# Patient Record
Sex: Female | Born: 1935
Health system: Southern US, Community
[De-identification: ages and names within clinical notes are randomized; demographics above are authoritative.]

## PROBLEM LIST (undated history)

## (undated) DIAGNOSIS — R32 Unspecified urinary incontinence: Secondary | ICD-10-CM

## (undated) DIAGNOSIS — N3281 Overactive bladder: Secondary | ICD-10-CM

## (undated) DIAGNOSIS — M48061 Spinal stenosis, lumbar region without neurogenic claudication: Secondary | ICD-10-CM

## (undated) DIAGNOSIS — I4510 Unspecified right bundle-branch block: Secondary | ICD-10-CM

## (undated) DIAGNOSIS — M1990 Unspecified osteoarthritis, unspecified site: Secondary | ICD-10-CM

## (undated) DIAGNOSIS — R21 Rash and other nonspecific skin eruption: Secondary | ICD-10-CM

## (undated) DIAGNOSIS — R413 Other amnesia: Secondary | ICD-10-CM

## (undated) DIAGNOSIS — G729 Myopathy, unspecified: Secondary | ICD-10-CM

## (undated) DIAGNOSIS — N393 Stress incontinence (female) (male): Secondary | ICD-10-CM

## (undated) DIAGNOSIS — R3915 Urgency of urination: Secondary | ICD-10-CM

## (undated) DIAGNOSIS — R252 Cramp and spasm: Secondary | ICD-10-CM

## (undated) DIAGNOSIS — K5900 Constipation, unspecified: Secondary | ICD-10-CM

## (undated) DIAGNOSIS — L821 Other seborrheic keratosis: Secondary | ICD-10-CM

## (undated) DIAGNOSIS — H8110 Benign paroxysmal vertigo, unspecified ear: Secondary | ICD-10-CM

## (undated) DIAGNOSIS — H9319 Tinnitus, unspecified ear: Secondary | ICD-10-CM

## (undated) DIAGNOSIS — R42 Dizziness and giddiness: Secondary | ICD-10-CM

## (undated) DIAGNOSIS — M899 Disorder of bone, unspecified: Secondary | ICD-10-CM

## (undated) DIAGNOSIS — B379 Candidiasis, unspecified: Secondary | ICD-10-CM

## (undated) DIAGNOSIS — R0789 Other chest pain: Secondary | ICD-10-CM

## (undated) DIAGNOSIS — M1710 Unilateral primary osteoarthritis, unspecified knee: Secondary | ICD-10-CM

## (undated) DIAGNOSIS — IMO0002 Reserved for concepts with insufficient information to code with codable children: Secondary | ICD-10-CM

## (undated) DIAGNOSIS — J301 Allergic rhinitis due to pollen: Secondary | ICD-10-CM

## (undated) DIAGNOSIS — R7309 Other abnormal glucose: Secondary | ICD-10-CM

## (undated) DIAGNOSIS — G4700 Insomnia, unspecified: Secondary | ICD-10-CM

## (undated) DIAGNOSIS — M62838 Other muscle spasm: Secondary | ICD-10-CM

## (undated) DIAGNOSIS — D649 Anemia, unspecified: Secondary | ICD-10-CM

## (undated) DIAGNOSIS — N63 Unspecified lump in unspecified breast: Secondary | ICD-10-CM

## (undated) DIAGNOSIS — M949 Disorder of cartilage, unspecified: Secondary | ICD-10-CM

## (undated) DIAGNOSIS — M948X9 Other specified disorders of cartilage, unspecified sites: Secondary | ICD-10-CM

## (undated) DIAGNOSIS — M25562 Pain in left knee: Secondary | ICD-10-CM

## (undated) DIAGNOSIS — R350 Frequency of micturition: Secondary | ICD-10-CM

## (undated) DIAGNOSIS — M545 Low back pain: Secondary | ICD-10-CM

## (undated) DIAGNOSIS — IMO0001 Reserved for inherently not codable concepts without codable children: Secondary | ICD-10-CM

## (undated) HISTORY — PX: CLEFT LIP REPAIR: SUR1164

## (undated) HISTORY — PX: EYE SURGERY: SHX253

## (undated) HISTORY — DX: Reserved for concepts with insufficient information to code with codable children: IMO0002

## (undated) HISTORY — PX: BACK SURGERY: SHX140

## (undated) HISTORY — DX: Unilateral primary osteoarthritis, unspecified knee: M17.10

## (undated) HISTORY — PX: BREAST SURGERY: SHX581

## (undated) HISTORY — DX: Myopathy, unspecified: G72.9

## (undated) HISTORY — DX: Other abnormal glucose: R73.09

## (undated) HISTORY — DX: Constipation, unspecified: K59.00

## (undated) HISTORY — DX: Benign paroxysmal vertigo, unspecified ear: H81.10

## (undated) HISTORY — DX: Tinnitus, unspecified ear: H93.19

## (undated) HISTORY — DX: Frequency of micturition: R35.0

## (undated) HISTORY — DX: Urgency of urination: R39.15

## (undated) HISTORY — DX: Stress incontinence (female) (male): N39.3

## (undated) HISTORY — DX: Disorder of cartilage, unspecified: M94.9

## (undated) HISTORY — DX: Unspecified urinary incontinence: R32

## (undated) HISTORY — PX: ABDOMINAL HYSTERECTOMY: SHX81

## (undated) HISTORY — DX: Allergic rhinitis due to pollen: J30.1

## (undated) HISTORY — DX: Other seborrheic keratosis: L82.1

## (undated) HISTORY — DX: Dizziness and giddiness: R42

## (undated) HISTORY — DX: Insomnia, unspecified: G47.00

## (undated) HISTORY — DX: Cramp and spasm: R25.2

## (undated) HISTORY — DX: Other amnesia: R41.3

## (undated) HISTORY — DX: Unspecified lump in unspecified breast: N63.0

## (undated) HISTORY — DX: Rash and other nonspecific skin eruption: R21

## (undated) HISTORY — DX: Unspecified right bundle-branch block: I45.10

## (undated) HISTORY — DX: Low back pain: M54.5

## (undated) HISTORY — DX: Pain in left knee: M25.562

## (undated) HISTORY — DX: Other muscle spasm: M62.838

## (undated) HISTORY — DX: Anemia, unspecified: D64.9

## (undated) HISTORY — DX: Candidiasis, unspecified: B37.9

## (undated) HISTORY — DX: Other chest pain: R07.89

## (undated) HISTORY — DX: Other specified disorders of cartilage, unspecified sites: M94.8X9

## (undated) HISTORY — DX: Reserved for inherently not codable concepts without codable children: IMO0001

## (undated) HISTORY — DX: Spinal stenosis, lumbar region without neurogenic claudication: M48.061

## (undated) HISTORY — DX: Disorder of bone, unspecified: M89.9

---
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
## 1991-08-28 HISTORY — PX: BREAST LUMPECTOMY: SHX2

## 1998-09-15 ENCOUNTER — Encounter: Admission: RE | Admit: 1998-09-15 | Discharge: 1998-12-14 | Payer: Self-pay | Admitting: *Deleted

## 1998-09-16 ENCOUNTER — Other Ambulatory Visit: Admission: RE | Admit: 1998-09-16 | Discharge: 1998-09-16 | Payer: Self-pay | Admitting: *Deleted

## 1999-02-01 ENCOUNTER — Other Ambulatory Visit: Admission: RE | Admit: 1999-02-01 | Discharge: 1999-02-01 | Payer: Self-pay | Admitting: *Deleted

## 1999-03-17 ENCOUNTER — Encounter: Payer: Self-pay | Admitting: Specialist

## 1999-03-17 ENCOUNTER — Ambulatory Visit (HOSPITAL_COMMUNITY): Admission: RE | Admit: 1999-03-17 | Discharge: 1999-03-17 | Payer: Self-pay | Admitting: Specialist

## 1999-04-03 ENCOUNTER — Encounter: Payer: Self-pay | Admitting: Specialist

## 1999-04-03 ENCOUNTER — Ambulatory Visit (HOSPITAL_COMMUNITY): Admission: RE | Admit: 1999-04-03 | Discharge: 1999-04-03 | Payer: Self-pay | Admitting: Specialist

## 1999-04-17 ENCOUNTER — Ambulatory Visit (HOSPITAL_COMMUNITY): Admission: RE | Admit: 1999-04-17 | Discharge: 1999-04-17 | Payer: Self-pay | Admitting: Specialist

## 1999-04-17 ENCOUNTER — Encounter: Payer: Self-pay | Admitting: Specialist

## 1999-09-26 ENCOUNTER — Encounter: Payer: Self-pay | Admitting: *Deleted

## 1999-09-26 ENCOUNTER — Encounter: Admission: RE | Admit: 1999-09-26 | Discharge: 1999-09-26 | Payer: Self-pay | Admitting: *Deleted

## 1999-10-10 ENCOUNTER — Encounter: Payer: Self-pay | Admitting: *Deleted

## 1999-10-10 ENCOUNTER — Encounter: Admission: RE | Admit: 1999-10-10 | Discharge: 1999-10-10 | Payer: Self-pay | Admitting: *Deleted

## 1999-10-24 ENCOUNTER — Ambulatory Visit (HOSPITAL_COMMUNITY): Admission: RE | Admit: 1999-10-24 | Discharge: 1999-10-24 | Payer: Self-pay | Admitting: Specialist

## 1999-10-24 ENCOUNTER — Encounter: Payer: Self-pay | Admitting: Specialist

## 1999-11-01 ENCOUNTER — Other Ambulatory Visit: Admission: RE | Admit: 1999-11-01 | Discharge: 1999-11-01 | Payer: Self-pay | Admitting: *Deleted

## 1999-11-08 ENCOUNTER — Ambulatory Visit (HOSPITAL_COMMUNITY): Admission: RE | Admit: 1999-11-08 | Discharge: 1999-11-08 | Payer: Self-pay | Admitting: Specialist

## 1999-11-08 ENCOUNTER — Encounter: Payer: Self-pay | Admitting: Specialist

## 1999-11-23 ENCOUNTER — Ambulatory Visit (HOSPITAL_COMMUNITY): Admission: RE | Admit: 1999-11-23 | Discharge: 1999-11-23 | Payer: Self-pay | Admitting: Specialist

## 1999-11-23 ENCOUNTER — Encounter: Payer: Self-pay | Admitting: Specialist

## 2000-10-17 ENCOUNTER — Encounter: Admission: RE | Admit: 2000-10-17 | Discharge: 2000-10-17 | Payer: Self-pay | Admitting: General Surgery

## 2000-10-17 ENCOUNTER — Encounter (INDEPENDENT_AMBULATORY_CARE_PROVIDER_SITE_OTHER): Payer: Self-pay | Admitting: *Deleted

## 2000-10-17 ENCOUNTER — Encounter: Payer: Self-pay | Admitting: General Surgery

## 2000-10-17 ENCOUNTER — Other Ambulatory Visit: Admission: RE | Admit: 2000-10-17 | Discharge: 2000-10-17 | Payer: Self-pay | Admitting: General Surgery

## 2003-05-12 ENCOUNTER — Encounter: Admission: RE | Admit: 2003-05-12 | Discharge: 2003-05-12 | Payer: Self-pay | Admitting: Specialist

## 2003-06-04 ENCOUNTER — Encounter: Admission: RE | Admit: 2003-06-04 | Discharge: 2003-06-04 | Payer: Self-pay | Admitting: Specialist

## 2003-06-04 ENCOUNTER — Encounter: Payer: Self-pay | Admitting: Specialist

## 2003-06-22 ENCOUNTER — Encounter: Admission: RE | Admit: 2003-06-22 | Discharge: 2003-06-22 | Payer: Self-pay | Admitting: Specialist

## 2003-09-27 ENCOUNTER — Encounter: Admission: RE | Admit: 2003-09-27 | Discharge: 2003-09-27 | Payer: Self-pay | Admitting: Specialist

## 2005-04-25 ENCOUNTER — Ambulatory Visit (HOSPITAL_COMMUNITY): Admission: RE | Admit: 2005-04-25 | Discharge: 2005-04-25 | Payer: Self-pay | Admitting: Gastroenterology

## 2005-06-12 ENCOUNTER — Encounter: Admission: RE | Admit: 2005-06-12 | Discharge: 2005-06-12 | Payer: Self-pay | Admitting: Specialist

## 2005-06-27 ENCOUNTER — Encounter: Admission: RE | Admit: 2005-06-27 | Discharge: 2005-06-27 | Payer: Self-pay | Admitting: Specialist

## 2005-08-13 ENCOUNTER — Encounter: Admission: RE | Admit: 2005-08-13 | Discharge: 2005-08-13 | Payer: Self-pay | Admitting: Internal Medicine

## 2005-08-17 ENCOUNTER — Encounter: Admission: RE | Admit: 2005-08-17 | Discharge: 2005-08-17 | Payer: Self-pay | Admitting: Specialist

## 2005-12-05 ENCOUNTER — Encounter: Admission: RE | Admit: 2005-12-05 | Discharge: 2005-12-05 | Payer: Self-pay | Admitting: Specialist

## 2006-07-08 ENCOUNTER — Encounter: Admission: RE | Admit: 2006-07-08 | Discharge: 2006-07-08 | Payer: Self-pay | Admitting: Specialist

## 2007-04-30 ENCOUNTER — Inpatient Hospital Stay (HOSPITAL_COMMUNITY): Admission: RE | Admit: 2007-04-30 | Discharge: 2007-05-04 | Payer: Self-pay | Admitting: Specialist

## 2009-07-01 DIAGNOSIS — M545 Low back pain, unspecified: Secondary | ICD-10-CM

## 2009-07-01 DIAGNOSIS — J301 Allergic rhinitis due to pollen: Secondary | ICD-10-CM

## 2009-07-01 HISTORY — DX: Allergic rhinitis due to pollen: J30.1

## 2009-07-01 HISTORY — DX: Low back pain, unspecified: M54.50

## 2009-10-13 ENCOUNTER — Encounter: Admission: RE | Admit: 2009-10-13 | Discharge: 2009-10-13 | Payer: Self-pay | Admitting: Specialist

## 2009-10-19 ENCOUNTER — Ambulatory Visit (HOSPITAL_BASED_OUTPATIENT_CLINIC_OR_DEPARTMENT_OTHER): Admission: RE | Admit: 2009-10-19 | Discharge: 2009-10-19 | Payer: Self-pay | Admitting: Specialist

## 2010-02-06 ENCOUNTER — Ambulatory Visit (HOSPITAL_BASED_OUTPATIENT_CLINIC_OR_DEPARTMENT_OTHER): Admission: RE | Admit: 2010-02-06 | Discharge: 2010-02-06 | Payer: Self-pay | Admitting: Specialist

## 2010-03-23 DIAGNOSIS — R7309 Other abnormal glucose: Secondary | ICD-10-CM

## 2010-03-23 HISTORY — DX: Other abnormal glucose: R73.09

## 2010-09-07 DIAGNOSIS — D649 Anemia, unspecified: Secondary | ICD-10-CM

## 2010-09-07 DIAGNOSIS — G4700 Insomnia, unspecified: Secondary | ICD-10-CM

## 2010-09-07 HISTORY — DX: Anemia, unspecified: D64.9

## 2010-09-07 HISTORY — DX: Insomnia, unspecified: G47.00

## 2010-11-10 DIAGNOSIS — M899 Disorder of bone, unspecified: Secondary | ICD-10-CM

## 2010-11-10 DIAGNOSIS — R42 Dizziness and giddiness: Secondary | ICD-10-CM

## 2010-11-10 HISTORY — DX: Dizziness and giddiness: R42

## 2010-11-10 HISTORY — DX: Disorder of bone, unspecified: M89.9

## 2010-11-13 LAB — POCT HEMOGLOBIN-HEMACUE: Hemoglobin: 12.3 g/dL (ref 12.0–15.0)

## 2010-11-15 LAB — POCT HEMOGLOBIN-HEMACUE: Hemoglobin: 13 g/dL (ref 12.0–15.0)

## 2011-01-09 NOTE — H&P (Signed)
NAME:  Janet Hernandez, SHERR NO.:  [id number]   MEDICAL RECORD NO.:  [id number]           PATIENT TYPE:   LOCATION:                                 FACILITY:   PHYSICIAN:  Jene Every, M.D.    DATE OF BIRTH:  Aug 15, 1930   DATE OF ADMISSION:  DATE OF DISCHARGE:                              HISTORY & PHYSICAL   CHIEF COMPLAINT:  Back and mainly right lower extremity pain with  occasional left leg pain.   HISTORY:  Janet Hernandez is a pleasant 77 year old female with a history of  longstanding low back pain.  She has been treated in the past with  multiple injections, physical therapy as well as manipulation which has  given her short-term relief.  She has now noted disabling symptoms,  mostly when she stands for extended periods of time.  She has undergone  multiple MRI studies which show degenerative spondylolisthesis at L4-5  with associated stenosis as well as facet arthropathy.  Dr. Shelle Iron felt  that the patient would benefit from decompression with instrumented  fusion.  She also had a second opinion with Dr. Venita Lick who agreed  with this.  The risks and benefits of this surgery were discussed with  the patient, medical clearance was obtained.  She does wish to proceed.   MEDICAL HISTORY:  Sent for hyperlipidemia, overactive bladder.   CURRENT MEDICATIONS:  1. Meloxicam 7.5 mg one p.o. daily,.  2. Vesicare 5 mg one p.o. daily.   ALLERGIES:  None.   PREVIOUS SURGICAL HISTORY INCLUDES:  1. Cleft palate repair in 1933.  2. Appendectomy in 1956.  3. Removal of fallopian tube and ovary in 1979.  4. Lumpectomy of the breast in 1993 which was benign.   SOCIAL HISTORY:  The patient is divorced.  She is retired.  She denies  any tobacco consumption, drinks occasional wine.  She has three sons.  She lives in the 3-story town home with two sets of stairs.  Her friend  will take care of her at time of discharge, allowing her stay at her  house.   PRIMARY CARE  PHYSICIAN:  Ralene Ok, M.D.   FAMILY HISTORY:  Mother lived until she was 5 years old, died of old  age.   REVIEW OF SYSTEMS:  GENERAL: The patient denies any fever, chills, night  sweats, or bleeding tendencies.  CENTRAL NERVOUS SYSTEM:  No blurred  double vision, seizure, headache or paralysis.  RESPIRATORY: No  shortness of breath, productive cough or hemoptysis.  CARDIOVASCULAR:  No chest pain, angina, nor orthopnea.  GASTROINTESTINAL:  No nausea,  vomiting, constipation, melena, nor bloody stools.  GENITOURINARY:  The  patient does note urinary incontinence.  She has no urinary frequency as  well as excess urination at night.  She denies any burning or change in  these symptoms.  MUSCULOSKELETAL:  Pertinent to HPI.   PHYSICAL EXAM:  VITAL SIGNS:  Pulse 60, respiratory rate 16, BP is  122/88.  Weight is 172.  GENERAL:  This is a well-developed female seen upright in no acute  distress.  She walks with a  slightly antalgic gait.  HEENT: Atraumatic, normocephalic.  Pupils equal round and reactive to  light.  EOMs intact.  NECK:  Supple.  No lymphadenopathy.  CHEST:  Clear to auscultation bilaterally.  No rhonchi, wheezes or  rales.  BREASTS/GENITOURINARY:  Not examined, per HPI.  HEART:  Regular rate and rhythm without murmurs, gallops or rubs.  ABDOMEN:  Soft, nontender, nondistended.  Bowel sounds x4.  SKIN:  No rashes or lesions are noted.  BACK:  The patient does have pain with forward flexion and extension of  the lumbar spine.  She can straight leg raise bilaterally; does produce  low back pain, measures 5/5.   IMPRESSION:  Degenerative spondylolisthesis at L4-5 was associated  facette arthropathy.   PLAN:  The patient will be admitted to [facility] to undergo  lumbar decompression and fusion at L4-5 with facette set screw fixation.      Roma Schanz, P.A.      Jene Every, M.D.  Electronically Signed    CS/MEDQ  D:  04/23/2007  T:   04/24/2007  Job:  [id number]

## 2011-01-09 NOTE — Op Note (Signed)
NAME:  Janet Hernandez, Janet Hernandez NO.:  [id number]   MEDICAL RECORD NO.:  [id number]          PATIENT TYPE:  INP   LOCATION:  X003                         FACILITY:  [facility]   PHYSICIAN:  Jene Every, M.D.    DATE OF BIRTH:  1930/03/20   DATE OF PROCEDURE:  04/24/2007  DATE OF DISCHARGE:                               OPERATIVE REPORT   PREOPERATIVE DIAGNOSES:  Spinal stenosis, spinal listhesis at L4-5.   POSTOPERATIVE DIAGNOSES:  Spinal stenosis, spinal listhesis at L4-5.   PROCEDURE:  1. Central decompression by L4 laminectomy and partial L5 laminectomy.  2. Posterolateral fusion utilizing autologous and active fused bone      graft.  3. Posterior instrumentation at L4-5 facet bilaterally utilizing L4-5      facet screws with autologous and allograft bone graft.   ANESTHESIA:  General.   ASSISTANT:  Dahari D. Shon Baton, MD.   BRIEF HISTORY:  This is a 77 year old female with neurogenic  claudication secondary to spinal stenosis, spinal listhesis,  predominantly back pain and claudication. She had been refractory to  conservative treatment including epidural steroid injection and activity  modification. She had leg pain and mechanical back pain and spinal  listhesis. She was indicated for her decompression by central  laminectomy and then we discussed stabilization of the  L4-5 facets with  facet screws and lateral mass bone graft. I did not feel that a TLIF  pedicle screw instrumentation was necessary at this point and that  following the decompression __facet________ fixation and lateral mass  fusion would be appropriate for this 77 year old. The risks and benefits  were discussed including bleeding, infection, injury to neurovascular  structures, CSF leakage, epidural fibrosis, adjacent segment disease and  need for fusion in the future,  anesthetic complications, etc.   TECHNIQUE:  The patient in the supine position and after the  administration of adequate general  anesthesia and 1 gram of Kefzol, she  was placed prone on the Wilson frame, [place] table, lumbar region  prepped and draped in the usual sterile fashion under C-arm. We  localized the 4-5 space with a spinal needle and made an incision after  appropriately prepping and draping. From the spinous process of 3 to  beneath the spinous process of 5, the subcutaneous tissue was dissected,  electrocautery was utilized to achieve hemostasis. The dorsolumbar  fascia identified, divided in line with the skin incision. The  paraspinous muscle elevated from the lamina of 4, 5 and partially of 3.  We confirmed this space with a Kocher on the spinal stenosis of 4. First  we removed the spinal stenosis of 4 and partially of 5 and morcellized  the cancellous bone for later bone graft. Following this, we draped the  operating microscope and brought it into the surgical field and  performed a central decompression by hemilaminectomy with a 2 and 3 mm  Kerrison. She had severe stenosis centrally and laterally. She had  significant attenuation of the thecal sac with adhesions. This required  a meticulous dissection and decompression to avoid a dural rent. We  decompressed gently to the medial border of  the pedicle preserving the  majority of the lateral aspect of the facet. We performed foraminotomies  of L5. Facet arthropathy was noted, facet synovial cysts were extending  into the foramen, these were excised as well particularly on the left.  The facets were decapsulated, curetted for apparent bed for bone  grafting. We identified the spinous processes of 4 and 5, these were  curetted as well as the pars. The pars was conserved. We used the  operating microscope when we were performing this after foraminotomies  with the hockey stick probe. This passed freely in the foramen of 4 and  5. There was no disk herniation noted, good restoration of the thecal  sac, no evidence of CSF leakage. Bipolar  electrocautery was utilized to  achieve hemostasis. The neural elements were well protected and a  satisfactory decompression. We placed autologous and active fused bone  graft into the lateral masses and they were packed accordingly and also  into the facet joints. We then proceeded with a facet screw fixation.  Under x-ray, a facet screw was place from cephalad to caudad, medial to  slightly lateral under x-ray directly into the pedicle through the  facet. A 35-mm screw was utilized. After this was drilled with a  guidepin and overdrilled with a cannulated drill to a 30 and then we  probed the drill hole __to________ drill bit  depth. Also  __placed________ probe out __into________  foramen of 4 and 5 with no  breakthrough appreciated. The 30 mm __________  screws with the  appropriate washings were then advanced first on the right side with  excellent purchase with correction of the facet with excretion of  hematoma and __allo________  bone graft performed similarly on the  contralateral side. Began probing the drill hole __________ bone.  Examined the pedicle above and below the foramen __________ vertex of  the screw that was inserted, again excellent purchase here, excretion of  hematoma and tapping of the bone graft. Exam of the __pars________  was  found to be intact after decompression of the AP and lateral planes with  excellent placement of the screws. A hockey stick probe placed in the  foramen of 5 and 4. They are widely patent, there was no residual  lateral residual stenosis noted, no ___epidural_______ direct bleeding.  We copiously irrigated the wound with antibiotic irrigation. There was a  Hemovac brought out through a lateral stab wound in the skin and  thrombin soaked Gelfoam was placed in the laminotomy defect.  Electrocautery was utilized to achieve hemostasis. The dorsolumbar  fascia was reapproximated with #1 Vicryl in a figure-of-eight suture.  The subcutaneous  tissue was reapproximated with 2-0 Vicryl simple  suture, skin was reapproximated with staples. The wound was dressed  sterilely. She was placed supine on the hospital bed, extubated without  difficulty and transported to the recovery room in satisfactory  condition.   The patient tolerated the procedure well, there were no apparent  complications. Blood loss during this procedure was 300 mL.      Jene Every, M.D.  Electronically Signed     JB/MEDQ  D:  04/30/2007  T:  04/30/2007  Job:  [id number]

## 2011-01-11 DIAGNOSIS — R252 Cramp and spasm: Secondary | ICD-10-CM

## 2011-01-11 DIAGNOSIS — M62838 Other muscle spasm: Secondary | ICD-10-CM

## 2011-01-11 HISTORY — DX: Cramp and spasm: R25.2

## 2011-01-11 HISTORY — DX: Other muscle spasm: M62.838

## 2011-01-12 NOTE — Discharge Summary (Signed)
NAME:  Janet Hernandez, Janet Hernandez             ACCOUNT NO.:  [id number]   MEDICAL RECORD NO.:  [id number]          PATIENT TYPE:  INP   LOCATION:  1526                         FACILITY:  [facility]   PHYSICIAN:  Jene Every, M.D.    DATE OF BIRTH:  1930/05/26   DATE OF ADMISSION:  04/30/2007  DATE OF DISCHARGE:  05/04/2007                               DISCHARGE SUMMARY   ADMISSION DIAGNOSES:  1. Degenerative spondylolisthesis, L4-5, with facet arthroscopy.  2. Hyperlipidemia.  3. Overactive bladder.   DISCHARGE DIAGNOSES:  1. Degenerative spondylolisthesis, L4-5, with facet arthroscopy,      status post decompression at the fixation at L4-5.  2. Hyperlipidemia.  3. Overactive bladder.   HISTORY:  Janet Hernandez is a pleasant 77 year old female who is a  longstanding patient to our practice.  She has been treated over the  years for complaints of back and lower extremity pain with injections as  well as physical therapy.  She has also had chiropractor manipulation,  which gave her short-term relief of her symptoms.  She states at this  point the pain has become disabling, more when she stands for extended  periods of time.  MRI study does show degenerative spondylolisthesis at  L4-5 with associated stenosis as well as facet arthropathy.  It is felt  by Dr. Jillyn Hidden as well as Dr. Venita Lick that she would benefit from a  decompression and facet fusion.  The risks and benefits of this were  discussed, medical clearance was obtained.  The patient does elect to  proceed.   PROCEDURES:  The patient was taken to the OR on September 3 to undergo  decompression with facet fusion at L4-5 with allograft and autograft  bone.  Surgeon:  Jene Every, MD.  Assistant:  Donn Pierini. Shon Baton, MD.  Anesthesia:  General.  Complications:  None.   CONSULTANT:  PT, OT, case management.   LABORATORY VALUES:  Preoperative CBC showed a white cell count of 7.3,  hemoglobin 14.6, hematocrit 42.5.  These were monitored  throughout the  hospital course and remained within normal range.  Hemoglobin at time of  discharge was 12.5, hematocrit 36.2.  Coagulation studies done  preoperatively showed PT of 12.7, INR of 0.9.  Routine chemistries done  preoperatively showed sodium 140, potassium 4.5, glucose of 111 and BUN  of 25 with creatinine 0.78.  This was repeated postoperatively.  Sodium  remained normal at 138, potassium 3.8, glucose slightly elevated at 120,  BUN was normal at 7, creatinine remained stable at 0.71.  Preoperative  urinalysis was negative.  Blood type was O+.  Preoperative EKG showed  normal sinus rhythm.  There was noted incomplete right bundle branch  block.  Chest x-ray preoperatively showed no acute findings.   HOSPITAL COURSE:  The patient was admitted, taken to the operating room,  and underwent the above-stated procedure without difficulty.  She was  then transferred to the PACU eventually to the orthopedic floor for  postoperative care.  Intraoperatively one Hemovac drain was placed.  The  patient was placed on PCA analgesics for pain relief.  On postop day #1  the patient was doing fairly well.  She noted decreased lower extremity  pain, low back pain as expected.  Hemovac was continued secondary to  persistent drainage.  Lab values within normal range.  Foley remained in  place.  The patient was voiding without difficulty and the patient did  fairly well with physical therapy.  Postop day #2 the patient was doing  better.  She was, however, complaining of some nausea secondary to pain  medication.  PCA was discontinued.  She was voiding without difficulty.  She did note flatus.  Diet had been advanced to clear liquid.  Hemovac  was discontinued.  Dressing was changed.  Neurovascular and motor  function remained intact to the lower extremities and PT, OT was  continued.  Discharge planning was initiated.  The patient continued to  do fairly well.  She did note complaints of some  right knee pain.  She  has a known history of osteoarthritis and this was treated  conservatively.  As far as her back, she continued to progress with  physical therapy.  Postop day #4 it was felt the patient was stable to  be discharged home.  Pain was controlled.  She was passing flatus  without difficulty.  All home health arrangements had been made.   DISPOSITION:  The patient discharged home with home health PT, OT and  all equipment needs.  She is to follow up with Dr. Shelle Iron in  approximately 10-14 days for x-ray as well as suture removal.   WOUND CARE:  She should change her dressing daily.  It is okay for her  to shower in 72 hours.   ACTIVITY:  She is to walk as tolerated utilizing her walker.  No  bending, twisting or lifting, no driving for 2 weeks, no sexual activity  for 2-4 weeks.   DIET:  As tolerated.   DISCHARGE MEDICATIONS:  1. Vitamin C 500 mg daily.  2. Vicodin one to two p.o. q.4-6h. p.r.n. pain.  3. Mobic as needed.   The patient is to utilize her brace when walking, avoid extension.   CONDITION ON DISCHARGE:  Stable.   FINAL DIAGNOSIS:  Doing well status post decompression and facet fusion  at L4-5.      Roma Schanz, P.A.      Jene Every, M.D.  Electronically Signed    CS/MEDQ  D:  05/15/2007  T:  05/15/2007  Job:  [id number]

## 2011-01-12 NOTE — Op Note (Signed)
NAME:  Janet Hernandez, Janet Hernandez             ACCOUNT NO.:  [id number]   MEDICAL RECORD NO.:  [id number]          PATIENT TYPE:  AMB   LOCATION:  ENDO                         FACILITY:  [facility]   PHYSICIAN:  James L. Malon Kindle., M.D.DATE OF BIRTH:  11-20-1929   DATE OF PROCEDURE:  04/25/2005  DATE OF DISCHARGE:                                 OPERATIVE REPORT   PROCEDURE:  Colonoscopy.   MEDICATIONS:  Fentanyl 50 mcg, Versed 5 mg IV.   INDICATIONS FOR PROCEDURE:  Colon cancer screening.   DESCRIPTION OF PROCEDURE:  The procedure explained to the patient including  the risks and benefits, consent obtained. In the left lateral decubitus  position, the pediatric adjustable scope was inserted and advanced. The  patient had a somewhat  long tortuous colon. Position changes and abdominal  pressure were required and we were eventually able to reach the cecum,  ileocecal valve and appendiceal orifice seen. The scope withdrawn, the colon  carefully examined on withdrawal. The mucosal pattern was normal throughout.  There were only scattered diverticula in the sigmoid colon. No polyps were  seen throughout. The rectum was free of any significant lesions. The scope  was withdrawn. The patient tolerated the procedure well.   ASSESSMENT:  Normal screening colonoscopy, V76.51.   PLAN:  Will recommend yearly Hemoccult's and possibly repeat colonoscopy in  10 years depending on clinical condition.           ______________________________  Llana Aliment Malon Kindle., M.D.     Waldron Session  D:  04/25/2005  T:  04/25/2005  Job:  [id number]   cc:   Wilson Singer, M.D.  [address]  [place]  [place] [address]  Fax: [phone]

## 2011-05-17 DIAGNOSIS — L821 Other seborrheic keratosis: Secondary | ICD-10-CM

## 2011-05-17 DIAGNOSIS — IMO0001 Reserved for inherently not codable concepts without codable children: Secondary | ICD-10-CM

## 2011-05-17 HISTORY — DX: Reserved for inherently not codable concepts without codable children: IMO0001

## 2011-05-17 HISTORY — DX: Other seborrheic keratosis: L82.1

## 2011-06-08 LAB — CBC
HCT: 42.5
Hemoglobin: 14.6
MCV: 84.7
MCV: 84.4
RBC: 4.28
RBC: 5.03
WBC: 9.5
WBC: 7.3

## 2011-06-08 LAB — APTT: aPTT: 21 — ABNORMAL LOW

## 2011-06-08 LAB — URINALYSIS, ROUTINE W REFLEX MICROSCOPIC
Glucose, UA: NEGATIVE
Ketones, ur: NEGATIVE
Specific Gravity, Urine: 1.027
pH: 6

## 2011-06-08 LAB — BASIC METABOLIC PANEL
BUN: 25 — ABNORMAL HIGH
Chloride: 107
Chloride: 109
Creatinine, Ser: 0.71
GFR calc Af Amer: >60
GFR calc Af Amer: >60
Potassium: 3.8
Potassium: 4.5
Sodium: 140

## 2011-06-08 LAB — TYPE AND SCREEN
ABO/RH(D): O POS
Antibody Screen: NEGATIVE

## 2011-06-08 LAB — HEMOGLOBIN AND HEMATOCRIT, BLOOD: HCT: 35.5 — ABNORMAL LOW

## 2011-06-08 LAB — ABO/RH: ABO/RH(D): O POS

## 2011-07-26 ENCOUNTER — Other Ambulatory Visit: Payer: Self-pay | Admitting: Internal Medicine

## 2011-07-26 DIAGNOSIS — R252 Cramp and spasm: Secondary | ICD-10-CM

## 2011-08-03 ENCOUNTER — Ambulatory Visit
Admission: RE | Admit: 2011-08-03 | Discharge: 2011-08-03 | Disposition: A | Discharge status: Home / Self Care | Payer: Medicare Other | Source: Ambulatory Visit | Attending: Internal Medicine | Admitting: Internal Medicine

## 2011-08-03 DIAGNOSIS — R252 Cramp and spasm: Secondary | ICD-10-CM

## 2011-08-29 DIAGNOSIS — M1710 Unilateral primary osteoarthritis, unspecified knee: Secondary | ICD-10-CM | POA: Diagnosis not present

## 2011-08-30 DIAGNOSIS — L821 Other seborrheic keratosis: Secondary | ICD-10-CM | POA: Diagnosis not present

## 2011-08-30 DIAGNOSIS — M1710 Unilateral primary osteoarthritis, unspecified knee: Secondary | ICD-10-CM | POA: Diagnosis not present

## 2011-08-30 DIAGNOSIS — R32 Unspecified urinary incontinence: Secondary | ICD-10-CM | POA: Diagnosis not present

## 2011-09-04 DIAGNOSIS — R32 Unspecified urinary incontinence: Secondary | ICD-10-CM | POA: Diagnosis not present

## 2011-09-04 DIAGNOSIS — IMO0001 Reserved for inherently not codable concepts without codable children: Secondary | ICD-10-CM | POA: Diagnosis not present

## 2011-09-04 DIAGNOSIS — M795 Residual foreign body in soft tissue: Secondary | ICD-10-CM | POA: Diagnosis not present

## 2011-09-04 DIAGNOSIS — D649 Anemia, unspecified: Secondary | ICD-10-CM | POA: Diagnosis not present

## 2011-09-04 DIAGNOSIS — R7989 Other specified abnormal findings of blood chemistry: Secondary | ICD-10-CM | POA: Diagnosis not present

## 2011-09-20 DIAGNOSIS — R252 Cramp and spasm: Secondary | ICD-10-CM | POA: Diagnosis not present

## 2011-09-20 DIAGNOSIS — R32 Unspecified urinary incontinence: Secondary | ICD-10-CM | POA: Diagnosis not present

## 2011-09-20 DIAGNOSIS — M1990 Unspecified osteoarthritis, unspecified site: Secondary | ICD-10-CM | POA: Diagnosis not present

## 2011-10-02 DIAGNOSIS — M1710 Unilateral primary osteoarthritis, unspecified knee: Secondary | ICD-10-CM | POA: Diagnosis not present

## 2011-10-03 DIAGNOSIS — M545 Low back pain: Secondary | ICD-10-CM | POA: Diagnosis not present

## 2011-10-03 DIAGNOSIS — R7989 Other specified abnormal findings of blood chemistry: Secondary | ICD-10-CM | POA: Diagnosis not present

## 2011-10-03 DIAGNOSIS — M1710 Unilateral primary osteoarthritis, unspecified knee: Secondary | ICD-10-CM | POA: Diagnosis not present

## 2011-10-03 DIAGNOSIS — R32 Unspecified urinary incontinence: Secondary | ICD-10-CM | POA: Diagnosis not present

## 2011-10-03 DIAGNOSIS — Z23 Encounter for immunization: Secondary | ICD-10-CM | POA: Diagnosis not present

## 2011-10-03 DIAGNOSIS — I4510 Unspecified right bundle-branch block: Secondary | ICD-10-CM | POA: Diagnosis not present

## 2011-10-03 DIAGNOSIS — IMO0002 Reserved for concepts with insufficient information to code with codable children: Secondary | ICD-10-CM | POA: Diagnosis not present

## 2011-10-08 ENCOUNTER — Other Ambulatory Visit: Payer: Self-pay | Admitting: Otolaryngology

## 2011-10-10 DIAGNOSIS — D649 Anemia, unspecified: Secondary | ICD-10-CM | POA: Diagnosis not present

## 2011-10-11 DIAGNOSIS — D649 Anemia, unspecified: Secondary | ICD-10-CM | POA: Diagnosis not present

## 2011-10-25 DIAGNOSIS — D649 Anemia, unspecified: Secondary | ICD-10-CM | POA: Diagnosis not present

## 2011-10-25 DIAGNOSIS — R1110 Vomiting, unspecified: Secondary | ICD-10-CM | POA: Diagnosis not present

## 2011-10-25 DIAGNOSIS — M1710 Unilateral primary osteoarthritis, unspecified knee: Secondary | ICD-10-CM | POA: Diagnosis not present

## 2011-10-25 DIAGNOSIS — R1013 Epigastric pain: Secondary | ICD-10-CM | POA: Diagnosis not present

## 2011-11-21 DIAGNOSIS — M1710 Unilateral primary osteoarthritis, unspecified knee: Secondary | ICD-10-CM | POA: Diagnosis not present

## 2011-11-21 NOTE — H&P (Signed)
Chief Complaint:  left knee pain.  Subjective: Patient is admitted for left total knee arthroplasty.  Patient is a 78 y.o. female who has been seen by Dr. Shelle Iron for ongoing hip pain.  They have been followed and found to have continued progressive pain.  They have been treated conservatively in the past including medications.  Despite conservative measures, they continue to have pain.  X-rays show that the patient has bone on bone arthritis of the left knee.  It is felt that they would benefit from undergoing surgical intervention.  Risks and benefits have been discussed with the patient and they elect to proceed with surgery.  The patient has no contraindications to the upcoming procedure such as ongoing infection or progressive neurological disease.  Allergies: NKDA  Medications: Mobic (7.5MG  Tablet, 1 Oral 1 tab po q day pc, Hydrocodone-Acetaminophen (5-325MG  Tablet, Oral) Active. (prn) Meclizine HCl  Metaxalone   Past Medical History: Vertigo Early  Dementia Arthritis  Past Surgical History: Appendectomy Bilateral knee arthroscopies   Family History: Non contributory  Social History: Alcohol use. current drinker; drinks wine; only occasionally per week Children. 3 Current work status. retired Financial planner (Currently). no Drug/Alcohol Rehab (Previously). no Exercise. Exercises rarely; does running / walking Illicit drug use. no Living situation. live in a facility Marital status. divorced Tobacco / smoke exposure. no Tobacco use. Never smoker. never smoke    Review of Systems  General:Not Present- Chills, Fever, Night Sweats, Appetite Loss, Fatigue, Feeling sick, Weight Gain and Weight Loss. Skin:Not Present- Itching, Rash, Skin Color Changes, Ulcer, Psoriasis and Change in Hair or Nails. HEENT:Not Present- Sensitivity to light, Nose Bleed, Visual Loss, Decreased Hearing and Ringing in the Ears. Neck:Not Present- Swollen Glands and Neck  Mass. Respiratory:Not Present- Shortness of breath, Snoring, Chronic Cough and Bloody sputum. Cardiovascular:Not Present- Shortness of Breath, Chest Pain, Swelling of Extremities, Leg Cramps and Palpitations. Gastrointestinal:Not Present- Bloody Stool, Heartburn, Abdominal Pain, Vomiting, Nausea and Incontinence of Stool. Female Genitourinary:Present- Frequency and Incontinence. Not Present- Blood in Urine, Irregular/missing periods and Nocturia. Musculoskeletal:Present- Muscle Pain, Joint Stiffness and Joint Pain. Not Present- Muscle Weakness, Joint Swelling and Back Pain. Neurological:Not Present- Tingling, Numbness, Burning, Tremor, Headaches and Dizziness. Psychiatric:Not Present- Anxiety, Depression and Memory Loss. Endocrine:Not Present- Cold Intolerance, Heat Intolerance, Excessive hunger and Excessive Thirst. Hematology:Not Present- Abnormal Bleeding, Abnormal bruising, Anemia and Blood Clots. Physical Exam:  Vitals: Pulse:81 Respirations:12 Blood Pressure:146/77   There were no vitals taken for this visit.  General Appearance:    Alert, cooperative, no distress, appears stated age, antalgic gait  Head:    Normocephalic, without obvious abnormality, atraumatic  Eyes:    PERRL, conjunctiva/corneas clear, EOM's intact, fundi    benign, both eyes           Neck:   Supple, symmetrical, trachea midline, no adenopathy;    thyroid:  no enlargement/tenderness/nodules; no carotid   bruit or JVD     Lungs:     Clear to auscultation bilaterally, respirations unlabored  Chest Wall:    No tenderness or deformity   Heart:    Regular rate and rhythm, S1 and S2 normal, no murmur, rub   or gallop     Abdomen:     Soft, non-tender, bowel sounds active all four quadrants,    no masses, no organomegaly        Extremities:   Weak quads bilaterally, TTP medial joint line, mild effusion left knee 0-110  Pulses:   2+ and symmetric all extremities  Skin:  Skin color, texture, turgor  normal, no rashes or lesions          Assessment/Plan: End stage arthritis, left knee  The patient is being admitted to [facility] to undergo a left total knee arthroplasty.  Surgery will be performed by Dr. Jene Every.  Risks and benefits have been discussed with the patient and they elect to proceed wth the procedure.

## 2011-11-28 ENCOUNTER — Encounter (HOSPITAL_COMMUNITY): Payer: Self-pay | Admitting: Pharmacy Technician

## 2011-12-04 ENCOUNTER — Encounter (HOSPITAL_COMMUNITY): Payer: Self-pay

## 2011-12-04 ENCOUNTER — Encounter (HOSPITAL_COMMUNITY)
Admission: RE | Admit: 2011-12-04 | Discharge: 2011-12-04 | Disposition: A | Discharge status: Home / Self Care | Payer: Medicare Other | Source: Ambulatory Visit | Attending: Specialist | Admitting: Specialist

## 2011-12-04 ENCOUNTER — Other Ambulatory Visit: Payer: Self-pay | Admitting: Otolaryngology

## 2011-12-04 ENCOUNTER — Ambulatory Visit (HOSPITAL_COMMUNITY)
Admission: RE | Admit: 2011-12-04 | Discharge: 2011-12-04 | Disposition: A | Discharge status: Home / Self Care | Payer: Medicare Other | Source: Ambulatory Visit | Attending: Otolaryngology | Admitting: Otolaryngology

## 2011-12-04 DIAGNOSIS — M1710 Unilateral primary osteoarthritis, unspecified knee: Secondary | ICD-10-CM | POA: Diagnosis not present

## 2011-12-04 DIAGNOSIS — Z01812 Encounter for preprocedural laboratory examination: Secondary | ICD-10-CM | POA: Insufficient documentation

## 2011-12-04 DIAGNOSIS — M25869 Other specified joint disorders, unspecified knee: Secondary | ICD-10-CM | POA: Diagnosis not present

## 2011-12-04 DIAGNOSIS — Z01818 Encounter for other preprocedural examination: Secondary | ICD-10-CM | POA: Diagnosis not present

## 2011-12-04 HISTORY — DX: Overactive bladder: N32.81

## 2011-12-04 HISTORY — DX: Rash and other nonspecific skin eruption: R21

## 2011-12-04 HISTORY — DX: Unspecified osteoarthritis, unspecified site: M19.90

## 2011-12-04 LAB — CBC
Hemoglobin: 13.4 g/dL (ref 12.0–15.0)
MCH: 25.7 pg — ABNORMAL LOW (ref 26.0–34.0)
RBC: 5.21 MIL/uL — ABNORMAL HIGH (ref 3.87–5.11)
WBC: 7.2 10*3/uL (ref 4.0–10.5)

## 2011-12-04 LAB — DIFFERENTIAL
Basophils Absolute: 0.1 10*3/uL (ref 0.0–0.1)
Eosinophils Absolute: 0.2 10*3/uL (ref 0.0–0.7)
Lymphs Abs: 1.7 10*3/uL (ref 0.7–4.0)
Monocytes Absolute: 0.4 10*3/uL (ref 0.1–1.0)
Neutro Abs: 4.8 10*3/uL (ref 1.7–7.7)

## 2011-12-04 LAB — COMPREHENSIVE METABOLIC PANEL
ALT: 16 U/L (ref 0–35)
AST: 22 U/L (ref 0–37)
Alkaline Phosphatase: 94 U/L (ref 39–117)
CO2: 27 mEq/L (ref 19–32)
Calcium: 10.1 mg/dL (ref 8.4–10.5)
Chloride: 103 mEq/L (ref 96–112)
GFR calc Af Amer: 89 mL/min — ABNORMAL LOW (ref >90)
GFR calc non Af Amer: 77 mL/min — ABNORMAL LOW (ref >90)
Glucose, Bld: 93 mg/dL (ref 70–99)
Potassium: 4 mEq/L (ref 3.5–5.1)
Sodium: 139 mEq/L (ref 135–145)
Total Bilirubin: 0.3 mg/dL (ref 0.3–1.2)

## 2011-12-04 LAB — SURGICAL PCR SCREEN: MRSA, PCR: NEGATIVE

## 2011-12-04 LAB — URINE MICROSCOPIC-ADD ON

## 2011-12-04 LAB — URINALYSIS, ROUTINE W REFLEX MICROSCOPIC
Glucose, UA: NEGATIVE mg/dL
Hgb urine dipstick: NEGATIVE
Ketones, ur: NEGATIVE mg/dL
Protein, ur: NEGATIVE mg/dL
pH: 6.5 (ref 5.0–8.0)

## 2011-12-04 LAB — APTT: aPTT: 27 seconds (ref 24–37)

## 2011-12-04 MED ORDER — CHLORHEXIDINE GLUCONATE 4 % EX LIQD
60.0000 mL | Freq: Once | CUTANEOUS | Status: DC
  Filled 2011-12-04: qty 60

## 2011-12-04 NOTE — Pre-Procedure Instructions (Signed)
States had ? Reaction to a medicine but was not confirmed as to what. States is improving and  is inner thighs under the skin-  Instructed if not completely healed by end of week to see PCP Dr Chilton Si

## 2011-12-04 NOTE — Pre-Procedure Instructions (Signed)
Notified Doroteo Bradford Pa at Dr Cecile Hearing office of abnormal urine with micro

## 2011-12-04 NOTE — Patient Instructions (Addendum)
20 Janet Hernandez  12/04/2011   Your procedure is scheduled on:  12/13/11   Thursday  Surgery [id number]  Report to [facility] at  0515     AM.  Call this number if you have problems the morning of surgery: [phone]     Or PST   [phone]  [facility]   Remember:   Do not eat food:After Midnight. Wednesday NIGHT  May have clear liquids: until midnight Wednesday NIGHT  Clear liquids include soda, tea, black coffee, apple or grape juice, broth.  Take these medicines the morning of surgery with A SIP OF WATER: HYDROCODONE IF NEEDED   Do not wear jewelry, make-up or nail polish.  Do not wear lotions, powders, or perfumes. You may wear deodorant.  Do not shave 48 hours prior to surgery.  Do not bring valuables to the hospital.  Contacts, dentures or bridgework may not be worn into surgery.  Leave suitcase in the car. After surgery it may be brought to your room.  For patients admitted to the hospital, checkout time is 11:00 AM the day of discharge.   Patients discharged the day of surgery will not be allowed to drive home.  Name and phone number of your driver:   Son   Janet Hernandez                                                                   Special Instructions: CHG Shower Use Special Wash: 1/2 bottle night before surgery and 1/2 bottle morning of surgery. REGULAR SOAP FACE AND PRIVATES              LADIES- NO SHAVING 48 HOURS BEFORE USING BETASEPT SOAP.                   Please read over the following fact sheets that you were given: MRSA Information

## 2011-12-13 ENCOUNTER — Ambulatory Visit (HOSPITAL_COMMUNITY): Payer: Medicare Other | Admitting: Anesthesiology

## 2011-12-13 ENCOUNTER — Encounter (HOSPITAL_COMMUNITY): Payer: Self-pay | Admitting: *Deleted

## 2011-12-13 ENCOUNTER — Inpatient Hospital Stay (HOSPITAL_COMMUNITY)
Admission: RE | Admit: 2011-12-13 | Discharge: 2011-12-17 | DRG: 470 | Disposition: A | Discharge status: Skilled Nursing Facility | Payer: Medicare Other | Source: Ambulatory Visit | Attending: Specialist | Admitting: Specialist

## 2011-12-13 ENCOUNTER — Ambulatory Visit (HOSPITAL_COMMUNITY): Payer: Medicare Other

## 2011-12-13 ENCOUNTER — Encounter (HOSPITAL_COMMUNITY)
Admission: RE | Disposition: A | Discharge status: Skilled Nursing Facility | Payer: Self-pay | Source: Ambulatory Visit | Attending: Specialist

## 2011-12-13 ENCOUNTER — Encounter (HOSPITAL_COMMUNITY): Payer: Self-pay | Admitting: Anesthesiology

## 2011-12-13 DIAGNOSIS — M1710 Unilateral primary osteoarthritis, unspecified knee: Principal | ICD-10-CM | POA: Diagnosis present

## 2011-12-13 DIAGNOSIS — M25569 Pain in unspecified knee: Secondary | ICD-10-CM | POA: Diagnosis not present

## 2011-12-13 DIAGNOSIS — S8990XA Unspecified injury of unspecified lower leg, initial encounter: Secondary | ICD-10-CM | POA: Diagnosis not present

## 2011-12-13 DIAGNOSIS — IMO0002 Reserved for concepts with insufficient information to code with codable children: Secondary | ICD-10-CM | POA: Diagnosis not present

## 2011-12-13 DIAGNOSIS — M1990 Unspecified osteoarthritis, unspecified site: Secondary | ICD-10-CM

## 2011-12-13 DIAGNOSIS — Z96659 Presence of unspecified artificial knee joint: Secondary | ICD-10-CM | POA: Diagnosis not present

## 2011-12-13 DIAGNOSIS — Z471 Aftercare following joint replacement surgery: Secondary | ICD-10-CM | POA: Diagnosis not present

## 2011-12-13 HISTORY — PX: TOTAL KNEE ARTHROPLASTY: SHX125

## 2011-12-13 LAB — TYPE AND SCREEN: Antibody Screen: NEGATIVE

## 2011-12-13 SURGERY — ARTHROPLASTY, KNEE, TOTAL
Anesthesia: Spinal | Site: Knee | Laterality: Left | Wound class: Clean

## 2011-12-13 MED ORDER — CEFAZOLIN SODIUM 1-5 GM-% IV SOLN
1.0000 g | INTRAVENOUS | Status: AC
  Administered 2011-12-13: 1 g via INTRAVENOUS

## 2011-12-13 MED ORDER — PHENOL 1.4 % MT LIQD
1.0000 | OROMUCOSAL | Status: DC | PRN
  Filled 2011-12-13: qty 177

## 2011-12-13 MED ORDER — MORPHINE SULFATE 2 MG/ML IJ SOLN
1.0000 mg | INTRAMUSCULAR | Status: DC | PRN
  Administered 2011-12-13: 1 mg via INTRAVENOUS

## 2011-12-13 MED ORDER — CEFAZOLIN SODIUM 1-5 GM-% IV SOLN
1.0000 g | Freq: Four times a day (QID) | INTRAVENOUS | Status: AC
  Administered 2011-12-13 – 2011-12-14 (×3): 1 g via INTRAVENOUS
  Filled 2011-12-13 (×3): qty 50

## 2011-12-13 MED ORDER — MENTHOL 3 MG MT LOZG
1.0000 | LOZENGE | OROMUCOSAL | Status: DC | PRN
  Filled 2011-12-13: qty 9

## 2011-12-13 MED ORDER — MIDAZOLAM HCL 5 MG/5ML IJ SOLN
INTRAMUSCULAR | Status: DC | PRN
  Administered 2011-12-13: 2 mg via INTRAVENOUS

## 2011-12-13 MED ORDER — POLYETHYLENE GLYCOL 3350 17 G PO PACK
17.0000 g | PACK | Freq: Every day | ORAL | Status: DC | PRN
  Filled 2011-12-13: qty 1

## 2011-12-13 MED ORDER — RIVAROXABAN 10 MG PO TABS
10.0000 mg | ORAL_TABLET | Freq: Every day | ORAL | Status: DC
  Administered 2011-12-14 – 2011-12-17 (×4): 10 mg via ORAL
  Filled 2011-12-13 (×4): qty 1

## 2011-12-13 MED ORDER — MECLIZINE HCL 25 MG PO TABS
25.0000 mg | ORAL_TABLET | Freq: Three times a day (TID) | ORAL | Status: DC | PRN
  Filled 2011-12-13: qty 1

## 2011-12-13 MED ORDER — METOCLOPRAMIDE HCL 5 MG/ML IJ SOLN
5.0000 mg | Freq: Three times a day (TID) | INTRAMUSCULAR | Status: DC | PRN
  Administered 2011-12-14: 10 mg via INTRAVENOUS
  Filled 2011-12-13: qty 2

## 2011-12-13 MED ORDER — DIPHENHYDRAMINE HCL 12.5 MG/5ML PO ELIX: 12.5000 mg | ORAL_SOLUTION | ORAL | Status: DC | PRN

## 2011-12-13 MED ORDER — MORPHINE SULFATE 2 MG/ML IJ SOLN
INTRAMUSCULAR | Status: AC
  Filled 2011-12-13: qty 1

## 2011-12-13 MED ORDER — BISACODYL 5 MG PO TBEC: 5.0000 mg | DELAYED_RELEASE_TABLET | Freq: Every day | ORAL | Status: DC | PRN

## 2011-12-13 MED ORDER — DOCUSATE SODIUM 100 MG PO CAPS
100.0000 mg | ORAL_CAPSULE | Freq: Two times a day (BID) | ORAL | Status: DC
  Administered 2011-12-13 – 2011-12-17 (×9): 100 mg via ORAL
  Filled 2011-12-13 (×10): qty 1

## 2011-12-13 MED ORDER — FENTANYL CITRATE 0.05 MG/ML IJ SOLN
INTRAMUSCULAR | Status: DC | PRN
  Administered 2011-12-13: 50 ug via INTRAVENOUS

## 2011-12-13 MED ORDER — ACETAMINOPHEN 650 MG RE SUPP: 650.0000 mg | Freq: Four times a day (QID) | RECTAL | Status: DC | PRN

## 2011-12-13 MED ORDER — LORAZEPAM BOLUS VIA INFUSION: 0.5000 mg | Freq: Four times a day (QID) | INTRAVENOUS | Status: DC | PRN

## 2011-12-13 MED ORDER — HYDROMORPHONE HCL PF 1 MG/ML IJ SOLN
0.2500 mg | INTRAMUSCULAR | Status: DC | PRN
  Administered 2011-12-13: 0.5 mg via INTRAVENOUS

## 2011-12-13 MED ORDER — METHOCARBAMOL 100 MG/ML IJ SOLN
500.0000 mg | Freq: Four times a day (QID) | INTRAVENOUS | Status: DC | PRN
  Administered 2011-12-13: 500 mg via INTRAVENOUS
  Filled 2011-12-13 (×2): qty 5

## 2011-12-13 MED ORDER — BUPIVACAINE-EPINEPHRINE (PF) 0.5% -1:200000 IJ SOLN
INTRAMUSCULAR | Status: AC
  Filled 2011-12-13: qty 10

## 2011-12-13 MED ORDER — FERROUS GLUCONATE 324 (38 FE) MG PO TABS
325.0000 mg | ORAL_TABLET | Freq: Two times a day (BID) | ORAL | Status: DC
  Administered 2011-12-13 (×2): 324 mg via ORAL
  Administered 2011-12-14 – 2011-12-17 (×5): 325 mg via ORAL
  Filled 2011-12-13 (×10): qty 1

## 2011-12-13 MED ORDER — SODIUM CHLORIDE 0.9 % IR SOLN
Status: DC | PRN
  Administered 2011-12-13: 1

## 2011-12-13 MED ORDER — METHOCARBAMOL 500 MG PO TABS
500.0000 mg | ORAL_TABLET | Freq: Four times a day (QID) | ORAL | Status: DC | PRN
  Administered 2011-12-13 – 2011-12-16 (×2): 500 mg via ORAL
  Filled 2011-12-13 (×2): qty 1

## 2011-12-13 MED ORDER — PROPOFOL 10 MG/ML IV EMUL
INTRAVENOUS | Status: DC | PRN
  Administered 2011-12-13: 100 ug/kg/min via INTRAVENOUS

## 2011-12-13 MED ORDER — ACETAMINOPHEN 325 MG PO TABS: 650.0000 mg | ORAL_TABLET | Freq: Four times a day (QID) | ORAL | Status: DC | PRN

## 2011-12-13 MED ORDER — DEXTROSE-NACL 5-0.45 % IV SOLN
INTRAVENOUS | Status: DC
  Administered 2011-12-13: 1000 mL via INTRAVENOUS
  Administered 2011-12-14 – 2011-12-15 (×3): via INTRAVENOUS

## 2011-12-13 MED ORDER — HETASTARCH-ELECTROLYTES 6 % IV SOLN
INTRAVENOUS | Status: DC | PRN
  Administered 2011-12-13: 09:00:00 via INTRAVENOUS

## 2011-12-13 MED ORDER — LACTATED RINGERS IV SOLN
INTRAVENOUS | Status: DC
  Administered 2011-12-13 (×2): via INTRAVENOUS

## 2011-12-13 MED ORDER — SODIUM CHLORIDE 0.9 % IR SOLN
Status: DC | PRN
  Administered 2011-12-13: 09:00:00

## 2011-12-13 MED ORDER — BUPIVACAINE IN DEXTROSE 0.75-8.25 % IT SOLN
INTRATHECAL | Status: DC | PRN
  Administered 2011-12-13: 10 mg via INTRATHECAL

## 2011-12-13 MED ORDER — HYDROCODONE-ACETAMINOPHEN 7.5-325 MG PO TABS
1.0000 | ORAL_TABLET | ORAL | Status: DC
  Administered 2011-12-13 (×2): 2 via ORAL
  Administered 2011-12-14: 1 via ORAL
  Administered 2011-12-14 (×2): 2 via ORAL
  Administered 2011-12-15 (×3): 1 via ORAL
  Administered 2011-12-15: 2 via ORAL
  Administered 2011-12-15: 1 via ORAL
  Administered 2011-12-16: 2 via ORAL
  Administered 2011-12-16 (×3): 1 via ORAL
  Administered 2011-12-17 (×2): 2 via ORAL
  Administered 2011-12-17: 1 via ORAL
  Filled 2011-12-13: qty 1
  Filled 2011-12-13 (×5): qty 2
  Filled 2011-12-13: qty 1
  Filled 2011-12-13 (×3): qty 2
  Filled 2011-12-13: qty 1
  Filled 2011-12-13 (×3): qty 2
  Filled 2011-12-13 (×3): qty 1

## 2011-12-13 MED ORDER — HYDROMORPHONE HCL PF 1 MG/ML IJ SOLN
INTRAMUSCULAR | Status: AC
  Filled 2011-12-13: qty 1

## 2011-12-13 MED ORDER — EPHEDRINE SULFATE 50 MG/ML IJ SOLN
INTRAMUSCULAR | Status: DC | PRN
  Administered 2011-12-13: 5 mg via INTRAVENOUS

## 2011-12-13 MED ORDER — ACETAMINOPHEN 10 MG/ML IV SOLN
INTRAVENOUS | Status: DC | PRN
  Administered 2011-12-13: 1000 mg via INTRAVENOUS

## 2011-12-13 MED ORDER — ONDANSETRON HCL 4 MG/2ML IJ SOLN
4.0000 mg | Freq: Four times a day (QID) | INTRAMUSCULAR | Status: DC | PRN
  Administered 2011-12-14: 4 mg via INTRAVENOUS
  Filled 2011-12-13: qty 2

## 2011-12-13 MED ORDER — ONDANSETRON HCL 4 MG/2ML IJ SOLN
INTRAMUSCULAR | Status: DC | PRN
  Administered 2011-12-13: 4 mg via INTRAVENOUS

## 2011-12-13 MED ORDER — METOCLOPRAMIDE HCL 10 MG PO TABS: 5.0000 mg | ORAL_TABLET | Freq: Three times a day (TID) | ORAL | Status: DC | PRN

## 2011-12-13 MED ORDER — CEFAZOLIN SODIUM 1-5 GM-% IV SOLN
INTRAVENOUS | Status: AC
  Filled 2011-12-13: qty 50

## 2011-12-13 MED ORDER — LORAZEPAM 2 MG/ML IJ SOLN
0.5000 mg | INTRAMUSCULAR | Status: DC | PRN
  Administered 2011-12-13: 0.5 mg via INTRAVENOUS
  Filled 2011-12-13 (×2): qty 1

## 2011-12-13 MED ORDER — ONDANSETRON HCL 4 MG PO TABS: 4.0000 mg | ORAL_TABLET | Freq: Four times a day (QID) | ORAL | Status: DC | PRN

## 2011-12-13 MED ORDER — BUPIVACAINE-EPINEPHRINE 0.5% -1:200000 IJ SOLN
INTRAMUSCULAR | Status: DC | PRN
  Administered 2011-12-13: 20 mL

## 2011-12-13 MED ORDER — ACETAMINOPHEN 10 MG/ML IV SOLN
INTRAVENOUS | Status: AC
  Filled 2011-12-13: qty 100

## 2011-12-13 SURGICAL SUPPLY — 64 items
BAG SPEC THK2 15X12 ZIP CLS (MISCELLANEOUS) ×1
BAG ZIPLOCK 12X15 (MISCELLANEOUS) ×2 IMPLANT
BANDAGE ELASTIC 4 VELCRO ST LF (GAUZE/BANDAGES/DRESSINGS) ×2 IMPLANT
BANDAGE ELASTIC 6 VELCRO ST LF (GAUZE/BANDAGES/DRESSINGS) ×2 IMPLANT
BANDAGE ESMARK 6X9 LF (GAUZE/BANDAGES/DRESSINGS) ×1 IMPLANT
BLADE SAG 18X100X1.27 (BLADE) ×2 IMPLANT
BLADE SAW SGTL 13.0X1.19X90.0M (BLADE) ×2 IMPLANT
BNDG CMPR 9X6 STRL LF SNTH (GAUZE/BANDAGES/DRESSINGS) ×1
BNDG ESMARK 6X9 LF (GAUZE/BANDAGES/DRESSINGS) ×2
CEMENT HV SMART SET (Cement) ×4 IMPLANT
CHLORAPREP W/TINT 26ML (MISCELLANEOUS) IMPLANT
CLOTH BEACON ORANGE TIMEOUT ST (SAFETY) ×2 IMPLANT
CUFF TOURN SGL QUICK 34 (TOURNIQUET CUFF) ×2
CUFF TRNQT CYL 34X4X40X1 (TOURNIQUET CUFF) ×1 IMPLANT
DECANTER SPIKE VIAL GLASS SM (MISCELLANEOUS) ×2 IMPLANT
DRAPE LG THREE QUARTER DISP (DRAPES) ×2 IMPLANT
DRAPE ORTHO SPLIT 77X108 STRL (DRAPES) ×4
DRAPE POUCH INSTRU U-SHP 10X18 (DRAPES) ×2 IMPLANT
DRAPE SURG ORHT 6 SPLT 77X108 (DRAPES) ×2 IMPLANT
DRAPE U-SHAPE 47X51 STRL (DRAPES) ×2 IMPLANT
DRSG ADAPTIC 3X8 NADH LF (GAUZE/BANDAGES/DRESSINGS) ×2 IMPLANT
DRSG EMULSION OIL 3X16 NADH (GAUZE/BANDAGES/DRESSINGS) ×1 IMPLANT
DRSG PAD ABDOMINAL 8X10 ST (GAUZE/BANDAGES/DRESSINGS) ×2 IMPLANT
DURAPREP 26ML APPLICATOR (WOUND CARE) ×2 IMPLANT
ELECT REM PT RETURN 9FT ADLT (ELECTROSURGICAL) ×2
ELECTRODE REM PT RTRN 9FT ADLT (ELECTROSURGICAL) ×1 IMPLANT
EVACUATOR 1/8 PVC DRAIN (DRAIN) ×2 IMPLANT
FACESHIELD LNG OPTICON STERILE (SAFETY) ×10 IMPLANT
GLOVE BIO SURGEON STRL SZ 6.5 (GLOVE) ×2 IMPLANT
GLOVE BIOGEL PI IND STRL 8 (GLOVE) ×1 IMPLANT
GLOVE BIOGEL PI INDICATOR 8 (GLOVE) ×1
GLOVE ECLIPSE 6.5 STRL STRAW (GLOVE) ×2 IMPLANT
GLOVE INDICATOR 6.5 STRL GRN (GLOVE) ×2 IMPLANT
GLOVE SURG SS PI 8.0 STRL IVOR (GLOVE) ×4 IMPLANT
GOWN PREVENTION PLUS LG XLONG (DISPOSABLE) ×2 IMPLANT
GOWN PREVENTION PLUS XLARGE (GOWN DISPOSABLE) ×2 IMPLANT
GOWN STRL REIN XL XLG (GOWN DISPOSABLE) ×2 IMPLANT
HANDPIECE INTERPULSE COAX TIP (DISPOSABLE) ×2
IMMOBILIZER KNEE 20 (SOFTGOODS) ×2
IMMOBILIZER KNEE 20 THIGH 36 (SOFTGOODS) ×1 IMPLANT
KIT BASIN OR (CUSTOM PROCEDURE TRAY) ×2 IMPLANT
MANIFOLD NEPTUNE II (INSTRUMENTS) ×2 IMPLANT
NS IRRIG 1000ML POUR BTL (IV SOLUTION) ×2 IMPLANT
PACK TOTAL JOINT (CUSTOM PROCEDURE TRAY) ×2 IMPLANT
PADDING CAST ABS 6INX4YD NS (CAST SUPPLIES) ×1
PADDING CAST ABS COTTON 6X4 NS (CAST SUPPLIES) IMPLANT
PADDING CAST COTTON 6X4 STRL (CAST SUPPLIES) ×2 IMPLANT
POSITIONER SURGICAL ARM (MISCELLANEOUS) ×2 IMPLANT
SET HNDPC FAN SPRY TIP SCT (DISPOSABLE) ×1 IMPLANT
SPONGE GAUZE 4X4 12PLY (GAUZE/BANDAGES/DRESSINGS) ×1 IMPLANT
SPONGE SURGIFOAM ABS GEL 100 (HEMOSTASIS) ×2 IMPLANT
STAPLER VISISTAT (STAPLE) ×3 IMPLANT
SUCTION FRAZIER 12FR DISP (SUCTIONS) ×2 IMPLANT
SUT BONE WAX W31G (SUTURE) ×2 IMPLANT
SUT VIC AB 1 CT1 27 (SUTURE) ×8
SUT VIC AB 1 CT1 27XBRD ANTBC (SUTURE) ×4 IMPLANT
SUT VIC AB 2-0 CT1 27 (SUTURE) ×6
SUT VIC AB 2-0 CT1 TAPERPNT 27 (SUTURE) ×3 IMPLANT
SUT VLOC 180 0 24IN GS25 (SUTURE) ×2 IMPLANT
SYR 30ML LL (SYRINGE) ×2 IMPLANT
TOWER CARTRIDGE SMART MIX (DISPOSABLE) ×2 IMPLANT
TRAY FOLEY CATH 14FRSI W/METER (CATHETERS) ×2 IMPLANT
WATER STERILE IRR 1500ML POUR (IV SOLUTION) ×2 IMPLANT
WRAP KNEE MAXI GEL POST OP (GAUZE/BANDAGES/DRESSINGS) ×2 IMPLANT

## 2011-12-13 NOTE — H&P (Signed)
Janet Hernandez is an 78 y.o. female.   Chief Complaint: left knee pain HPI: OA Left knee refractory  Past Medical History  Diagnosis Date  . Arthritis   . Rash     inner legs- ? med allergy- to see PCP if doesnt improve by end of week  . Overactive bladder     Past Surgical History  Procedure Date  . Back surgery     lumbar-- ? type  Dr Shelle Iron  . Eye surgery     bilateral cataract extraction with IOL  . Abdominal hysterectomy   . Cleft lip repair     with repair palate  . Breast surgery     right lumpectomy    History reviewed. No pertinent family history. Social History:  reports that she has never smoked. She has never used smokeless tobacco. She reports that she drinks alcohol. She reports that she does not use illicit drugs.  Allergies: No Known Allergies  Medications Prior to Admission  Medication Dose Route Frequency Provider Last Rate Last Dose  . ceFAZolin (ANCEF) IVPB 1 g/50 mL premix  1 g Intravenous 60 min Pre-Op Liam Graham, PA      . lactated ringers infusion   Intravenous Continuous Liam Graham, PA       No current outpatient prescriptions on file as of 12/13/2011.    No results found for this or any previous visit (from the past 48 hour(s)). No results found.  Review of Systems  Constitutional: Negative.   HENT: Negative.   Eyes: Negative.   Respiratory: Negative.   Cardiovascular: Negative.   Gastrointestinal: Negative.   Musculoskeletal: Positive for joint pain.  Skin: Negative.   Neurological: Negative.   Endo/Heme/Allergies: Negative.   Psychiatric/Behavioral: Negative.   All other systems reviewed and are negative.    Blood pressure 122/69, pulse 93, temperature 97.8 F (36.6 C), resp. rate 20, SpO2 95.00%. Physical Exam  Vitals reviewed. Constitutional: She is oriented to person, place, and time. She appears well-developed.  HENT:  Head: Normocephalic.  Eyes: Pupils are equal, round, and reactive to light.  Neck:  Normal range of motion.  Cardiovascular: Normal rate.   Respiratory: Effort normal.  GI: Soft.  Musculoskeletal: She exhibits edema and tenderness.  Neurological: She is alert and oriented to person, place, and time.  Skin: Skin is warm and dry.  Psychiatric: She has a normal mood and affect.     Assessment/Plan OA End stage left knee. Plan TKR. Risks discussed.  Janet Hernandez C 12/13/2011, 7:27 AM

## 2011-12-13 NOTE — Anesthesia Procedure Notes (Signed)
Spinal  Patient location during procedure: OR Start time: 12/13/2011 7:25 AM End time: 12/13/2011 7:34 AM Staffing Anesthesiologist: Lestine Box B Performed by: anesthesiologist  Preanesthetic Checklist Completed: patient identified, site marked, surgical consent, pre-op evaluation, timeout performed, IV checked, risks and benefits discussed and monitors and equipment checked Spinal Block Patient position: sitting Prep: Betadine and site prepped and draped Patient monitoring: heart rate, cardiac monitor, continuous pulse ox and blood pressure Approach: right paramedian Location: L3-4 Injection technique: single-shot Needle Needle type: Quincke  Needle gauge: 25 G Needle insertion depth: 3 cm Assessment Sensory level: T6 Additional Notes [id number], 2014-09

## 2011-12-13 NOTE — Preoperative (Signed)
Beta Blockers   Reason not to administer Beta Blockers:Not Applicable 

## 2011-12-13 NOTE — Transfer of Care (Signed)
Immediate Anesthesia Transfer of Care Note  Patient: Janet Hernandez  Procedure(s) Performed: Procedure(s) (LRB): TOTAL KNEE ARTHROPLASTY (Left)  Patient Location: PACU  Anesthesia Type: Spinal  Level of Consciousness: awake, alert  and patient cooperative  Airway & Oxygen Therapy: Patient Spontanous Breathing and Patient connected to face mask oxygen  Post-op Assessment: Report given to PACU RN and Post -op Vital signs reviewed and stable  Post vital signs: Reviewed and stable  Complications: No apparent anesthesia complications

## 2011-12-13 NOTE — Progress Notes (Signed)
X-RAY RESULTS NOTED. 

## 2011-12-13 NOTE — Anesthesia Preprocedure Evaluation (Signed)
Anesthesia Evaluation  Patient identified by MRN, date of birth, ID band Patient awake  General Assessment Comment:Advanced yrs  Reviewed: Allergy & Precautions, H&P , NPO status , Patient's Chart, lab work & pertinent test results, reviewed documented beta blocker date and time   Airway Mallampati: II TM Distance: >3 FB Neck ROM: Full    Dental  (+) Partial Upper, Teeth Intact and Dental Advisory Given   Pulmonary neg pulmonary ROS,  breath sounds clear to auscultation        Cardiovascular negative cardio ROS  Rhythm:Regular Rate:Normal  Denies cardiac symptoms   Neuro/Psych negative neurological ROS  negative psych ROS   GI/Hepatic negative GI ROS, Neg liver ROS,   Endo/Other  negative endocrine ROS  Renal/GU negative Renal ROS  negative genitourinary   Musculoskeletal   Abdominal   Peds negative pediatric ROS (+)  Hematology negative hematology ROS (+)   Anesthesia Other Findings   Reproductive/Obstetrics negative OB ROS                           Anesthesia Physical Anesthesia Plan  ASA: II  Anesthesia Plan: Spinal   Post-op Pain Management:    Induction: Intravenous  Airway Management Planned: Mask  Additional Equipment:   Intra-op Plan:   Post-operative Plan:   Informed Consent: I have reviewed the patients History and Physical, chart, labs and discussed the procedure including the risks, benefits and alternatives for the proposed anesthesia with the patient or authorized representative who has indicated his/her understanding and acceptance.     Plan Discussed with: CRNA and Surgeon  Anesthesia Plan Comments:         Anesthesia Quick Evaluation

## 2011-12-13 NOTE — Brief Op Note (Signed)
12/13/2011  9:30 AM  PATIENT:  Janet Hernandez  78 y.o. female  PRE-OPERATIVE DIAGNOSIS:  osteoarthritis left knee  POST-OPERATIVE DIAGNOSIS:  osteoarthritis left knee  PROCEDURE:  Procedure(s) (LRB): TOTAL KNEE ARTHROPLASTY (Left)  SURGEON:  Surgeon(s) and Role:    * Javier Docker, MD - Primary  PHYSICIAN ASSISTANT:   ASSISTANTS: strader   ANESTHESIA:   general  EBL:  Total I/O In: 2200 [I.V.:1700; IV Piggyback:500] Out: 200 [Urine:200]  BLOOD ADMINISTERED:none  DRAINS: (1) Hemovact drain(s) in the 1 with  Suction Open   LOCAL MEDICATIONS USED:  MARCAINE     SPECIMEN:  No Specimen  DISPOSITION OF SPECIMEN:  N/A  COUNTS:  YES  TOURNIQUET:   Total Tourniquet Time Documented: Thigh (Left) - 81 minutes  DICTATION: .Other Dictation: Dictation Number [id number]  PLAN OF CARE: Admit to inpatient   PATIENT DISPOSITION:  PACU - hemodynamically stable.   Delay start of Pharmacological VTE agent (>24hrs) due to surgical blood loss or risk of bleeding: no

## 2011-12-13 NOTE — Progress Notes (Signed)
Portable AP and LATERAL LEFT KNEE X-RAYS DONE.

## 2011-12-13 NOTE — Anesthesia Postprocedure Evaluation (Signed)
  Anesthesia Post-op Note  Patient: Janet Hernandez  Procedure(s) Performed: Procedure(s) (LRB): TOTAL KNEE ARTHROPLASTY (Left)  Patient Location: PACU  Anesthesia Type: Spinal  Level of Consciousness: oriented and sedated  Airway and Oxygen Therapy: Patient Spontanous Breathing and Patient connected to nasal cannula oxygen  Post-op Pain: mild  Post-op Assessment: Post-op Vital signs reviewed, Patient's Cardiovascular Status Stable, Respiratory Function Stable and Patent Airway  Post-op Vital Signs: stable  Complications: No apparent anesthesia complications

## 2011-12-14 LAB — CBC
HCT: 37 % (ref 36.0–46.0)
Hemoglobin: 11.6 g/dL — ABNORMAL LOW (ref 12.0–15.0)
MCH: 25.7 pg — ABNORMAL LOW (ref 26.0–34.0)
MCHC: 31.4 g/dL (ref 30.0–36.0)
MCV: 81.9 fL (ref 78.0–100.0)
Platelets: 221 10*3/uL (ref 150–400)
RBC: 4.52 MIL/uL (ref 3.87–5.11)
RDW: 21.5 % — ABNORMAL HIGH (ref 11.5–15.5)
WBC: 9.7 10*3/uL (ref 4.0–10.5)

## 2011-12-14 LAB — BASIC METABOLIC PANEL
CO2: 25 mEq/L (ref 19–32)
Glucose, Bld: 143 mg/dL — ABNORMAL HIGH (ref 70–99)
Potassium: 3.6 mEq/L (ref 3.5–5.1)
Sodium: 135 mEq/L (ref 135–145)

## 2011-12-14 MED ORDER — BISACODYL 5 MG PO TBEC: 5.0000 mg | DELAYED_RELEASE_TABLET | Freq: Every day | ORAL | Status: AC | PRN

## 2011-12-14 MED ORDER — RIVAROXABAN 10 MG PO TABS: 10.0000 mg | ORAL_TABLET | Freq: Every day | ORAL | Status: DC

## 2011-12-14 MED ORDER — HYDROCODONE-ACETAMINOPHEN 7.5-325 MG PO TABS: 1.0000 | ORAL_TABLET | ORAL | Status: AC

## 2011-12-14 NOTE — Op Note (Signed)
NAME:  CHRYS, LANDGREBE NO.:  [id number]  MEDICAL RECORD NO.:  [id number]  LOCATION:  1609                         FACILITY:  [facility]  PHYSICIAN:  Jene Every, M.D.    DATE OF BIRTH:  04/08/1930  DATE OF PROCEDURE: DATE OF DISCHARGE:                              OPERATIVE REPORT   PREOPERATIVE DIAGNOSIS:  Osteoarthritis of the left knee.  POSTOPERATIVE DIAGNOSIS:  Osteoarthritis of the left knee.  PROCEDURE PERFORMED:  Left total knee arthroplasty.  COMPONENTS:  DePuy rotating platform, 3 femur, 3 tibia, 10 mm insert, 35 patella.  ASSISTANT:  Roma Schanz, P.A.  HISTORY:  81, osteoarthrosis of the knee, grade 4 changes, previous arthroscopy, refractory to conservative treatment, indicated for replacement of the degenerated joint.  Risks and benefits were discussed including bleeding, infection, damage to neurovascular structures, suboptimal range of motion, arthrofibrosis, component failure, need for revision etc.  TECHNIQUE:  With the patient in supine position, after induction of adequate general anesthesia, 2 g Kefzol, the left lower extremity was prepped, draped and exsanguinated in the usual sterile fashion.  Thigh tourniquet was inflated to 300 mmHg.  The knee was flexed in midline incision.  Full-thickness flaps and median and parapatellar arthrotomy was performed, soft tissue medially released, the knee flexed, patella everted.  Significant defect noted on the tibial plateau anterior laterally of the patellofemoral joint, grade 4 changes as well as of the weightbearing surface of the femoral condyle.  The ACL was essentially absent.  We removed the remnants of the medial lateral menisci. Osteophytes were removed with a rongeur.  Step drill was utilized to enter the femoral canal, it was irrigated, 5 degree left was placed, 10 mm off the distal femur, pinned.  An oscillating saw performed a distal femoral cut, it was then sized off the anterior  cortex to a 3, this was pinned.  Anterior, posterior, and chamfer cuts were then performed. Soft tissue was protected.  We then turned our attention towards the tibia, it was subluxed, external alignment guide utilized parallel to the shaft bisecting the ankle, 10 off the high side, which was lateral and oscillating saw performed the proximal tibial cut.  We then checked our flexion-extension gaps, which were equivalent with a 10 mm insert. We then checked posteriorly, cauterized the geniculates, removed remnants of the menisci.  We then turned our attention towards completing the tibia.  Subluxed KO was utilized, it was sized to a 3 maximal coverage, just off the medial aspect of the tibial tubercle. This was then pinned and a punch guide utilized, centrally drilled.  We then used a trial 3 femur, 10 mm insert, reduced, full extension, full flexion.  Negative anterior drawer.  Good stability, varus valgus stressing 0-30 degrees.  Patella everted, measured to 35, planed to a 15 utilizing the patellar planer.  PEG hole was drilled medializing the patella, patellar button placed with trial of the patella, there was satisfactory patellofemoral tracking.  Next, all trials were removed and checked posteriorly, no residual debris.  Pulsatile lavage was utilized to clean the bony surfaces.  Cement was mixed in the appropriate fashion.  The knee was flexed, all surfaces dried.  We injected cement into the  proximal tibia, digitally pressurized it and then impacted the 3 permanent tray, cemented the distal femur with impaction, placed a 10 mm insert, reduced the knee, axial load applied through the curing of the cement, cemented the patellar button as well.  Following full curing of the cement, we trialed the flexion extension, it was satisfactory with a 10 mm insert.  Removed the trial, removed redundant cement.  We used pulsatile lavage.  I placed a 10 mm permanent insert, reduced,  full extension, full flexion, good stability, varus valgus stressing 0-30 degrees.  Good patellofemoral tracking.  We then placed a Hemovac and brought it out through a lateral stab wound in the skin.  Slight flexion.  Patellar arthrotomy was repaired with V-Loc running suture with excellent closure.  Subcu with 0 and 2-0 Vicryl simple sutures. The skin was reapproximated with a stapler.  The patient had flexion to 90, good flexion-extension, negative anterior drawer, good stability.  A sterile dressing was applied.  The tourniquet was deflated and there was adequate revascularization of lower extremity appreciated.  The patient tolerated the procedure well.  No complications.  Assistant was [employer].  Tourniquet time 70 minutes.     Jene Every, M.D.     Cordelia Pen  D:  12/13/2011  T:  12/14/2011  Job:  [id number]

## 2011-12-14 NOTE — Progress Notes (Signed)
Physical Therapy Treatment Patient Details Name: Janet Hernandez MRN: [id number] DOB: February 08, 1930 Today's Date: 12/14/2011 Time: 4098-1191 PT Time Calculation (min): 14 min  PT Assessment / Plan / Recommendation Comments on Treatment Session  Pt reluctant 2* pain but with encouragement perfoms well    Follow Up Recommendations  Skilled nursing facility    Equipment Recommendations  Defer to next venue    Frequency 7X/week    Precautions / Restrictions Precautions Precautions: Knee Required Braces or Orthoses: Knee Immobilizer - Left Knee Immobilizer - Left: Discontinue once straight leg raise with < 10 degree lag;On when out of bed or walking Restrictions Weight Bearing Restrictions: No Other Position/Activity Restrictions: WBAT       Mobility  Bed Mobility Bed Mobility: Supine to Sit Supine to Sit: 1: +2 Total assist (pt 60%) Supine to Sit: Patient Percentage: 60% Sit to Supine: 1: +2 Total assist Sit to Supine: Patient Percentage: 60% Details for Bed Mobility Assistance: cues for use of UEs to self assist and for sequence Transfers Transfers: Sit to Stand;Stand to Sit Sit to Stand: 1: +2 Total assist Sit to Stand: Patient Percentage: 60% Stand to Sit: 1: +2 Total assist Stand to Sit: Patient Percentage: 60% Details for Transfer Assistance: cues for LE management and use of UEs Ambulation/Gait Ambulation/Gait Assistance: 1: +2 Total assist Ambulation/Gait: Patient Percentage: 70 Ambulation Distance (Feet): 6 Feet Assistive device: Rolling walker Ambulation/Gait Assistance Details: cues for sequence, posture, position from RW and increased UE WB Gait Pattern: Step-to pattern    PT Goals Acute Rehab PT Goals PT Goal Formulation: With patient Time For Goal Achievement: 12/21/11 Potential to Achieve Goals: Fair Pt will go Supine/Side to Sit: with supervision PT Goal: Supine/Side to Sit - Progress: Goal set today Pt will go Sit to Supine/Side: with  supervision PT Goal: Sit to Supine/Side - Progress: Goal set today Pt will go Sit to Stand: with supervision PT Goal: Sit to Stand - Progress: Goal set today Pt will go Stand to Sit: with supervision PT Goal: Stand to Sit - Progress: Goal set today Pt will Ambulate: 51 - 150 feet;with supervision;with rolling walker PT Goal: Ambulate - Progress: Goal set today  Visit Information  Last PT Received On: 12/14/11 Assistance Needed: +2    Subjective Data  Subjective: It hurts, I can't do this Patient Stated Goal: Resume previous lifestyle with decreased pain   Cognition  Overall Cognitive Status: Appears within functional limits for tasks assessed/performed Arousal/Alertness: Awake/alert Orientation Level: Appears intact for tasks assessed Behavior During Session: [facility] for tasks performed Cognition - Other Comments: Pt with apparent memory deficits with multiple repetitions of information required but pt very cooperative    Balance     End of Session @[id number])@    Jillienne Egner 12/14/2011, 12:18 PM

## 2011-12-14 NOTE — Progress Notes (Signed)
Subjective: 1 Day Post-Op Procedure(s) (LRB): TOTAL KNEE ARTHROPLASTY (Left) Patient reports pain as moderate.  Denies CP or SOB  Patient has complaints of knee pain as expected  We will start therapy today. Plan is to go [facility]  after hospital stay.   Objective: Vital signs in last 24 hours: Temp:  [96.7 F (35.9 C)-99.2 F (37.3 C)] 99.2 F (37.3 C) (04/19 0529) Pulse Rate:  [57-90] 90  (04/19 0529) Resp:  [10-20] 20  (04/19 0529) BP: (92-162)/(31-85) 123/64 mmHg (04/19 0529) SpO2:  [90 %-100 %] 91 % (04/19 0529) Weight:  [74.844 kg (165 lb)] 74.844 kg (165 lb) (04/18 1203)  Intake/Output from previous day:  Intake/Output Summary (Last 24 hours) at 12/14/11 0755 Last data filed at 12/14/11 0600  Gross per 24 hour  Intake   4120 ml  Output   2930 ml  Net   1190 ml    Intake/Output this shift:    Labs: Results for orders placed during the hospital encounter of 12/13/11  TYPE AND SCREEN      Component Value Range   ABO/RH(D) O POS     Antibody Screen NEG     Sample Expiration 12/16/2011    CBC      Component Value Range   WBC 9.7  4.0 - 10.5 (K/uL)   RBC 4.52  3.87 - 5.11 (MIL/uL)   Hemoglobin 11.6 (*) 12.0 - 15.0 (g/dL)   HCT 57.8  46.9 - 62.9 (%)   MCV 81.9  78.0 - 100.0 (fL)   MCH 25.7 (*) 26.0 - 34.0 (pg)   MCHC 31.4  30.0 - 36.0 (g/dL)   RDW 52.8 (*) 41.3 - 15.5 (%)   Platelets 221  150 - 400 (K/uL)  BASIC METABOLIC PANEL      Component Value Range   Sodium 135  135 - 145 (mEq/L)   Potassium 3.6  3.5 - 5.1 (mEq/L)   Chloride 102  96 - 112 (mEq/L)   CO2 25  19 - 32 (mEq/L)   Glucose, Bld 143 (*) 70 - 99 (mg/dL)   BUN 14  6 - 23 (mg/dL)   Creatinine, Ser 2.44  0.50 - 1.10 (mg/dL)   Calcium 8.8  8.4 - 01.0 (mg/dL)   GFR calc non Af Amer 80 (*) >90 (mL/min)   GFR calc Af Amer >90  >90 (mL/min)   Patient is a little confused today but not unusual from her baseline  Exam - Neurologically intact Neurovascular intact Sensation intact  distally Dorsiflexion/Plantar flexion intact Incision: dressing C/D/I Compartment soft Dressing - clean, no drainage Motor function intact - moving foot and toes well on exam.    Assessment/Plan: 1 Day Post-Op Procedure(s) (LRB): TOTAL KNEE ARTHROPLASTY (Left)  Advance diet Up with therapy Past Medical History  Diagnosis Date  . Arthritis   . Rash     inner legs- ? med allergy- to see PCP if doesnt improve by end of week  . Overactive bladder     DVT Prophylaxis - Xarelto Protocol Weight-Bearing as tolerated to left leg KVO IV D/c foley once out of bed with PT Will d/c hemovac later today and plan for dressing change tomorrow D/c to SNF planned for Monday No vaccines.  Addysen Louth R. 12/14/2011, 7:55 AM

## 2011-12-14 NOTE — Progress Notes (Signed)
CARE MANAGEMENT NOTE 12/14/2011  Patient:  Janet Hernandez, Janet Hernandez   Account Number:  [id number]  Date Initiated:  12/14/2011  Documentation initiated by:  Colleen Can  Subjective/Objective Assessment:   dx total knee replacemnt     Action/Plan:   SNF [facility]   Anticipated DC Date:  12/16/2011   Anticipated DC Plan:  SKILLED NURSING FACILITY  In-house referral  Clinical Social Worker      DC Planning Services  NA      [facility] Choice  NA   Choice offered to / List presented to:  NA   DME arranged  NA      DME agency  NA     HH arranged  NA      Status of service:  Completed, signed off

## 2011-12-14 NOTE — Progress Notes (Signed)
CSW met with patient. Patient plans to [facility] upon discharge. [facility] aware [facility] holding [facility] bed for her. FL2 completed and [facility] to [facility]. FL2 placed on chart for physician signature. Placement note and psychosocial placed on chart. Will follow for discharge.  Sylvana Bonk C. Merrin Mcvicker MSW, LCSW [phone]

## 2011-12-14 NOTE — Progress Notes (Signed)
Physical Therapy Treatment Patient Details Name: BRUCHY MIKEL MRN: [id number] DOB: 04/14/1930 Today's Date: 12/14/2011 Time: 4696-2952 PT Time Calculation (min): 26 min  PT Assessment / Plan / Recommendation Comments on Treatment Session       Follow Up Recommendations  Skilled nursing facility    Equipment Recommendations  Defer to next venue    Frequency 7X/week   Plan Discharge plan remains appropriate    Precautions / Restrictions Precautions Precautions: Knee Required Braces or Orthoses: Knee Immobilizer - Left Knee Immobilizer - Left: Discontinue once straight leg raise with < 10 degree lag;On when out of bed or walking Restrictions Other Position/Activity Restrictions: WBAT   Pertinent Vitals/Pain     Mobility  Bed Mobility Bed Mobility: Sit to Supine Supine to Sit: 1: +2 Total assist Supine to Sit: Patient Percentage: 70% Sit to Supine: 1: +2 Total assist Sit to Supine: Patient Percentage: 70% Details for Bed Mobility Assistance: cues for use of UEs to self assist and for sequence Transfers Transfers: Sit to Stand;Stand to Sit Sit to Stand: 3: Mod assist Sit to Stand: Patient Percentage: 70% Stand to Sit: 3: Mod assist Stand to Sit: Patient Percentage: 70% Details for Transfer Assistance: cues for LE management and use of UEs Ambulation/Gait Ambulation/Gait Assistance: 1: +2 Total assist Ambulation/Gait: Patient Percentage: 70 Ambulation Distance (Feet): 38 Feet Assistive device: Rolling walker Ambulation/Gait Assistance Details: cues for sequence, posture, position from RW and increased UE WB Gait Pattern: Step-to pattern    Exercises     PT Goals Acute Rehab PT Goals PT Goal Formulation: With patient Time For Goal Achievement: 12/21/11 Potential to Achieve Goals: Fair Pt will go Supine/Side to Sit: with supervision PT Goal: Supine/Side to Sit - Progress: Progressing toward goal Pt will go Sit to Supine/Side: with supervision PT Goal: Sit to  Supine/Side - Progress: Progressing toward goal Pt will go Sit to Stand: with supervision PT Goal: Sit to Stand - Progress: Progressing toward goal Pt will go Stand to Sit: with supervision PT Goal: Stand to Sit - Progress: Progressing toward goal Pt will Ambulate: 51 - 150 feet;with supervision;with rolling walker PT Goal: Ambulate - Progress: Progressing toward goal  Visit Information  Last PT Received On: 12/14/11 Assistance Needed: +2    Subjective Data  Subjective: I can do this Patient Stated Goal: Resume previous lifestyle with decreased pain   Cognition  Overall Cognitive Status: Appears within functional limits for tasks assessed/performed Arousal/Alertness: Awake/alert Orientation Level: Appears intact for tasks assessed Behavior During Session: [facility] for tasks performed Cognition - Other Comments: Pt with apparent memory deficits with multiple repetitions of information required but pt very cooperative    Balance     End of Session PT - End of Session Equipment Utilized During Treatment: Left knee immobilizer Activity Tolerance: Patient tolerated treatment well Patient left: in bed;with call bell/phone within reach;with family/visitor present Nurse Communication: Mobility status    Eldean Klatt 12/14/2011, 3:48 PM

## 2011-12-14 NOTE — Discharge Summary (Signed)
Patient ID: Janet Hernandez MRN: [id number] DOB/AGE: 1930/04/30 78 y.o.  Admit date: 12/13/2011 Discharge date: 12/14/2011  Admission Diagnoses:  Osteoarthritis of left knee Past Medical History  Diagnosis Date  . Arthritis   . Rash     inner legs- ? med allergy- to see PCP if doesnt improve by end of week  . Overactive bladder    Discharge Diagnoses:  Same s/p left TKA   Surgeries: Procedure(s): TOTAL KNEE ARTHROPLASTY on 12/13/2011   Consultants:  PT/OT, care management  Discharged Condition: Improved  Hospital Course: QIARA MINETTI is an 78 y.o. female who was admitted 12/13/2011 for operative treatment of osteoarthritis of the left knee. Patient has severe unremitting pain that affects sleep, daily activities, and work/hobbies. After pre-op clearance the patient was taken to the operating room on 12/13/2011 and underwent  Procedure(s): TOTAL KNEE ARTHROPLASTY.    Patient was given perioperative antibiotics: Anti-infectives     Start     Dose/Rate Route Frequency Ordered Stop   12/13/11 1400   ceFAZolin (ANCEF) IVPB 1 g/50 mL premix        1 g 100 mL/hr over 30 Minutes Intravenous Every 6 hours 12/13/11 1213 12/14/11 0200   12/13/11 0832   polymyxin B 500,000 Units, bacitracin 50,000 Units in sodium chloride irrigation 0.9 % 500 mL irrigation  Status:  Discontinued          As needed 12/13/11 0832 12/13/11 0931   12/13/11 0555   ceFAZolin (ANCEF) IVPB 1 g/50 mL premix        1 g 100 mL/hr over 30 Minutes Intravenous 60 min pre-op 12/13/11 0555 12/13/11 0750           Patient was given sequential compression devices, early ambulation, and chemoprophylaxis to prevent DVT.  Patient benefited maximally from hospital stay and there were no complications. Patient did have some memory issues during hospital course but this has been noted at past appointments in the office.   Recent vital signs: Patient Vitals for the past 24 hrs:  BP Temp Temp src Pulse Resp SpO2  Height Weight  12/14/11 1021 109/74 mmHg 97.4 F (36.3 C) Oral 75  18  92 % - -  12/14/11 0529 123/64 mmHg 99.2 F (37.3 C) - 90  20  91 % - -  12/14/11 0138 118/71 mmHg 99.2 F (37.3 C) - 83  18  95 % - -  12/13/11 2150 102/70 mmHg 98.7 F (37.1 C) - 85  20  90 % - -  12/13/11 1500 134/74 mmHg 98.3 F (36.8 C) Oral 81  20  98 % - -  12/13/11 1400 149/81 mmHg 97.7 F (36.5 C) Oral 65  20  94 % - -  12/13/11 1300 162/85 mmHg 97.5 F (36.4 C) Oral 61  20  100 % - -  12/13/11 1203 129/76 mmHg 97.5 F (36.4 C) Oral 58  20  98 % 5\' 6"  (1.676 m) 74.844 kg (165 lb)  12/13/11 1130 101/71 mmHg 97.8 F (36.6 C) - 57  12  95 % - -  12/13/11 1117 97/48 mmHg - - 58  13  96 % - -  12/13/11 1115 97/48 mmHg 97.6 F (36.4 C) - 58  13  95 % - -  12/13/11 1100 106/58 mmHg 98 F (36.7 C) - 64  12  94 % - -  12/13/11 1045 94/61 mmHg 97 F (36.1 C) - 69  15  95 % - -  12/13/11 1030  100/70 mmHg 96.7 F (35.9 C) - 64  11  96 % - -     Recent laboratory studies:  [facility] 12/14/11 0355  WBC 9.7  HGB 11.6*  HCT 37.0  PLT 221  NA 135  K 3.6  CL 102  CO2 25  BUN 14  CREATININE 0.71  GLUCOSE 143*  INR --  CALCIUM 8.8     Discharge Medications:   Medication List  As of 12/14/2011 10:25 AM   STOP taking these medications         HYDROcodone-acetaminophen 5-325 MG per tablet      meloxicam 7.5 MG tablet         TAKE these medications         bisacodyl 5 MG EC tablet   Commonly known as: DULCOLAX   Take 1 tablet (5 mg total) by mouth daily as needed.      ferrous gluconate 325 MG tablet   Commonly known as: FERGON   Take 325 mg by mouth 2 (two) times daily.      Glucosamine HCl 1500 MG Tabs   Take 1,500 mg by mouth daily.      HYDROcodone-acetaminophen 7.5-325 MG per tablet   Commonly known as: NORCO   Take 1-2 tablets by mouth every 4 (four) hours.      meclizine 25 MG tablet   Commonly known as: ANTIVERT   Take 25 mg by mouth 3 (three) times daily as needed. Vertigo          metaxalone 800 MG tablet   Commonly known as: SKELAXIN   Take 800 mg by mouth daily.      rivaroxaban 10 MG Tabs tablet   Commonly known as: XARELTO   Take 1 tablet (10 mg total) by mouth daily with breakfast.      TURMERIC CURCUMIN PO   Take 400 mg by mouth daily.            Diagnostic Studies: Dg Knee 1-2 Views Left  12-26-2011  *RADIOLOGY REPORT*  Clinical Data: Preoperative evaluation.  Flow or total knee arthroplasty procedure.  LEFT KNEE - 1-2 VIEW  Comparison: None.  Findings: No definite joint effusion is seen.  There is slight narrowing of medial joint space.  There is narrowing of patellofemoral joint space.  There is patellar spurring. No fracture or bony destruction is seen.  There appears to be minimal calcification involving the lateral meniscus indicating chondrocalcinosis.  On the lateral image there are several tiny calcific densities in the anterior joint space.  These could be related to meniscus or reflect tiny loose bodies.  IMPRESSION: Narrowing of medial joint space and patellofemoral joint space. Patellar spurring. Minimal calcification of lateral meniscus indicating chondrocalcinosis.  This may be associated with gout as well as the various causes of pseudogout syndrome including calcium pyrophosphate deposition disease. Several tiny calcifications projecting anterior joint space on lateral image.  These could be meniscal or represent tiny loose bodies.  Original Report Authenticated By: Crawford Givens, M.D.   X-ray Knee Left Port  12/13/2011  *RADIOLOGY REPORT*  Clinical Data: Postop left knee arthroplasty.  PORTABLE LEFT KNEE - 1-2 VIEW  Comparison: Dec 26, 2011.  Findings: Patient is post left total knee arthroplasty.  Surgical skin staples are in place.  Subcutaneous and joint air and fluid are noted.  Components appear well seated.  IMPRESSION: Left total knee arthroplasty with expected postoperative findings.  Original Report Authenticated By: Reyes Ivan,  M.D.    Disposition: D/C to  SNF  Discharge Orders    Future Orders Please Complete By Expires   Diet - low sodium heart healthy      Call MD / Call 911      Comments:   If you experience chest pain or shortness of breath, CALL 911 and be transported to the hospital emergency room.  If you develope a fever above 101 F, pus (white drainage) or increased drainage or redness at the wound, or calf pain, call your surgeon's office.   Constipation Prevention      Comments:   Drink plenty of fluids.  Prune juice may be helpful.  You may use a stool softener, such as Colace (over the counter) 100 mg twice a day.  Use MiraLax (over the counter) for constipation as needed.   Increase activity slowly as tolerated      Weight Bearing as taught in Physical Therapy      Comments:   Use a walker or crutches as instructed.   Discharge instructions      Comments:   Use knee immobilizer when walking until can SLR x 10 Elevate above heart level at least 6 x a day for 20 minutes Daily dressing change  Keep wound clean and dry OK to shower     TED hose      Comments:   Use stockings (TED hose) for 3 weeks on both leg(s).  You may remove them at night for sleeping.   Change dressing      Comments:   change the dressing daily with sterile 4 x 4 inch gauze dressing and apply TED hose.      Follow-up Information    Follow up with BEANE,JEFFREY C, MD in 14 days. (from surgery or as scheduled)    Contact information:   [facility] [address] [place] [address] [phone]           Signed: Liam Graham. 12/14/2011, 10:25 AM

## 2011-12-14 NOTE — Evaluation (Signed)
Physical Therapy Evaluation Patient Details Name: Janet Hernandez MRN: [id number] DOB: 08/26/1930 Today's Date: 12/14/2011 Time: 4098-1191 PT Time Calculation (min): 39 min  PT Assessment / Plan / Recommendation Clinical Impression  Pt wtih L TKR presents with decreased L LE strength/ROM and limited functional mobility    PT Assessment  Patient needs continued PT services    Follow Up Recommendations  Skilled nursing facility    Equipment Recommendations  Defer to next venue    Frequency 7X/week    Precautions / Restrictions Precautions Precautions: Knee Required Braces or Orthoses: Knee Immobilizer - Left Knee Immobilizer - Left: Discontinue once straight leg raise with < 10 degree lag;On when out of bed or walking Restrictions Weight Bearing Restrictions: No Other Position/Activity Restrictions: WBAT         Mobility  Bed Mobility Bed Mobility: Supine to Sit Supine to Sit: 1: +2 Total assist (pt 60%) Supine to Sit: Patient Percentage: 60% Details for Bed Mobility Assistance: cues for use of UEs to self assist and for sequence Transfers Transfers: Sit to Stand;Stand to Sit Sit to Stand: 1: +2 Total assist Sit to Stand: Patient Percentage: 60% Stand to Sit: 1: +2 Total assist Stand to Sit: Patient Percentage: 60% Details for Transfer Assistance: cues for LE management and use of UEs Ambulation/Gait Ambulation/Gait Assistance: 1: +2 Total assist Ambulation/Gait: Patient Percentage: 70 Ambulation Distance (Feet): 17 Feet Assistive device: Rolling walker Ambulation/Gait Assistance Details: cues for sequence, posture, position from RW and increased UE WB Gait Pattern: Step-to pattern    Exercises Total Joint Exercises Ankle Circles/Pumps: AROM;10 reps;Supine;Both Quad Sets: AROM;10 reps;Supine;Both Heel Slides: AAROM;10 reps;Supine;Left Straight Leg Raises: AAROM;10 reps;Left;Supine   PT Goals Acute Rehab PT Goals PT Goal Formulation: With patient Time For  Goal Achievement: 12/21/11 Potential to Achieve Goals: Fair Pt will go Supine/Side to Sit: with supervision PT Goal: Supine/Side to Sit - Progress: Goal set today Pt will go Sit to Supine/Side: with supervision PT Goal: Sit to Supine/Side - Progress: Goal set today Pt will go Sit to Stand: with supervision PT Goal: Sit to Stand - Progress: Goal set today Pt will go Stand to Sit: with supervision PT Goal: Stand to Sit - Progress: Goal set today Pt will Ambulate: 51 - 150 feet;with supervision;with rolling walker PT Goal: Ambulate - Progress: Goal set today  Visit Information  Last PT Received On: 12/14/11 Assistance Needed: +2    Subjective Data  Subjective: I don't think I can do this. Patient Stated Goal: Resume previous lifestyle with decreased pain   Prior Functioning  Home Living Lives With: Alone ([facility] [place] resident) Available Help at Discharge: Skilled Nursing Facility Prior Function Level of Independence: Independent Communication Communication: No difficulties    Cognition  Overall Cognitive Status: Appears within functional limits for tasks assessed/performed Arousal/Alertness: Awake/alert Orientation Level: Appears intact for tasks assessed Behavior During Session: [facility] for tasks performed Cognition - Other Comments: Pt with apparent memory deficits with multiple repetitions of information required but pt very cooperative    Extremity/Trunk Assessment Right Upper Extremity Assessment RUE ROM/Strength/Tone: Within functional levels RUE Coordination: WFL - gross motor Left Upper Extremity Assessment LUE ROM/Strength/Tone: Within functional levels LUE Coordination: WFL - gross motor Right Lower Extremity Assessment RLE ROM/Strength/Tone: Within functional levels RLE Coordination: WFL - gross motor Left Lower Extremity Assessment LLE ROM/Strength/Tone: Deficits LLE ROM/Strength/Tone Deficits: -10 - 40 AAROM at knee; 2/5 quad strength   Balance    End of  Session PT - End of Session Equipment  Utilized During Treatment: Left knee immobilizer Activity Tolerance: Patient tolerated treatment well Patient left: in chair;with call bell/phone within reach;with family/visitor present Nurse Communication: Mobility status   Oceane Fosse 12/14/2011, 11:54 AM

## 2011-12-15 LAB — CBC
HCT: 36.1 % (ref 36.0–46.0)
Hemoglobin: 11.5 g/dL — ABNORMAL LOW (ref 12.0–15.0)
MCH: 25.9 pg — ABNORMAL LOW (ref 26.0–34.0)
MCV: 81.3 fL (ref 78.0–100.0)
RBC: 4.44 MIL/uL (ref 3.87–5.11)

## 2011-12-15 NOTE — Progress Notes (Signed)
Physical Therapy Treatment Patient Details Name: Janet Hernandez MRN: [id number] DOB: [birth year] Today's Date: 12/15/2011 Time: 4098-1191 PT Time Calculation (min): 25 min  PT Assessment / Plan / Recommendation Comments on Treatment Session  Performed all supine TKR TE's while in bed, limited tolerance 2nd pain level despite premedication.  Sister present and observed. Will get pt OOB to amb this second session.    Follow Up Recommendations       Equipment Recommendations       Frequency     Plan Discharge plan remains appropriate    Precautions / Restrictions Precautions Precautions: Knee Precaution Comments: Pt and sister instructed on KI use for amb and proper application Required Braces or Orthoses: Knee Immobilizer - Left Knee Immobilizer - Left: Discontinue once straight leg raise with < 10 degree lag Restrictions Weight Bearing Restrictions: No Other Position/Activity Restrictions: WBAT   Pertinent Vitals/Pain     Mobility    Tx session focused on TKR TE's   Exercises Total Joint Exercises Ankle Circles/Pumps: AROM;Both;15 reps;Supine Quad Sets: AROM;Both;10 reps;Supine Gluteal Sets: AROM;Both;10 reps;Supine Towel Squeeze: AROM;Both;10 reps;Supine Heel Slides: AAROM;Left;10 reps;Supine Hip ABduction/ADduction: AROM;Left;10 reps;Supine Straight Leg Raises: AAROM;Left;10 reps;Supine   PT Goals Acute Rehab PT Goals PT Goal Formulation: With patient Pt will go Supine/Side to Sit: with supervision PT Goal: Supine/Side to Sit - Progress: Progressing toward goal Pt will go Sit to Supine/Side: with supervision PT Goal: Sit to Supine/Side - Progress: Progressing toward goal Pt will go Sit to Stand: with supervision PT Goal: Sit to Stand - Progress: Progressing toward goal Pt will go Stand to Sit: with supervision PT Goal: Stand to Sit - Progress: Progressing toward goal Pt will Ambulate: 51 - 150 feet;with supervision;with rolling walker PT Goal: Ambulate -  Progress: Progressing toward goal  Visit Information  Last PT Received On: 12/15/11 Assistance Needed: +1    Subjective Data  Subjective: I can't remember if I took pain meds Patient Stated Goal: no pain   Cognition  Overall Cognitive Status: Appears within functional limits for tasks assessed/performed Orientation Level: Appears intact for tasks assessed Behavior During Session: [facility] for tasks performed Cognition - Other Comments: appears slightly foggy in cognition, pleasant, cooperative    Balance     End of Session PT - End of Session Activity Tolerance: Patient limited by pain Patient left: in bed;with call bell/phone within reach;with family/visitor present;Other (comment) (ICE to L knee)    Felecia Shelling  PTA [facility]  Acute  Rehab Pager     [phone]

## 2011-12-15 NOTE — Progress Notes (Signed)
Subjective: 2 Days Post-Op Procedure(s) (LRB): TOTAL KNEE ARTHROPLASTY (Left) Patient reports pain as moderate.  Patient has complaints of knee pain and difficulty moving  Objective: Vital signs in last 24 hours: Temp:  [97.4 F (36.3 C)-99.9 F (37.7 C)] 99.6 F (37.6 C) (04/20 0536) Pulse Rate:  [75-94] 94  (04/20 0536) Resp:  [16-18] 16  (04/20 0536) BP: (109-138)/(69-80) 123/74 mmHg (04/20 0536) SpO2:  [90 %-93 %] 90 % (04/20 0536)  Intake/Output from previous day:  Intake/Output Summary (Last 24 hours) at 12/15/11 0843 Last data filed at 12/15/11 0600  Gross per 24 hour  Intake   1140 ml  Output   2650 ml  Net  -1510 ml    Intake/Output this shift:    Labs:  Basename 12/15/11 0405 12/14/11 0355  HGB 11.5* 11.6*    Basename 12/15/11 0405 12/14/11 0355  WBC 10.5 9.7  RBC 4.44 4.52  HCT 36.1 37.0  PLT 206 221    Basename 12/14/11 0355  NA 135  K 3.6  CL 102  CO2 25  BUN 14  CREATININE 0.71  GLUCOSE 143*  CALCIUM 8.8   No results found for this basename: LABPT:2,INR:2 in the last 72 hours  Exam - Neurologically intact Neurovascular intact Incision: no drainage Compartment soft Dressing/Incision - clean, dry, no drainage Motor function intact - moving foot and toes well on exam.   Past Medical History  Diagnosis Date  . Arthritis   . Rash     inner legs- ? med allergy- to see PCP if doesnt improve by end of week  . Overactive bladder     Assessment/Plan: 2 Days Post-Op Procedure(s) (LRB): TOTAL KNEE ARTHROPLASTY (Left) Active Problems:  * No active hospital problems. *    Advance diet Up with therapy Discharge to SNF early in week. [facility]  DVT Prophylaxis - Xarelto Weight-Bearing as tolerated to left leg  Shatisha Falter V 12/15/2011, 8:43 AM

## 2011-12-15 NOTE — Progress Notes (Signed)
Physical Therapy Treatment Patient Details Name: Janet Hernandez MRN: [id number] DOB: Jan 07, 1930 Today's Date: 12/15/2011 Time: 4782-9562 PT Time Calculation (min): 35 min  PT Assessment / Plan / Recommendation Comments on Treatment Session  Assisted pt OOB to amb.  Required increased time and repeat instructions 2nd "foggy" cognition.  Sister present during session.  Pt plans to D/C to [facility] for ST Rehab.    Follow Up Recommendations  Skilled nursing facility    Equipment Recommendations  Defer to next venue    Frequency     Plan Discharge plan remains appropriate    Precautions / Restrictions Precautions Precautions: Knee Precaution Comments: Pt and sister instructed on KI use for amb and proper application Required Braces or Orthoses: Knee Immobilizer - Left Knee Immobilizer - Left: Discontinue once straight leg raise with < 10 degree lag Restrictions Weight Bearing Restrictions: No Other Position/Activity Restrictions: WBAT   Pertinent Vitals/Pain     Mobility  Bed Mobility Bed Mobility: Supine to Sit Rolling Right: 4: Min assist Details for Bed Mobility Assistance: increased time and HOB elevated 45' Transfers Transfers: Sit to Stand;Stand to Sit Sit to Stand: 3: Mod assist;From bed Stand to Sit: 3: Mod assist;To chair/3-in-1 Details for Transfer Assistance: 50% VC's on hand placement and safety with turn completion prior to sit Ambulation/Gait Ambulation/Gait Assistance: 3: Mod assist Ambulation Distance (Feet): 55 Feet Assistive device: Rolling walker Ambulation/Gait Assistance Details: 50% VC's on sequencing, increased time and safety with turns and backward gait Gait Pattern: Step-to pattern;Decreased stride length;Trunk flexed Gait velocity: slow gait Stairs: No Wheelchair Mobility Wheelchair Mobility: No    Exercises     PT Goals Acute Rehab PT Goals PT Goal Formulation: With patient Potential to Achieve Goals: Fair Pt will go  Supine/Side to Sit: with supervision PT Goal: Supine/Side to Sit - Progress: Progressing toward goal Pt will go Sit to Supine/Side: with supervision PT Goal: Sit to Supine/Side - Progress: Progressing toward goal Pt will go Sit to Stand: with supervision PT Goal: Sit to Stand - Progress: Progressing toward goal Pt will go Stand to Sit: with supervision PT Goal: Stand to Sit - Progress: Progressing toward goal Pt will Ambulate: 51 - 150 feet;with supervision;with rolling walker PT Goal: Ambulate - Progress: Progressing toward goal  Visit Information  Last PT Received On: 12/15/11 Assistance Needed: +1    Subjective Data  Subjective: How long will I be at Rehab? Patient Stated Goal: no pain   Cognition  Overall Cognitive Status: Appears within functional limits for tasks assessed/performed Arousal/Alertness: Awake/alert Orientation Level: Appears intact for tasks assessed Behavior During Session: [facility] for tasks performed Cognition - Other Comments: Appears slightly foggy (meds?) but pleasant and cooperative    Balance     End of Session PT - End of Session Equipment Utilized During Treatment: Gait belt;Left knee immobilizer Activity Tolerance: Patient tolerated treatment well Patient left: in chair    Felecia Shelling  PTA [facility]  Acute  Rehab Pager     [phone]

## 2011-12-15 NOTE — Progress Notes (Signed)
Occupational THerapy Note Chart reviewed. Note plan is for SNF. Will defer OT eval to SNF. Judithann Sauger OTR/L [phone] 12/15/2011

## 2011-12-16 LAB — CBC
HCT: 35.1 % — ABNORMAL LOW (ref 36.0–46.0)
MCHC: 31.3 g/dL (ref 30.0–36.0)
MCV: 82.8 fL (ref 78.0–100.0)
RDW: 21.9 % — ABNORMAL HIGH (ref 11.5–15.5)

## 2011-12-16 NOTE — Progress Notes (Signed)
Physical Therapy Treatment Patient Details Name: Janet Hernandez MRN: [id number] DOB: 1930/04/20 Today's Date: 12/16/2011 Time: 7829-5621 PT Time Calculation (min): 19 min  PT Assessment / Plan / Recommendation Comments on Treatment Session  Pt doing well with exercises, noted some decreased memory due to pt not remembering ambulating with PT earlier in day.     Follow Up Recommendations  Skilled nursing facility    Equipment Recommendations  Defer to next venue    Frequency 7X/week   Plan Discharge plan remains appropriate    Precautions / Restrictions Precautions Precautions: Knee Required Braces or Orthoses: Knee Immobilizer - Left Knee Immobilizer - Left: Discontinue once straight leg raise with < 10 degree lag Restrictions Weight Bearing Restrictions: No Other Position/Activity Restrictions: WBAT   Pertinent Vitals/Pain 3/10    Mobility  Transfers Transfers: Sit to Stand;Stand to Sit Sit to Stand: 3: Mod assist;With upper extremity assist;With armrests;From chair/3-in-1 Stand to Sit: 4: Min assist;With upper extremity assist;With armrests;To chair/3-in-1 Details for Transfer Assistance: Cues for hand placement and LE management with increased assist to rise with min assist for controlled descent when sitting.  Ambulation/Gait Ambulation/Gait Assistance: 4: Min assist;3: Mod assist Ambulation Distance (Feet): 80 Feet Assistive device: Rolling walker Ambulation/Gait Assistance Details: Cues for sequencing/technique with RW due to pt tendency to step past RW when ambulating.  Cues to turn towards "strong" side.   Gait Pattern: Step-to pattern;Decreased stride length;Trunk flexed Gait velocity: slow gait    Exercises Total Joint Exercises Ankle Circles/Pumps: AROM;Both;Supine;20 reps Quad Sets: AROM;10 reps;Supine;Left Heel Slides: AAROM;Left;10 reps;Supine Hip ABduction/ADduction: Left;10 reps;Supine;AAROM Straight Leg Raises: AAROM;Left;10 reps;Supine   PT  Goals Acute Rehab PT Goals PT Goal Formulation: With patient Time For Goal Achievement: 12/21/11 Potential to Achieve Goals: Fair Pt will go Sit to Stand: with supervision PT Goal: Sit to Stand - Progress: Progressing toward goal Pt will go Stand to Sit: with supervision PT Goal: Stand to Sit - Progress: Progressing toward goal Pt will Ambulate: 51 - 150 feet;with supervision;with rolling walker PT Goal: Ambulate - Progress: Progressing toward goal  Visit Information  Last PT Received On: 12/16/11 Assistance Needed: +1    Subjective Data  Subjective: I don't want to do exercises, but I know I need to.   Patient Stated Goal: to get better.    Cognition  Overall Cognitive Status: Appears within functional limits for tasks assessed/performed Arousal/Alertness: Awake/alert Orientation Level: Appears intact for tasks assessed Behavior During Session: [facility] for tasks performed Cognition - Other Comments: Continues to have slight cognition issues, but cooperative.    Balance     End of Session PT - End of Session Equipment Utilized During Treatment: Gait belt;Left knee immobilizer Activity Tolerance: Patient tolerated treatment well Patient left: in bed;with call bell/phone within reach Nurse Communication: Mobility status CPM Left Knee CPM Left Knee: On    Lessie Dings 12/16/2011, 4:23 PM

## 2011-12-16 NOTE — Progress Notes (Signed)
Physical Therapy Treatment Patient Details Name: Janet Hernandez MRN: [id number] DOB: 1929-11-14 Today's Date: 12/16/2011 Time: 8469-6295 PT Time Calculation (min): 46 min  PT Assessment / Plan / Recommendation Comments on Treatment Session  Pt doing well with ambulation with min cues for technique, however requires increased assist for sit to/from stand and for imbalance.      Follow Up Recommendations  Skilled nursing facility    Equipment Recommendations  Defer to next venue    Frequency 7X/week   Plan Discharge plan remains appropriate    Precautions / Restrictions Precautions Precautions: Knee Required Braces or Orthoses: Knee Immobilizer - Left Knee Immobilizer - Left: Discontinue once straight leg raise with < 10 degree lag Restrictions Weight Bearing Restrictions: No Other Position/Activity Restrictions: WBAT   Pertinent Vitals/Pain 0/10    Mobility  Transfers Transfers: Sit to Stand;Stand to Sit Sit to Stand: 3: Mod assist;With upper extremity assist;With armrests;From chair/3-in-1 Stand to Sit: 4: Min assist;With upper extremity assist;With armrests;To chair/3-in-1 Details for Transfer Assistance: Cues for hand placement and LE management with increased assist to rise with min assist for controlled descent when sitting.  Ambulation/Gait Ambulation/Gait Assistance: 4: Min assist;3: Mod assist Ambulation Distance (Feet): 80 Feet Assistive device: Rolling walker Ambulation/Gait Assistance Details: Cues for sequencing/technique with RW due to pt tendency to step past RW when ambulating.  Cues to turn towards "strong" side.   Gait Pattern: Step-to pattern;Decreased stride length;Trunk flexed Gait velocity: slow gait    Exercises     PT Goals Acute Rehab PT Goals PT Goal Formulation: With patient Time For Goal Achievement: 12/21/11 Potential to Achieve Goals: Fair Pt will go Sit to Stand: with supervision PT Goal: Sit to Stand - Progress: Progressing toward  goal Pt will go Stand to Sit: with supervision PT Goal: Stand to Sit - Progress: Progressing toward goal Pt will Ambulate: 51 - 150 feet;with supervision;with rolling walker PT Goal: Ambulate - Progress: Progressing toward goal  Visit Information  Last PT Received On: 12/16/11 Assistance Needed: +1    Subjective Data  Subjective: I'm doing better now that I was earlier this morning.  Patient Stated Goal: no pain   Cognition  Overall Cognitive Status: Appears within functional limits for tasks assessed/performed Arousal/Alertness: Awake/alert Orientation Level: Appears intact for tasks assessed Behavior During Session: [facility] for tasks performed Cognition - Other Comments: Continues to have slight cognition issues, but cooperative.    Balance     End of Session PT - End of Session Equipment Utilized During Treatment: Gait belt;Left knee immobilizer Activity Tolerance: Patient tolerated treatment well Patient left: in chair;with call bell/phone within reach Nurse Communication: Mobility status    Page, Meribeth Mattes 12/16/2011, 12:48 PM

## 2011-12-16 NOTE — Progress Notes (Signed)
Subjective: 3 Days Post-Op Procedure(s) (LRB): TOTAL KNEE ARTHROPLASTY (Left) Patient reports pain as mild.   Patient has complaints of some pain but a little better today.  No bowel movement yet but voiding.  Plan is to go to [facility] tomorrow.  Continue therapy and transfer tomorrow.  Objective: Vital signs in last 24 hours: Temp:  [97.8 F (36.6 C)-99.3 F (37.4 C)] 99.3 F (37.4 C) (04/21 0552) Pulse Rate:  [86-90] 90  (04/21 0552) Resp:  [18-20] 18  (04/21 0552) BP: (106-139)/(64-83) 139/83 mmHg (04/21 0552) SpO2:  [94 %-96 %] 96 % (04/21 0552)  Intake/Output from previous day:  Intake/Output Summary (Last 24 hours) at 12/16/11 0919 Last data filed at 12/16/11 0654  Gross per 24 hour  Intake 2587.5 ml  Output   1800 ml  Net  787.5 ml    Intake/Output this shift:    Labs:  Basename 12/16/11 0419 12/15/11 0405 12/14/11 0355  HGB 11.0* 11.5* 11.6*    Basename 12/16/11 0419 12/15/11 0405  WBC 9.0 10.5  RBC 4.24 4.44  HCT 35.1* 36.1  PLT 187 206    Basename 12/14/11 0355  NA 135  K 3.6  CL 102  CO2 25  BUN 14  CREATININE 0.71  GLUCOSE 143*  CALCIUM 8.8   No results found for this basename: LABPT:2,INR:2 in the last 72 hours  Exam - Neurovascular intact Sensation intact distally Dressing/Incision - clean, dry, no drainage Motor function intact - moving foot and toes well on exam.   Past Medical History  Diagnosis Date  . Arthritis   . Rash     inner legs- ? med allergy- to see PCP if doesnt improve by end of week  . Overactive bladder     Assessment/Plan: 3 Days Post-Op Procedure(s) (LRB): TOTAL KNEE ARTHROPLASTY (Left) Active Problems:  * No active hospital problems. *    Up with therapy Plan for discharge tomorrow Discharge to SNF  DVT Prophylaxis - Xarelto Weight-Bearing as tolerated to left leg  Yonna Alwin 12/16/2011, 9:19 AM

## 2011-12-17 DIAGNOSIS — R269 Unspecified abnormalities of gait and mobility: Secondary | ICD-10-CM | POA: Diagnosis not present

## 2011-12-17 DIAGNOSIS — N318 Other neuromuscular dysfunction of bladder: Secondary | ICD-10-CM | POA: Diagnosis not present

## 2011-12-17 DIAGNOSIS — Z471 Aftercare following joint replacement surgery: Secondary | ICD-10-CM | POA: Diagnosis not present

## 2011-12-17 DIAGNOSIS — K5900 Constipation, unspecified: Secondary | ICD-10-CM | POA: Diagnosis not present

## 2011-12-17 DIAGNOSIS — D649 Anemia, unspecified: Secondary | ICD-10-CM | POA: Diagnosis not present

## 2011-12-17 DIAGNOSIS — M48062 Spinal stenosis, lumbar region with neurogenic claudication: Secondary | ICD-10-CM | POA: Diagnosis not present

## 2011-12-17 DIAGNOSIS — M25569 Pain in unspecified knee: Secondary | ICD-10-CM | POA: Diagnosis not present

## 2011-12-17 DIAGNOSIS — M1710 Unilateral primary osteoarthritis, unspecified knee: Secondary | ICD-10-CM | POA: Diagnosis not present

## 2011-12-17 DIAGNOSIS — Z96659 Presence of unspecified artificial knee joint: Secondary | ICD-10-CM | POA: Diagnosis not present

## 2011-12-17 DIAGNOSIS — R413 Other amnesia: Secondary | ICD-10-CM | POA: Diagnosis not present

## 2011-12-17 DIAGNOSIS — E785 Hyperlipidemia, unspecified: Secondary | ICD-10-CM | POA: Diagnosis not present

## 2011-12-17 DIAGNOSIS — K219 Gastro-esophageal reflux disease without esophagitis: Secondary | ICD-10-CM | POA: Diagnosis not present

## 2011-12-17 DIAGNOSIS — H8110 Benign paroxysmal vertigo, unspecified ear: Secondary | ICD-10-CM | POA: Diagnosis not present

## 2011-12-17 DIAGNOSIS — M545 Low back pain: Secondary | ICD-10-CM | POA: Diagnosis not present

## 2011-12-17 DIAGNOSIS — J309 Allergic rhinitis, unspecified: Secondary | ICD-10-CM | POA: Diagnosis not present

## 2011-12-17 DIAGNOSIS — M1990 Unspecified osteoarthritis, unspecified site: Secondary | ICD-10-CM | POA: Diagnosis not present

## 2011-12-17 DIAGNOSIS — M961 Postlaminectomy syndrome, not elsewhere classified: Secondary | ICD-10-CM | POA: Diagnosis not present

## 2011-12-17 DIAGNOSIS — M6281 Muscle weakness (generalized): Secondary | ICD-10-CM | POA: Diagnosis not present

## 2011-12-17 DIAGNOSIS — R279 Unspecified lack of coordination: Secondary | ICD-10-CM | POA: Diagnosis not present

## 2011-12-17 DIAGNOSIS — B3749 Other urogenital candidiasis: Secondary | ICD-10-CM | POA: Diagnosis not present

## 2011-12-17 DIAGNOSIS — R488 Other symbolic dysfunctions: Secondary | ICD-10-CM | POA: Diagnosis not present

## 2011-12-17 DIAGNOSIS — S8990XA Unspecified injury of unspecified lower leg, initial encounter: Secondary | ICD-10-CM | POA: Diagnosis not present

## 2011-12-17 NOTE — Progress Notes (Signed)
Physical Therapy Treatment Patient Details Name: MALAIAH VIRAMONTES MRN: [id number] DOB: 05-18-1930 Today's Date: 12/17/2011 Time: 0827-0903 PT Time Calculation (min): 36 min  PT Assessment / Plan / Recommendation Comments on Treatment Session  Pt with increased c/o pain in R knee at rest and during ambulation.  Pt to D/C today.      Follow Up Recommendations  Skilled nursing facility    Equipment Recommendations  Defer to next venue    Frequency 7X/week   Plan Discharge plan remains appropriate    Precautions / Restrictions Precautions Precautions: Knee Required Braces or Orthoses: Knee Immobilizer - Left Knee Immobilizer - Left: Discontinue once straight leg raise with < 10 degree lag Restrictions Weight Bearing Restrictions: No Other Position/Activity Restrictions: WBAT   Pertinent Vitals/Pain 4/10    Mobility  Bed Mobility Bed Mobility: Supine to Sit Supine to Sit: 4: Min assist Details for Bed Mobility Assistance: Cues for using UE for self assisting and for sequence.  Transfers Transfers: Sit to Stand;Stand to Sit Sit to Stand: 3: Mod assist;4: Min assist;With upper extremity assist;From elevated surface;From bed Stand to Sit: 4: Min assist;With upper extremity assist;With armrests;To chair/3-in-1 Details for Transfer Assistance: Continue to provide cues for hand placement and LE management.   Ambulation/Gait Ambulation/Gait Assistance: 4: Min guard Ambulation Distance (Feet): 15 Feet (x 2 reps) Assistive device: Rolling walker Ambulation/Gait Assistance Details: cues for sequencing/technique due to tendency to step past RW.   Gait Pattern: Step-to pattern;Decreased stride length;Trunk flexed Gait velocity: slow gait    Exercises     PT Goals Acute Rehab PT Goals PT Goal Formulation: With patient Time For Goal Achievement: 12/21/11 Potential to Achieve Goals: Fair Pt will go Supine/Side to Sit: with supervision PT Goal: Supine/Side to Sit - Progress:  Progressing toward goal Pt will go Sit to Stand: with supervision PT Goal: Sit to Stand - Progress: Progressing toward goal Pt will go Stand to Sit: with supervision PT Goal: Stand to Sit - Progress: Progressing toward goal Pt will Ambulate: 51 - 150 feet;with supervision;with rolling walker PT Goal: Ambulate - Progress: Progressing toward goal  Visit Information  Last PT Received On: 12/17/11 Assistance Needed: +1    Subjective Data  Subjective: I'm feeling worse today Patient Stated Goal: I want to get out of here   Cognition  Overall Cognitive Status: Impaired Area of Impairment: Memory;Awareness of deficits Arousal/Alertness: Awake/alert Orientation Level: Appears intact for tasks assessed Behavior During Session: [facility] for tasks performed Memory: Decreased recall of precautions Memory Deficits: Decreased recall of safety awareness.  Cognition - Other Comments: Continues to have slight cognition issues, but cooperative.    Balance     End of Session PT - End of Session Equipment Utilized During Treatment: Gait belt;Left knee immobilizer Activity Tolerance: Patient tolerated treatment well Patient left: in chair;with call bell/phone within reach Nurse Communication: Mobility status    Page, Meribeth Mattes 12/17/2011, 10:46 AM

## 2011-12-17 NOTE — Progress Notes (Signed)
CSW following to assist with d/c planning. Pt plans to d/c to [facility] today via [facility] transport for ST rehab. [name] has no openings, at this time , on their SNF unit. Pt will return to her Independent apt at [facility] once she has completed her rehab.  Cori Razor  LCSW  [phone]

## 2011-12-18 DIAGNOSIS — M545 Low back pain: Secondary | ICD-10-CM | POA: Diagnosis not present

## 2011-12-18 DIAGNOSIS — M1710 Unilateral primary osteoarthritis, unspecified knee: Secondary | ICD-10-CM | POA: Diagnosis not present

## 2011-12-18 DIAGNOSIS — K5900 Constipation, unspecified: Secondary | ICD-10-CM

## 2011-12-18 DIAGNOSIS — Z471 Aftercare following joint replacement surgery: Secondary | ICD-10-CM | POA: Diagnosis not present

## 2011-12-18 DIAGNOSIS — D649 Anemia, unspecified: Secondary | ICD-10-CM | POA: Diagnosis not present

## 2011-12-18 HISTORY — DX: Constipation, unspecified: K59.00

## 2011-12-24 ENCOUNTER — Encounter (HOSPITAL_COMMUNITY): Payer: Self-pay | Admitting: Specialist

## 2011-12-28 DIAGNOSIS — M1710 Unilateral primary osteoarthritis, unspecified knee: Secondary | ICD-10-CM | POA: Diagnosis not present

## 2012-01-01 DIAGNOSIS — Z471 Aftercare following joint replacement surgery: Secondary | ICD-10-CM | POA: Diagnosis not present

## 2012-01-01 DIAGNOSIS — B379 Candidiasis, unspecified: Secondary | ICD-10-CM

## 2012-01-01 DIAGNOSIS — M545 Low back pain: Secondary | ICD-10-CM | POA: Diagnosis not present

## 2012-01-01 DIAGNOSIS — D649 Anemia, unspecified: Secondary | ICD-10-CM | POA: Diagnosis not present

## 2012-01-01 DIAGNOSIS — M1710 Unilateral primary osteoarthritis, unspecified knee: Secondary | ICD-10-CM | POA: Diagnosis not present

## 2012-01-01 DIAGNOSIS — B3749 Other urogenital candidiasis: Secondary | ICD-10-CM | POA: Diagnosis not present

## 2012-01-01 HISTORY — DX: Candidiasis, unspecified: B37.9

## 2012-01-22 DIAGNOSIS — M1990 Unspecified osteoarthritis, unspecified site: Secondary | ICD-10-CM | POA: Diagnosis not present

## 2012-01-22 DIAGNOSIS — R413 Other amnesia: Secondary | ICD-10-CM

## 2012-01-22 DIAGNOSIS — F0390 Unspecified dementia without behavioral disturbance: Secondary | ICD-10-CM | POA: Insufficient documentation

## 2012-01-22 DIAGNOSIS — D649 Anemia, unspecified: Secondary | ICD-10-CM | POA: Diagnosis not present

## 2012-01-22 HISTORY — DX: Other amnesia: R41.3

## 2012-02-01 DIAGNOSIS — M1710 Unilateral primary osteoarthritis, unspecified knee: Secondary | ICD-10-CM | POA: Diagnosis not present

## 2012-02-21 DIAGNOSIS — R413 Other amnesia: Secondary | ICD-10-CM | POA: Diagnosis not present

## 2012-02-21 DIAGNOSIS — D649 Anemia, unspecified: Secondary | ICD-10-CM | POA: Diagnosis not present

## 2012-02-21 DIAGNOSIS — G4700 Insomnia, unspecified: Secondary | ICD-10-CM | POA: Diagnosis not present

## 2012-02-21 DIAGNOSIS — M1990 Unspecified osteoarthritis, unspecified site: Secondary | ICD-10-CM | POA: Diagnosis not present

## 2012-02-25 DIAGNOSIS — R488 Other symbolic dysfunctions: Secondary | ICD-10-CM | POA: Diagnosis not present

## 2012-02-25 DIAGNOSIS — Z471 Aftercare following joint replacement surgery: Secondary | ICD-10-CM | POA: Diagnosis not present

## 2012-02-25 DIAGNOSIS — Z96659 Presence of unspecified artificial knee joint: Secondary | ICD-10-CM | POA: Diagnosis not present

## 2012-02-25 DIAGNOSIS — R29818 Other symptoms and signs involving the nervous system: Secondary | ICD-10-CM | POA: Diagnosis not present

## 2012-02-25 DIAGNOSIS — R269 Unspecified abnormalities of gait and mobility: Secondary | ICD-10-CM | POA: Diagnosis not present

## 2012-02-26 DIAGNOSIS — Z96659 Presence of unspecified artificial knee joint: Secondary | ICD-10-CM | POA: Diagnosis not present

## 2012-02-26 DIAGNOSIS — R488 Other symbolic dysfunctions: Secondary | ICD-10-CM | POA: Diagnosis not present

## 2012-02-26 DIAGNOSIS — R269 Unspecified abnormalities of gait and mobility: Secondary | ICD-10-CM | POA: Diagnosis not present

## 2012-02-26 DIAGNOSIS — Z471 Aftercare following joint replacement surgery: Secondary | ICD-10-CM | POA: Diagnosis not present

## 2012-02-26 DIAGNOSIS — R29818 Other symptoms and signs involving the nervous system: Secondary | ICD-10-CM | POA: Diagnosis not present

## 2012-02-27 DIAGNOSIS — Z96659 Presence of unspecified artificial knee joint: Secondary | ICD-10-CM | POA: Diagnosis not present

## 2012-02-27 DIAGNOSIS — R29818 Other symptoms and signs involving the nervous system: Secondary | ICD-10-CM | POA: Diagnosis not present

## 2012-02-27 DIAGNOSIS — Z471 Aftercare following joint replacement surgery: Secondary | ICD-10-CM | POA: Diagnosis not present

## 2012-02-27 DIAGNOSIS — R269 Unspecified abnormalities of gait and mobility: Secondary | ICD-10-CM | POA: Diagnosis not present

## 2012-02-27 DIAGNOSIS — R488 Other symbolic dysfunctions: Secondary | ICD-10-CM | POA: Diagnosis not present

## 2012-02-29 DIAGNOSIS — R269 Unspecified abnormalities of gait and mobility: Secondary | ICD-10-CM | POA: Diagnosis not present

## 2012-02-29 DIAGNOSIS — Z96659 Presence of unspecified artificial knee joint: Secondary | ICD-10-CM | POA: Diagnosis not present

## 2012-02-29 DIAGNOSIS — R488 Other symbolic dysfunctions: Secondary | ICD-10-CM | POA: Diagnosis not present

## 2012-02-29 DIAGNOSIS — Z471 Aftercare following joint replacement surgery: Secondary | ICD-10-CM | POA: Diagnosis not present

## 2012-02-29 DIAGNOSIS — R29818 Other symptoms and signs involving the nervous system: Secondary | ICD-10-CM | POA: Diagnosis not present

## 2012-03-03 DIAGNOSIS — Z471 Aftercare following joint replacement surgery: Secondary | ICD-10-CM | POA: Diagnosis not present

## 2012-03-03 DIAGNOSIS — R269 Unspecified abnormalities of gait and mobility: Secondary | ICD-10-CM | POA: Diagnosis not present

## 2012-03-03 DIAGNOSIS — Z96659 Presence of unspecified artificial knee joint: Secondary | ICD-10-CM | POA: Diagnosis not present

## 2012-03-03 DIAGNOSIS — R488 Other symbolic dysfunctions: Secondary | ICD-10-CM | POA: Diagnosis not present

## 2012-03-03 DIAGNOSIS — R29818 Other symptoms and signs involving the nervous system: Secondary | ICD-10-CM | POA: Diagnosis not present

## 2012-03-04 DIAGNOSIS — Z96659 Presence of unspecified artificial knee joint: Secondary | ICD-10-CM | POA: Diagnosis not present

## 2012-03-04 DIAGNOSIS — R29818 Other symptoms and signs involving the nervous system: Secondary | ICD-10-CM | POA: Diagnosis not present

## 2012-03-04 DIAGNOSIS — R269 Unspecified abnormalities of gait and mobility: Secondary | ICD-10-CM | POA: Diagnosis not present

## 2012-03-04 DIAGNOSIS — Z471 Aftercare following joint replacement surgery: Secondary | ICD-10-CM | POA: Diagnosis not present

## 2012-03-04 DIAGNOSIS — R488 Other symbolic dysfunctions: Secondary | ICD-10-CM | POA: Diagnosis not present

## 2012-03-05 DIAGNOSIS — R269 Unspecified abnormalities of gait and mobility: Secondary | ICD-10-CM | POA: Diagnosis not present

## 2012-03-05 DIAGNOSIS — Z96659 Presence of unspecified artificial knee joint: Secondary | ICD-10-CM | POA: Diagnosis not present

## 2012-03-05 DIAGNOSIS — Z471 Aftercare following joint replacement surgery: Secondary | ICD-10-CM | POA: Diagnosis not present

## 2012-03-05 DIAGNOSIS — R488 Other symbolic dysfunctions: Secondary | ICD-10-CM | POA: Diagnosis not present

## 2012-03-05 DIAGNOSIS — R29818 Other symptoms and signs involving the nervous system: Secondary | ICD-10-CM | POA: Diagnosis not present

## 2012-03-06 DIAGNOSIS — Z471 Aftercare following joint replacement surgery: Secondary | ICD-10-CM | POA: Diagnosis not present

## 2012-03-06 DIAGNOSIS — R488 Other symbolic dysfunctions: Secondary | ICD-10-CM | POA: Diagnosis not present

## 2012-03-06 DIAGNOSIS — Z96659 Presence of unspecified artificial knee joint: Secondary | ICD-10-CM | POA: Diagnosis not present

## 2012-03-06 DIAGNOSIS — R269 Unspecified abnormalities of gait and mobility: Secondary | ICD-10-CM | POA: Diagnosis not present

## 2012-03-06 DIAGNOSIS — R29818 Other symptoms and signs involving the nervous system: Secondary | ICD-10-CM | POA: Diagnosis not present

## 2012-03-07 DIAGNOSIS — Z96659 Presence of unspecified artificial knee joint: Secondary | ICD-10-CM | POA: Diagnosis not present

## 2012-03-07 DIAGNOSIS — R29818 Other symptoms and signs involving the nervous system: Secondary | ICD-10-CM | POA: Diagnosis not present

## 2012-03-07 DIAGNOSIS — R269 Unspecified abnormalities of gait and mobility: Secondary | ICD-10-CM | POA: Diagnosis not present

## 2012-03-07 DIAGNOSIS — R488 Other symbolic dysfunctions: Secondary | ICD-10-CM | POA: Diagnosis not present

## 2012-03-07 DIAGNOSIS — Z471 Aftercare following joint replacement surgery: Secondary | ICD-10-CM | POA: Diagnosis not present

## 2012-03-10 DIAGNOSIS — R269 Unspecified abnormalities of gait and mobility: Secondary | ICD-10-CM | POA: Diagnosis not present

## 2012-03-10 DIAGNOSIS — Z471 Aftercare following joint replacement surgery: Secondary | ICD-10-CM | POA: Diagnosis not present

## 2012-03-10 DIAGNOSIS — Z96659 Presence of unspecified artificial knee joint: Secondary | ICD-10-CM | POA: Diagnosis not present

## 2012-03-10 DIAGNOSIS — R29818 Other symptoms and signs involving the nervous system: Secondary | ICD-10-CM | POA: Diagnosis not present

## 2012-03-10 DIAGNOSIS — R488 Other symbolic dysfunctions: Secondary | ICD-10-CM | POA: Diagnosis not present

## 2012-03-11 DIAGNOSIS — R269 Unspecified abnormalities of gait and mobility: Secondary | ICD-10-CM | POA: Diagnosis not present

## 2012-03-11 DIAGNOSIS — Z471 Aftercare following joint replacement surgery: Secondary | ICD-10-CM | POA: Diagnosis not present

## 2012-03-11 DIAGNOSIS — R488 Other symbolic dysfunctions: Secondary | ICD-10-CM | POA: Diagnosis not present

## 2012-03-11 DIAGNOSIS — R29818 Other symptoms and signs involving the nervous system: Secondary | ICD-10-CM | POA: Diagnosis not present

## 2012-03-11 DIAGNOSIS — Z96659 Presence of unspecified artificial knee joint: Secondary | ICD-10-CM | POA: Diagnosis not present

## 2012-03-12 DIAGNOSIS — R488 Other symbolic dysfunctions: Secondary | ICD-10-CM | POA: Diagnosis not present

## 2012-03-12 DIAGNOSIS — Z96659 Presence of unspecified artificial knee joint: Secondary | ICD-10-CM | POA: Diagnosis not present

## 2012-03-12 DIAGNOSIS — R269 Unspecified abnormalities of gait and mobility: Secondary | ICD-10-CM | POA: Diagnosis not present

## 2012-03-12 DIAGNOSIS — R29818 Other symptoms and signs involving the nervous system: Secondary | ICD-10-CM | POA: Diagnosis not present

## 2012-03-12 DIAGNOSIS — Z471 Aftercare following joint replacement surgery: Secondary | ICD-10-CM | POA: Diagnosis not present

## 2012-03-13 DIAGNOSIS — R269 Unspecified abnormalities of gait and mobility: Secondary | ICD-10-CM | POA: Diagnosis not present

## 2012-03-13 DIAGNOSIS — Z471 Aftercare following joint replacement surgery: Secondary | ICD-10-CM | POA: Diagnosis not present

## 2012-03-13 DIAGNOSIS — R29818 Other symptoms and signs involving the nervous system: Secondary | ICD-10-CM | POA: Diagnosis not present

## 2012-03-13 DIAGNOSIS — R488 Other symbolic dysfunctions: Secondary | ICD-10-CM | POA: Diagnosis not present

## 2012-03-13 DIAGNOSIS — Z96659 Presence of unspecified artificial knee joint: Secondary | ICD-10-CM | POA: Diagnosis not present

## 2012-03-14 DIAGNOSIS — Z471 Aftercare following joint replacement surgery: Secondary | ICD-10-CM | POA: Diagnosis not present

## 2012-03-14 DIAGNOSIS — R269 Unspecified abnormalities of gait and mobility: Secondary | ICD-10-CM | POA: Diagnosis not present

## 2012-03-14 DIAGNOSIS — R488 Other symbolic dysfunctions: Secondary | ICD-10-CM | POA: Diagnosis not present

## 2012-03-14 DIAGNOSIS — Z96659 Presence of unspecified artificial knee joint: Secondary | ICD-10-CM | POA: Diagnosis not present

## 2012-03-14 DIAGNOSIS — R29818 Other symptoms and signs involving the nervous system: Secondary | ICD-10-CM | POA: Diagnosis not present

## 2012-03-15 DIAGNOSIS — Z471 Aftercare following joint replacement surgery: Secondary | ICD-10-CM | POA: Diagnosis not present

## 2012-03-15 DIAGNOSIS — R488 Other symbolic dysfunctions: Secondary | ICD-10-CM | POA: Diagnosis not present

## 2012-03-15 DIAGNOSIS — Z96659 Presence of unspecified artificial knee joint: Secondary | ICD-10-CM | POA: Diagnosis not present

## 2012-03-15 DIAGNOSIS — R29818 Other symptoms and signs involving the nervous system: Secondary | ICD-10-CM | POA: Diagnosis not present

## 2012-03-15 DIAGNOSIS — R269 Unspecified abnormalities of gait and mobility: Secondary | ICD-10-CM | POA: Diagnosis not present

## 2012-03-17 DIAGNOSIS — R29818 Other symptoms and signs involving the nervous system: Secondary | ICD-10-CM | POA: Diagnosis not present

## 2012-03-17 DIAGNOSIS — R488 Other symbolic dysfunctions: Secondary | ICD-10-CM | POA: Diagnosis not present

## 2012-03-17 DIAGNOSIS — Z471 Aftercare following joint replacement surgery: Secondary | ICD-10-CM | POA: Diagnosis not present

## 2012-03-17 DIAGNOSIS — R269 Unspecified abnormalities of gait and mobility: Secondary | ICD-10-CM | POA: Diagnosis not present

## 2012-03-17 DIAGNOSIS — Z96659 Presence of unspecified artificial knee joint: Secondary | ICD-10-CM | POA: Diagnosis not present

## 2012-03-18 DIAGNOSIS — Z96659 Presence of unspecified artificial knee joint: Secondary | ICD-10-CM | POA: Diagnosis not present

## 2012-03-18 DIAGNOSIS — R29818 Other symptoms and signs involving the nervous system: Secondary | ICD-10-CM | POA: Diagnosis not present

## 2012-03-18 DIAGNOSIS — R488 Other symbolic dysfunctions: Secondary | ICD-10-CM | POA: Diagnosis not present

## 2012-03-18 DIAGNOSIS — Z471 Aftercare following joint replacement surgery: Secondary | ICD-10-CM | POA: Diagnosis not present

## 2012-03-18 DIAGNOSIS — R269 Unspecified abnormalities of gait and mobility: Secondary | ICD-10-CM | POA: Diagnosis not present

## 2012-03-19 DIAGNOSIS — Z471 Aftercare following joint replacement surgery: Secondary | ICD-10-CM | POA: Diagnosis not present

## 2012-03-19 DIAGNOSIS — R269 Unspecified abnormalities of gait and mobility: Secondary | ICD-10-CM | POA: Diagnosis not present

## 2012-03-19 DIAGNOSIS — R29818 Other symptoms and signs involving the nervous system: Secondary | ICD-10-CM | POA: Diagnosis not present

## 2012-03-19 DIAGNOSIS — Z96659 Presence of unspecified artificial knee joint: Secondary | ICD-10-CM | POA: Diagnosis not present

## 2012-03-19 DIAGNOSIS — R488 Other symbolic dysfunctions: Secondary | ICD-10-CM | POA: Diagnosis not present

## 2012-03-20 DIAGNOSIS — Z96659 Presence of unspecified artificial knee joint: Secondary | ICD-10-CM | POA: Diagnosis not present

## 2012-03-20 DIAGNOSIS — R29818 Other symptoms and signs involving the nervous system: Secondary | ICD-10-CM | POA: Diagnosis not present

## 2012-03-20 DIAGNOSIS — R488 Other symbolic dysfunctions: Secondary | ICD-10-CM | POA: Diagnosis not present

## 2012-03-20 DIAGNOSIS — Z471 Aftercare following joint replacement surgery: Secondary | ICD-10-CM | POA: Diagnosis not present

## 2012-03-20 DIAGNOSIS — R269 Unspecified abnormalities of gait and mobility: Secondary | ICD-10-CM | POA: Diagnosis not present

## 2012-03-24 DIAGNOSIS — R29818 Other symptoms and signs involving the nervous system: Secondary | ICD-10-CM | POA: Diagnosis not present

## 2012-03-24 DIAGNOSIS — Z96659 Presence of unspecified artificial knee joint: Secondary | ICD-10-CM | POA: Diagnosis not present

## 2012-03-24 DIAGNOSIS — R488 Other symbolic dysfunctions: Secondary | ICD-10-CM | POA: Diagnosis not present

## 2012-03-24 DIAGNOSIS — Z471 Aftercare following joint replacement surgery: Secondary | ICD-10-CM | POA: Diagnosis not present

## 2012-03-24 DIAGNOSIS — R269 Unspecified abnormalities of gait and mobility: Secondary | ICD-10-CM | POA: Diagnosis not present

## 2012-03-25 DIAGNOSIS — Z471 Aftercare following joint replacement surgery: Secondary | ICD-10-CM | POA: Diagnosis not present

## 2012-03-25 DIAGNOSIS — Z96659 Presence of unspecified artificial knee joint: Secondary | ICD-10-CM | POA: Diagnosis not present

## 2012-03-25 DIAGNOSIS — R269 Unspecified abnormalities of gait and mobility: Secondary | ICD-10-CM | POA: Diagnosis not present

## 2012-03-25 DIAGNOSIS — R29818 Other symptoms and signs involving the nervous system: Secondary | ICD-10-CM | POA: Diagnosis not present

## 2012-03-25 DIAGNOSIS — R488 Other symbolic dysfunctions: Secondary | ICD-10-CM | POA: Diagnosis not present

## 2012-03-27 DIAGNOSIS — R262 Difficulty in walking, not elsewhere classified: Secondary | ICD-10-CM | POA: Diagnosis not present

## 2012-03-27 DIAGNOSIS — R279 Unspecified lack of coordination: Secondary | ICD-10-CM | POA: Diagnosis not present

## 2012-03-27 DIAGNOSIS — R269 Unspecified abnormalities of gait and mobility: Secondary | ICD-10-CM | POA: Diagnosis not present

## 2012-03-27 DIAGNOSIS — M6281 Muscle weakness (generalized): Secondary | ICD-10-CM | POA: Diagnosis not present

## 2012-03-28 DIAGNOSIS — R279 Unspecified lack of coordination: Secondary | ICD-10-CM | POA: Diagnosis not present

## 2012-03-28 DIAGNOSIS — R262 Difficulty in walking, not elsewhere classified: Secondary | ICD-10-CM | POA: Diagnosis not present

## 2012-03-28 DIAGNOSIS — R269 Unspecified abnormalities of gait and mobility: Secondary | ICD-10-CM | POA: Diagnosis not present

## 2012-03-28 DIAGNOSIS — M6281 Muscle weakness (generalized): Secondary | ICD-10-CM | POA: Diagnosis not present

## 2012-03-31 DIAGNOSIS — M6281 Muscle weakness (generalized): Secondary | ICD-10-CM | POA: Diagnosis not present

## 2012-03-31 DIAGNOSIS — R279 Unspecified lack of coordination: Secondary | ICD-10-CM | POA: Diagnosis not present

## 2012-03-31 DIAGNOSIS — R262 Difficulty in walking, not elsewhere classified: Secondary | ICD-10-CM | POA: Diagnosis not present

## 2012-03-31 DIAGNOSIS — R269 Unspecified abnormalities of gait and mobility: Secondary | ICD-10-CM | POA: Diagnosis not present

## 2012-04-02 DIAGNOSIS — R269 Unspecified abnormalities of gait and mobility: Secondary | ICD-10-CM | POA: Diagnosis not present

## 2012-04-02 DIAGNOSIS — R262 Difficulty in walking, not elsewhere classified: Secondary | ICD-10-CM | POA: Diagnosis not present

## 2012-04-02 DIAGNOSIS — R279 Unspecified lack of coordination: Secondary | ICD-10-CM | POA: Diagnosis not present

## 2012-04-02 DIAGNOSIS — M6281 Muscle weakness (generalized): Secondary | ICD-10-CM | POA: Diagnosis not present

## 2012-04-03 DIAGNOSIS — R279 Unspecified lack of coordination: Secondary | ICD-10-CM | POA: Diagnosis not present

## 2012-04-03 DIAGNOSIS — M6281 Muscle weakness (generalized): Secondary | ICD-10-CM | POA: Diagnosis not present

## 2012-04-03 DIAGNOSIS — R269 Unspecified abnormalities of gait and mobility: Secondary | ICD-10-CM | POA: Diagnosis not present

## 2012-04-03 DIAGNOSIS — R262 Difficulty in walking, not elsewhere classified: Secondary | ICD-10-CM | POA: Diagnosis not present

## 2012-04-04 DIAGNOSIS — R269 Unspecified abnormalities of gait and mobility: Secondary | ICD-10-CM | POA: Diagnosis not present

## 2012-04-04 DIAGNOSIS — R262 Difficulty in walking, not elsewhere classified: Secondary | ICD-10-CM | POA: Diagnosis not present

## 2012-04-04 DIAGNOSIS — R279 Unspecified lack of coordination: Secondary | ICD-10-CM | POA: Diagnosis not present

## 2012-04-04 DIAGNOSIS — M6281 Muscle weakness (generalized): Secondary | ICD-10-CM | POA: Diagnosis not present

## 2012-04-14 DIAGNOSIS — M1710 Unilateral primary osteoarthritis, unspecified knee: Secondary | ICD-10-CM | POA: Diagnosis not present

## 2012-04-17 DIAGNOSIS — M545 Low back pain: Secondary | ICD-10-CM | POA: Diagnosis not present

## 2012-04-17 DIAGNOSIS — D649 Anemia, unspecified: Secondary | ICD-10-CM | POA: Diagnosis not present

## 2012-04-22 DIAGNOSIS — Z1231 Encounter for screening mammogram for malignant neoplasm of breast: Secondary | ICD-10-CM | POA: Diagnosis not present

## 2012-05-07 DIAGNOSIS — M1710 Unilateral primary osteoarthritis, unspecified knee: Secondary | ICD-10-CM | POA: Diagnosis not present

## 2012-05-12 DIAGNOSIS — M1710 Unilateral primary osteoarthritis, unspecified knee: Secondary | ICD-10-CM | POA: Diagnosis not present

## 2012-05-28 DIAGNOSIS — M1710 Unilateral primary osteoarthritis, unspecified knee: Secondary | ICD-10-CM | POA: Diagnosis not present

## 2012-05-30 DIAGNOSIS — R3915 Urgency of urination: Secondary | ICD-10-CM | POA: Diagnosis not present

## 2012-06-10 DIAGNOSIS — M1710 Unilateral primary osteoarthritis, unspecified knee: Secondary | ICD-10-CM | POA: Diagnosis not present

## 2012-06-20 DIAGNOSIS — M1710 Unilateral primary osteoarthritis, unspecified knee: Secondary | ICD-10-CM | POA: Diagnosis not present

## 2012-06-23 DIAGNOSIS — M1710 Unilateral primary osteoarthritis, unspecified knee: Secondary | ICD-10-CM | POA: Diagnosis not present

## 2012-06-25 DIAGNOSIS — M1710 Unilateral primary osteoarthritis, unspecified knee: Secondary | ICD-10-CM | POA: Diagnosis not present

## 2012-06-27 DIAGNOSIS — M1710 Unilateral primary osteoarthritis, unspecified knee: Secondary | ICD-10-CM | POA: Diagnosis not present

## 2012-06-30 DIAGNOSIS — M1710 Unilateral primary osteoarthritis, unspecified knee: Secondary | ICD-10-CM | POA: Diagnosis not present

## 2012-07-03 DIAGNOSIS — M1710 Unilateral primary osteoarthritis, unspecified knee: Secondary | ICD-10-CM | POA: Diagnosis not present

## 2012-07-04 DIAGNOSIS — H43819 Vitreous degeneration, unspecified eye: Secondary | ICD-10-CM | POA: Diagnosis not present

## 2012-07-07 DIAGNOSIS — Z23 Encounter for immunization: Secondary | ICD-10-CM | POA: Diagnosis not present

## 2012-07-08 DIAGNOSIS — M7650 Patellar tendinitis, unspecified knee: Secondary | ICD-10-CM | POA: Diagnosis not present

## 2012-07-08 DIAGNOSIS — M1710 Unilateral primary osteoarthritis, unspecified knee: Secondary | ICD-10-CM | POA: Diagnosis not present

## 2012-07-17 DIAGNOSIS — R82998 Other abnormal findings in urine: Secondary | ICD-10-CM | POA: Diagnosis not present

## 2012-07-17 DIAGNOSIS — R339 Retention of urine, unspecified: Secondary | ICD-10-CM | POA: Diagnosis not present

## 2012-07-22 DIAGNOSIS — M7650 Patellar tendinitis, unspecified knee: Secondary | ICD-10-CM | POA: Diagnosis not present

## 2012-08-01 ENCOUNTER — Other Ambulatory Visit: Payer: Self-pay | Admitting: Nurse Practitioner

## 2012-08-01 ENCOUNTER — Ambulatory Visit
Admission: RE | Admit: 2012-08-01 | Discharge: 2012-08-01 | Disposition: A | Discharge status: Home / Self Care | Payer: Medicare Other | Source: Ambulatory Visit | Attending: Nurse Practitioner | Admitting: Nurse Practitioner

## 2012-08-01 DIAGNOSIS — R05 Cough: Secondary | ICD-10-CM

## 2012-08-01 DIAGNOSIS — N63 Unspecified lump in unspecified breast: Secondary | ICD-10-CM

## 2012-08-01 HISTORY — DX: Unspecified lump in unspecified breast: N63.0

## 2012-08-05 DIAGNOSIS — M1710 Unilateral primary osteoarthritis, unspecified knee: Secondary | ICD-10-CM | POA: Diagnosis not present

## 2012-08-05 DIAGNOSIS — M7650 Patellar tendinitis, unspecified knee: Secondary | ICD-10-CM | POA: Diagnosis not present

## 2012-08-26 DIAGNOSIS — R339 Retention of urine, unspecified: Secondary | ICD-10-CM | POA: Diagnosis not present

## 2012-08-26 DIAGNOSIS — D414 Neoplasm of uncertain behavior of bladder: Secondary | ICD-10-CM | POA: Diagnosis not present

## 2012-08-28 DIAGNOSIS — R413 Other amnesia: Secondary | ICD-10-CM | POA: Diagnosis not present

## 2012-08-28 DIAGNOSIS — R0789 Other chest pain: Secondary | ICD-10-CM

## 2012-08-28 HISTORY — DX: Other chest pain: R07.89

## 2012-08-29 ENCOUNTER — Ambulatory Visit
Admission: RE | Admit: 2012-08-29 | Discharge: 2012-08-29 | Disposition: A | Discharge status: Home / Self Care | Payer: Medicare Other | Source: Ambulatory Visit | Attending: Nurse Practitioner | Admitting: Nurse Practitioner

## 2012-08-29 ENCOUNTER — Other Ambulatory Visit: Payer: Self-pay | Admitting: Nurse Practitioner

## 2012-08-29 DIAGNOSIS — IMO0002 Reserved for concepts with insufficient information to code with codable children: Secondary | ICD-10-CM | POA: Diagnosis not present

## 2012-08-29 DIAGNOSIS — R52 Pain, unspecified: Secondary | ICD-10-CM

## 2012-08-29 DIAGNOSIS — M47814 Spondylosis without myelopathy or radiculopathy, thoracic region: Secondary | ICD-10-CM | POA: Diagnosis not present

## 2012-08-29 DIAGNOSIS — R072 Precordial pain: Secondary | ICD-10-CM | POA: Diagnosis not present

## 2012-08-29 DIAGNOSIS — R079 Chest pain, unspecified: Secondary | ICD-10-CM | POA: Diagnosis not present

## 2012-09-02 DIAGNOSIS — R413 Other amnesia: Secondary | ICD-10-CM | POA: Diagnosis not present

## 2012-09-02 DIAGNOSIS — R7989 Other specified abnormal findings of blood chemistry: Secondary | ICD-10-CM | POA: Diagnosis not present

## 2012-09-03 ENCOUNTER — Other Ambulatory Visit (HOSPITAL_COMMUNITY): Payer: Self-pay | Admitting: Specialist

## 2012-09-03 DIAGNOSIS — M25562 Pain in left knee: Secondary | ICD-10-CM

## 2012-09-04 DIAGNOSIS — Z23 Encounter for immunization: Secondary | ICD-10-CM | POA: Diagnosis not present

## 2012-09-10 ENCOUNTER — Encounter (HOSPITAL_COMMUNITY): Payer: Medicare Other

## 2012-09-16 DIAGNOSIS — D649 Anemia, unspecified: Secondary | ICD-10-CM | POA: Diagnosis not present

## 2012-09-18 ENCOUNTER — Encounter (HOSPITAL_COMMUNITY)
Admission: RE | Admit: 2012-09-18 | Discharge: 2012-09-18 | Disposition: A | Discharge status: Home / Self Care | Payer: Medicare Other | Source: Ambulatory Visit | Attending: Specialist | Admitting: Specialist

## 2012-09-18 ENCOUNTER — Other Ambulatory Visit: Payer: Self-pay | Admitting: Internal Medicine

## 2012-09-18 DIAGNOSIS — M25569 Pain in unspecified knee: Secondary | ICD-10-CM | POA: Diagnosis not present

## 2012-09-18 DIAGNOSIS — R350 Frequency of micturition: Secondary | ICD-10-CM | POA: Diagnosis not present

## 2012-09-18 DIAGNOSIS — D649 Anemia, unspecified: Secondary | ICD-10-CM | POA: Diagnosis not present

## 2012-09-18 DIAGNOSIS — Z96659 Presence of unspecified artificial knee joint: Secondary | ICD-10-CM | POA: Diagnosis not present

## 2012-09-18 DIAGNOSIS — M25562 Pain in left knee: Secondary | ICD-10-CM

## 2012-09-18 DIAGNOSIS — N63 Unspecified lump in unspecified breast: Secondary | ICD-10-CM | POA: Diagnosis not present

## 2012-09-18 DIAGNOSIS — N23 Unspecified renal colic: Secondary | ICD-10-CM

## 2012-09-18 DIAGNOSIS — R413 Other amnesia: Secondary | ICD-10-CM | POA: Diagnosis not present

## 2012-09-18 HISTORY — DX: Frequency of micturition: R35.0

## 2012-09-18 MED ORDER — TECHNETIUM TC 99M MEDRONATE IV KIT
18.5000 | PACK | Freq: Once | INTRAVENOUS | Status: AC | PRN
  Administered 2012-09-18: 18.5 via INTRAVENOUS

## 2012-09-19 DIAGNOSIS — M948X9 Other specified disorders of cartilage, unspecified sites: Secondary | ICD-10-CM

## 2012-09-19 HISTORY — DX: Other specified disorders of cartilage, unspecified sites: M94.8X9

## 2012-09-23 ENCOUNTER — Ambulatory Visit
Admission: RE | Admit: 2012-09-23 | Discharge: 2012-09-23 | Disposition: A | Discharge status: Home / Self Care | Payer: Medicare Other | Source: Ambulatory Visit | Attending: Internal Medicine | Admitting: Internal Medicine

## 2012-09-23 DIAGNOSIS — N23 Unspecified renal colic: Secondary | ICD-10-CM

## 2012-09-23 DIAGNOSIS — N281 Cyst of kidney, acquired: Secondary | ICD-10-CM | POA: Diagnosis not present

## 2012-09-25 DIAGNOSIS — M545 Low back pain: Secondary | ICD-10-CM | POA: Diagnosis not present

## 2012-09-25 DIAGNOSIS — R350 Frequency of micturition: Secondary | ICD-10-CM | POA: Diagnosis not present

## 2012-09-25 DIAGNOSIS — N390 Urinary tract infection, site not specified: Secondary | ICD-10-CM | POA: Diagnosis not present

## 2012-09-25 DIAGNOSIS — R32 Unspecified urinary incontinence: Secondary | ICD-10-CM | POA: Diagnosis not present

## 2012-11-25 DIAGNOSIS — M25569 Pain in unspecified knee: Secondary | ICD-10-CM | POA: Diagnosis not present

## 2012-11-27 ENCOUNTER — Other Ambulatory Visit (HOSPITAL_COMMUNITY): Payer: Self-pay | Admitting: Sports Medicine

## 2012-11-27 DIAGNOSIS — M25562 Pain in left knee: Secondary | ICD-10-CM

## 2012-12-04 ENCOUNTER — Encounter: Payer: Self-pay | Admitting: Nurse Practitioner

## 2012-12-04 ENCOUNTER — Non-Acute Institutional Stay (INDEPENDENT_AMBULATORY_CARE_PROVIDER_SITE_OTHER): Payer: Medicare Other | Admitting: Nurse Practitioner

## 2012-12-04 VITALS — BP 110/62 | HR 62 | Wt 153.0 lb

## 2012-12-04 DIAGNOSIS — M25569 Pain in unspecified knee: Secondary | ICD-10-CM

## 2012-12-04 DIAGNOSIS — R32 Unspecified urinary incontinence: Secondary | ICD-10-CM

## 2012-12-04 DIAGNOSIS — M25562 Pain in left knee: Secondary | ICD-10-CM | POA: Insufficient documentation

## 2012-12-04 DIAGNOSIS — G4700 Insomnia, unspecified: Secondary | ICD-10-CM

## 2012-12-04 DIAGNOSIS — M25561 Pain in right knee: Secondary | ICD-10-CM

## 2012-12-04 NOTE — Assessment & Plan Note (Signed)
No change 

## 2012-12-04 NOTE — Assessment & Plan Note (Signed)
Sleeps better with pain better controlled.

## 2012-12-04 NOTE — Assessment & Plan Note (Signed)
S/p TKR--mild warmth and pain palpated, f/u Ortho next week and will have NM LLE bone scan, continue Hydrocodone/Acetaminophen 7.5/325 bid, I q4hr prn, II q4hrs prn and Skelaxin nightly for now.

## 2012-12-04 NOTE — Progress Notes (Signed)
Patient ID: Janet Hernandez, female   DOB: 1930/06/30, 79 y.o.   MRN: [id number]  Chief Complaint: Chief Complaint  Patient presents with  . Medical Managment of Chronic Issues    anemia, Xiphoiditis     HPI:   ANEMIA  resolved with Hgb 12.5, post op--off Iron  MASS, BREAST  patient points to the xiphisternal junction as the "mass"she was worried about. Remains tender to palpation. No worse.  OSTEOARTHROSIS, KNEE  Worse. Had arthroscopy of both knees: right in 09/2008 and left in 01/2009. Saw Dr. Pat Patrick in past. Had replacement of the left knee in April 2013. Still having pain. Had bone scan at [facility] 09/18/12. Sowed increased uptake and possible synovitis and joint component loosening. She is to see Dr. Shelle Iron in follow up next week to have a repeated bone scan. Takes Hydrocodone/Acetaminophen 7.5/325 bid, I q4hr prn, II q4hr prn, and Skelaxin nightly  COUGH The patient's chronic cough has improved.  OTHER CHEST PAIN  found xiphoid process and bilateral lower rib cage tenderness upon palpation. most tender at the flank area, bilaterally-better  URINARY FREQUENCY     To be seen and evaluated by urologist, Dr. Natalia Leatherwood.   Review of Systems:  Review of Systems  Constitutional: Negative for fever, chills, weight loss, malaise/fatigue and diaphoresis.  HENT: Positive for hearing loss (mild). Negative for ear pain, congestion, sore throat, neck pain and ear discharge.   Eyes: Negative for pain, discharge and redness.  Respiratory: Negative for cough, sputum production, shortness of breath and wheezing.   Cardiovascular: Negative for palpitations, orthopnea, claudication, leg swelling and PND.  Gastrointestinal: Negative for heartburn, nausea, vomiting, abdominal pain, diarrhea, constipation and blood in stool.  Genitourinary: Positive for frequency. Negative for dysuria, urgency and flank pain.  Musculoskeletal: Positive for joint pain (right knee). Negative for myalgias, back pain and  falls.  Skin: Negative for itching and rash.  Neurological: Negative for dizziness, tingling, tremors, sensory change, focal weakness, seizures, loss of consciousness, weakness and headaches.  Endo/Heme/Allergies: Negative for environmental allergies and polydipsia. Does not bruise/bleed easily.  Psychiatric/Behavioral: Positive for memory loss. Negative for depression and hallucinations. The patient is nervous/anxious. The patient does not have insomnia.      Medications: Patient's Medications  New Prescriptions   No medications on file  Previous Medications   ASPIRIN 325 MG BUFFERED TABLET    Take 325 mg by mouth daily.   BISACODYL (DULCOLAX) 5 MG EC TABLET    Take 5 mg by mouth. Take one daily for constipation   GLUCOSAMINE HCL 1500 MG TABS    Take 1,500 mg by mouth daily.   HYDROCODONE-ACETAMINOPHEN (NORCO) 7.5-325 MG PER TABLET    1 tablet. Take one twice daily; one every 4 hours as needed for pain; take 2 tablets every 4 hours as needed for severe pain   MECLIZINE (ANTIVERT) 25 MG TABLET    Take 25 mg by mouth 3 (three) times daily as needed. Vertigo    METAXALONE (SKELAXIN) 800 MG TABLET    Take 800 mg by mouth daily.   OXYBUTYNIN CHLORIDE 10 % GEL    Place 10 application onto the skin. Apply to arm/abd or thigh daily   VOLTAREN 1 % GEL    1 application. Apply to knee daily  Modified Medications   No medications on file  Discontinued Medications   FERROUS GLUCONATE (FERGON) 325 MG TABLET    Take 325 mg by mouth 2 (two) times daily.   NITROGLYCERIN (NITRODUR -  DOSED IN MG/24 HR) 0.1 MG/HR       RIVAROXABAN (XARELTO) 10 MG TABS TABLET    Take 1 tablet (10 mg total) by mouth daily with breakfast.   TURMERIC CURCUMIN PO    Take 400 mg by mouth daily.     Physical Exam: Physical Exam  Constitutional: She is oriented to person, place, and time. She appears well-developed and well-nourished. No distress.  HENT:  Head: Normocephalic and atraumatic.  Eyes: EOM are normal. Pupils  are equal, round, and reactive to light.  Neck: Normal range of motion. Neck supple. No JVD present. No thyromegaly present.  Cardiovascular: Normal rate, regular rhythm and normal heart sounds.   No murmur heard. Pulmonary/Chest: Effort normal and breath sounds normal. She has no wheezes. She has no rales. She exhibits no tenderness.  Abdominal: Soft. Bowel sounds are normal. She exhibits no distension. There is no tenderness.  Musculoskeletal: Normal range of motion. She exhibits tenderness (the right knee). She exhibits no edema.  Lymphadenopathy:    She has cervical adenopathy.  Neurological: She is alert and oriented to person, place, and time. She has normal reflexes. She displays normal reflexes. No cranial nerve deficit. She exhibits normal muscle tone. Coordination normal.  Skin: Skin is warm and dry. No rash noted. She is not diaphoretic. There is erythema (mild on the right knee).  Psychiatric: Her mood appears anxious. Her affect is blunt. Her affect is not angry, not labile and not inappropriate. Her speech is not delayed, not tangential and not slurred. She is not agitated, not aggressive, not hyperactive, not slowed, not withdrawn and not combative. Thought content is not paranoid and not delusional. She does not express impulsivity or inappropriate judgment. She exhibits a depressed mood. She exhibits normal remote memory. Abnormal recent memory: mild.     Filed Vitals:   12/04/12 1352  BP: 110/62  Pulse: 62  Weight: 153 lb (69.4 kg)      Labs reviewed: Basic Metabolic Panel:  Recent Labs  96/04/54 0355  NA 135  K 3.6  CL 102  CO2 25  GLUCOSE 143*  BUN 14  CREATININE 0.71  CALCIUM 8.8    Liver Function Tests: No results found for this basename: AST, ALT, ALKPHOS, BILITOT, PROT, ALBUMIN,  in the last 8760 hours  CBC:  Recent Labs  12/14/11 0355 12/15/11 0405 12/16/11 0419  WBC 9.7 10.5 9.0  HGB 11.6* 11.5* 11.0*  HCT 37.0 36.1 35.1*  MCV 81.9 81.3  82.8  PLT 221 206 187    Significant Diagnostic Results: 08/29/12 T spine X-ray  IMPRESSION: No acute osseous abnormality.  Mild upper thoracic spondylosis. Degenerative disc disease and spondylosis at C5-6.  08/29/12 X-ray Sternum  IMPRESSION: Normal examination  09/18/12 X-ray bilateral ribs.   IMPRESSION: Normal ribs.  Deformity involving the left humeral head, possibly due to chronic impaction against the glenoid.  09/18/12 NM bone scan RLE  IMPRESSION: Asymmetric increased activity around the tibial component is suggestive of loosening of that component. The synovial enhancement is consistent with synovitis which could be sterile and is often seen this soon after surgery.   09/23/12 US renal   IMPRESSION:   Normal renal ultrasound. Small benign appearing bilateral renal cyst.   Assessment/Plan Knee pain, left anterior S/p TKR--mild warmth and pain palpated, f/u Ortho next week and will have NM LLE bone scan, continue Hydrocodone/Acetaminophen 7.5/325 bid, I q4hr prn, II q4hrs prn and Skelaxin nightly for now.   Unspecified urinary incontinence No change.  Insomnia Sleeps better with pain better controlled.       Family/ staff Communication: the patient will administer her medication independently(was administer at [facility] since the patient was [place] [facility] [place])   Goals of care: managing pain and maintaining presently physical funciton.    Labs/tests ordered none.

## 2012-12-08 ENCOUNTER — Encounter (HOSPITAL_COMMUNITY)
Admission: RE | Admit: 2012-12-08 | Discharge: 2012-12-08 | Disposition: A | Discharge status: Home / Self Care | Payer: Medicare Other | Source: Ambulatory Visit | Attending: Sports Medicine | Admitting: Sports Medicine

## 2012-12-08 DIAGNOSIS — M25569 Pain in unspecified knee: Secondary | ICD-10-CM | POA: Diagnosis not present

## 2012-12-08 DIAGNOSIS — M25562 Pain in left knee: Secondary | ICD-10-CM

## 2012-12-08 MED ORDER — TECHNETIUM TC 99M MEDRONATE IV KIT
25.0000 | PACK | Freq: Once | INTRAVENOUS | Status: AC | PRN
  Administered 2012-12-08: 25 via INTRAVENOUS

## 2012-12-16 DIAGNOSIS — M1710 Unilateral primary osteoarthritis, unspecified knee: Secondary | ICD-10-CM | POA: Diagnosis not present

## 2012-12-17 ENCOUNTER — Other Ambulatory Visit (HOSPITAL_COMMUNITY): Payer: Medicare Other

## 2012-12-17 ENCOUNTER — Encounter (HOSPITAL_COMMUNITY): Payer: Medicare Other

## 2013-01-09 DIAGNOSIS — N3941 Urge incontinence: Secondary | ICD-10-CM | POA: Diagnosis not present

## 2013-01-29 ENCOUNTER — Encounter: Payer: Self-pay | Admitting: Nurse Practitioner

## 2013-03-05 ENCOUNTER — Non-Acute Institutional Stay: Payer: Medicare Other | Admitting: Internal Medicine

## 2013-03-05 VITALS — BP 122/70 | HR 72 | Wt 152.0 lb

## 2013-03-05 DIAGNOSIS — M25562 Pain in left knee: Secondary | ICD-10-CM

## 2013-03-05 DIAGNOSIS — M25569 Pain in unspecified knee: Secondary | ICD-10-CM | POA: Diagnosis not present

## 2013-03-05 NOTE — Progress Notes (Signed)
  Subjective:    Patient ID: Janet Hernandez, female    DOB: 01-Jan-1930, 79 y.o.   MRN: [id number]  HPI  Having an increase in the pain in her left knee. Present for months, but increased in the last 2 weeks. Has seen Dr. Shelle Iron in the past. Had lefrt TKR April 2013. Has used diclofenac gel once a day.   Review of Systems  Constitutional: Negative.  Negative for fever.  Eyes: Negative.   Respiratory: Negative.   Cardiovascular: Negative.   Musculoskeletal: Positive for joint swelling and arthralgias.       Pain and swelling at the left knee. Not inflamed or hot.  Skin: Negative.        Objective:   Physical Exam  Constitutional: She is oriented to person, place, and time. She appears well-developed and well-nourished. She appears distressed.  Neck: No JVD present. No tracheal deviation present. No thyromegaly present.  Cardiovascular: Normal rate, regular rhythm and normal heart sounds.  Exam reveals no gallop and no friction rub.   No murmur heard. Pulmonary/Chest: Breath sounds normal. No respiratory distress.  Abdominal: Soft. Bowel sounds are normal.  Musculoskeletal: She exhibits tenderness. She exhibits no edema.  Pain and swelling at the left knee. Old surgical scar at the left knee.  Lymphadenopathy:    She has no cervical adenopathy.  Neurological: She is alert and oriented to person, place, and time. No cranial nerve deficit.  Skin: No rash noted. No erythema. No pallor.  Psychiatric: She has a normal mood and affect. Her behavior is normal. Judgment and thought content normal.          Assessment & Plan:  Knee pain, left anterior: try medicating adequately. If not better in 2 weeks, will need x-rays and orthopedic appt.  - Plan: diclofenac sodium (VOLTAREN) 1 % GEL, HYDROcodone-acetaminophen (NORCO) 7.5-325 MG per tablet, naproxen sodium (ALEVE) 220 MG tablet

## 2013-03-07 MED ORDER — DICLOFENAC SODIUM 1 % TD GEL: TRANSDERMAL | Status: DC

## 2013-03-07 MED ORDER — HYDROCODONE-ACETAMINOPHEN 7.5-325 MG PO TABS: ORAL_TABLET | ORAL | Status: DC

## 2013-03-07 MED ORDER — NAPROXEN SODIUM 220 MG PO TABS: 220.0000 mg | ORAL_TABLET | Freq: Two times a day (BID) | ORAL | Status: DC

## 2013-03-07 NOTE — Patient Instructions (Signed)
Use medication as directed. 

## 2013-03-10 ENCOUNTER — Encounter (HOSPITAL_COMMUNITY)
Admission: EM | Disposition: A | Discharge status: Home / Self Care | Payer: Self-pay | Source: Home / Self Care | Attending: Internal Medicine

## 2013-03-10 ENCOUNTER — Inpatient Hospital Stay (HOSPITAL_COMMUNITY)
Admission: EM | Admit: 2013-03-10 | Discharge: 2013-03-11 | DRG: 384 | Disposition: A | Discharge status: Home / Self Care | Payer: Medicare Other | Attending: Internal Medicine | Admitting: Internal Medicine

## 2013-03-10 ENCOUNTER — Emergency Department (HOSPITAL_COMMUNITY): Payer: Medicare Other

## 2013-03-10 ENCOUNTER — Encounter (HOSPITAL_COMMUNITY): Payer: Self-pay

## 2013-03-10 DIAGNOSIS — G729 Myopathy, unspecified: Secondary | ICD-10-CM | POA: Diagnosis present

## 2013-03-10 DIAGNOSIS — Z96659 Presence of unspecified artificial knee joint: Secondary | ICD-10-CM

## 2013-03-10 DIAGNOSIS — Z79899 Other long term (current) drug therapy: Secondary | ICD-10-CM

## 2013-03-10 DIAGNOSIS — R1013 Epigastric pain: Secondary | ICD-10-CM | POA: Diagnosis not present

## 2013-03-10 DIAGNOSIS — K259 Gastric ulcer, unspecified as acute or chronic, without hemorrhage or perforation: Secondary | ICD-10-CM | POA: Diagnosis present

## 2013-03-10 DIAGNOSIS — R109 Unspecified abdominal pain: Secondary | ICD-10-CM | POA: Diagnosis not present

## 2013-03-10 DIAGNOSIS — K2980 Duodenitis without bleeding: Secondary | ICD-10-CM | POA: Diagnosis not present

## 2013-03-10 DIAGNOSIS — R1033 Periumbilical pain: Secondary | ICD-10-CM | POA: Diagnosis not present

## 2013-03-10 DIAGNOSIS — M948X9 Other specified disorders of cartilage, unspecified sites: Secondary | ICD-10-CM | POA: Diagnosis present

## 2013-03-10 DIAGNOSIS — K2960 Other gastritis without bleeding: Secondary | ICD-10-CM | POA: Diagnosis present

## 2013-03-10 DIAGNOSIS — R141 Gas pain: Secondary | ICD-10-CM | POA: Diagnosis not present

## 2013-03-10 DIAGNOSIS — K449 Diaphragmatic hernia without obstruction or gangrene: Secondary | ICD-10-CM | POA: Diagnosis present

## 2013-03-10 DIAGNOSIS — N1330 Unspecified hydronephrosis: Secondary | ICD-10-CM | POA: Diagnosis not present

## 2013-03-10 DIAGNOSIS — K263 Acute duodenal ulcer without hemorrhage or perforation: Principal | ICD-10-CM | POA: Diagnosis present

## 2013-03-10 DIAGNOSIS — G4700 Insomnia, unspecified: Secondary | ICD-10-CM | POA: Diagnosis present

## 2013-03-10 DIAGNOSIS — R143 Flatulence: Secondary | ICD-10-CM | POA: Diagnosis not present

## 2013-03-10 DIAGNOSIS — K253 Acute gastric ulcer without hemorrhage or perforation: Secondary | ICD-10-CM | POA: Diagnosis not present

## 2013-03-10 DIAGNOSIS — K269 Duodenal ulcer, unspecified as acute or chronic, without hemorrhage or perforation: Secondary | ICD-10-CM | POA: Diagnosis not present

## 2013-03-10 DIAGNOSIS — K2970 Gastritis, unspecified, without bleeding: Secondary | ICD-10-CM | POA: Diagnosis not present

## 2013-03-10 DIAGNOSIS — I4510 Unspecified right bundle-branch block: Secondary | ICD-10-CM | POA: Diagnosis present

## 2013-03-10 DIAGNOSIS — M25562 Pain in left knee: Secondary | ICD-10-CM | POA: Diagnosis present

## 2013-03-10 HISTORY — PX: ESOPHAGOGASTRODUODENOSCOPY: SHX5428

## 2013-03-10 LAB — URINALYSIS, ROUTINE W REFLEX MICROSCOPIC
Bilirubin Urine: NEGATIVE
Ketones, ur: NEGATIVE mg/dL
Nitrite: NEGATIVE
Urobilinogen, UA: 0.2 mg/dL (ref 0.0–1.0)

## 2013-03-10 LAB — POCT I-STAT, CHEM 8
BUN: 23 mg/dL (ref 6–23)
Chloride: 106 mEq/L (ref 96–112)
Creatinine, Ser: 0.7 mg/dL (ref 0.50–1.10)
Glucose, Bld: 119 mg/dL — ABNORMAL HIGH (ref 70–99)
Potassium: 4.1 mEq/L (ref 3.5–5.1)

## 2013-03-10 LAB — CBC WITH DIFFERENTIAL/PLATELET
Basophils Relative: 0 % (ref 0–1)
Eosinophils Absolute: 0.2 10*3/uL (ref 0.0–0.7)
Eosinophils Relative: 2 % (ref 0–5)
HCT: 39 % (ref 36.0–46.0)
Hemoglobin: 12.6 g/dL (ref 12.0–15.0)
MCH: 25 pg — ABNORMAL LOW (ref 26.0–34.0)
MCHC: 32.3 g/dL (ref 30.0–36.0)
MCV: 77.4 fL — ABNORMAL LOW (ref 78.0–100.0)
Monocytes Absolute: 0.6 10*3/uL (ref 0.1–1.0)
Monocytes Relative: 7 % (ref 3–12)

## 2013-03-10 LAB — COMPREHENSIVE METABOLIC PANEL
Albumin: 3.4 g/dL — ABNORMAL LOW (ref 3.5–5.2)
BUN: 22 mg/dL (ref 6–23)
Chloride: 104 mEq/L (ref 96–112)
Creatinine, Ser: 0.64 mg/dL (ref 0.50–1.10)
Total Bilirubin: 0.2 mg/dL — ABNORMAL LOW (ref 0.3–1.2)

## 2013-03-10 SURGERY — EGD (ESOPHAGOGASTRODUODENOSCOPY)
Anesthesia: Moderate Sedation | Laterality: Left

## 2013-03-10 MED ORDER — DOCUSATE SODIUM 100 MG PO CAPS
100.0000 mg | ORAL_CAPSULE | Freq: Two times a day (BID) | ORAL | Status: DC
  Administered 2013-03-10 – 2013-03-11 (×2): 100 mg via ORAL
  Filled 2013-03-10 (×6): qty 1

## 2013-03-10 MED ORDER — ACETAMINOPHEN 650 MG RE SUPP: 650.0000 mg | Freq: Four times a day (QID) | RECTAL | Status: DC | PRN

## 2013-03-10 MED ORDER — BISACODYL 5 MG PO TBEC: 5.0000 mg | DELAYED_RELEASE_TABLET | Freq: Every day | ORAL | Status: DC | PRN

## 2013-03-10 MED ORDER — ONDANSETRON HCL 4 MG PO TABS: 4.0000 mg | ORAL_TABLET | Freq: Four times a day (QID) | ORAL | Status: DC | PRN

## 2013-03-10 MED ORDER — IOHEXOL 300 MG/ML  SOLN
100.0000 mL | Freq: Once | INTRAMUSCULAR | Status: AC | PRN
  Administered 2013-03-10: 100 mL via INTRAVENOUS

## 2013-03-10 MED ORDER — SUCRALFATE 1 G PO TABS
1.0000 g | ORAL_TABLET | Freq: Three times a day (TID) | ORAL | Status: DC
  Administered 2013-03-10 – 2013-03-11 (×4): 1 g via ORAL
  Filled 2013-03-10 (×8): qty 1

## 2013-03-10 MED ORDER — MECLIZINE HCL 25 MG PO TABS
25.0000 mg | ORAL_TABLET | Freq: Three times a day (TID) | ORAL | Status: DC | PRN
  Filled 2013-03-10: qty 1

## 2013-03-10 MED ORDER — ONDANSETRON HCL 4 MG/2ML IJ SOLN
4.0000 mg | Freq: Once | INTRAMUSCULAR | Status: AC
  Administered 2013-03-10: 4 mg via INTRAVENOUS
  Filled 2013-03-10 (×2): qty 2

## 2013-03-10 MED ORDER — SODIUM CHLORIDE 0.9 % IV SOLN: INTRAVENOUS | Status: DC

## 2013-03-10 MED ORDER — MIDAZOLAM HCL 10 MG/2ML IJ SOLN
INTRAMUSCULAR | Status: AC
  Filled 2013-03-10: qty 2

## 2013-03-10 MED ORDER — IOHEXOL 300 MG/ML  SOLN
50.0000 mL | Freq: Once | INTRAMUSCULAR | Status: AC | PRN
  Administered 2013-03-10: 50 mL via ORAL

## 2013-03-10 MED ORDER — SODIUM CHLORIDE 0.9 % IV SOLN
8.0000 mg/h | INTRAVENOUS | Status: DC
  Administered 2013-03-10 (×2): 8 mg/h via INTRAVENOUS
  Filled 2013-03-10 (×6): qty 80

## 2013-03-10 MED ORDER — PANTOPRAZOLE SODIUM 40 MG IV SOLR
40.0000 mg | Freq: Two times a day (BID) | INTRAVENOUS | Status: DC
  Filled 2013-03-10: qty 40

## 2013-03-10 MED ORDER — ACETAMINOPHEN 325 MG PO TABS: 650.0000 mg | ORAL_TABLET | Freq: Four times a day (QID) | ORAL | Status: DC | PRN

## 2013-03-10 MED ORDER — PANTOPRAZOLE SODIUM 40 MG IV SOLR
40.0000 mg | Freq: Once | INTRAVENOUS | Status: AC
  Administered 2013-03-10: 40 mg via INTRAVENOUS
  Filled 2013-03-10: qty 40

## 2013-03-10 MED ORDER — HYDROCODONE-ACETAMINOPHEN 5-325 MG PO TABS: 1.0000 | ORAL_TABLET | ORAL | Status: DC | PRN

## 2013-03-10 MED ORDER — FENTANYL CITRATE 0.05 MG/ML IJ SOLN
INTRAMUSCULAR | Status: AC
  Filled 2013-03-10: qty 2

## 2013-03-10 MED ORDER — SODIUM CHLORIDE 0.9 % IV SOLN
80.0000 mg | Freq: Once | INTRAVENOUS | Status: AC
  Administered 2013-03-10: 40 mg via INTRAVENOUS
  Filled 2013-03-10: qty 80

## 2013-03-10 MED ORDER — SODIUM CHLORIDE 0.9 % IV SOLN
INTRAVENOUS | Status: DC
  Administered 2013-03-10: 04:00:00 via INTRAVENOUS

## 2013-03-10 MED ORDER — MORPHINE SULFATE 2 MG/ML IJ SOLN: 1.0000 mg | INTRAMUSCULAR | Status: DC | PRN

## 2013-03-10 MED ORDER — SODIUM CHLORIDE 0.9 % IV SOLN
INTRAVENOUS | Status: DC
  Administered 2013-03-10: 10:00:00 via INTRAVENOUS

## 2013-03-10 MED ORDER — FENTANYL CITRATE 0.05 MG/ML IJ SOLN
50.0000 ug | INTRAMUSCULAR | Status: DC | PRN
  Administered 2013-03-10: 50 ug via INTRAVENOUS
  Filled 2013-03-10: qty 2

## 2013-03-10 MED ORDER — MIDAZOLAM HCL 10 MG/2ML IJ SOLN
INTRAMUSCULAR | Status: DC | PRN
  Administered 2013-03-10: 1 mg via INTRAVENOUS
  Administered 2013-03-10: 2 mg via INTRAVENOUS
  Administered 2013-03-10: 1 mg via INTRAVENOUS

## 2013-03-10 MED ORDER — FENTANYL CITRATE 0.05 MG/ML IJ SOLN
INTRAMUSCULAR | Status: DC | PRN
  Administered 2013-03-10 (×2): 25 ug via INTRAVENOUS

## 2013-03-10 MED ORDER — ONDANSETRON HCL 4 MG/2ML IJ SOLN: 4.0000 mg | Freq: Four times a day (QID) | INTRAMUSCULAR | Status: DC | PRN

## 2013-03-10 NOTE — H&P (Signed)
[facility] History and Physical  Janet Hernandez:[id number] DOB: [birth year] DOA: 03/10/2013  Referring physician: Dr Theodoro Kalata.  PCP: Kimber Relic, MD  Specialists: Dr Elnoria Howard.   Chief Complaint: Worsening abdominal pain.   HPI: Janet Hernandez is a 79 y.o. female with past medical history significant for arthritis, vertigo who presents to the emergency department complaining of worsening abdominal pain overnight. Patient relate abdominal pain for a week. Pain is in the epigastric area, sharp in quality. No alleviating or exacerbating factors. She denies vomiting, melena hematochezia, hematemesis. She does take 2 Aleve every day for arthritis. She denies significant weight loss.   Patient had a CT scan that showed apparent focal erosion involving the anterior wall of the distal gastric antrum which may reflect a focal gastric ulceration. No free air  seen to suggest significant perforation. A large amount of associated soft tissue inflammation is noted about the antrum of the stomach and pylorus, extending to the  adjacent gallbladder and pancreatic head   Review of Systems: Negative except as per HPI.   Past Medical History  Diagnosis Date  . Arthritis   . Rash     inner legs- ? med allergy- to see PCP if doesnt improve by end of week  . Overactive bladder   . Myopathy, unspecified   . Benign paroxysmal positional vertigo   . Right bundle branch block   . Unspecified tinnitus   . Osteoarthrosis, unspecified whether generalized or localized, unspecified site   . Osteoarthrosis, unspecified whether generalized or localized, lower leg   . Spinal stenosis, lumbar region, without neurogenic claudication   . Lumbago   . Unspecified urinary incontinence   . Urgency of urination   . Other abnormal blood chemistry   . Knee pain, left anterior   . Other disorders of bone and cartilage(733.99) 09/19/2012  . Urinary frequency 09/18/2012  . Other chest pain 08/28/2012  . Lump or mass  in breast 08/01/2012  . Memory loss 01/22/2012  . Candidiasis 01/01/2012  . Unspecified constipation 12/18/2011  . Seborrheic keratoses 05/17/2011  . Myalgia and myositis, unspecified 05/17/2011  . Spasm of muscle 01/11/2011  . Cramp of limb 01/11/2011  . Disorder of bone and cartilage, unspecified 11/10/2010  . Dizziness and giddiness 11/10/2010  . Anemia, unspecified 09/07/2010  . Insomnia, unspecified 09/07/2010  . Other abnormal blood chemistry 03/23/2010  . Osteoarthrosis, unspecified whether generalized or localized, lower leg 11/17/2009  . Allergic rhinitis due to pollen 07/01/2009  . Lumbago 07/01/2009   Past Surgical History  Procedure Laterality Date  . Back surgery      lumbar-- ? type  Dr Shelle Iron  . Eye surgery      bilateral cataract extraction with IOL  . Abdominal hysterectomy    . Cleft lip repair      with repair palate  . Breast surgery      right lumpectomy  . Total knee arthroplasty  12/13/2011    Procedure: TOTAL KNEE ARTHROPLASTY;  Surgeon: Javier Docker, MD;  Location: [facility];  Service: Orthopedics;  Laterality: Left;  . Breast lumpectomy  1993    breast cyst  . Appendectomy  1956   Social History:  reports that she has never smoked. She has never used smokeless tobacco. She reports that she does not drink alcohol or use illicit drugs.[facility] She has 3 children, one of then lives here in [place].    No Known Allergies  Family History: No history  of PUD or gastric cancer.   Prior to Admission medications   Medication Sig Start Date End Date Taking? Authorizing Provider  aspirin 325 MG buffered tablet Take 325 mg by mouth daily.    Historical Provider, MD  bisacodyl (DULCOLAX) 5 MG EC tablet Take 5 mg by mouth. Take one daily for constipation    Historical Provider, MD  diclofenac sodium (VOLTAREN) 1 % GEL Apply to painful knee 3 times daily 03/07/13   Kimber Relic, MD  Glucosamine HCl 1500 MG TABS Take 1,500 mg by mouth  daily.    Historical Provider, MD  HYDROcodone-acetaminophen (NORCO) 7.5-325 MG per tablet One every 4 hours as needed for pain; take 2 tablets every 4 hours as needed for severe pain 03/07/13   Kimber Relic, MD  meclizine (ANTIVERT) 25 MG tablet Take 25 mg by mouth 3 (three) times daily as needed. Vertigo     Historical Provider, MD  metaxalone (SKELAXIN) 800 MG tablet Take 800 mg by mouth daily.    Historical Provider, MD  naproxen sodium (ALEVE) 220 MG tablet Take 1 tablet (220 mg total) by mouth 2 (two) times daily with a meal. 03/07/13   Kimber Relic, MD  Oxybutynin Chloride (GELNIQUE) 10 % GEL Place onto the skin. Apple one pack to thigh daily    Historical Provider, MD   Physical Exam: Filed Vitals:   03/10/13 0324 03/10/13 0528  BP: 162/74   Pulse: 71 74  Temp: 97.8 F (36.6 C)   TempSrc: Oral   Resp: 18 14  SpO2: 96% 96%   General Appearance:    Alert, cooperative, no distress, appears stated age  Head:    Normocephalic, without obvious abnormality, atraumatic  Eyes:    PERRL, conjunctiva/corneas clear, EOM's intact,     Ears:    Normal TM's and external ear canals, both ears  Nose:   Nares normal, septum midline, mucosa normal, no drainage    or sinus tenderness  Throat:   Lips, mucosa, and tongue normal; teeth and gums normal  Neck:   Supple, symmetrical, trachea midline, no adenopathy;    thyroid:  no enlargement/tenderness/nodules; no carotid   bruit or JVD  Back:     Symmetric, no curvature, ROM normal, no CVA tenderness  Lungs:     Clear to auscultation bilaterally, respirations unlabored  Chest Wall:    No tenderness or deformity   Heart:    Regular rate and rhythm, S1 and S2 normal, no murmur, rub   or gallop     Abdomen:     Soft, epigastric tenderness, bowel sounds active all four quadrants, no masses, no organomegaly, no rigidity, no gaurding.         Extremities:   Extremities normal, atraumatic, no cyanosis or edema  Pulses:   2+ and symmetric all  extremities  Skin:   Skin color, texture, turgor normal, no rashes or lesions  Lymph nodes:   Cervical, supraclavicular, and axillary nodes normal  Neurologic:   CNII-XII intact, normal strength, sensation and reflexes    throughout      Labs on Admission:  Basic Metabolic Panel:  Recent Labs Lab 03/10/13 0405 03/10/13 0413  NA 138 140  K 4.2 4.1  CL 104 106  CO2 23  --   GLUCOSE 122* 119*  BUN 22 23  CREATININE 0.64 0.70  CALCIUM 9.5  --    Liver Function Tests:  Recent Labs Lab 03/10/13 0405  AST 27  ALT 23  ALKPHOS  140*  BILITOT 0.2*  PROT 7.2  ALBUMIN 3.4*    Recent Labs Lab 03/10/13 0405  LIPASE 22   No results found for this basename: AMMONIA,  in the last 168 hours CBC:  Recent Labs Lab 03/10/13 0405 03/10/13 0413  WBC 8.9  --   NEUTROABS 6.9  --   HGB 12.6 13.6  HCT 39.0 40.0  MCV 77.4*  --   PLT 275  --    Cardiac Enzymes: No results found for this basename: CKTOTAL, CKMB, CKMBINDEX, TROPONINI,  in the last 168 hours  BNP (last 3 results) No results found for this basename: PROBNP,  in the last 8760 hours CBG: No results found for this basename: GLUCAP,  in the last 168 hours  Radiological Exams on Admission: Ct Abdomen Pelvis W Contrast  03/10/2013   *RADIOLOGY REPORT*  Clinical Data: Epigastric abdominal pain.  CT ABDOMEN AND PELVIS WITH CONTRAST  Technique:  Multidetector CT imaging of the abdomen and pelvis was performed following the standard protocol during bolus administration of intravenous contrast.  Contrast:  100 mL of Omnipaque 300 IV contrast  Comparison: Renal ultrasound performed 09/23/2012, and lumbar spine radiographs performed 04/23/2007  Findings: Mild bibasilar atelectasis or scarring is noted.  Significant soft tissue inflammation is noted about the antrum of the stomach and pylorus, with diffuse wall thickening.  Soft tissue inflammation extends to the adjacent gallbladder and pancreatic head, but the inflammation  appears centered about the gastric antrum.  There appears to be focal erosion involving the anterior wall of the distal gastric antrum, which may reflect a focal gastric ulceration.  No free air is seen to suggest significant perforation.  The liver and spleen are unremarkable in appearance.  The gallbladder is within normal limits.  The pancreas and adrenal glands are unremarkable.  There is apparent mild chronic right-sided hydronephrosis; mild left-sided pelvicaliectasis remains within normal limits.  No distal obstructing stone is identified; this may reflect an underlying stricture at the right ureteropelvic junction, though not definitely characterized on delayed images.  A 2.3 cm cyst is noted at the posterior aspect of the right kidney. There is a small 2 mm stone near the upper pole of the left kidney, with an adjacent small cyst.  No free fluid is identified.  The small bowel is unremarkable in appearance.  The stomach is within normal limits.  No acute vascular abnormalities are seen.  Mild scattered calcification is noted along the distal abdominal aorta and its branches.  The patient is status post appendectomy.  The colon is largely filled with stool, raising question for minimal constipation.  The bladder is mildly distended and grossly unremarkable in appearance.  The uterus is grossly unremarkable.  No suspicious adnexal masses are seen; the ovaries appear grossly symmetric.  No inguinal lymphadenopathy is seen.  No acute osseous abnormalities are identified.  There is relatively stable grade 2 anterolisthesis of L4 on L5, reflecting chronic bilateral pars defects at L4.  An associated set of pedicle screws is noted at L5.  IMPRESSION:  1.  Apparent focal erosion involving the anterior wall of the distal gastric antrum, which may reflect a focal gastric ulceration.  No free air seen to suggest significant perforation. A large amount of associated soft tissue inflammation is noted about the antrum of  the stomach and pylorus, extending to the adjacent gallbladder and pancreatic head. 2.  Apparent mild chronic right-sided hydronephrosis, new from prior renal ultrasound; no distal obstructing stone seen.  This may reflect  an underlying stricture at the right ureteropelvic junction, though not definitely characterized on delayed images. 3.  2 mm nonobstructing stone near the upper pole of the left kidney; small bilateral renal cysts seen. 4.  Mild scattered calcification along the distal abdominal aorta and its branches. 5.  Relatively stable grade 2 anterolisthesis of L4 on L5, reflecting chronic bilateral pars defects at L4.  Associated pedicle screws noted at L5. 6.  Mild bibasilar atelectasis or scarring noted.  These results were called by telephone on 03/10/2013 at 06:41 a.m. to Dr. Sunnie Nielsen, who verbally acknowledged these results.   Original Report Authenticated By: Tonia Ghent, M.D.   EKG: sinus rhythm, incomplete BBB.    Assessment/Plan Active Problems:   * No active hospital problems. *  1-Abdominal pain: Probably secondary to gastric ulcer. Ct scan with focal erosion involving the anterior wall of the  distal gastric antrum, No free air seen to suggest significant perforation. A large amount of associated soft tissue inflammation is noted  about the antrum of the stomach and pylorus, extending to the adjacent gallbladder and pancreatic head. Lipase normal. Will admit patient to regular floor. NPO. Protonix Gtt. Dr Elnoria Howard with GI consulted for endoscopy. Will discontinue aleve and aspirin.   2-Osteoarthritis: Continue with norco PRN. Will discontinue aleve due to # 1.  3-Vertigo: Continue with PRN Meclizine.   Code Status: Presume Full Code.  Family Communication: Care discussed with patient,  Disposition Plan: expect 3 days inpatient.   Time spent: 75 minutes.   Jane Birkel [facility] Pager [phone]  If 7PM-7AM, please contact  night-coverage [email or web address] Password [facility] 03/10/2013, 8:26 AM

## 2013-03-10 NOTE — ED Notes (Signed)
Pt arrives by EMS with c/os-upper epigastric abdominal and chest pain-states the pain started 3 days ago under her left breast and radiates down to LLQ area-denies N/V/D-denies SOB or diaphoresis

## 2013-03-10 NOTE — ED Notes (Signed)
Janet Hernandez contacted at [facility] and left a message as pt's request.

## 2013-03-10 NOTE — Progress Notes (Signed)
Patient signed up for my chart. Briscoe Burns BSN, RN-BC Admissions RN  03/10/2013 10:07 AM

## 2013-03-10 NOTE — ED Notes (Signed)
Pt care assumed, obtained verbal report.  Pt reports abd pain rates it at 5/10.  Pt is requesting to have [facility] residence [facility] to speak to a woman named Corey Harold who is a Engineer, civil (consulting) at the facility to contact her sister to notify her that she is being admitted in the hospital today.

## 2013-03-10 NOTE — Progress Notes (Signed)
Spoke to Dr. Sunnie Nielsen, informed that patient on floor and that patient had 40mg  protonix IV in ER at 0700, dose she want patient to have 80 mg dose now and protonix gtt, MD states to only give 40mg  of 80mg  dose and then start protonix drop

## 2013-03-10 NOTE — ED Notes (Signed)
WGN:[id number]> Expected date:<BR> Expected time:<BR> Means of arrival:<BR> Comments:<BR> EMS/79 yo female with abdominal and chest pain

## 2013-03-10 NOTE — ED Notes (Signed)
Assisted pt to the BR, pt with steady gait without difficulty.

## 2013-03-10 NOTE — Op Note (Signed)
[facility] [address] [place], [address]   OPERATIVE PROCEDURE REPORT  PATIENT: Janet Hernandez, Janet Hernandez  MR#: [id number] BIRTHDATE: 21-Apr-1930  GENDER: Female ENDOSCOPIST: [name], MD ASSISTANT:   Anthony Sar, RN and Oletha Blend, technician PROCEDURE DATE: 03/10/2013 PROCEDURE:   EGD w/ biopsy ASA CLASS:   Class III INDICATIONS:abnormal CT of the GI tract. MEDICATIONS: Versed 4 mg IV and Fentanyl 50 mcg IV TOPICAL ANESTHETIC:   none  DESCRIPTION OF PROCEDURE:   After the risks benefits and alternatives of the procedure were thoroughly explained, informed consent was obtained.  The PENTAX GASTOROSCOPE [id number]  endoscope was introduced through the mouth  and advanced to the second portion of the duodenum Without limitations.      The instrument was slowly withdrawn as the mucosa was fully examined.    FINDINGS:: The Z-line was sharp and there was a 2-3 cm sliding hiatal hernia.  In the gastric antrum multiple erosions were identified.  cold biopsies were obtained.  In the duodenal bulb a large clean-based ulcer was identified.  It was friable, but no evidence of any active bleeding.  Stenosis was noted with the edema and ulceration.  No other  abnormalitiess identified distal to this point.   Retroflexed views revealed no abnormalities.     The scope was then withdrawn from the patient and the procedure terminated.  COMPLICATIONS: There were no complications. IMPRESSION: 1) No bleeding large clean-based duodenal bulb ulcer. 2) Sliding hiatal hernia. 3) Gastric erosions.  RECOMMENDATIONS: 1) Avoid NSAIDs. 2) PPI QD indefinitely. 3) Sucralfate 1 gram QID x 1 month.  _______________________________ Rosalie DoctorJeani Hawking, MD 03/10/2013 3:44 PM

## 2013-03-10 NOTE — ED Provider Notes (Signed)
History    CSN: [id number] Arrival date & time 03/10/13  0327  First MD Initiated Contact with Patient 03/10/13 [phone]     Chief Complaint  Patient presents with  . Abdominal Pain   (Consider location/radiation/quality/duration/timing/severity/associated sxs/prior Treatment) HPI History provided by patient. Abdominal pain onset 2 days ago and feels worse tonight. Pain radiates to just below the umbilicus, pain is both sharp and dull and worse when she pushes on it. No known alleviating factors. No associated nausea vomiting or diarrhea. No chest pain or shortness of breath. No constipation or blood in stools. No history of similar symptoms. Pain does not seem to be affected by eating. No heartburn or reflux. No back pain.  Past Medical History  Diagnosis Date  . Arthritis   . Rash     inner legs- ? med allergy- to see PCP if doesnt improve by end of week  . Overactive bladder   . Myopathy, unspecified   . Benign paroxysmal positional vertigo   . Right bundle branch block   . Unspecified tinnitus   . Osteoarthrosis, unspecified whether generalized or localized, unspecified site   . Osteoarthrosis, unspecified whether generalized or localized, lower leg   . Spinal stenosis, lumbar region, without neurogenic claudication   . Lumbago   . Unspecified urinary incontinence   . Urgency of urination   . Other abnormal blood chemistry   . Knee pain, left anterior   . Other disorders of bone and cartilage(733.99) 09/19/2012  . Urinary frequency 09/18/2012  . Other chest pain 08/28/2012  . Lump or mass in breast 08/01/2012  . Memory loss 01/22/2012  . Candidiasis 01/01/2012  . Unspecified constipation 12/18/2011  . Seborrheic keratoses 05/17/2011  . Myalgia and myositis, unspecified 05/17/2011  . Spasm of muscle 01/11/2011  . Cramp of limb 01/11/2011  . Disorder of bone and cartilage, unspecified 11/10/2010  . Dizziness and giddiness 11/10/2010  . Anemia, unspecified 09/07/2010  . Insomnia,  unspecified 09/07/2010  . Other abnormal blood chemistry 03/23/2010  . Osteoarthrosis, unspecified whether generalized or localized, lower leg 11/17/2009  . Allergic rhinitis due to pollen 07/01/2009  . Lumbago 07/01/2009   Past Surgical History  Procedure Laterality Date  . Back surgery      lumbar-- ? type  Dr Shelle Iron  . Eye surgery      bilateral cataract extraction with IOL  . Abdominal hysterectomy    . Cleft lip repair      with repair palate  . Breast surgery      right lumpectomy  . Total knee arthroplasty  12/13/2011    Procedure: TOTAL KNEE ARTHROPLASTY;  Surgeon: Javier Docker, MD;  Location: [facility];  Service: Orthopedics;  Laterality: Left;  . Breast lumpectomy  1993    breast cyst  . Appendectomy  1956   No family history on file. History  Substance Use Topics  . Smoking status: Never Smoker   . Smokeless tobacco: Never Used  . Alcohol Use: No     Comment: socially   wine   OB History   Grav Para Term Preterm Abortions TAB SAB Ect Mult Living                 Review of Systems  Constitutional: Negative for fever and chills.  HENT: Negative for neck pain and neck stiffness.   Eyes: Negative for pain.  Respiratory: Negative for shortness of breath.   Cardiovascular: Negative for chest pain.  Gastrointestinal: Positive for abdominal pain.  Genitourinary:  Negative for dysuria.  Musculoskeletal: Negative for back pain.  Skin: Negative for rash.  Neurological: Negative for headaches.  All other systems reviewed and are negative.    Allergies  Review of patient's allergies indicates no known allergies.  Home Medications   Current Outpatient Rx  Name  Route  Sig  Dispense  Refill  . aspirin 325 MG buffered tablet   Oral   Take 325 mg by mouth daily.         . bisacodyl (DULCOLAX) 5 MG EC tablet   Oral   Take 5 mg by mouth. Take one daily for constipation         . diclofenac sodium (VOLTAREN) 1 % GEL      Apply to painful knee 3 times daily   500  g   5   . Glucosamine HCl 1500 MG TABS   Oral   Take 1,500 mg by mouth daily.         Marland Kitchen HYDROcodone-acetaminophen (NORCO) 7.5-325 MG per tablet      One every 4 hours as needed for pain; take 2 tablets every 4 hours as needed for severe pain   30 tablet      . meclizine (ANTIVERT) 25 MG tablet   Oral   Take 25 mg by mouth 3 (three) times daily as needed. Vertigo          . metaxalone (SKELAXIN) 800 MG tablet   Oral   Take 800 mg by mouth daily.         . naproxen sodium (ALEVE) 220 MG tablet   Oral   Take 1 tablet (220 mg total) by mouth 2 (two) times daily with a meal.   100 tablet   5   . Oxybutynin Chloride (GELNIQUE) 10 % GEL   Transdermal   Place onto the skin. Apple one pack to thigh daily          There were no vitals taken for this visit. Physical Exam  Constitutional: She is oriented to person, place, and time. She appears well-developed and well-nourished.  HENT:  Head: Normocephalic and atraumatic.  Eyes: EOM are normal. Pupils are equal, round, and reactive to light.  Neck: Neck supple.  Cardiovascular: Normal rate, regular rhythm and intact distal pulses.   Pulmonary/Chest: Effort normal and breath sounds normal. No respiratory distress. She exhibits no tenderness.  Abdominal: Soft. Bowel sounds are normal. She exhibits no distension. There is no rebound.  Tender epigastric periumbilical and mid abdomen with some voluntary guarding. No pulsatile mass.   Musculoskeletal: Normal range of motion. She exhibits no edema.  Neurological: She is alert and oriented to person, place, and time.  Skin: Skin is warm and dry.    ED Course  Procedures (including critical care time)  Results for orders placed during the hospital encounter of 03/10/13  CBC WITH DIFFERENTIAL      Result Value Range   WBC 8.9  4.0 - 10.5 K/uL   RBC 5.04  3.87 - 5.11 MIL/uL   Hemoglobin 12.6  12.0 - 15.0 g/dL   HCT 16.1  09.6 - 04.5 %   MCV 77.4 (*) 78.0 - 100.0 fL   MCH  25.0 (*) 26.0 - 34.0 pg   MCHC 32.3  30.0 - 36.0 g/dL   RDW 40.9 (*) 81.1 - 91.4 %   Platelets 275  150 - 400 K/uL   Neutrophils Relative % 78 (*) 43 - 77 %   Neutro Abs 6.9  1.7 -  7.7 K/uL   Lymphocytes Relative 13  12 - 46 %   Lymphs Abs 1.2  0.7 - 4.0 K/uL   Monocytes Relative 7  3 - 12 %   Monocytes Absolute 0.6  0.1 - 1.0 K/uL   Eosinophils Relative 2  0 - 5 %   Eosinophils Absolute 0.2  0.0 - 0.7 K/uL   Basophils Relative 0  0 - 1 %   Basophils Absolute 0.0  0.0 - 0.1 K/uL  COMPREHENSIVE METABOLIC PANEL      Result Value Range   Sodium 138  135 - 145 mEq/L   Potassium 4.2  3.5 - 5.1 mEq/L   Chloride 104  96 - 112 mEq/L   CO2 23  19 - 32 mEq/L   Glucose, Bld 122 (*) 70 - 99 mg/dL   BUN 22  6 - 23 mg/dL   Creatinine, Ser 1.61  0.50 - 1.10 mg/dL   Calcium 9.5  8.4 - 09.6 mg/dL   Total Protein 7.2  6.0 - 8.3 g/dL   Albumin 3.4 (*) 3.5 - 5.2 g/dL   AST 27  0 - 37 U/L   ALT 23  0 - 35 U/L   Alkaline Phosphatase 140 (*) 39 - 117 U/L   Total Bilirubin 0.2 (*) 0.3 - 1.2 mg/dL   GFR calc non Af Amer 82 (*) >90 mL/min   GFR calc Af Amer >90  >90 mL/min  URINALYSIS, ROUTINE W REFLEX MICROSCOPIC      Result Value Range   Color, Urine YELLOW  YELLOW   APPearance CLOUDY (*) CLEAR   Specific Gravity, Urine 1.022  1.005 - 1.030   pH 6.0  5.0 - 8.0   Glucose, UA NEGATIVE  NEGATIVE mg/dL   Hgb urine dipstick NEGATIVE  NEGATIVE   Bilirubin Urine NEGATIVE  NEGATIVE   Ketones, ur NEGATIVE  NEGATIVE mg/dL   Protein, ur NEGATIVE  NEGATIVE mg/dL   Urobilinogen, UA 0.2  0.0 - 1.0 mg/dL   Nitrite NEGATIVE  NEGATIVE   Leukocytes, UA SMALL (*) NEGATIVE  LIPASE, BLOOD      Result Value Range   Lipase 22  11 - 59 U/L  URINE MICROSCOPIC-ADD ON      Result Value Range   Squamous Epithelial / LPF FEW (*) RARE   WBC, UA 3-6  <3 WBC/hpf   Bacteria, UA RARE  RARE  POCT I-STAT, CHEM 8      Result Value Range   Sodium 140  135 - 145 mEq/L   Potassium 4.1  3.5 - 5.1 mEq/L   Chloride 106  96 -  112 mEq/L   BUN 23  6 - 23 mg/dL   Creatinine, Ser 0.45  0.50 - 1.10 mg/dL   Glucose, Bld 409 (*) 70 - 99 mg/dL   Calcium, Ion 8.11  9.14 - 1.30 mmol/L   TCO2 24  0 - 100 mmol/L   Hemoglobin 13.6  12.0 - 15.0 g/dL   HCT 78.2  95.6 - 21.3 %  POCT I-STAT TROPONIN I      Result Value Range   Troponin i, poc 0.02  0.00 - 0.08 ng/mL   Comment 3            Ct Abdomen Pelvis W Contrast  03/10/2013   *RADIOLOGY REPORT*  Clinical Data: Epigastric abdominal pain.  CT ABDOMEN AND PELVIS WITH CONTRAST  Technique:  Multidetector CT imaging of the abdomen and pelvis was performed following the standard protocol during bolus administration of  intravenous contrast.  Contrast:  100 mL of Omnipaque 300 IV contrast  Comparison: Renal ultrasound performed 09/23/2012, and lumbar spine radiographs performed 04/23/2007  Findings: Mild bibasilar atelectasis or scarring is noted.  Significant soft tissue inflammation is noted about the antrum of the stomach and pylorus, with diffuse wall thickening.  Soft tissue inflammation extends to the adjacent gallbladder and pancreatic head, but the inflammation appears centered about the gastric antrum.  There appears to be focal erosion involving the anterior wall of the distal gastric antrum, which may reflect a focal gastric ulceration.  No free air is seen to suggest significant perforation.  The liver and spleen are unremarkable in appearance.  The gallbladder is within normal limits.  The pancreas and adrenal glands are unremarkable.  There is apparent mild chronic right-sided hydronephrosis; mild left-sided pelvicaliectasis remains within normal limits.  No distal obstructing stone is identified; this may reflect an underlying stricture at the right ureteropelvic junction, though not definitely characterized on delayed images.  A 2.3 cm cyst is noted at the posterior aspect of the right kidney. There is a small 2 mm stone near the upper pole of the left kidney, with an adjacent  small cyst.  No free fluid is identified.  The small bowel is unremarkable in appearance.  The stomach is within normal limits.  No acute vascular abnormalities are seen.  Mild scattered calcification is noted along the distal abdominal aorta and its branches.  The patient is status post appendectomy.  The colon is largely filled with stool, raising question for minimal constipation.  The bladder is mildly distended and grossly unremarkable in appearance.  The uterus is grossly unremarkable.  No suspicious adnexal masses are seen; the ovaries appear grossly symmetric.  No inguinal lymphadenopathy is seen.  No acute osseous abnormalities are identified.  There is relatively stable grade 2 anterolisthesis of L4 on L5, reflecting chronic bilateral pars defects at L4.  An associated set of pedicle screws is noted at L5.  IMPRESSION:  1.  Apparent focal erosion involving the anterior wall of the distal gastric antrum, which may reflect a focal gastric ulceration.  No free air seen to suggest significant perforation. A large amount of associated soft tissue inflammation is noted about the antrum of the stomach and pylorus, extending to the adjacent gallbladder and pancreatic head. 2.  Apparent mild chronic right-sided hydronephrosis, new from prior renal ultrasound; no distal obstructing stone seen.  This may reflect an underlying stricture at the right ureteropelvic junction, though not definitely characterized on delayed images. 3.  2 mm nonobstructing stone near the upper pole of the left kidney; small bilateral renal cysts seen. 4.  Mild scattered calcification along the distal abdominal aorta and its branches. 5.  Relatively stable grade 2 anterolisthesis of L4 on L5, reflecting chronic bilateral pars defects at L4.  Associated pedicle screws noted at L5. 6.  Mild bibasilar atelectasis or scarring noted.  These results were called by telephone on 03/10/2013 at 06:41 a.m. to Dr. Sunnie Nielsen, who verbally acknowledged  these results.   Original Report Authenticated By: Tonia Ghent, M.D.     Date: 03/10/2013  Rate: 73  Rhythm: normal sinus rhythm  QRS Axis: normal  Intervals: normal  ST/T Wave abnormalities: nonspecific ST changes  Conduction Disutrbances:nonspecific intraventricular conduction delay  Narrative Interpretation:   Old EKG Reviewed: unchanged  IV fentanyl and Zofran.  7:05 AM pain unchanged with IV narcotics. IV Protonix provided. medicine consult obtained, discussed with [facility], will admit  MDM  Epigastric abdominal pain with CT scan reviewed as above, concern for ulceration gastric antrum.   EKG. Labs.  Med admit   Sunnie Nielsen, MD 03/10/13 [phone]

## 2013-03-10 NOTE — ED Notes (Signed)
Pt altered mental at this time.  Pt was found playing with wet gel beads that was pulled apart from the pad inside her brief.  Pt playing with IV line asking, "what is this, what is this for, what's inside it?"  Pt pulling at stickers for EKG leads asking, "What is this, will this be here forever?"  MD made aware.

## 2013-03-10 NOTE — Consult Note (Addendum)
Reason for Consult: Abnormal CT scan and abdominal pain. Referring Physician: [facility]  Shary Key HPI: This is an 79 year old female who presents with worsening epigastric/RUQ pain.  She reports having this pain for the past week.  It is constant and she denies any prior history of this type of pain.  No use of any NSAIDs.  A CT scan of the ABM was performed and it revealed a significant inflammation around the antrum and there is a possible focal erosion.  Her HGB is stable and she is hemodynamically stable.  Past Medical History  Diagnosis Date  . Arthritis   . Rash     inner legs- ? med allergy- to see PCP if doesnt improve by end of week  . Overactive bladder   . Myopathy, unspecified   . Benign paroxysmal positional vertigo   . Right bundle branch block   . Unspecified tinnitus   . Osteoarthrosis, unspecified whether generalized or localized, unspecified site   . Osteoarthrosis, unspecified whether generalized or localized, lower leg   . Spinal stenosis, lumbar region, without neurogenic claudication   . Lumbago   . Unspecified urinary incontinence   . Urgency of urination   . Other abnormal blood chemistry   . Knee pain, left anterior   . Other disorders of bone and cartilage(733.99) 09/19/2012  . Urinary frequency 09/18/2012  . Other chest pain 08/28/2012  . Lump or mass in breast 08/01/2012  . Memory loss 01/22/2012  . Candidiasis 01/01/2012  . Unspecified constipation 12/18/2011  . Seborrheic keratoses 05/17/2011  . Myalgia and myositis, unspecified 05/17/2011  . Spasm of muscle 01/11/2011  . Cramp of limb 01/11/2011  . Disorder of bone and cartilage, unspecified 11/10/2010  . Dizziness and giddiness 11/10/2010  . Anemia, unspecified 09/07/2010  . Insomnia, unspecified 09/07/2010  . Other abnormal blood chemistry 03/23/2010  . Osteoarthrosis, unspecified whether generalized or localized, lower leg 11/17/2009  . Allergic rhinitis due to pollen 07/01/2009  . Lumbago  07/01/2009    Past Surgical History  Procedure Laterality Date  . Back surgery      lumbar-- ? type  Dr Shelle Iron  . Eye surgery      bilateral cataract extraction with IOL  . Abdominal hysterectomy    . Cleft lip repair      with repair palate  . Breast surgery      right lumpectomy  . Total knee arthroplasty  12/13/2011    Procedure: TOTAL KNEE ARTHROPLASTY;  Surgeon: Javier Docker, MD;  Location: [facility];  Service: Orthopedics;  Laterality: Left;  . Breast lumpectomy  1993    breast cyst  . Appendectomy  1956    History reviewed. No pertinent family history.  Social History:  reports that she has never smoked. She has never used smokeless tobacco. She reports that she does not drink alcohol or use illicit drugs.  Allergies: No Known Allergies  Medications:  Scheduled: . docusate sodium  100 mg Oral BID  . [START ON 03/13/2013] pantoprazole (PROTONIX) IV  40 mg Intravenous Q12H   Continuous: . sodium chloride 75 mL/hr at 03/10/13 0935  . sodium chloride    . pantoprozole (PROTONIX) infusion 8 mg/hr (03/10/13 0959)    Results for orders placed during the hospital encounter of 03/10/13 (from the past 24 hour(s))  URINALYSIS, ROUTINE W REFLEX MICROSCOPIC     Status: Abnormal   Collection Time    03/10/13  3:44 AM      Result Value Range  Color, Urine YELLOW  YELLOW   APPearance CLOUDY (*) CLEAR   Specific Gravity, Urine 1.022  1.005 - 1.030   pH 6.0  5.0 - 8.0   Glucose, UA NEGATIVE  NEGATIVE mg/dL   Hgb urine dipstick NEGATIVE  NEGATIVE   Bilirubin Urine NEGATIVE  NEGATIVE   Ketones, ur NEGATIVE  NEGATIVE mg/dL   Protein, ur NEGATIVE  NEGATIVE mg/dL   Urobilinogen, UA 0.2  0.0 - 1.0 mg/dL   Nitrite NEGATIVE  NEGATIVE   Leukocytes, UA SMALL (*) NEGATIVE  URINE MICROSCOPIC-ADD ON     Status: Abnormal   Collection Time    03/10/13  3:44 AM      Result Value Range   Squamous Epithelial / LPF FEW (*) RARE   WBC, UA 3-6  <3 WBC/hpf   Bacteria, UA RARE  RARE  CBC  WITH DIFFERENTIAL     Status: Abnormal   Collection Time    03/10/13  4:05 AM      Result Value Range   WBC 8.9  4.0 - 10.5 K/uL   RBC 5.04  3.87 - 5.11 MIL/uL   Hemoglobin 12.6  12.0 - 15.0 g/dL   HCT 24.4  01.0 - 27.2 %   MCV 77.4 (*) 78.0 - 100.0 fL   MCH 25.0 (*) 26.0 - 34.0 pg   MCHC 32.3  30.0 - 36.0 g/dL   RDW 53.6 (*) 64.4 - 03.4 %   Platelets 275  150 - 400 K/uL   Neutrophils Relative % 78 (*) 43 - 77 %   Neutro Abs 6.9  1.7 - 7.7 K/uL   Lymphocytes Relative 13  12 - 46 %   Lymphs Abs 1.2  0.7 - 4.0 K/uL   Monocytes Relative 7  3 - 12 %   Monocytes Absolute 0.6  0.1 - 1.0 K/uL   Eosinophils Relative 2  0 - 5 %   Eosinophils Absolute 0.2  0.0 - 0.7 K/uL   Basophils Relative 0  0 - 1 %   Basophils Absolute 0.0  0.0 - 0.1 K/uL  COMPREHENSIVE METABOLIC PANEL     Status: Abnormal   Collection Time    03/10/13  4:05 AM      Result Value Range   Sodium 138  135 - 145 mEq/L   Potassium 4.2  3.5 - 5.1 mEq/L   Chloride 104  96 - 112 mEq/L   CO2 23  19 - 32 mEq/L   Glucose, Bld 122 (*) 70 - 99 mg/dL   BUN 22  6 - 23 mg/dL   Creatinine, Ser 7.42  0.50 - 1.10 mg/dL   Calcium 9.5  8.4 - 59.5 mg/dL   Total Protein 7.2  6.0 - 8.3 g/dL   Albumin 3.4 (*) 3.5 - 5.2 g/dL   AST 27  0 - 37 U/L   ALT 23  0 - 35 U/L   Alkaline Phosphatase 140 (*) 39 - 117 U/L   Total Bilirubin 0.2 (*) 0.3 - 1.2 mg/dL   GFR calc non Af Amer 82 (*) >90 mL/min   GFR calc Af Amer >90  >90 mL/min  LIPASE, BLOOD     Status: None   Collection Time    03/10/13  4:05 AM      Result Value Range   Lipase 22  11 - 59 U/L  POCT I-STAT TROPONIN I     Status: None   Collection Time    03/10/13  4:11 AM  Result Value Range   Troponin i, poc 0.02  0.00 - 0.08 ng/mL   Comment 3           POCT I-STAT, CHEM 8     Status: Abnormal   Collection Time    03/10/13  4:13 AM      Result Value Range   Sodium 140  135 - 145 mEq/L   Potassium 4.1  3.5 - 5.1 mEq/L   Chloride 106  96 - 112 mEq/L   BUN 23  6 - 23  mg/dL   Creatinine, Ser 4.09  0.50 - 1.10 mg/dL   Glucose, Bld 811 (*) 70 - 99 mg/dL   Calcium, Ion 9.14  7.82 - 1.30 mmol/L   TCO2 24  0 - 100 mmol/L   Hemoglobin 13.6  12.0 - 15.0 g/dL   HCT 95.6  21.3 - 08.6 %     Ct Abdomen Pelvis W Contrast  03/10/2013   *RADIOLOGY REPORT*  Clinical Data: Epigastric abdominal pain.  CT ABDOMEN AND PELVIS WITH CONTRAST  Technique:  Multidetector CT imaging of the abdomen and pelvis was performed following the standard protocol during bolus administration of intravenous contrast.  Contrast:  100 mL of Omnipaque 300 IV contrast  Comparison: Renal ultrasound performed 09/23/2012, and lumbar spine radiographs performed 04/23/2007  Findings: Mild bibasilar atelectasis or scarring is noted.  Significant soft tissue inflammation is noted about the antrum of the stomach and pylorus, with diffuse wall thickening.  Soft tissue inflammation extends to the adjacent gallbladder and pancreatic head, but the inflammation appears centered about the gastric antrum.  There appears to be focal erosion involving the anterior wall of the distal gastric antrum, which may reflect a focal gastric ulceration.  No free air is seen to suggest significant perforation.  The liver and spleen are unremarkable in appearance.  The gallbladder is within normal limits.  The pancreas and adrenal glands are unremarkable.  There is apparent mild chronic right-sided hydronephrosis; mild left-sided pelvicaliectasis remains within normal limits.  No distal obstructing stone is identified; this may reflect an underlying stricture at the right ureteropelvic junction, though not definitely characterized on delayed images.  A 2.3 cm cyst is noted at the posterior aspect of the right kidney. There is a small 2 mm stone near the upper pole of the left kidney, with an adjacent small cyst.  No free fluid is identified.  The small bowel is unremarkable in appearance.  The stomach is within normal limits.  No acute  vascular abnormalities are seen.  Mild scattered calcification is noted along the distal abdominal aorta and its branches.  The patient is status post appendectomy.  The colon is largely filled with stool, raising question for minimal constipation.  The bladder is mildly distended and grossly unremarkable in appearance.  The uterus is grossly unremarkable.  No suspicious adnexal masses are seen; the ovaries appear grossly symmetric.  No inguinal lymphadenopathy is seen.  No acute osseous abnormalities are identified.  There is relatively stable grade 2 anterolisthesis of L4 on L5, reflecting chronic bilateral pars defects at L4.  An associated set of pedicle screws is noted at L5.  IMPRESSION:  1.  Apparent focal erosion involving the anterior wall of the distal gastric antrum, which may reflect a focal gastric ulceration.  No free air seen to suggest significant perforation. A large amount of associated soft tissue inflammation is noted about the antrum of the stomach and pylorus, extending to the adjacent gallbladder and pancreatic head. 2.  Apparent mild chronic right-sided hydronephrosis, new from prior renal ultrasound; no distal obstructing stone seen.  This may reflect an underlying stricture at the right ureteropelvic junction, though not definitely characterized on delayed images. 3.  2 mm nonobstructing stone near the upper pole of the left kidney; small bilateral renal cysts seen. 4.  Mild scattered calcification along the distal abdominal aorta and its branches. 5.  Relatively stable grade 2 anterolisthesis of L4 on L5, reflecting chronic bilateral pars defects at L4.  Associated pedicle screws noted at L5. 6.  Mild bibasilar atelectasis or scarring noted.  These results were called by telephone on 03/10/2013 at 06:41 a.m. to Dr. Sunnie Nielsen, who verbally acknowledged these results.   Original Report Authenticated By: Tonia Ghent, M.D.    ROS:  As stated above in the HPI otherwise negative.  Blood  pressure 129/86, pulse 85, temperature 98.3 F (36.8 C), temperature source Oral, resp. rate 17, height 5\' 5"  (1.651 m), weight 150 lb 8 oz (68.266 kg), SpO2 96.00%.    PE: Gen: NAD, Alert and Oriented HEENT:  [place]/AT, EOMI Neck: Supple, no LAD Lungs: CTA Bilaterally CV: RRR without M/G/R ABM: Soft, tender in the epigastrium and RUQ, +BS Ext: No C/C/E  Assessment/Plan: 1) Epigastric pain/RUQ pain. 2) Abnormal CT scan.   With the abdominal pain and the abnormal CT scan further evaluation with an EGD is warranted.  Plan: 1) EGD now.  Cierra Rothgeb D 03/10/2013, 3:08 PM   The patient apparently takes Naprosyn.

## 2013-03-10 NOTE — Progress Notes (Signed)
Patient transferred to endo, per RN in endo states she spoke to MD will see patient and get consent signed in endo.

## 2013-03-11 ENCOUNTER — Encounter (HOSPITAL_COMMUNITY): Payer: Self-pay | Admitting: Gastroenterology

## 2013-03-11 DIAGNOSIS — R109 Unspecified abdominal pain: Secondary | ICD-10-CM | POA: Diagnosis not present

## 2013-03-11 DIAGNOSIS — K253 Acute gastric ulcer without hemorrhage or perforation: Secondary | ICD-10-CM | POA: Diagnosis not present

## 2013-03-11 DIAGNOSIS — K2970 Gastritis, unspecified, without bleeding: Secondary | ICD-10-CM | POA: Diagnosis not present

## 2013-03-11 DIAGNOSIS — K269 Duodenal ulcer, unspecified as acute or chronic, without hemorrhage or perforation: Secondary | ICD-10-CM | POA: Diagnosis not present

## 2013-03-11 DIAGNOSIS — K259 Gastric ulcer, unspecified as acute or chronic, without hemorrhage or perforation: Secondary | ICD-10-CM | POA: Diagnosis present

## 2013-03-11 DIAGNOSIS — K449 Diaphragmatic hernia without obstruction or gangrene: Secondary | ICD-10-CM | POA: Diagnosis not present

## 2013-03-11 LAB — CBC
HCT: 38 % (ref 36.0–46.0)
MCHC: 31.3 g/dL (ref 30.0–36.0)
Platelets: 235 10*3/uL (ref 150–400)
RDW: 16.2 % — ABNORMAL HIGH (ref 11.5–15.5)
WBC: 6.2 10*3/uL (ref 4.0–10.5)

## 2013-03-11 LAB — BASIC METABOLIC PANEL
BUN: 11 mg/dL (ref 6–23)
Calcium: 9.4 mg/dL (ref 8.4–10.5)
Chloride: 109 mEq/L (ref 96–112)
Creatinine, Ser: 0.69 mg/dL (ref 0.50–1.10)
GFR calc Af Amer: >90 mL/min (ref >90)
GFR calc non Af Amer: 80 mL/min — ABNORMAL LOW (ref >90)

## 2013-03-11 MED ORDER — PANTOPRAZOLE SODIUM 40 MG PO TBEC: 40.0000 mg | DELAYED_RELEASE_TABLET | Freq: Two times a day (BID) | ORAL | Status: DC

## 2013-03-11 MED ORDER — SUCRALFATE 1 G PO TABS: 1.0000 g | ORAL_TABLET | Freq: Three times a day (TID) | ORAL | Status: DC

## 2013-03-11 NOTE — Discharge Summary (Signed)
Physician Discharge Summary  Janet Hernandez FAO:[id number] DOB: Jan 06, 1930 DOA: 03/10/2013  PCP: Kimber Relic, MD  Admit date: 03/10/2013 Discharge date: 03/11/2013  Time spent: 40 minutes  Recommendations for Outpatient Follow-up:  1. Followup with PCP in one week. Patient is being discharged on PPI twice daily and sucralfate 1 g 3 times a day for one month . Following this she should be on PPI once daily indefinitely. 2.  Followup with Dr. Elnoria Howard in 2 weeks . 3. Patient showed ovoid Naprosyn and other and other and NSAIDs. She is also on a full dose aspirin which Janet have held for now. She does not have any history of CAD .When she follows up with her PCP it should be addressed if she needs aspirin and if so can restarted on a baby aspirin.  Discharge Diagnoses:   Principal Problem:   Duodenal ulcer without hemorrhage or perforation  Active Problems:   Gastric erosions   Knee pain, left anterior   Abdominal pain   Discharge Condition:fair  Diet recommendation:  full liquid for today. Advance to regular diet from tomorrow   Filed Weights   03/10/13 1100  Weight: 68.266 kg (150 lb 8 oz)    History of present illness:   please refer to admission H&P for details but in brief, IVIONA Hernandez is an 79 y.o. female with past medical history significant for arthritis, vertigo who presents to the emergency department complaining of worsening abdominal pain overnight. Patient had abdominal pain for a week. Pain was in the epigastric area, sharp in quality,  No alleviating or exacerbating factors. She denies vomiting, melena hematochezia, hematemesis. She reported taking 2 Aleve every day for arthritis. She denied significant weight loss.  Patient had a CT scan that showed apparent focal erosion involving the anterior wall of the distal gastric antrum which may reflect a focal gastric ulceration. No free air seen to suggest significant perforation.  Patient was admitted to the medical  floor and started on IV fluids and Protonix drip. Aspirin and Aleve were discontinued. GI Dr. Elnoria Howard was consulted.  Hospital Course:  Acute duodenal ulcer Patient admitted to medical floor. Kept n.p.o. and given IV fluids and Protonix drip. Seen by GI consult Dr. Elnoria Howard who the patient to endoscopy and found a nonbleeding duodenal ulcer with a clean base over the duodenal bulb. Also noted for gastric he wasn't and sliding hiatal hernia. The symptoms were likely in the setting of NSAIDs use.  Recommended stopping them and put patient on PPI twice daily for one month and sucralfate 1 g 3 times a day for a month, and following which she should be on PPI once daily indefinitely. Patient clinically stable with stable hemoglobin and her abdominal pain has now resolved. The patient has been clearly instructed to avoid any form of NSAIDs. Janet have also held her aspirin upon discharge. She is on full dose of aspirin and it is unclear if she really needs it. When she follows up with her PCP in 1 week this can be addressed and if needed can be started on baby aspirin.  Patient placed on clear liquids post EGD and advanced to full liquids this morning and tolerating well. She stable for discharge home.  Procedures:   EGD on 7/15   Consultations:   Dr. Elnoria Howard with GI  Discharge Exam: Filed Vitals:   03/10/13 1650 03/10/13 1816 03/10/13 2139 03/11/13 0551  BP: 156/76 112/77 120/71 113/67  Pulse: 79 76 83 68  Temp: 97.4  F (36.3 C) 98.1 F (36.7 C) 97.9 F (36.6 C) 97.5 F (36.4 C)  TempSrc: Oral  Oral Oral  Resp: 16 20 16 16   Height:      Weight:      SpO2: 94% 95% 96% 93%    General:  elderly female in no acute distress HEENT: No pallor, moist oral mucosa Chest: Clear to auscultation bilaterally, no added sounds CVS: Normal S1-S2, no murmurs Abdomen: Soft, nontender, nondistended, bowel sounds present Extremities: Warm, no edema CNS: AAO x3  Discharge Instructions   Future Appointments  Provider Department Dept Phone   03/19/2013 2:15 PM Kimber Relic, MD [facility] [phone]       Medication List    STOP taking these medications       aspirin 325 MG buffered tablet     naproxen sodium 220 MG tablet  Commonly known as:  ALEVE      TAKE these medications       bisacodyl 5 MG EC tablet  Commonly known as:  DULCOLAX  Take 5 mg by mouth. Take one daily for constipation     diclofenac sodium 1 % Gel  Commonly known as:  VOLTAREN  Apply to painful knee 3 times daily     GELNIQUE 10 % Gel  Generic drug:  Oxybutynin Chloride  Place onto the skin. Apple one pack to thigh daily     Glucosamine HCl 1500 MG Tabs  Take 1,500 mg by mouth daily.     HYDROcodone-acetaminophen 7.5-325 MG per tablet  Commonly known as:  NORCO  One every 4 hours as needed for pain; take 2 tablets every 4 hours as needed for severe pain     meclizine 25 MG tablet  Commonly known as:  ANTIVERT  - Take 25 mg by mouth 3 (three) times daily as needed. Vertigo  -      metaxalone 800 MG tablet  Commonly known as:  SKELAXIN  Take 800 mg by mouth daily.     pantoprazole 40 MG tablet  Commonly known as:  PROTONIX  Take 1 tablet (40 mg total) by mouth 2 (two) times daily.     sucralfate 1 G tablet  Commonly known as:  CARAFATE  Take 1 tablet (1 g total) by mouth 4 (four) times daily -  with meals and at bedtime.       No Known Allergies     Follow-up Information   Follow up with GREEN, Lenon Curt, MD. Schedule an appointment as soon as possible for a visit in 1 week.   Contact information:   [address] [place] [address] [phone]       Follow up with HUNG,PATRICK D, MD. Call in 2 weeks.   Contact information:   [address] [place] [address] [phone]        The results of significant diagnostics from this hospitalization (including imaging, microbiology, ancillary and laboratory) are listed below for reference.     Significant Diagnostic Studies: Ct Abdomen Pelvis W Contrast  03/10/2013   *RADIOLOGY REPORT*  Clinical Data: Epigastric abdominal pain.  CT ABDOMEN AND PELVIS WITH CONTRAST  Technique:  Multidetector CT imaging of the abdomen and pelvis was performed following the standard protocol during bolus administration of intravenous contrast.  Contrast:  100 mL of Omnipaque 300 IV contrast  Comparison: Renal ultrasound performed 09/23/2012, and lumbar spine radiographs performed 04/23/2007  Findings: Mild bibasilar atelectasis or scarring is noted.  Significant soft tissue inflammation  is noted about the antrum of the stomach and pylorus, with diffuse wall thickening.  Soft tissue inflammation extends to the adjacent gallbladder and pancreatic head, but the inflammation appears centered about the gastric antrum.  There appears to be focal erosion involving the anterior wall of the distal gastric antrum, which may reflect a focal gastric ulceration.  No free air is seen to suggest significant perforation.  The liver and spleen are unremarkable in appearance.  The gallbladder is within normal limits.  The pancreas and adrenal glands are unremarkable.  There is apparent mild chronic right-sided hydronephrosis; mild left-sided pelvicaliectasis remains within normal limits.  No distal obstructing stone is identified; this may reflect an underlying stricture at the right ureteropelvic junction, though not definitely characterized on delayed images.  A 2.3 cm cyst is noted at the posterior aspect of the right kidney. There is a small 2 mm stone near the upper pole of the left kidney, with an adjacent small cyst.  No free fluid is identified.  The small bowel is unremarkable in appearance.  The stomach is within normal limits.  No acute vascular abnormalities are seen.  Mild scattered calcification is noted along the distal abdominal aorta and its branches.  The patient is status post appendectomy.  The colon is largely  filled with stool, raising question for minimal constipation.  The bladder is mildly distended and grossly unremarkable in appearance.  The uterus is grossly unremarkable.  No suspicious adnexal masses are seen; the ovaries appear grossly symmetric.  No inguinal lymphadenopathy is seen.  No acute osseous abnormalities are identified.  There is relatively stable grade 2 anterolisthesis of L4 on L5, reflecting chronic bilateral pars defects at L4.  An associated set of pedicle screws is noted at L5.  IMPRESSION:  1.  Apparent focal erosion involving the anterior wall of the distal gastric antrum, which may reflect a focal gastric ulceration.  No free air seen to suggest significant perforation. A large amount of associated soft tissue inflammation is noted about the antrum of the stomach and pylorus, extending to the adjacent gallbladder and pancreatic head. 2.  Apparent mild chronic right-sided hydronephrosis, new from prior renal ultrasound; no distal obstructing stone seen.  This may reflect an underlying stricture at the right ureteropelvic junction, though not definitely characterized on delayed images. 3.  2 mm nonobstructing stone near the upper pole of the left kidney; small bilateral renal cysts seen. 4.  Mild scattered calcification along the distal abdominal aorta and its branches. 5.  Relatively stable grade 2 anterolisthesis of L4 on L5, reflecting chronic bilateral pars defects at L4.  Associated pedicle screws noted at L5. 6.  Mild bibasilar atelectasis or scarring noted.  These results were called by telephone on 03/10/2013 at 06:41 a.m. to Dr. Sunnie Nielsen, who verbally acknowledged these results.   Original Report Authenticated By: Tonia Ghent, M.D.    Microbiology: No results found for this or any previous visit (from the past 240 hour(s)).   Labs: Basic Metabolic Panel:  Recent Labs Lab 03/10/13 0405 03/10/13 0413 03/11/13 0446  NA 138 140 142  K 4.2 4.1 3.9  CL 104 106 109  CO2  23  --  23  GLUCOSE 122* 119* 106*  BUN 22 23 11   CREATININE 0.64 0.70 0.69  CALCIUM 9.5  --  9.4   Liver Function Tests:  Recent Labs Lab 03/10/13 0405  AST 27  ALT 23  ALKPHOS 140*  BILITOT 0.2*  PROT 7.2  ALBUMIN 3.4*  Recent Labs Lab 03/10/13 0405  LIPASE 22   No results found for this basename: AMMONIA,  in the last 168 hours CBC:  Recent Labs Lab 03/10/13 0405 03/10/13 0413 03/11/13 0446  WBC 8.9  --  6.2  NEUTROABS 6.9  --   --   HGB 12.6 13.6 11.9*  HCT 39.0 40.0 38.0  MCV 77.4*  --  77.7*  PLT 275  --  235   Cardiac Enzymes: No results found for this basename: CKTOTAL, CKMB, CKMBINDEX, TROPONINI,  in the last 168 hours BNP: BNP (last 3 results) No results found for this basename: PROBNP,  in the last 8760 hours CBG: No results found for this basename: GLUCAP,  in the last 168 hours     Signed:  Peytyn Trine  [facility] 03/11/2013, 10:26 AM

## 2013-03-11 NOTE — Progress Notes (Signed)
CSW met with pt this am to assist with d/c planning. Pt is from [facility] and plans to return to her apt following hospital d/c. [facility] has confirmed d/c plan. RNCM will assist with any home care needs.  Cori Razor LCSW [phone]

## 2013-03-11 NOTE — Progress Notes (Signed)
Subjective: She feels well at this time.  Objective: Vital signs in last 24 hours: Temp:  [97.2 F (36.2 C)-98.4 F (36.9 C)] 97.9 F (36.6 C) (07/15 2139) Pulse Rate:  [71-86] 83 (07/15 2139) Resp:  [11-20] 16 (07/15 2139) BP: (111-162)/(54-100) 120/71 mmHg (07/15 2139) SpO2:  [94 %-99 %] 96 % (07/15 2139) Weight:  [150 lb 8 oz (68.266 kg)] 150 lb 8 oz (68.266 kg) (07/15 1100) Last BM Date: 03/09/13  Intake/Output from previous day: 07/15 0701 - 07/16 0700 In: 2006.7 [P.O.:600; I.V.:1406.7] Out: 550 [Urine:550] Intake/Output this shift: Total I/O In: 645 [I.V.:645] Out: 0   General appearance: alert and no distress GI: soft, non-tender; bowel sounds normal; no masses,  no organomegaly  Lab Results:  Recent Labs  03/10/13 0405 03/10/13 0413  WBC 8.9  --   HGB 12.6 13.6  HCT 39.0 40.0  PLT 275  --    BMET  Recent Labs  03/10/13 0405 03/10/13 0413  NA 138 140  K 4.2 4.1  CL 104 106  CO2 23  --   GLUCOSE 122* 119*  BUN 22 23  CREATININE 0.64 0.70  CALCIUM 9.5  --    LFT  Recent Labs  03/10/13 0405  PROT 7.2  ALBUMIN 3.4*  AST 27  ALT 23  ALKPHOS 140*  BILITOT 0.2*   PT/INR No results found for this basename: LABPROT, INR,  in the last 72 hours Hepatitis Panel No results found for this basename: HEPBSAG, HCVAB, HEPAIGM, HEPBIGM,  in the last 72 hours C-Diff No results found for this basename: CDIFFTOX,  in the last 72 hours Fecal Lactopherrin No results found for this basename: FECLLACTOFRN,  in the last 72 hours  Studies/Results: Ct Abdomen Pelvis W Contrast  03/10/2013   *RADIOLOGY REPORT*  Clinical Data: Epigastric abdominal pain.  CT ABDOMEN AND PELVIS WITH CONTRAST  Technique:  Multidetector CT imaging of the abdomen and pelvis was performed following the standard protocol during bolus administration of intravenous contrast.  Contrast:  100 mL of Omnipaque 300 IV contrast  Comparison: Renal ultrasound performed 09/23/2012, and lumbar spine  radiographs performed 04/23/2007  Findings: Mild bibasilar atelectasis or scarring is noted.  Significant soft tissue inflammation is noted about the antrum of the stomach and pylorus, with diffuse wall thickening.  Soft tissue inflammation extends to the adjacent gallbladder and pancreatic head, but the inflammation appears centered about the gastric antrum.  There appears to be focal erosion involving the anterior wall of the distal gastric antrum, which may reflect a focal gastric ulceration.  No free air is seen to suggest significant perforation.  The liver and spleen are unremarkable in appearance.  The gallbladder is within normal limits.  The pancreas and adrenal glands are unremarkable.  There is apparent mild chronic right-sided hydronephrosis; mild left-sided pelvicaliectasis remains within normal limits.  No distal obstructing stone is identified; this may reflect an underlying stricture at the right ureteropelvic junction, though not definitely characterized on delayed images.  A 2.3 cm cyst is noted at the posterior aspect of the right kidney. There is a small 2 mm stone near the upper pole of the left kidney, with an adjacent small cyst.  No free fluid is identified.  The small bowel is unremarkable in appearance.  The stomach is within normal limits.  No acute vascular abnormalities are seen.  Mild scattered calcification is noted along the distal abdominal aorta and its branches.  The patient is status post appendectomy.  The colon is largely  filled with stool, raising question for minimal constipation.  The bladder is mildly distended and grossly unremarkable in appearance.  The uterus is grossly unremarkable.  No suspicious adnexal masses are seen; the ovaries appear grossly symmetric.  No inguinal lymphadenopathy is seen.  No acute osseous abnormalities are identified.  There is relatively stable grade 2 anterolisthesis of L4 on L5, reflecting chronic bilateral pars defects at L4.  An associated  set of pedicle screws is noted at L5.  IMPRESSION:  1.  Apparent focal erosion involving the anterior wall of the distal gastric antrum, which may reflect a focal gastric ulceration.  No free air seen to suggest significant perforation. A large amount of associated soft tissue inflammation is noted about the antrum of the stomach and pylorus, extending to the adjacent gallbladder and pancreatic head. 2.  Apparent mild chronic right-sided hydronephrosis, new from prior renal ultrasound; no distal obstructing stone seen.  This may reflect an underlying stricture at the right ureteropelvic junction, though not definitely characterized on delayed images. 3.  2 mm nonobstructing stone near the upper pole of the left kidney; small bilateral renal cysts seen. 4.  Mild scattered calcification along the distal abdominal aorta and its branches. 5.  Relatively stable grade 2 anterolisthesis of L4 on L5, reflecting chronic bilateral pars defects at L4.  Associated pedicle screws noted at L5. 6.  Mild bibasilar atelectasis or scarring noted.  These results were called by telephone on 03/10/2013 at 06:41 a.m. to Dr. Sunnie Nielsen, who verbally acknowledged these results.   Original Report Authenticated By: Tonia Ghent, M.D.    Medications:  Scheduled: . docusate sodium  100 mg Oral BID  . [START ON 03/13/2013] pantoprazole (PROTONIX) IV  40 mg Intravenous Q12H  . sucralfate  1 g Oral TID WC & HS   Continuous: . sodium chloride 75 mL/hr at 03/10/13 0935  . pantoprozole (PROTONIX) infusion 8 mg/hr (03/10/13 2208)    Assessment/Plan: 1) Large clean-based duodenal ulcer with stenosis, but no obstruction. 2) Gastric erosions.   Most likely her findings are secondary to the use of Naprosyn.  She is stable hemodynamically as well as her HGB.  Plan: 1) Protonix BID with sucralfate TID x 1 month. 2) Stop NSAIDs. 3) Follow up in the office in 2 weeks.  LOS: 1 day   Janet Hernandez D 03/11/2013, 2:44 AM

## 2013-03-18 ENCOUNTER — Encounter: Payer: Self-pay | Admitting: Internal Medicine

## 2013-03-18 DIAGNOSIS — M545 Low back pain: Secondary | ICD-10-CM | POA: Insufficient documentation

## 2013-03-18 DIAGNOSIS — H8110 Benign paroxysmal vertigo, unspecified ear: Secondary | ICD-10-CM | POA: Insufficient documentation

## 2013-03-19 ENCOUNTER — Encounter: Payer: Self-pay | Admitting: Internal Medicine

## 2013-03-19 ENCOUNTER — Non-Acute Institutional Stay: Payer: Medicare Other | Admitting: Internal Medicine

## 2013-03-19 VITALS — BP 104/62 | HR 68 | Ht 65.0 in | Wt 152.0 lb

## 2013-03-19 DIAGNOSIS — M159 Polyosteoarthritis, unspecified: Secondary | ICD-10-CM | POA: Insufficient documentation

## 2013-03-19 DIAGNOSIS — R413 Other amnesia: Secondary | ICD-10-CM | POA: Diagnosis not present

## 2013-03-19 DIAGNOSIS — K253 Acute gastric ulcer without hemorrhage or perforation: Secondary | ICD-10-CM

## 2013-03-19 DIAGNOSIS — H8110 Benign paroxysmal vertigo, unspecified ear: Secondary | ICD-10-CM | POA: Diagnosis not present

## 2013-03-19 DIAGNOSIS — M1710 Unilateral primary osteoarthritis, unspecified knee: Secondary | ICD-10-CM

## 2013-03-19 DIAGNOSIS — M545 Low back pain: Secondary | ICD-10-CM | POA: Diagnosis not present

## 2013-03-19 DIAGNOSIS — K269 Duodenal ulcer, unspecified as acute or chronic, without hemorrhage or perforation: Secondary | ICD-10-CM | POA: Diagnosis not present

## 2013-03-19 DIAGNOSIS — M1711 Unilateral primary osteoarthritis, right knee: Secondary | ICD-10-CM

## 2013-03-19 NOTE — Progress Notes (Signed)
Subjective:    Patient ID: Janet Hernandez, female    DOB: [birth year], 79 y.o.   MRN: [id number]  HPI Since I last saw her, she was admitted to the hospital on 03/10/13. Developed acute abd pain that was attributed to duodenal bulb ulcer and gastric erosions seen on EGD 03/10/13 by Dr. Elnoria Howard, GI. She is much better since on protonix and carafate.  Right knee is uncomfortable, but a little better. She continues to use Voltaren Gel.  Current Outpatient Prescriptions on File Prior to Visit  Medication Sig Dispense Refill  . bisacodyl (DULCOLAX) 5 MG EC tablet Take 5 mg by mouth. Take one daily for constipation      . diclofenac sodium (VOLTAREN) 1 % GEL Apply to painful knee 3 times daily  500 g  5  . Glucosamine HCl 1500 MG TABS Take 1,500 mg by mouth daily.      . meclizine (ANTIVERT) 25 MG tablet Take 25 mg by mouth 3 (three) times daily as needed. Vertigo       . metaxalone (SKELAXIN) 800 MG tablet Take 800 mg by mouth daily.      . Oxybutynin Chloride (GELNIQUE) 10 % GEL Place onto the skin. Apple one pack to thigh daily      . pantoprazole (PROTONIX) 40 MG tablet Take 1 tablet (40 mg total) by mouth 2 (two) times daily.  60 tablet  0  . sucralfate (CARAFATE) 1 G tablet Take 1 tablet (1 g total) by mouth 4 (four) times daily -  with meals and at bedtime.  90 tablet  0   No current facility-administered medications on file prior to visit.     Review of Systems  Constitutional: Negative.  Negative for fever.  Eyes: Negative.   Respiratory: Negative.   Cardiovascular: Negative.   Musculoskeletal: Positive for joint swelling and arthralgias.       Pain and swelling at the left knee. Not inflamed or hot.  Skin: Negative.        Objective:BP 104/62  Pulse 68  Ht 5\' 5"  (1.651 m)  Wt 152 lb (68.947 kg)  BMI 25.29 kg/m2    Physical Exam  Constitutional: She is oriented to person, place, and time. She appears well-developed and well-nourished. She appears distressed.  Neck: No JVD  present. No tracheal deviation present. No thyromegaly present.  Cardiovascular: Normal rate, regular rhythm and normal heart sounds.  Exam reveals no gallop and no friction rub.   No murmur heard. Pulmonary/Chest: Breath sounds normal. No respiratory distress.  Abdominal: Soft. Bowel sounds are normal. She exhibits no distension and no mass. There is no tenderness. There is no guarding.  Musculoskeletal: She exhibits tenderness. She exhibits no edema.  Pain and swelling at the left knee. Old surgical scar at the left knee.  Lymphadenopathy:    She has no cervical adenopathy.  Neurological: She is alert and oriented to person, place, and time. No cranial nerve deficit.  Skin: No rash noted. No erythema. No pallor.  Psychiatric: She has a normal mood and affect. Her behavior is normal. Judgment and thought content normal.     Admission on 03/10/2013, Discharged on 03/11/2013  Component Date Value Range Status  . WBC 03/10/2013 8.9  4.0 - 10.5 K/uL Final  . RBC 03/10/2013 5.04  3.87 - 5.11 MIL/uL Final  . Hemoglobin 03/10/2013 12.6  12.0 - 15.0 g/dL Final  . HCT 30/86/5784 39.0  36.0 - 46.0 % Final  . MCV 03/10/2013 77.4* 78.0 - 100.0  fL Final  . MCH 03/10/2013 25.0* 26.0 - 34.0 pg Final  . MCHC 03/10/2013 32.3  30.0 - 36.0 g/dL Final  . RDW 40/98/1191 16.1* 11.5 - 15.5 % Final  . Platelets 03/10/2013 275  150 - 400 K/uL Final  . Neutrophils Relative % 03/10/2013 78* 43 - 77 % Final  . Neutro Abs 03/10/2013 6.9  1.7 - 7.7 K/uL Final  . Lymphocytes Relative 03/10/2013 13  12 - 46 % Final  . Lymphs Abs 03/10/2013 1.2  0.7 - 4.0 K/uL Final  . Monocytes Relative 03/10/2013 7  3 - 12 % Final  . Monocytes Absolute 03/10/2013 0.6  0.1 - 1.0 K/uL Final  . Eosinophils Relative 03/10/2013 2  0 - 5 % Final  . Eosinophils Absolute 03/10/2013 0.2  0.0 - 0.7 K/uL Final  . Basophils Relative 03/10/2013 0  0 - 1 % Final  . Basophils Absolute 03/10/2013 0.0  0.0 - 0.1 K/uL Final  . Sodium 03/10/2013  138  135 - 145 mEq/L Final  . Potassium 03/10/2013 4.2  3.5 - 5.1 mEq/L Final  . Chloride 03/10/2013 104  96 - 112 mEq/L Final  . CO2 03/10/2013 23  19 - 32 mEq/L Final  . Glucose, Bld 03/10/2013 122* 70 - 99 mg/dL Final  . BUN 47/82/9562 22  6 - 23 mg/dL Final  . Creatinine, Ser 03/10/2013 0.64  0.50 - 1.10 mg/dL Final  . Calcium 13/03/6577 9.5  8.4 - 10.5 mg/dL Final  . Total Protein 03/10/2013 7.2  6.0 - 8.3 g/dL Final  . Albumin 46/96/2952 3.4* 3.5 - 5.2 g/dL Final  . AST 84/13/2440 27  0 - 37 U/L Final  . ALT 03/10/2013 23  0 - 35 U/L Final  . Alkaline Phosphatase 03/10/2013 140* 39 - 117 U/L Final  . Total Bilirubin 03/10/2013 0.2* 0.3 - 1.2 mg/dL Final  . GFR calc non Af Amer 03/10/2013 82* >90 mL/min Final  . GFR calc Af Amer 03/10/2013 >90  >90 mL/min Final   Comment:                                 The eGFR has been calculated                          using the CKD EPI equation.                          This calculation has not been                          validated in all clinical                          situations.                          eGFR's persistently                          <90 mL/min signify                          possible Chronic Kidney Disease.  . Color, Urine 03/10/2013 YELLOW  YELLOW Final  . APPearance 03/10/2013 CLOUDY* CLEAR Final  .  Specific Gravity, Urine 03/10/2013 1.022  1.005 - 1.030 Final  . pH 03/10/2013 6.0  5.0 - 8.0 Final  . Glucose, UA 03/10/2013 NEGATIVE  NEGATIVE mg/dL Final  . Hgb urine dipstick 03/10/2013 NEGATIVE  NEGATIVE Final  . Bilirubin Urine 03/10/2013 NEGATIVE  NEGATIVE Final  . Ketones, ur 03/10/2013 NEGATIVE  NEGATIVE mg/dL Final  . Protein, ur 14/78/2956 NEGATIVE  NEGATIVE mg/dL Final  . Urobilinogen, UA 03/10/2013 0.2  0.0 - 1.0 mg/dL Final  . Nitrite 21/30/8657 NEGATIVE  NEGATIVE Final  . Leukocytes, UA 03/10/2013 SMALL* NEGATIVE Final  . Lipase 03/10/2013 22  11 - 59 U/L Final  . Squamous Epithelial / LPF 03/10/2013  FEW* RARE Final  . WBC, UA 03/10/2013 3-6  <3 WBC/hpf Final  . Bacteria, UA 03/10/2013 RARE  RARE Final  . Sodium 03/10/2013 140  135 - 145 mEq/L Final  . Potassium 03/10/2013 4.1  3.5 - 5.1 mEq/L Final  . Chloride 03/10/2013 106  96 - 112 mEq/L Final  . BUN 03/10/2013 23  6 - 23 mg/dL Final  . Creatinine, Ser 03/10/2013 0.70  0.50 - 1.10 mg/dL Final  . Glucose, Bld 84/69/6295 119* 70 - 99 mg/dL Final  . Calcium, Ion 28/41/3244 1.22  1.13 - 1.30 mmol/L Final  . TCO2 03/10/2013 24  0 - 100 mmol/L Final  . Hemoglobin 03/10/2013 13.6  12.0 - 15.0 g/dL Final  . HCT 08/29/7251 40.0  36.0 - 46.0 % Final  . Troponin i, poc 03/10/2013 0.02  0.00 - 0.08 ng/mL Final  . Comment 3 03/10/2013          Final   Comment: Due to the release kinetics of cTnI,                          a negative result within the first hours                          of the onset of symptoms does not rule out                          myocardial infarction with certainty.                          If myocardial infarction is still suspected,                          repeat the test at appropriate intervals.  . Sodium 03/11/2013 142  135 - 145 mEq/L Final  . Potassium 03/11/2013 3.9  3.5 - 5.1 mEq/L Final  . Chloride 03/11/2013 109  96 - 112 mEq/L Final  . CO2 03/11/2013 23  19 - 32 mEq/L Final  . Glucose, Bld 03/11/2013 106* 70 - 99 mg/dL Final  . BUN 66/44/0347 11  6 - 23 mg/dL Final   Comment: RESULT REPEATED AND VERIFIED                          DELTA CHECK NOTED  . Creatinine, Ser 03/11/2013 0.69  0.50 - 1.10 mg/dL Final  . Calcium 42/59/5638 9.4  8.4 - 10.5 mg/dL Final  . GFR calc non Af Amer 03/11/2013 80* >90 mL/min Final  . GFR calc Af Amer 03/11/2013 >90  >90 mL/min Final   Comment:  The eGFR has been calculated                          using the CKD EPI equation.                          This calculation has not been                          validated in all clinical                           situations.                          eGFR's persistently                          <90 mL/min signify                          possible Chronic Kidney Disease.  . WBC 03/11/2013 6.2  4.0 - 10.5 K/uL Final  . RBC 03/11/2013 4.89  3.87 - 5.11 MIL/uL Final  . Hemoglobin 03/11/2013 11.9* 12.0 - 15.0 g/dL Final  . HCT 40/98/1191 38.0  36.0 - 46.0 % Final  . MCV 03/11/2013 77.7* 78.0 - 100.0 fL Final  . MCH 03/11/2013 24.3* 26.0 - 34.0 pg Final  . MCHC 03/11/2013 31.3  30.0 - 36.0 g/dL Final  . RDW 47/82/9562 16.2* 11.5 - 15.5 % Final  . Platelets 03/11/2013 235  150 - 400 K/uL Final        Assessment & Plan:  Benign paroxysmal positional vertigo; improved  Lumbago: stable  Memory loss: worsening  Duodenal ulcer without hemorrhage or perforation: better on current medications. Should avoid NSAID in any form.  Gastric erosions, acute: seen on EGD  Osteoarthritis of right knee: has order for hydrocodone prn

## 2013-03-19 NOTE — Patient Instructions (Signed)
Avoid Voltaren and Aleve.

## 2013-03-27 DIAGNOSIS — N200 Calculus of kidney: Secondary | ICD-10-CM | POA: Diagnosis not present

## 2013-03-27 DIAGNOSIS — N281 Cyst of kidney, acquired: Secondary | ICD-10-CM | POA: Diagnosis not present

## 2013-03-27 DIAGNOSIS — N3941 Urge incontinence: Secondary | ICD-10-CM | POA: Diagnosis not present

## 2013-03-27 DIAGNOSIS — N1330 Unspecified hydronephrosis: Secondary | ICD-10-CM | POA: Diagnosis not present

## 2013-04-01 ENCOUNTER — Other Ambulatory Visit (HOSPITAL_COMMUNITY): Payer: Self-pay | Admitting: Urology

## 2013-04-01 DIAGNOSIS — N1330 Unspecified hydronephrosis: Secondary | ICD-10-CM

## 2013-04-06 ENCOUNTER — Encounter (HOSPITAL_COMMUNITY)
Admission: RE | Admit: 2013-04-06 | Discharge: 2013-04-06 | Disposition: A | Discharge status: Home / Self Care | Payer: Medicare Other | Source: Ambulatory Visit | Attending: Urology | Admitting: Urology

## 2013-04-06 DIAGNOSIS — N2889 Other specified disorders of kidney and ureter: Secondary | ICD-10-CM | POA: Diagnosis not present

## 2013-04-06 DIAGNOSIS — N1330 Unspecified hydronephrosis: Secondary | ICD-10-CM | POA: Insufficient documentation

## 2013-04-06 MED ORDER — FUROSEMIDE 10 MG/ML IJ SOLN
40.0000 mg | Freq: Once | INTRAMUSCULAR | Status: AC
  Administered 2013-04-06: 35 mg via INTRAVENOUS
  Filled 2013-04-06: qty 4

## 2013-04-06 MED ORDER — TECHNETIUM TC 99M MERTIATIDE
15.8000 | Freq: Once | INTRAVENOUS | Status: AC | PRN
  Administered 2013-04-06: 16 via INTRAVENOUS

## 2013-04-23 ENCOUNTER — Non-Acute Institutional Stay: Payer: Medicare Other | Admitting: Nurse Practitioner

## 2013-04-23 ENCOUNTER — Encounter: Payer: Self-pay | Admitting: Nurse Practitioner

## 2013-04-23 VITALS — BP 110/72 | HR 68 | Ht 65.0 in | Wt 153.0 lb

## 2013-04-23 DIAGNOSIS — M545 Low back pain: Secondary | ICD-10-CM

## 2013-04-23 DIAGNOSIS — M25569 Pain in unspecified knee: Secondary | ICD-10-CM | POA: Diagnosis not present

## 2013-04-23 DIAGNOSIS — N1330 Unspecified hydronephrosis: Secondary | ICD-10-CM | POA: Diagnosis not present

## 2013-04-23 DIAGNOSIS — G8929 Other chronic pain: Secondary | ICD-10-CM

## 2013-04-23 DIAGNOSIS — G4700 Insomnia, unspecified: Secondary | ICD-10-CM | POA: Diagnosis not present

## 2013-04-23 DIAGNOSIS — R413 Other amnesia: Secondary | ICD-10-CM | POA: Diagnosis not present

## 2013-04-23 DIAGNOSIS — K269 Duodenal ulcer, unspecified as acute or chronic, without hemorrhage or perforation: Secondary | ICD-10-CM

## 2013-04-23 DIAGNOSIS — N200 Calculus of kidney: Secondary | ICD-10-CM

## 2013-04-25 DIAGNOSIS — N200 Calculus of kidney: Secondary | ICD-10-CM | POA: Insufficient documentation

## 2013-04-25 DIAGNOSIS — N1330 Unspecified hydronephrosis: Secondary | ICD-10-CM | POA: Insufficient documentation

## 2013-04-25 DIAGNOSIS — M25562 Pain in left knee: Secondary | ICD-10-CM | POA: Insufficient documentation

## 2013-04-25 NOTE — Assessment & Plan Note (Signed)
CT abd/pelvis 7/15/1Apparent focal erosion involving the anterior wall of the distal gastric antrum, which may reflect a focal gastric ulceration. No free air seen to suggest significant perforation. A large amount of associated soft tissue inflammation is noted about the antrum of the stomach and pylorus, extending to the adjacent gallbladder and pancreatic head. Better since  Pantoprazole 40mg bid and Sucralfate 1gm qid.    

## 2013-04-25 NOTE — Assessment & Plan Note (Signed)
CT abd/pelvis 03/10/13     Apparent mild chronic right-sided hydronephrosis, new from prior renal ultrasound; no distal obstructing stone seen. This may reflect an underlying stricture at the right ureteropelvic

## 2013-04-25 NOTE — Progress Notes (Signed)
Patient ID: Janet Hernandez, female   DOB: October 25, 1929, 80 y.o.   MRN: [id number] Code Status: Living Will  No Known Allergies  Chief Complaint  Patient presents with  . Knee Pain    left knee pain for months, hurts all the time, worse at night. 11/2011 had knee replacement    HPI: Patient is a 79 y.o. female seen in the clinic at [facility] today for left knee pain and other chronic medical conditions.  Problem List Items Addressed This Visit   Chronic pain of left knee - Primary     Left knee pain worse-all the time and woke her up at night. S/p total knee replacement. Had NTG patch to her knee prescribed by Ortho pedis 10/2012 not been used--the patient desires to try and also to follow up with Ortho pedis. Appointment with Dr. Shelle Iron 04/28/13. Also takes Hydrocodone/APAP 7.5/325mg  bid and prn 1-2 q4hr available to her. Skeleaxin 800mg  daily.     Duodenal ulcer without hemorrhage or perforation     CT abd/pelvis 7/15/1Apparent focal erosion involving the anterior wall of the distal gastric antrum, which may reflect a focal gastric ulceration. No free air seen to suggest significant perforation. A large amount of associated soft tissue inflammation is noted about the antrum of the stomach and pylorus, extending to the adjacent gallbladder and pancreatic head. Better since  Pantoprazole 40mg  bid and Sucralfate 1gm qid.      Hydronephrosis of right kidney     CT abd/pelvis 03/10/13     Apparent mild chronic right-sided hydronephrosis, new from prior renal ultrasound; no distal obstructing stone seen. This may reflect an underlying stricture at the right ureteropelvic      Insomnia     Sleeps well except the left knee interfering her sleep recently.      Kidney stone on left side     2 mm nonobstructing stone near the upper pole of the left kidney; small bilateral renal cysts seen. CT abd/pelvis 03/10/13     Lumbago     Relatively stable grade 2 anterolisthesis of L4 on L5,  reflecting chronic bilateral pars defects at L4. Associated pedicle screws noted at L5.      Memory loss     Still able to function independently        Review of Systems:  Review of Systems  Constitutional: Negative for fever, chills, weight loss, malaise/fatigue and diaphoresis.  HENT: Positive for hearing loss (mild). Negative for ear pain, congestion, sore throat, neck pain and ear discharge.   Eyes: Negative for pain, discharge and redness.  Respiratory: Negative for cough, sputum production, shortness of breath and wheezing.   Cardiovascular: Negative for palpitations, orthopnea, claudication, leg swelling and PND.  Gastrointestinal: Negative for heartburn, nausea, vomiting, abdominal pain, diarrhea, constipation and blood in stool.  Genitourinary: Positive for frequency. Negative for dysuria, urgency and flank pain.  Musculoskeletal: Positive for joint pain. Negative for myalgias, back pain and falls.       Left knee pain  Skin: Negative for itching and rash.  Neurological: Negative for dizziness, tingling, tremors, sensory change, focal weakness, seizures, loss of consciousness, weakness and headaches.  Endo/Heme/Allergies: Negative for environmental allergies and polydipsia. Does not bruise/bleed easily.  Psychiatric/Behavioral: Positive for memory loss. Negative for depression and hallucinations. The patient is nervous/anxious. The patient does not have insomnia.      Past Medical History  Diagnosis Date  . Arthritis   . Rash     inner legs- ? med  allergy- to see PCP if doesnt improve by end of week  . Overactive bladder   . Myopathy, unspecified   . Benign paroxysmal positional vertigo   . Right bundle branch block   . Unspecified tinnitus   . Osteoarthrosis, unspecified whether generalized or localized, lower leg   . Spinal stenosis, lumbar region, without neurogenic claudication   . Lumbago 07/01/2009  . Unspecified urinary incontinence   . Urgency of urination    . Other abnormal glucose 03/23/2010  . Knee pain, left anterior   . Other disorders of bone and cartilage(733.99) 09/19/2012  . Urinary frequency 09/18/2012  . Other chest pain 08/28/2012  . Lump or mass in breast 08/01/2012  . Memory loss 01/22/2012  . Candidiasis 01/01/2012  . Unspecified constipation 12/18/2011  . Seborrheic keratoses 05/17/2011  . Myalgia and myositis, unspecified 05/17/2011  . Spasm of muscle 01/11/2011  . Cramp of limb 01/11/2011  . Disorder of bone and cartilage, unspecified 11/10/2010  . Dizziness and giddiness 11/10/2010  . Anemia, unspecified 09/07/2010  . Insomnia, unspecified 09/07/2010  . Allergic rhinitis due to pollen 07/01/2009   Past Surgical History  Procedure Laterality Date  . Back surgery      lumbar-- ? type  Dr Shelle Iron  . Eye surgery      bilateral cataract extraction with IOL  . Abdominal hysterectomy    . Cleft lip repair      with repair palate  . Breast surgery      right lumpectomy  . Total knee arthroplasty  12/13/2011    Procedure: TOTAL KNEE ARTHROPLASTY;  Surgeon: Javier Docker, MD;  Location: [facility];  Service: Orthopedics;  Laterality: Left;  . Breast lumpectomy  1993    breast cyst  . Appendectomy  1956  . Esophagogastroduodenoscopy Left 03/10/2013    Procedure: ESOPHAGOGASTRODUODENOSCOPY (EGD);  Surgeon: Theda Belfast, MD;  Location: [facility];  Service: Endoscopy;  Laterality: Left;   Social History:   reports that she has never smoked. She has never used smokeless tobacco. She reports that she does not drink alcohol or use illicit drugs.  History reviewed. No pertinent family history.  Medications: Patient's Medications  New Prescriptions   No medications on file  Previous Medications   BISACODYL (DULCOLAX) 5 MG EC TABLET    Take 5 mg by mouth. Take one daily for constipation   GLUCOSAMINE HCL 1500 MG TABS    Take 1,500 mg by mouth daily.   HYDROCODONE-ACETAMINOPHEN (NORCO) 7.5-325 MG PER TABLET    Take one tablet twice daily.  One every 4 hours as needed for pain; take 2 tablets every 4 hours as needed for severe pain   MECLIZINE (ANTIVERT) 25 MG TABLET    Take 25 mg by mouth 3 (three) times daily as needed. Vertigo    METAXALONE (SKELAXIN) 800 MG TABLET    Take 800 mg by mouth daily.   PANTOPRAZOLE (PROTONIX) 40 MG TABLET    Take 1 tablet (40 mg total) by mouth 2 (two) times daily.  Modified Medications   Modified Medication Previous Medication   SUCRALFATE (CARAFATE) 1 G TABLET sucralfate (CARAFATE) 1 G tablet      Take 1 g by mouth. Take three times daily    Take 1 tablet (1 g total) by mouth 4 (four) times daily -  with meals and at bedtime.  Discontinued Medications   OXYBUTYNIN CHLORIDE (GELNIQUE) 10 % GEL    Place onto the skin. Apple one pack to thigh daily  Physical Exam: Physical Exam  Constitutional: She is oriented to person, place, and time. She appears well-developed and well-nourished. No distress.  Neck: No JVD present. No tracheal deviation present. No thyromegaly present.  Cardiovascular: Normal rate, regular rhythm and normal heart sounds.  Exam reveals no gallop and no friction rub.   No murmur heard. Pulmonary/Chest: Breath sounds normal. No respiratory distress.  Abdominal: Soft. Bowel sounds are normal. She exhibits no distension and no mass. There is no tenderness. There is no guarding.  Musculoskeletal: She exhibits tenderness. She exhibits no edema.  Pain and swelling at the left knee. Old surgical scar at the left knee.  Lymphadenopathy:    She has no cervical adenopathy.  Neurological: She is alert and oriented to person, place, and time. No cranial nerve deficit.  Skin: No rash noted. No erythema. No pallor.  Psychiatric: She has a normal mood and affect. Her behavior is normal. Judgment and thought content normal.    Filed Vitals:   04/23/13 1509  BP: 110/72  Pulse: 68  Height: 5\' 5"  (1.651 m)  Weight: 153 lb (69.4 kg)      Labs reviewed: Basic Metabolic  Panel:  Recent Labs  03/10/13 0405 03/10/13 0413 03/11/13 0446  NA 138 140 142  K 4.2 4.1 3.9  CL 104 106 109  CO2 23  --  23  GLUCOSE 122* 119* 106*  BUN 22 23 11   CREATININE 0.64 0.70 0.69  CALCIUM 9.5  --  9.4   Liver Function Tests:  Recent Labs  03/10/13 0405  AST 27  ALT 23  ALKPHOS 140*  BILITOT 0.2*  PROT 7.2  ALBUMIN 3.4*    Recent Labs  03/10/13 0405  LIPASE 22   CBC:  Recent Labs  03/10/13 0405 03/10/13 0413 03/11/13 0446  WBC 8.9  --  6.2  NEUTROABS 6.9  --   --   HGB 12.6 13.6 11.9*  HCT 39.0 40.0 38.0  MCV 77.4*  --  77.7*  PLT 275  --  235   Past Procedures:  09/23/12 US renal:   IMPRESSION:  Normal renal ultrasound. Small benign appearing bilateral renal  cyst.  03/10/13 CT abd  IMPRESSION:  1. Apparent focal erosion involving the anterior wall of the  distal gastric antrum, which may reflect a focal gastric  ulceration. No free air seen to suggest significant perforation.  A large amount of associated soft tissue inflammation is noted  about the antrum of the stomach and pylorus, extending to the  adjacent gallbladder and pancreatic head.  2. Apparent mild chronic right-sided hydronephrosis, new from  prior renal ultrasound; no distal obstructing stone seen. This may  reflect an underlying stricture at the right ureteropelvic  junction, though not definitely characterized on delayed images.  3. 2 mm nonobstructing stone near the upper pole of the left  kidney; small bilateral renal cysts seen.  4. Mild scattered calcification along the distal abdominal aorta  and its branches.  5. Relatively stable grade 2 anterolisthesis of L4 on L5,  reflecting chronic bilateral pars defects at L4. Associated  pedicle screws noted at L5.  6. Mild bibasilar atelectasis or scarring noted.   Recent Labs  03/10/13 0405 03/10/13 0413 03/11/13 0446  WBC 8.9  --  6.2  NEUTROABS 6.9  --   --   HGB 12.6 13.6 11.9*  HCT 39.0 40.0 38.0  MCV  77.4*  --  77.7*  PLT 275  --  235     Recent Labs  03/10/13 0405 03/10/13 0413 03/11/13 0446  WBC 8.9  --  6.2  NEUTROABS 6.9  --   --   HGB 12.6 13.6 11.9*  HCT 39.0 40.0 38.0  MCV 77.4*  --  77.7*  PLT 275  --  235     Assessment/Plan Chronic pain of left knee Left knee pain worse-all the time and woke her up at night. S/p total knee replacement. Had NTG patch to her knee prescribed by Ortho pedis 10/2012 not been used--the patient desires to try and also to follow up with Ortho pedis. Appointment with Dr. Shelle Iron 04/28/13. Also takes Hydrocodone/APAP 7.5/325mg  bid and prn 1-2 q4hr available to her. Skeleaxin 800mg  daily.   Memory loss Still able to function independently   Insomnia Sleeps well except the left knee interfering her sleep recently.    Hydronephrosis of right kidney CT abd/pelvis 03/10/13     Apparent mild chronic right-sided hydronephrosis, new from prior renal ultrasound; no distal obstructing stone seen. This may reflect an underlying stricture at the right ureteropelvic    Kidney stone on left side 2 mm nonobstructing stone near the upper pole of the left kidney; small bilateral renal cysts seen. CT abd/pelvis 03/10/13   Duodenal ulcer without hemorrhage or perforation CT abd/pelvis 7/15/1Apparent focal erosion involving the anterior wall of the distal gastric antrum, which may reflect a focal gastric ulceration. No free air seen to suggest significant perforation. A large amount of associated soft tissue inflammation is noted about the antrum of the stomach and pylorus, extending to the adjacent gallbladder and pancreatic head. Better since  Pantoprazole 40mg  bid and Sucralfate 1gm qid.    Lumbago Relatively stable grade 2 anterolisthesis of L4 on L5, reflecting chronic bilateral pars defects at L4. Associated pedicle screws noted at L5.      Family/ Staff Communication: observe the patient.   Goals of Care: IL  Labs/tests ordered: none

## 2013-04-25 NOTE — Assessment & Plan Note (Addendum)
Left knee pain worse-all the time and woke her up at night. S/p total knee replacement. Had NTG patch to her knee prescribed by Ortho pedis 10/2012 not been used--the patient desires to try and also to follow up with Ortho pedis. Appointment with Dr. Shelle Iron 04/28/13. Also takes Hydrocodone/APAP 7.5/325mg  bid and prn 1-2 q4hr available to her. Skeleaxin 800mg  daily.

## 2013-04-25 NOTE — Assessment & Plan Note (Signed)
Relatively stable grade 2 anterolisthesis of L4 on L5, reflecting chronic bilateral pars defects at L4. Associated pedicle screws noted at L5.    

## 2013-04-25 NOTE — Assessment & Plan Note (Signed)
Still able to function independently   

## 2013-04-25 NOTE — Assessment & Plan Note (Signed)
2 mm nonobstructing stone near the upper pole of the left kidney; small bilateral renal cysts seen. CT abd/pelvis 03/10/13

## 2013-04-25 NOTE — Assessment & Plan Note (Signed)
Sleeps well except the left knee interfering her sleep recently.    

## 2013-04-28 DIAGNOSIS — M5137 Other intervertebral disc degeneration, lumbosacral region: Secondary | ICD-10-CM | POA: Diagnosis not present

## 2013-04-28 DIAGNOSIS — M7650 Patellar tendinitis, unspecified knee: Secondary | ICD-10-CM | POA: Diagnosis not present

## 2013-04-28 DIAGNOSIS — M25569 Pain in unspecified knee: Secondary | ICD-10-CM | POA: Diagnosis not present

## 2013-06-10 DIAGNOSIS — Z23 Encounter for immunization: Secondary | ICD-10-CM | POA: Diagnosis not present

## 2013-06-12 DIAGNOSIS — R32 Unspecified urinary incontinence: Secondary | ICD-10-CM | POA: Diagnosis not present

## 2013-06-12 DIAGNOSIS — R351 Nocturia: Secondary | ICD-10-CM | POA: Diagnosis not present

## 2013-06-12 DIAGNOSIS — R350 Frequency of micturition: Secondary | ICD-10-CM | POA: Diagnosis not present

## 2013-06-16 DIAGNOSIS — Z79899 Other long term (current) drug therapy: Secondary | ICD-10-CM | POA: Diagnosis not present

## 2013-06-16 DIAGNOSIS — H8110 Benign paroxysmal vertigo, unspecified ear: Secondary | ICD-10-CM | POA: Diagnosis not present

## 2013-06-16 DIAGNOSIS — D649 Anemia, unspecified: Secondary | ICD-10-CM | POA: Diagnosis not present

## 2013-06-25 ENCOUNTER — Encounter: Payer: Self-pay | Admitting: Internal Medicine

## 2013-06-25 ENCOUNTER — Non-Acute Institutional Stay: Payer: Medicare Other | Admitting: Internal Medicine

## 2013-06-25 VITALS — BP 122/76 | HR 82 | Ht 65.0 in | Wt 157.0 lb

## 2013-06-25 DIAGNOSIS — M25569 Pain in unspecified knee: Secondary | ICD-10-CM

## 2013-06-25 DIAGNOSIS — H8110 Benign paroxysmal vertigo, unspecified ear: Secondary | ICD-10-CM | POA: Diagnosis not present

## 2013-06-25 DIAGNOSIS — M545 Low back pain, unspecified: Secondary | ICD-10-CM

## 2013-06-25 DIAGNOSIS — R413 Other amnesia: Secondary | ICD-10-CM | POA: Diagnosis not present

## 2013-06-25 DIAGNOSIS — M1710 Unilateral primary osteoarthritis, unspecified knee: Secondary | ICD-10-CM

## 2013-06-25 DIAGNOSIS — D649 Anemia, unspecified: Secondary | ICD-10-CM

## 2013-06-25 DIAGNOSIS — M1711 Unilateral primary osteoarthritis, right knee: Secondary | ICD-10-CM

## 2013-06-25 DIAGNOSIS — G8929 Other chronic pain: Secondary | ICD-10-CM

## 2013-06-25 NOTE — Patient Instructions (Signed)
Try Aspercream or Myoflex 4 times daily to the left knee. Add SlowFe to help your mild anemia.

## 2013-06-25 NOTE — Progress Notes (Signed)
Subjective:    Patient ID: Janet Hernandez, female    DOB: 1930/06/06, 79 y.o.   MRN: [id number]  Chief Complaint  Patient presents with  . Medical Managment of Chronic Issues    knee pain, memory, vertigo    HPI Chronic pain of left knee: Surgery a year ago.Previously used Voltaren cream.  Osteoarthritis of right knee: painless  Memory loss: unchanged  Benign paroxysmal positional vertigo: resolved  Lumbago: improved  Anemia.: unchanged except for low MCV of 77   Current Outpatient Prescriptions on File Prior to Visit  Medication Sig Dispense Refill  . bisacodyl (DULCOLAX) 5 MG EC tablet Take 5 mg by mouth. Take one daily for constipation      . Glucosamine HCl 1500 MG TABS Take 1,500 mg by mouth daily.      Marland Kitchen HYDROcodone-acetaminophen (NORCO) 7.5-325 MG per tablet Take one tablet twice daily. One every 4 hours as needed for pain; take 2 tablets every 4 hours as needed for severe pain      . meclizine (ANTIVERT) 25 MG tablet Take 25 mg by mouth 3 (three) times daily as needed. Vertigo       . metaxalone (SKELAXIN) 800 MG tablet Take 800 mg by mouth daily.      . pantoprazole (PROTONIX) 40 MG tablet Take 1 tablet (40 mg total) by mouth 2 (two) times daily.  60 tablet  0  . sucralfate (CARAFATE) 1 G tablet Take 1 g by mouth. Take three times daily       No current facility-administered medications on file prior to visit.    Review of Systems  Constitutional: Negative.  Negative for fever.  Eyes: Negative.   Respiratory: Negative.   Cardiovascular: Negative.   Musculoskeletal: Positive for arthralgias and joint swelling.       Pain and swelling at the left knee. Not inflamed or hot.  Skin: Negative.        Objective:BP 122/76  Pulse 82  Ht 5\' 5"  (1.651 m)  Wt 157 lb (71.215 kg)  BMI 26.13 kg/m2    Physical Exam  Constitutional: She is oriented to person, place, and time. She appears well-developed and well-nourished. She appears distressed.  Neck: No JVD  present. No tracheal deviation present. No thyromegaly present.  Cardiovascular: Normal rate, regular rhythm and normal heart sounds.  Exam reveals no gallop and no friction rub.   No murmur heard. Pulmonary/Chest: Breath sounds normal. No respiratory distress.  Abdominal: Soft. Bowel sounds are normal. She exhibits no distension and no mass. There is no tenderness. There is no guarding.  Musculoskeletal: She exhibits tenderness (left knee anteriorly and posteriorly. No effusion.). She exhibits no edema.  Pain and swelling at the left knee. Old surgical scar at the left knee.  Lymphadenopathy:    She has no cervical adenopathy.  Neurological: She is alert and oriented to person, place, and time. No cranial nerve deficit.  Skin: No rash noted. No erythema. No pallor.  Psychiatric: She has a normal mood and affect. Her behavior is normal. Judgment and thought content normal.      LAB REVIEW 06/16/13 CBC; HGB 11.6, MCCV 77  BMP: normal    Assessment & Plan:  Chronic pain of left knee: try Myoflex or Aspercream 4 times daily. If this does not work, she will need to see Dr. Shelle Iron again.  Osteoarthritis of right knee: asymptomatic at this time  Memory loss: unchanged  Benign paroxysmal positional vertigo: improved  Lumbago: improved  Anemia, unspecified: try  SlowFe 1 daily

## 2013-06-30 ENCOUNTER — Encounter: Payer: Self-pay | Admitting: Internal Medicine

## 2013-08-13 DIAGNOSIS — R350 Frequency of micturition: Secondary | ICD-10-CM | POA: Diagnosis not present

## 2013-08-13 DIAGNOSIS — R32 Unspecified urinary incontinence: Secondary | ICD-10-CM | POA: Diagnosis not present

## 2013-08-13 DIAGNOSIS — R82998 Other abnormal findings in urine: Secondary | ICD-10-CM | POA: Diagnosis not present

## 2013-08-13 DIAGNOSIS — R351 Nocturia: Secondary | ICD-10-CM | POA: Diagnosis not present

## 2013-09-03 DIAGNOSIS — R279 Unspecified lack of coordination: Secondary | ICD-10-CM | POA: Diagnosis not present

## 2013-09-03 DIAGNOSIS — R339 Retention of urine, unspecified: Secondary | ICD-10-CM | POA: Diagnosis not present

## 2013-09-03 DIAGNOSIS — N3941 Urge incontinence: Secondary | ICD-10-CM | POA: Diagnosis not present

## 2013-09-03 DIAGNOSIS — M6281 Muscle weakness (generalized): Secondary | ICD-10-CM | POA: Diagnosis not present

## 2013-09-18 DIAGNOSIS — M6281 Muscle weakness (generalized): Secondary | ICD-10-CM | POA: Diagnosis not present

## 2013-09-18 DIAGNOSIS — N3946 Mixed incontinence: Secondary | ICD-10-CM | POA: Diagnosis not present

## 2013-09-22 DIAGNOSIS — N3946 Mixed incontinence: Secondary | ICD-10-CM | POA: Diagnosis not present

## 2013-09-22 DIAGNOSIS — D649 Anemia, unspecified: Secondary | ICD-10-CM | POA: Diagnosis not present

## 2013-09-22 DIAGNOSIS — M6281 Muscle weakness (generalized): Secondary | ICD-10-CM | POA: Diagnosis not present

## 2013-09-22 LAB — CBC AND DIFFERENTIAL
HCT: 42 % (ref 36–46)
HEMOGLOBIN: 14.4 g/dL (ref 12.0–16.0)
PLATELETS: 207 10*3/uL (ref 150–399)
WBC: 6.4 10*3/mL

## 2013-09-25 DIAGNOSIS — M6281 Muscle weakness (generalized): Secondary | ICD-10-CM | POA: Diagnosis not present

## 2013-09-25 DIAGNOSIS — N3946 Mixed incontinence: Secondary | ICD-10-CM | POA: Diagnosis not present

## 2013-09-29 DIAGNOSIS — M6281 Muscle weakness (generalized): Secondary | ICD-10-CM | POA: Diagnosis not present

## 2013-09-29 DIAGNOSIS — N3946 Mixed incontinence: Secondary | ICD-10-CM | POA: Diagnosis not present

## 2013-10-01 ENCOUNTER — Encounter: Payer: Self-pay | Admitting: Internal Medicine

## 2013-10-01 ENCOUNTER — Non-Acute Institutional Stay: Payer: Medicare Other | Admitting: Internal Medicine

## 2013-10-01 VITALS — BP 116/80 | HR 84 | Wt 156.0 lb

## 2013-10-01 DIAGNOSIS — G8929 Other chronic pain: Secondary | ICD-10-CM

## 2013-10-01 DIAGNOSIS — M25569 Pain in unspecified knee: Secondary | ICD-10-CM | POA: Diagnosis not present

## 2013-10-01 DIAGNOSIS — D649 Anemia, unspecified: Secondary | ICD-10-CM | POA: Diagnosis not present

## 2013-10-01 DIAGNOSIS — M25562 Pain in left knee: Secondary | ICD-10-CM

## 2013-10-01 NOTE — Patient Instructions (Signed)
Use Aspercreme 4 times daily to painful knee. See Dr. Tonita Cong again about your painful knee. Stop the iron tablet.

## 2013-10-01 NOTE — Progress Notes (Signed)
Patient ID: Janet Hernandez, female   DOB: December 07, 1929, 80 y.o.   MRN: [id number]    Location:  [facility] (12)    No Known Allergies  Chief Complaint  Patient presents with  . Medical Managment of Chronic Issues    chronic knee pain, anemia, memory     HPI:  Anemia, unspecified: resolved on additional iron.  Chronic pain of left knee: continues with pain despite her knee surgery. She would like to see Dr. Tonita Cong again.    Medications: Patient's Medications  New Prescriptions   No medications on file  Previous Medications   BISACODYL (DULCOLAX) 5 MG EC TABLET    Take 5 mg by mouth. Take one daily for constipation   FERROUS SULFATE 140 (45 FE) MG TBCR    Take by mouth. Take one tablet daily   GLUCOSAMINE HCL 1500 MG TABS    Take 1,500 mg by mouth daily.   HYDROCODONE-ACETAMINOPHEN (NORCO) 7.5-325 MG PER TABLET    Take one tablet twice daily. One every 4 hours as needed for pain; take 2 tablets every 4 hours as needed for severe pain   MECLIZINE (ANTIVERT) 25 MG TABLET    Take 25 mg by mouth 3 (three) times daily as needed. Vertigo    METAXALONE (SKELAXIN) 800 MG TABLET    Take 800 mg by mouth daily.   OXYBUTYNIN (DITROPAN-XL) 5 MG 24 HR TABLET    Take one tablet daily for bladder   OXYBUTYNIN CHLORIDE (GELNIQUE) 10 % GEL    Place onto the skin. Apply one packet daily for bladder   PANTOPRAZOLE (PROTONIX) 40 MG TABLET    Take 1 tablet (40 mg total) by mouth 2 (two) times daily.   SUCRALFATE (CARAFATE) 1 G TABLET    Take 1 g by mouth. Take three times daily  Modified Medications   No medications on file  Discontinued Medications   No medications on file     Review of Systems  Constitutional: Negative.  Negative for fever.  HENT: Negative.   Eyes: Negative.   Respiratory: Negative.   Cardiovascular: Negative.   Endocrine: Negative.   Genitourinary: Negative.   Musculoskeletal: Positive for arthralgias and joint swelling.   Pain and swelling at the left knee. Not inflamed or hot.  Skin: Negative.   Neurological: Negative.   Hematological:       Hx anemia  Psychiatric/Behavioral: Negative.     Filed Vitals:   10/01/13 1607  BP: 116/80  Pulse: 84  Weight: 156 lb (70.761 kg)   Physical Exam  Constitutional: She is oriented to person, place, and time. She appears well-developed and well-nourished. She appears distressed.  Neck: No JVD present. No tracheal deviation present. No thyromegaly present.  Cardiovascular: Normal rate, regular rhythm and normal heart sounds.  Exam reveals no gallop and no friction rub.   No murmur heard. Pulmonary/Chest: Breath sounds normal. No respiratory distress.  Abdominal: Soft. Bowel sounds are normal. She exhibits no distension and no mass. There is no tenderness. There is no guarding.  Musculoskeletal: She exhibits tenderness (left knee anteriorly and posteriorly. No effusion.). She exhibits no edema.  Pain and swelling at the left knee. Old surgical scar at the left knee.  Lymphadenopathy:    She has no cervical adenopathy.  Neurological: She is alert and oriented to person, place, and time. No cranial nerve deficit.  Skin: No rash noted. No erythema. No pallor.  Psychiatric: She has a normal mood  and affect. Her behavior is normal. Judgment and thought content normal.     Labs reviewed:  06/16/13 CBC; HGB 11.6, MCCV 77  BMP: normal  Nursing Home on 10/01/2013  Component Date Value Range Status  . Hemoglobin 09/22/2013 14.4  12.0 - 16.0 g/dL Final  . HCT 09/22/2013 42  36 - 46 % Final  . Platelets 09/22/2013 207  150 - 399 K/L Final  . WBC 09/22/2013 6.4   Final      Assessment/Plan  Anemia, unspecified: stop ferrous sulfate  Chronic pain of left knee: referred to Dr. Tonita Cong. Add Aspercream at meals and bed to medications

## 2013-10-02 ENCOUNTER — Telehealth: Payer: Self-pay

## 2013-10-02 NOTE — Telephone Encounter (Signed)
appt with Dr. Maxie Better 10/06/13 at 2:00 for left knee pain. Called patient left message on her recorder.

## 2013-10-06 DIAGNOSIS — N3946 Mixed incontinence: Secondary | ICD-10-CM | POA: Diagnosis not present

## 2013-10-06 DIAGNOSIS — M25569 Pain in unspecified knee: Secondary | ICD-10-CM | POA: Diagnosis not present

## 2013-10-06 DIAGNOSIS — M6281 Muscle weakness (generalized): Secondary | ICD-10-CM | POA: Diagnosis not present

## 2013-10-09 DIAGNOSIS — N3946 Mixed incontinence: Secondary | ICD-10-CM | POA: Diagnosis not present

## 2013-10-09 DIAGNOSIS — M6281 Muscle weakness (generalized): Secondary | ICD-10-CM | POA: Diagnosis not present

## 2013-10-14 DIAGNOSIS — M6281 Muscle weakness (generalized): Secondary | ICD-10-CM | POA: Diagnosis not present

## 2013-10-14 DIAGNOSIS — N3946 Mixed incontinence: Secondary | ICD-10-CM | POA: Diagnosis not present

## 2013-10-16 DIAGNOSIS — M6281 Muscle weakness (generalized): Secondary | ICD-10-CM | POA: Diagnosis not present

## 2013-10-16 DIAGNOSIS — N3946 Mixed incontinence: Secondary | ICD-10-CM | POA: Diagnosis not present

## 2013-10-20 DIAGNOSIS — M6281 Muscle weakness (generalized): Secondary | ICD-10-CM | POA: Diagnosis not present

## 2013-10-20 DIAGNOSIS — N3946 Mixed incontinence: Secondary | ICD-10-CM | POA: Diagnosis not present

## 2013-10-23 DIAGNOSIS — N3946 Mixed incontinence: Secondary | ICD-10-CM | POA: Diagnosis not present

## 2013-10-23 DIAGNOSIS — M6281 Muscle weakness (generalized): Secondary | ICD-10-CM | POA: Diagnosis not present

## 2013-10-28 DIAGNOSIS — M6281 Muscle weakness (generalized): Secondary | ICD-10-CM | POA: Diagnosis not present

## 2013-10-28 DIAGNOSIS — N3946 Mixed incontinence: Secondary | ICD-10-CM | POA: Diagnosis not present

## 2013-10-30 DIAGNOSIS — N3946 Mixed incontinence: Secondary | ICD-10-CM | POA: Diagnosis not present

## 2013-10-30 DIAGNOSIS — M6281 Muscle weakness (generalized): Secondary | ICD-10-CM | POA: Diagnosis not present

## 2013-11-04 DIAGNOSIS — N3946 Mixed incontinence: Secondary | ICD-10-CM | POA: Diagnosis not present

## 2013-11-04 DIAGNOSIS — M6281 Muscle weakness (generalized): Secondary | ICD-10-CM | POA: Diagnosis not present

## 2013-11-06 DIAGNOSIS — M6281 Muscle weakness (generalized): Secondary | ICD-10-CM | POA: Diagnosis not present

## 2013-11-06 DIAGNOSIS — N3946 Mixed incontinence: Secondary | ICD-10-CM | POA: Diagnosis not present

## 2013-12-15 DIAGNOSIS — R350 Frequency of micturition: Secondary | ICD-10-CM | POA: Diagnosis not present

## 2013-12-15 DIAGNOSIS — R351 Nocturia: Secondary | ICD-10-CM | POA: Diagnosis not present

## 2013-12-15 DIAGNOSIS — N3941 Urge incontinence: Secondary | ICD-10-CM | POA: Diagnosis not present

## 2014-01-14 DIAGNOSIS — R7309 Other abnormal glucose: Secondary | ICD-10-CM | POA: Diagnosis not present

## 2014-01-14 DIAGNOSIS — D649 Anemia, unspecified: Secondary | ICD-10-CM | POA: Diagnosis not present

## 2014-01-14 LAB — BASIC METABOLIC PANEL
BUN: 18 mg/dL (ref 4–21)
Creatinine: 0.8 mg/dL (ref 0.5–1.1)
Glucose: 99 mg/dL
POTASSIUM: 4.5 mmol/L (ref 3.4–5.3)
Sodium: 141 mmol/L (ref 137–147)

## 2014-01-14 LAB — HEPATIC FUNCTION PANEL
ALK PHOS: 77 U/L (ref 25–125)
ALT: 10 U/L (ref 7–35)
AST: 15 U/L (ref 13–35)
Bilirubin, Total: 0.5 mg/dL

## 2014-01-14 LAB — CBC AND DIFFERENTIAL
HCT: 36 % (ref 36–46)
HEMOGLOBIN: 12.3 g/dL (ref 12.0–16.0)
Platelets: 257 10*3/uL (ref 150–399)
WBC: 5.2 10*3/mL

## 2014-01-21 ENCOUNTER — Non-Acute Institutional Stay: Payer: Medicare Other | Admitting: Internal Medicine

## 2014-01-21 ENCOUNTER — Encounter: Payer: Self-pay | Admitting: Internal Medicine

## 2014-01-21 VITALS — BP 112/72 | HR 68 | Wt 158.0 lb

## 2014-01-21 DIAGNOSIS — R413 Other amnesia: Secondary | ICD-10-CM

## 2014-01-21 DIAGNOSIS — M25562 Pain in left knee: Secondary | ICD-10-CM

## 2014-01-21 DIAGNOSIS — M25569 Pain in unspecified knee: Secondary | ICD-10-CM

## 2014-01-21 DIAGNOSIS — L989 Disorder of the skin and subcutaneous tissue, unspecified: Secondary | ICD-10-CM

## 2014-01-21 DIAGNOSIS — D649 Anemia, unspecified: Secondary | ICD-10-CM

## 2014-01-21 DIAGNOSIS — G8929 Other chronic pain: Secondary | ICD-10-CM

## 2014-01-21 MED ORDER — HYDROCORTISONE 1 % EX CREA: TOPICAL_CREAM | CUTANEOUS | Status: DC

## 2014-01-21 NOTE — Progress Notes (Signed)
Patient ID: Janet Hernandez, female   DOB: 04-17-1930, 80 y.o.   MRN: [id number]    Location:  [facility]  Place of Service: CLINIC   No Known Allergies  Chief Complaint  Patient presents with  . Medical Management of Chronic Issues    anemia, arthritis    HPI:  Lesion of left neck: small scaling papule of the left side of the neck. No pain or bleeding.  Anemia, unspecified: resolved  Chronic pain of left knee: unchanged  Memory loss: unchanged    Medications: Patient's Medications  New Prescriptions   No medications on file  Previous Medications   BISACODYL (DULCOLAX) 5 MG EC TABLET    Take 5 mg by mouth. Take one daily for constipation   GLUCOSAMINE HCL 1500 MG TABS    Take 1,500 mg by mouth daily.   HYDROCODONE-ACETAMINOPHEN (NORCO) 7.5-325 MG PER TABLET    Take one tablet twice daily. One every 4 hours as needed for pain; take 2 tablets every 4 hours as needed for severe pain   MECLIZINE (ANTIVERT) 25 MG TABLET    Take 25 mg by mouth 3 (three) times daily as needed. Vertigo    METAXALONE (SKELAXIN) 800 MG TABLET    Take 800 mg by mouth daily.   OXYBUTYNIN (DITROPAN-XL) 5 MG 24 HR TABLET    Take one tablet daily for bladder   PANTOPRAZOLE (PROTONIX) 40 MG TABLET    Take 1 tablet (40 mg total) by mouth 2 (two) times daily.   SUCRALFATE (CARAFATE) 1 G TABLET    Take 1 g by mouth. Take three times daily  Modified Medications   No medications on file  Discontinued Medications   OXYBUTYNIN CHLORIDE (GELNIQUE) 10 % GEL    Place onto the skin. Apply one packet daily for bladder     Review of Systems  Constitutional: Negative.  Negative for fever.  HENT: Negative.   Eyes: Negative.   Respiratory: Negative.   Cardiovascular: Negative.   Endocrine: Negative.   Genitourinary: Negative.   Musculoskeletal: Positive for arthralgias and joint swelling.       Pain and swelling at the left knee. Not inflamed or hot.  Skin:       New scaling papule of the left neck    Neurological:       Memory loss  Hematological:       Hx anemia  Psychiatric/Behavioral: Negative.     Filed Vitals:   01/21/14 1415  BP: 112/72  Pulse: 68  Weight: 158 lb (71.668 kg)   Body mass index is 26.29 kg/(m^2).  Physical Exam  Constitutional: She is oriented to person, place, and time. She appears well-developed and well-nourished. She appears distressed.  Neck: No JVD present. No tracheal deviation present. No thyromegaly present.  Cardiovascular: Normal rate, regular rhythm and normal heart sounds.  Exam reveals no gallop and no friction rub.   No murmur heard. Pulmonary/Chest: Breath sounds normal. No respiratory distress.  Abdominal: Soft. Bowel sounds are normal. She exhibits no distension and no mass. There is no tenderness. There is no guarding.  Musculoskeletal: She exhibits tenderness (left knee anteriorly and posteriorly. No effusion.). She exhibits no edema.  Pain and swelling at the left knee. Old surgical scar at the left knee.  Lymphadenopathy:    She has no cervical adenopathy.  Neurological: She is alert and oriented to person, place, and time. No cranial nerve deficit.  Memory loss  Skin: No rash noted. No erythema. No pallor.  3  mm lesion of the left neck inferior to pinna  Psychiatric: She has a normal mood and affect. Her behavior is normal. Judgment and thought content normal.     Labs reviewed: Nursing Home on 01/21/2014  Component Date Value Ref Range Status  . Hemoglobin 01/14/2014 12.3  12.0 - 16.0 g/dL Final  . HCT 01/14/2014 36  36 - 46 % Final  . Platelets 01/14/2014 257  150 - 399 K/L Final  . WBC 01/14/2014 5.2   Final  . Glucose 01/14/2014 99   Final  . BUN 01/14/2014 18  4 - 21 mg/dL Final  . Creatinine 01/14/2014 0.8  0.5 - 1.1 mg/dL Final  . Potassium 01/14/2014 4.5  3.4 - 5.3 mmol/L Final  . Sodium 01/14/2014 141  137 - 147 mmol/L Final  . Alkaline Phosphatase 01/14/2014 77  25 - 125 U/L Final  . ALT 01/14/2014 10  7 - 35  U/L Final  . AST 01/14/2014 15  13 - 35 U/L Final  . Bilirubin, Total 01/14/2014 0.5   Final      Assessment/Plan  1. Lesion of left neck - hydrocortisone cream 1 %; Apply twice daily to the lesion on the left neck  Dispense: 30 g; Refill: 0  2. Anemia, unspecified resolved  3. Chronic pain of left knee Unchanged  4. Memory loss MMSE next visit

## 2014-01-27 ENCOUNTER — Other Ambulatory Visit: Payer: Self-pay | Admitting: *Deleted

## 2014-01-27 MED ORDER — HYDROCODONE-ACETAMINOPHEN 7.5-325 MG PO TABS: ORAL_TABLET | ORAL | Status: DC

## 2014-01-27 NOTE — Telephone Encounter (Signed)
[facility] 

## 2014-02-16 DIAGNOSIS — M25569 Pain in unspecified knee: Secondary | ICD-10-CM | POA: Diagnosis not present

## 2014-04-04 IMAGING — CR DG KNEE 1-2V PORT*L*
1 series · 2 of 2 positions shown · non-contrast
Comparison: 12/04/2011.

CLINICAL DATA: Postop left knee arthroplasty.

PORTABLE LEFT KNEE - 1-2 VIEW

[Series 1: AP · left · 2 of 2 slices shown]
[im 1/2]
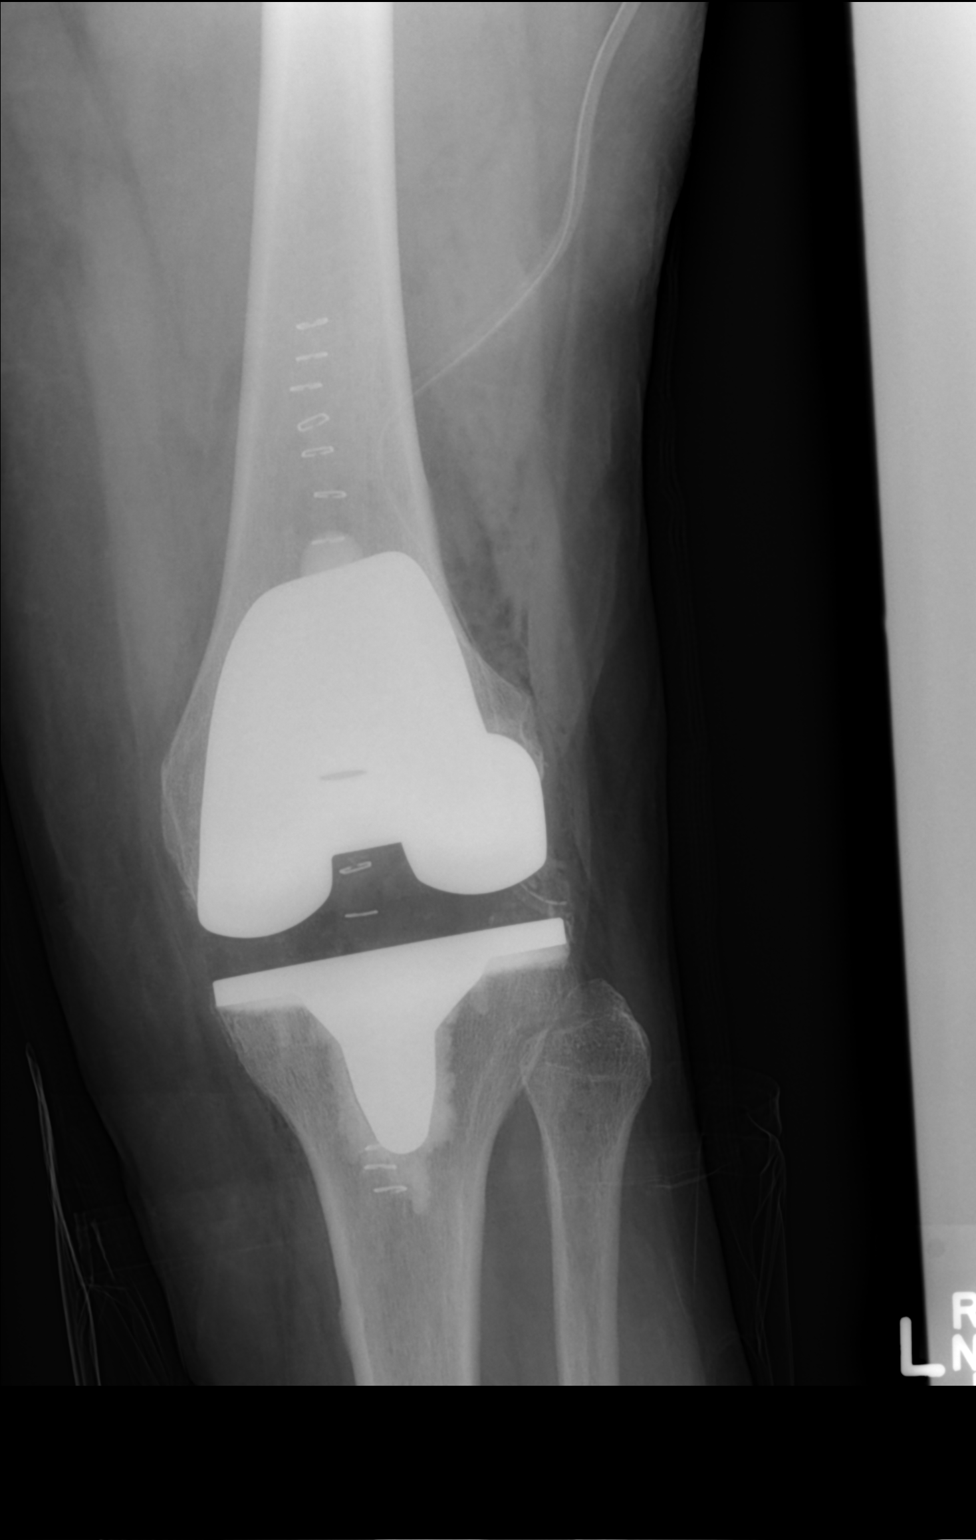
[im 2/2]
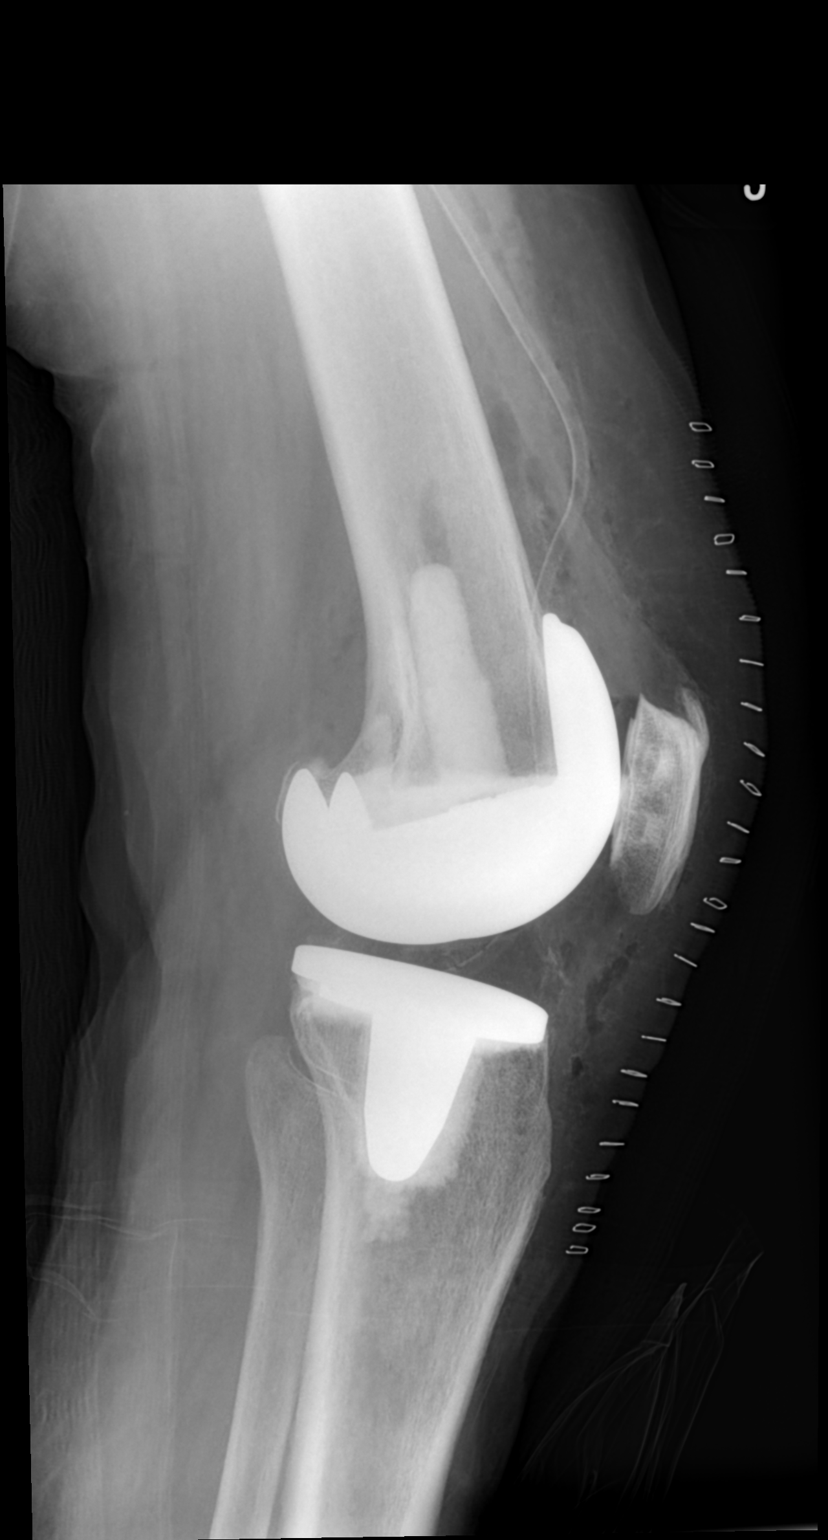

[2 of 2 positions shown; findings below may reference images not displayed]

FINDINGS: Patient is post left total knee arthroplasty.  Surgical
skin staples are in place.  Subcutaneous and joint air and fluid
are noted.  Components appear well seated.
IMPRESSION: Left total knee arthroplasty with expected postoperative findings.

## 2014-04-30 ENCOUNTER — Other Ambulatory Visit: Payer: Self-pay | Admitting: *Deleted

## 2014-04-30 MED ORDER — HYDROCODONE-ACETAMINOPHEN 7.5-325 MG PO TABS: ORAL_TABLET | ORAL | Status: DC

## 2014-04-30 NOTE — Telephone Encounter (Signed)
[facility] 

## 2014-06-02 ENCOUNTER — Other Ambulatory Visit: Payer: Self-pay | Admitting: *Deleted

## 2014-06-02 MED ORDER — HYDROCODONE-ACETAMINOPHEN 7.5-325 MG PO TABS: ORAL_TABLET | ORAL | Status: DC

## 2014-06-02 NOTE — Telephone Encounter (Signed)
[facility] 

## 2014-06-03 ENCOUNTER — Other Ambulatory Visit: Payer: Self-pay | Admitting: *Deleted

## 2014-06-03 MED ORDER — HYDROCODONE-ACETAMINOPHEN 7.5-325 MG PO TABS: ORAL_TABLET | ORAL | Status: DC

## 2014-06-03 NOTE — Telephone Encounter (Signed)
[facility] Fax Order 

## 2014-06-11 ENCOUNTER — Other Ambulatory Visit: Payer: Self-pay

## 2014-06-14 DIAGNOSIS — Z23 Encounter for immunization: Secondary | ICD-10-CM | POA: Diagnosis not present

## 2014-06-22 DIAGNOSIS — R350 Frequency of micturition: Secondary | ICD-10-CM | POA: Diagnosis not present

## 2014-06-22 DIAGNOSIS — N3941 Urge incontinence: Secondary | ICD-10-CM | POA: Diagnosis not present

## 2014-06-29 DIAGNOSIS — D649 Anemia, unspecified: Secondary | ICD-10-CM | POA: Diagnosis not present

## 2014-06-29 LAB — CBC AND DIFFERENTIAL
HEMATOCRIT: 35 % — AB (ref 36–46)
Hemoglobin: 11.1 g/dL — AB (ref 12.0–16.0)
Platelets: 246 10*3/uL (ref 150–399)
WBC: 6.4 10^3/mL

## 2014-07-08 ENCOUNTER — Non-Acute Institutional Stay: Payer: Medicare Other | Admitting: Internal Medicine

## 2014-07-08 ENCOUNTER — Encounter: Payer: Self-pay | Admitting: Internal Medicine

## 2014-07-08 VITALS — BP 122/74 | HR 76 | Wt 163.0 lb

## 2014-07-08 DIAGNOSIS — M1711 Unilateral primary osteoarthritis, right knee: Secondary | ICD-10-CM

## 2014-07-08 DIAGNOSIS — M179 Osteoarthritis of knee, unspecified: Secondary | ICD-10-CM

## 2014-07-08 DIAGNOSIS — K257 Chronic gastric ulcer without hemorrhage or perforation: Secondary | ICD-10-CM | POA: Diagnosis not present

## 2014-07-08 DIAGNOSIS — N393 Stress incontinence (female) (male): Secondary | ICD-10-CM | POA: Diagnosis not present

## 2014-07-08 DIAGNOSIS — R413 Other amnesia: Secondary | ICD-10-CM | POA: Diagnosis not present

## 2014-07-08 DIAGNOSIS — D500 Iron deficiency anemia secondary to blood loss (chronic): Secondary | ICD-10-CM | POA: Diagnosis not present

## 2014-07-08 DIAGNOSIS — N3281 Overactive bladder: Secondary | ICD-10-CM | POA: Insufficient documentation

## 2014-07-08 HISTORY — DX: Stress incontinence (female) (male): N39.3

## 2014-07-08 MED ORDER — FERROUS SULFATE 325 (65 FE) MG PO TABS: ORAL_TABLET | ORAL | Status: DC

## 2014-07-08 NOTE — Progress Notes (Signed)
Passed clock drawing 

## 2014-07-08 NOTE — Progress Notes (Signed)
Patient ID: Janet Hernandez, female   DOB: February 28, 1930, 80 y.o.   MRN: [id number]    [place] of Service: Clinic (12)   No Known Allergies  Chief Complaint  Patient presents with  . Medical Management of Chronic Issues    anemia, memory, arthritis  . Urinary Incontinence    saw Dr. Louis Meckel 06/22/14 added Myrbetriq 25mg  one daily for stress incontinence. Follow-up appt 07/29/14 at 10:15  . Flu Vaccine    patient doesn't know if she got it or not    HPI:  Iron deficiency anemia due to chronic blood loss: hgb is falling. Now off iron. Continues carafate and protonix bid. Denies any UGI symptoms. Stools normal.  Gastric erosions, chronic: histoiry of and likely source of microcytic anemia  Memory loss: unchanged  Stress incontinence: now on Myrbetriq  Increasing pain in the lknees. Wants to see Dr., Tonita Cong again.    Medications: Patient's Medications  New Prescriptions   No medications on file  Previous Medications   BISACODYL (DULCOLAX) 5 MG EC TABLET    Take 5 mg by mouth. Take one daily for constipation   GLUCOSAMINE HCL 1500 MG TABS    Take 1,500 mg by mouth daily.   HYDROCODONE-ACETAMINOPHEN (NORCO) 7.5-325 MG PER TABLET    Take one tablet by mouth twice daily for pain   HYDROCORTISONE CREAM 1 %    Apply twice daily to the lesion on the left neck   MECLIZINE (ANTIVERT) 25 MG TABLET    Take 25 mg by mouth 3 (three) times daily as needed. Vertigo    METAXALONE (SKELAXIN) 800 MG TABLET    Take 800 mg by mouth daily.   MIRABEGRON ER (MYRBETRIQ) 25 MG TB24 TABLET    Take 25 mg by mouth. Take one tablet daily for stress incontinence   OXYBUTYNIN (DITROPAN-XL) 5 MG 24 HR TABLET    Take one tablet daily for bladder   PANTOPRAZOLE (PROTONIX) 40 MG TABLET    Take 1 tablet (40 mg total) by mouth 2 (two) times daily.   SUCRALFATE (CARAFATE) 1 G TABLET    Take 1 g by mouth. Take three times daily  Modified Medications   No medications on file    Discontinued Medications   No medications on file     Review of Systems  Constitutional: Negative.  Negative for fever.  HENT: Negative.   Eyes: Negative.   Respiratory: Negative.   Cardiovascular: Negative.   Endocrine: Negative.   Genitourinary: Negative.   Musculoskeletal: Positive for joint swelling and arthralgias.       Pain and swelling at the left knee. Not inflamed or hot.  Skin:       New scaling papule of the left neck  Neurological:       Memory loss  Hematological:       Hx anemia  Psychiatric/Behavioral: Negative.     Filed Vitals:   07/08/14 1454  BP: 122/74  Pulse: 76  Weight: 163 lb (73.936 kg)   Body mass index is 27.12 kg/(m^2).  Physical Exam  Constitutional: She is oriented to person, place, and time. She appears well-developed and well-nourished. She appears distressed.  Neck: No JVD present. No tracheal deviation present. No thyromegaly present.  Cardiovascular: Normal rate, regular rhythm and normal heart sounds.  Exam reveals no gallop and no friction rub.   No murmur heard. Pulmonary/Chest: Breath sounds normal. No respiratory distress.  Abdominal: Soft. Bowel sounds are normal. She exhibits  no distension and no mass. There is no tenderness. There is no guarding.  Musculoskeletal: She exhibits tenderness (left knee anteriorly and posteriorly. No effusion.). She exhibits no edema.  Pain and swelling at the left knee. Old surgical scar at the left knee.  Lymphadenopathy:    She has no cervical adenopathy.  Neurological: She is alert and oriented to person, place, and time. No cranial nerve deficit.  Memory loss 07/08/14 MMSE 28/30. Passed clock drawing.  Skin: No rash noted. No erythema. No pallor.  3 mm lesion of the left neck inferior to pinna  Psychiatric: She has a normal mood and affect. Her behavior is normal. Judgment and thought content normal.     Labs reviewed: Nursing Home on 07/08/2014  Component Date Value Ref Range Status   . Hemoglobin 06/29/2014 11.1* 12.0 - 16.0 g/dL Final  . HCT 06/29/2014 35* 36 - 46 % Final  . Platelets 06/29/2014 246  150 - 399 K/L Final  . WBC 06/29/2014 6.4   Final     Assessment/Plan  1. Iron deficiency anemia due to chronic blood loss - ferrous sulfate 325 (65 FE) MG tablet; One daily to treat anemia  Dispense: 90 tablet; Refill: 3 -CBC, [place], future  2. Gastric erosions, chronic Continue current medications  3. Memory loss observe  4. Stress incontinence Continue Myrbetriq  5. Osteoarthritis of right knee, unspecified osteoarthritis type - Ambulatory referral to Orthopedic Surgery

## 2014-07-09 ENCOUNTER — Telehealth: Payer: Self-pay | Admitting: *Deleted

## 2014-07-09 NOTE — Telephone Encounter (Signed)
Estelle with [facility] called and wanted to confirm the Directions for taking the Ferrous Sulfate. Per Dr. Hervey Ard OV note yesterday patient is suppose to take it once daily. Estelle agreed.

## 2014-07-14 DIAGNOSIS — M25562 Pain in left knee: Secondary | ICD-10-CM | POA: Diagnosis not present

## 2014-07-29 DIAGNOSIS — N3281 Overactive bladder: Secondary | ICD-10-CM | POA: Diagnosis not present

## 2014-07-29 DIAGNOSIS — R350 Frequency of micturition: Secondary | ICD-10-CM | POA: Diagnosis not present

## 2014-07-29 DIAGNOSIS — N3941 Urge incontinence: Secondary | ICD-10-CM | POA: Diagnosis not present

## 2014-09-23 ENCOUNTER — Encounter: Payer: Self-pay | Admitting: Nurse Practitioner

## 2014-09-23 ENCOUNTER — Non-Acute Institutional Stay: Payer: Medicare Other | Admitting: Nurse Practitioner

## 2014-09-23 VITALS — BP 116/68 | HR 72 | Temp 97.7°F | Wt 160.0 lb

## 2014-09-23 DIAGNOSIS — N393 Stress incontinence (female) (male): Secondary | ICD-10-CM | POA: Diagnosis not present

## 2014-09-23 DIAGNOSIS — D649 Anemia, unspecified: Secondary | ICD-10-CM | POA: Diagnosis not present

## 2014-09-23 DIAGNOSIS — R413 Other amnesia: Secondary | ICD-10-CM

## 2014-09-23 DIAGNOSIS — G4700 Insomnia, unspecified: Secondary | ICD-10-CM | POA: Diagnosis not present

## 2014-09-23 DIAGNOSIS — J209 Acute bronchitis, unspecified: Secondary | ICD-10-CM | POA: Diagnosis not present

## 2014-09-23 DIAGNOSIS — H8110 Benign paroxysmal vertigo, unspecified ear: Secondary | ICD-10-CM

## 2014-09-23 DIAGNOSIS — M5440 Lumbago with sciatica, unspecified side: Secondary | ICD-10-CM | POA: Diagnosis not present

## 2014-09-23 DIAGNOSIS — K269 Duodenal ulcer, unspecified as acute or chronic, without hemorrhage or perforation: Secondary | ICD-10-CM

## 2014-09-23 NOTE — Progress Notes (Signed)
Patient ID: Janet Hernandez, female   DOB: 09/02/1929, 81 y.o.   MRN: [id number]     Code Status: Living Will  No Known Allergies  Chief Complaint  Patient presents with  . chest congestion    7-10 days, coughing up clear/yellow phlegm    HPI: Patient is a 81 y.o. female seen in the clinic at [facility] today for cough/yellow phlegm x 2 weeks and other chronic medical conditions.  Problem List Items Addressed This Visit    Urinary incontinence - Primary    No change.       Memory loss    Still able to function independently        Lumbago    Relatively stable grade 2 anterolisthesis of L4 on L5, reflecting chronic bilateral pars defects at L4. Associated pedicle screws noted at L5.         Insomnia    Sleeps well except the left knee interfering her sleep recently.         Duodenal ulcer without hemorrhage or perforation    CT abd/pelvis 7/15/1Apparent focal erosion involving the anterior wall of the distal gastric antrum, which may reflect a focal gastric ulceration. No free air seen to suggest significant perforation. A large amount of associated soft tissue inflammation is noted about the antrum of the stomach and pylorus, extending to the adjacent gallbladder and pancreatic head. Better since  Pantoprazole 40mg  bid and Sucralfate 1gm qid.         Benign paroxysmal positional vertigo    stable      Anemia    Stable.      Acute bronchitis    Cough and yellow phlegm production about 2 weeks, afebrile, denied chest pain, SOB, or orthopnea. Augmentin 875mg  bid x 7 days and Mucinex 600mg  bid x 5 days.          Review of Systems:  Review of Systems  Constitutional: Negative for fever, chills, weight loss, malaise/fatigue and diaphoresis.  HENT: Positive for hearing loss. Negative for congestion, ear discharge, ear pain, nosebleeds, sore throat and tinnitus.   Eyes: Negative for blurred vision, double vision, photophobia, pain, discharge and  redness.  Respiratory: Positive for cough and sputum production. Negative for hemoptysis, shortness of breath, wheezing and stridor.        Yellow phlegm   Cardiovascular: Negative for chest pain, palpitations, orthopnea, claudication, leg swelling and PND.  Gastrointestinal: Negative for heartburn, nausea, vomiting, abdominal pain, diarrhea, constipation, blood in stool and melena.  Genitourinary: Positive for frequency. Negative for dysuria, urgency, hematuria and flank pain.  Musculoskeletal: Negative for myalgias, back pain, joint pain, falls and neck pain.  Skin: Negative for itching and rash.  Neurological: Negative for dizziness, tingling, tremors, sensory change, speech change, focal weakness, seizures, loss of consciousness, weakness and headaches.  Endo/Heme/Allergies: Negative for environmental allergies and polydipsia. Does not bruise/bleed easily.  Psychiatric/Behavioral: Positive for memory loss. Negative for depression, hallucinations and substance abuse. The patient is not nervous/anxious and does not have insomnia.      Past Medical History  Diagnosis Date  . Arthritis   . Rash     inner legs- ? med allergy- to see PCP if doesnt improve by end of week  . Overactive bladder   . Myopathy, unspecified   . Benign paroxysmal positional vertigo   . Right bundle branch block   . Unspecified tinnitus   . Osteoarthrosis, unspecified whether generalized or localized, lower leg   . Spinal stenosis, lumbar  region, without neurogenic claudication   . Lumbago 07/01/2009  . Unspecified urinary incontinence   . Urgency of urination   . Other abnormal glucose 03/23/2010  . Knee pain, left anterior   . Other disorders of bone and cartilage(733.99) 09/19/2012  . Urinary frequency 09/18/2012  . Other chest pain 08/28/2012  . Lump or mass in breast 08/01/2012  . Memory loss 01/22/2012  . Candidiasis 01/01/2012  . Unspecified constipation 12/18/2011  . Seborrheic keratoses 05/17/2011  . Myalgia  and myositis, unspecified 05/17/2011  . Spasm of muscle 01/11/2011  . Cramp of limb 01/11/2011  . Disorder of bone and cartilage, unspecified 11/10/2010  . Dizziness and giddiness 11/10/2010  . Anemia, unspecified 09/07/2010  . Insomnia, unspecified 09/07/2010  . Allergic rhinitis due to pollen 07/01/2009   Past Surgical History  Procedure Laterality Date  . Back surgery      lumbar-- ? type  Dr Tonita Cong  . Eye surgery      bilateral cataract extraction with IOL  . Abdominal hysterectomy    . Cleft lip repair      with repair palate  . Breast surgery      right lumpectomy  . Total knee arthroplasty  12/13/2011    Procedure: TOTAL KNEE ARTHROPLASTY;  Surgeon: Johnn Hai, MD;  Location: [facility];  Service: Orthopedics;  Laterality: Left;  . Breast lumpectomy  1993    breast cyst  . Appendectomy  1956  . Esophagogastroduodenoscopy Left 03/10/2013    Procedure: ESOPHAGOGASTRODUODENOSCOPY (EGD);  Surgeon: Beryle Beams, MD;  Location: [facility];  Service: Endoscopy;  Laterality: Left;   Social History:   reports that she has never smoked. She has never used smokeless tobacco. She reports that she does not drink alcohol or use illicit drugs.  History reviewed. No pertinent family history.  Medications: Patient's Medications  New Prescriptions   No medications on file  Previous Medications   BISACODYL (DULCOLAX) 5 MG EC TABLET    Take 5 mg by mouth. Take one daily for constipation   FERROUS SULFATE 325 (65 FE) MG TABLET    One daily to treat anemia   GLUCOSAMINE HCL 1500 MG TABS    Take 1,500 mg by mouth daily.   HYDROCODONE-ACETAMINOPHEN (NORCO) 7.5-325 MG PER TABLET    Take one tablet by mouth twice daily for pain   HYDROCORTISONE CREAM 1 %    Apply twice daily to the lesion on the left neck   MECLIZINE (ANTIVERT) 25 MG TABLET    Take 25 mg by mouth 3 (three) times daily as needed. Vertigo    METAXALONE (SKELAXIN) 800 MG TABLET    Take 800 mg by mouth daily.   MIRABEGRON ER  (MYRBETRIQ) 25 MG TB24 TABLET    Take 25 mg by mouth. Take one tablet daily for stress incontinence   OXYBUTYNIN (DITROPAN-XL) 5 MG 24 HR TABLET    Take one tablet daily for bladder   PANTOPRAZOLE (PROTONIX) 40 MG TABLET    Take 1 tablet (40 mg total) by mouth 2 (two) times daily.   SUCRALFATE (CARAFATE) 1 G TABLET    Take 1 g by mouth. Take twice daily to protect lining of stomach and esophagus.   VOLTAREN 1 % GEL    Apply to skin as needed for pain  Modified Medications   No medications on file  Discontinued Medications   No medications on file     Physical Exam: Physical Exam  Constitutional: She is oriented to person, place,  and time. She appears well-developed and well-nourished. No distress.  Neck: No JVD present. No tracheal deviation present. No thyromegaly present.  Cardiovascular: Normal rate, regular rhythm and normal heart sounds.  Exam reveals no gallop and no friction rub.   No murmur heard. Pulmonary/Chest: Breath sounds normal. No respiratory distress.  Abdominal: Soft. Bowel sounds are normal. She exhibits no distension and no mass. There is no tenderness. There is no guarding.  Musculoskeletal: She exhibits tenderness. She exhibits no edema.  Old surgical scar at the left knee.  Lymphadenopathy:    She has no cervical adenopathy.  Neurological: She is alert and oriented to person, place, and time. No cranial nerve deficit.  Skin: No rash noted. No erythema. No pallor.  Psychiatric: She has a normal mood and affect. Her behavior is normal. Judgment and thought content normal.    Filed Vitals:   09/23/14 1505  BP: 116/68  Pulse: 72  Temp: 97.7 F (36.5 C)  TempSrc: Oral  Weight: 160 lb (72.576 kg)      Labs reviewed: Basic Metabolic Panel:  Recent Labs  01/14/14  NA 141  K 4.5  BUN 18  CREATININE 0.8   Liver Function Tests:  Recent Labs  01/14/14  AST 15  ALT 10  ALKPHOS 77   No results for input(s): LIPASE, AMYLASE in the last 8760  hours. CBC:  Recent Labs  01/14/14 06/29/14  WBC 5.2 6.4  HGB 12.3 11.1*  HCT 36 35*  PLT 257 246   Past Procedures:  09/23/12 US renal:   IMPRESSION:  Normal renal ultrasound. Small benign appearing bilateral renal  cyst.  03/10/13 CT abd  IMPRESSION:  1. Apparent focal erosion involving the anterior wall of the  distal gastric antrum, which may reflect a focal gastric  ulceration. No free air seen to suggest significant perforation.  A large amount of associated soft tissue inflammation is noted  about the antrum of the stomach and pylorus, extending to the  adjacent gallbladder and pancreatic head.  2. Apparent mild chronic right-sided hydronephrosis, new from  prior renal ultrasound; no distal obstructing stone seen. This may  reflect an underlying stricture at the right ureteropelvic  junction, though not definitely characterized on delayed images.  3. 2 mm nonobstructing stone near the upper pole of the left  kidney; small bilateral renal cysts seen.  4. Mild scattered calcification along the distal abdominal aorta  and its branches.  5. Relatively stable grade 2 anterolisthesis of L4 on L5,  reflecting chronic bilateral pars defects at L4. Associated  pedicle screws noted at L5.  6. Mild bibasilar atelectasis or scarring noted.   Recent Labs  03/10/13 0405 03/10/13 0413 03/11/13 0446  WBC 8.9  --  6.2  NEUTROABS 6.9  --   --   HGB 12.6 13.6 11.9*  HCT 39.0 40.0 38.0  MCV 77.4*  --  77.7*  PLT 275  --  235     Recent Labs  03/10/13 0405 03/10/13 0413 03/11/13 0446  WBC 8.9  --  6.2  NEUTROABS 6.9  --   --   HGB 12.6 13.6 11.9*  HCT 39.0 40.0 38.0  MCV 77.4*  --  77.7*  PLT 275  --  235     Assessment/Plan Urinary incontinence No change.    Insomnia Sleeps well except the left knee interfering her sleep recently.      Duodenal ulcer without hemorrhage or perforation CT abd/pelvis 7/15/1Apparent focal erosion involving the anterior  wall of  the distal gastric antrum, which may reflect a focal gastric ulceration. No free air seen to suggest significant perforation. A large amount of associated soft tissue inflammation is noted about the antrum of the stomach and pylorus, extending to the adjacent gallbladder and pancreatic head. Better since  Pantoprazole 40mg  bid and Sucralfate 1gm qid.      Benign paroxysmal positional vertigo stable   Lumbago Relatively stable grade 2 anterolisthesis of L4 on L5, reflecting chronic bilateral pars defects at L4. Associated pedicle screws noted at L5.      Memory loss Still able to function independently     Anemia Stable.   Acute bronchitis Cough and yellow phlegm production about 2 weeks, afebrile, denied chest pain, SOB, or orthopnea. Augmentin 875mg  bid x 7 days and Mucinex 600mg  bid x 5 days.      Family/ Staff Communication: observe the patient.   Goals of Care: IL  Labs/tests ordered: none

## 2014-09-23 NOTE — Assessment & Plan Note (Signed)
CT abd/pelvis 7/15/1Apparent focal erosion involving the anterior wall of the distal gastric antrum, which may reflect a focal gastric ulceration. No free air seen to suggest significant perforation. A large amount of associated soft tissue inflammation is noted about the antrum of the stomach and pylorus, extending to the adjacent gallbladder and pancreatic head. Better since  Pantoprazole 40mg  bid and Sucralfate 1gm qid.

## 2014-09-23 NOTE — Assessment & Plan Note (Signed)
stable °

## 2014-09-23 NOTE — Assessment & Plan Note (Signed)
No change 

## 2014-09-23 NOTE — Assessment & Plan Note (Signed)
Still able to function independently

## 2014-09-23 NOTE — Assessment & Plan Note (Signed)
Sleeps well except the left knee interfering her sleep recently.

## 2014-09-23 NOTE — Assessment & Plan Note (Signed)
Relatively stable grade 2 anterolisthesis of L4 on L5, reflecting chronic bilateral pars defects at L4. Associated pedicle screws noted at L5.

## 2014-09-23 NOTE — Assessment & Plan Note (Signed)
Cough and yellow phlegm production about 2 weeks, afebrile, denied chest pain, SOB, or orthopnea. Augmentin 875mg  bid x 7 days and Mucinex 600mg  bid x 5 days.

## 2014-09-23 NOTE — Assessment & Plan Note (Signed)
Stable

## 2014-10-19 DIAGNOSIS — D500 Iron deficiency anemia secondary to blood loss (chronic): Secondary | ICD-10-CM | POA: Diagnosis not present

## 2014-10-19 LAB — CBC AND DIFFERENTIAL
HCT: 44 % (ref 36–46)
HEMOGLOBIN: 14.6 g/dL (ref 12.0–16.0)
Platelets: 248 10*3/uL (ref 150–399)
WBC: 6.6 10^3/mL

## 2014-10-28 ENCOUNTER — Encounter: Payer: Self-pay | Admitting: Internal Medicine

## 2014-10-28 ENCOUNTER — Non-Acute Institutional Stay: Payer: Medicare Other | Admitting: Internal Medicine

## 2014-10-28 VITALS — BP 124/78 | HR 88 | Temp 97.8°F | Wt 159.0 lb

## 2014-10-28 DIAGNOSIS — R350 Frequency of micturition: Secondary | ICD-10-CM | POA: Diagnosis not present

## 2014-10-28 DIAGNOSIS — M5440 Lumbago with sciatica, unspecified side: Secondary | ICD-10-CM

## 2014-10-28 DIAGNOSIS — G8929 Other chronic pain: Secondary | ICD-10-CM | POA: Diagnosis not present

## 2014-10-28 DIAGNOSIS — D649 Anemia, unspecified: Secondary | ICD-10-CM

## 2014-10-28 DIAGNOSIS — R21 Rash and other nonspecific skin eruption: Secondary | ICD-10-CM | POA: Diagnosis not present

## 2014-10-28 DIAGNOSIS — N393 Stress incontinence (female) (male): Secondary | ICD-10-CM | POA: Diagnosis not present

## 2014-10-28 DIAGNOSIS — M25562 Pain in left knee: Secondary | ICD-10-CM

## 2014-10-28 DIAGNOSIS — N3281 Overactive bladder: Secondary | ICD-10-CM | POA: Diagnosis not present

## 2014-10-28 HISTORY — DX: Rash and other nonspecific skin eruption: R21

## 2014-10-28 MED ORDER — FLUCONAZOLE 100 MG PO TABS: ORAL_TABLET | ORAL | Status: DC

## 2014-10-28 MED ORDER — NYSTATIN-TRIAMCINOLONE 100000-0.1 UNIT/GM-% EX CREA: TOPICAL_CREAM | CUTANEOUS | Status: DC

## 2014-10-28 NOTE — Progress Notes (Signed)
Patient ID: Janet Hernandez, female   DOB: [birth year], 81 y.o.   MRN: [id number]    [place] of Service: Clinic (12)    No Known Allergies  Chief Complaint  Patient presents with  . Medical Management of Chronic Issues    memory, anemia.  . Rash    in groin area for 2 weeks, using barrier cream, burning off and on    HPI:  Anemia, unspecified anemia type: back to normal  Chronic pain of left knee: unchanged  Midline low back pain with sciatica, sciatica laterality unspecified: unchanged  Stress incontinence: continues to see the urologist. Oxybutynin stopped today  Rash and nonspecific skin eruption: vaginal irritation and discomfort    Medications: Patient's Medications  New Prescriptions   No medications on file  Previous Medications   BISACODYL (DULCOLAX) 5 MG EC TABLET    Take 5 mg by mouth. Take one daily for constipation   FERROUS SULFATE 325 (65 FE) MG TABLET    One daily to treat anemia   GLUCOSAMINE HCL 1500 MG TABS    Take 1,500 mg by mouth daily.   HYDROCODONE-ACETAMINOPHEN (NORCO) 7.5-325 MG PER TABLET    Take one tablet by mouth twice daily for pain   HYDROCORTISONE CREAM 1 %    Apply twice daily to the lesion on the left neck   MECLIZINE (ANTIVERT) 25 MG TABLET    Take 25 mg by mouth 3 (three) times daily as needed. Vertigo    METAXALONE (SKELAXIN) 800 MG TABLET    Take 800 mg by mouth daily.   MIRABEGRON ER (MYRBETRIQ) 25 MG TB24 TABLET    Take 25 mg by mouth. Take one tablet daily for stress incontinence   PANTOPRAZOLE (PROTONIX) 40 MG TABLET    Take 1 tablet (40 mg total) by mouth 2 (two) times daily.   SUCRALFATE (CARAFATE) 1 G TABLET    Take 1 g by mouth. Take twice daily to protect lining of stomach and esophagus.   VOLTAREN 1 % GEL    Apply to skin as needed for pain  Modified Medications   No medications on file  Discontinued Medications   OXYBUTYNIN (DITROPAN-XL) 5 MG 24 HR TABLET    Take one tablet daily for  bladder     Review of Systems  Constitutional: Negative for fever, chills and diaphoresis.  HENT: Positive for hearing loss. Negative for congestion, ear discharge, ear pain, nosebleeds, sore throat and tinnitus.   Eyes: Negative for photophobia, pain, discharge and redness.  Respiratory: Positive for cough. Negative for shortness of breath, wheezing and stridor.        Yellow phlegm   Cardiovascular: Negative for chest pain, palpitations and leg swelling.  Gastrointestinal: Negative for nausea, vomiting, abdominal pain, diarrhea, constipation and blood in stool.  Endocrine: Negative for polydipsia.  Genitourinary: Positive for frequency. Negative for dysuria, urgency, hematuria and flank pain.  Musculoskeletal: Negative for myalgias, back pain and neck pain.  Skin: Negative for rash.  Allergic/Immunologic: Negative for environmental allergies.  Neurological: Negative for dizziness, tremors, seizures, weakness and headaches.  Hematological: Does not bruise/bleed easily.  Psychiatric/Behavioral: Negative for hallucinations. The patient is not nervous/anxious.     Filed Vitals:   10/28/14 1620  BP: 124/78  Pulse: 88  Temp: 97.8 F (36.6 C)  TempSrc: Oral  Weight: 159 lb (72.122 kg)   Body mass index is 26.46 kg/(m^2).  Physical Exam  Constitutional: She is oriented to person, place, and  time. She appears well-developed and well-nourished. No distress.  Neck: No JVD present. No tracheal deviation present. No thyromegaly present.  Cardiovascular: Normal rate, regular rhythm and normal heart sounds.  Exam reveals no gallop and no friction rub.   No murmur heard. Pulmonary/Chest: Breath sounds normal. No respiratory distress.  Abdominal: Soft. Bowel sounds are normal. She exhibits no distension and no mass. There is no tenderness. There is no guarding.  Genitourinary:  Erythematous labia bilaterally. With itching and burning.  Musculoskeletal: She exhibits tenderness. She exhibits  no edema.  Old surgical scar at the left knee.  Lymphadenopathy:    She has no cervical adenopathy.  Neurological: She is alert and oriented to person, place, and time. No cranial nerve deficit.  Skin: No rash noted. No erythema. No pallor.  Psychiatric: She has a normal mood and affect. Her behavior is normal. Judgment and thought content normal.     Labs reviewed: Nursing Home on 10/28/2014  Component Date Value Ref Range Status  . Hemoglobin 10/19/2014 14.6  12.0 - 16.0 g/dL Final  . HCT 10/19/2014 44  36 - 46 % Final  . Platelets 10/19/2014 248  150 - 399 K/L Final  . WBC 10/19/2014 6.6   Final     Assessment/Plan  1. Anemia, unspecified anemia type resolved  2. Chronic pain of left knee unchanged  3. Midline low back pain with sciatica, sciatica laterality unspecified unchanged  4. Stress incontinence Continue Myrbetriq  5. Rash and nonspecific skin eruption Mycolog-2 twice daily Diflucan 100 mg qd x 3d

## 2014-11-08 ENCOUNTER — Encounter: Payer: Self-pay | Admitting: Internal Medicine

## 2014-11-18 DIAGNOSIS — M25561 Pain in right knee: Secondary | ICD-10-CM | POA: Diagnosis not present

## 2014-11-22 IMAGING — CR DG CHEST 2V
2 series · 2 of 2 positions shown · non-contrast
Comparison: 10/13/2009

CLINICAL DATA: Cough

CHEST - 2 VIEW

[view not recorded (1 of 2)]
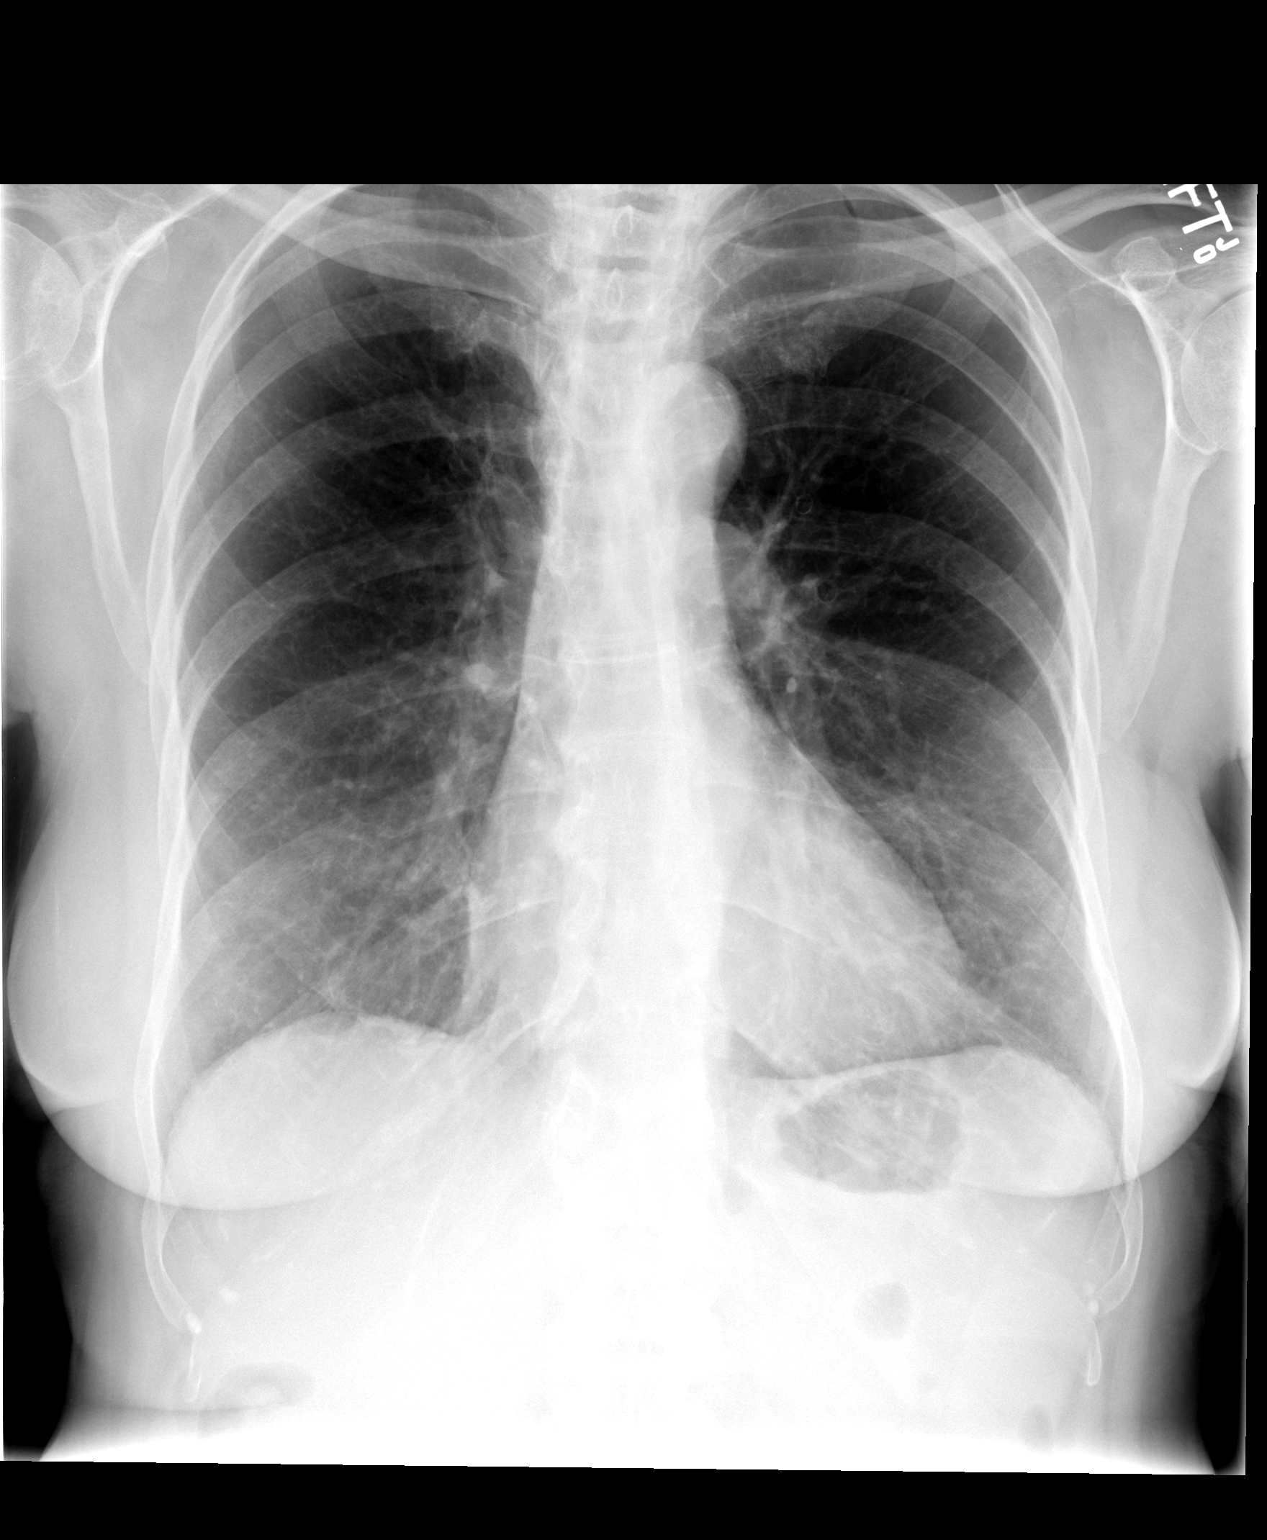

[view not recorded (2 of 2)]
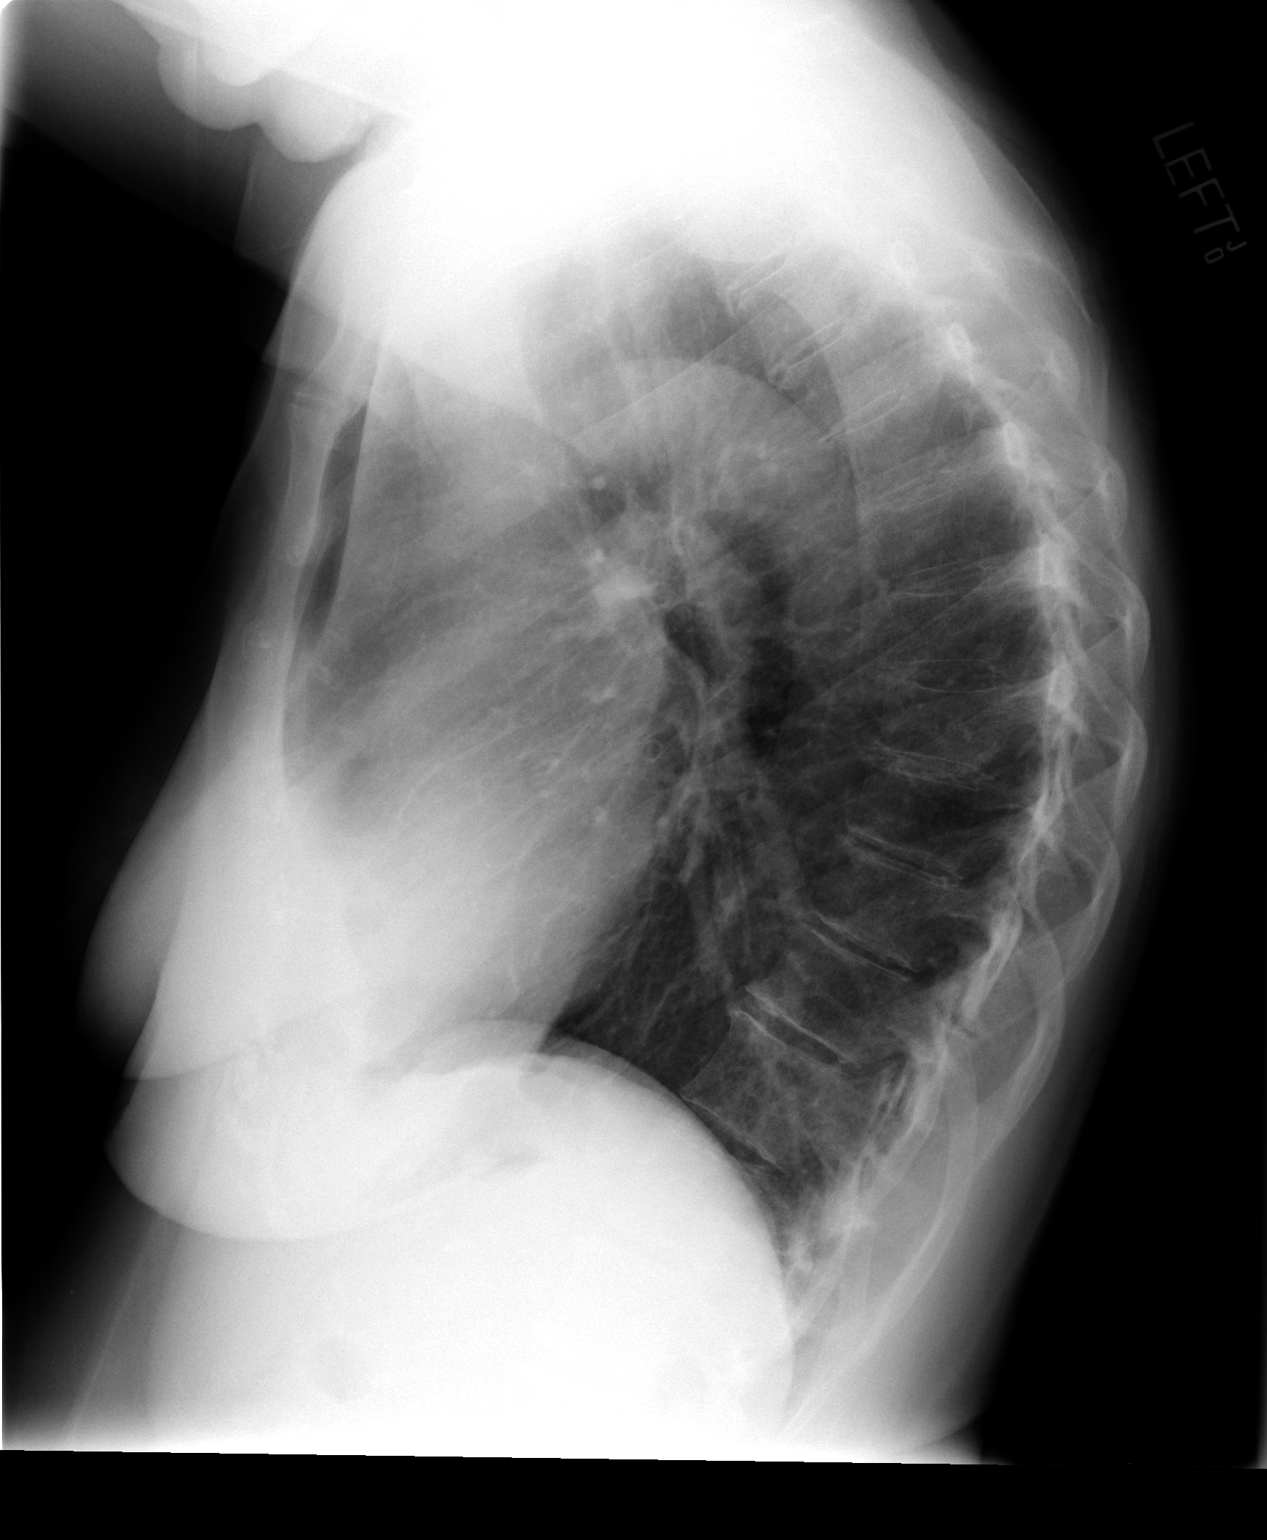

[2 of 2 positions shown; findings below may reference images not displayed]

FINDINGS: Heart size is normal.  Mediastinal shadows are normal.
The lungs are clear.  No infiltrate, mass, effusion or collapse.
No significant bony finding.
IMPRESSION: No active cardiopulmonary disease.

## 2014-11-23 ENCOUNTER — Other Ambulatory Visit: Payer: Self-pay

## 2014-11-23 MED ORDER — HYDROCODONE-ACETAMINOPHEN 7.5-325 MG PO TABS: ORAL_TABLET | ORAL | Status: DC

## 2014-11-23 NOTE — Telephone Encounter (Signed)
RX sent to [facility] @ [phone], phone number [phone]. This is a nursing home refill request.   

## 2014-12-28 DIAGNOSIS — Z96652 Presence of left artificial knee joint: Secondary | ICD-10-CM | POA: Diagnosis not present

## 2014-12-28 DIAGNOSIS — M25562 Pain in left knee: Secondary | ICD-10-CM | POA: Diagnosis not present

## 2014-12-28 DIAGNOSIS — Z471 Aftercare following joint replacement surgery: Secondary | ICD-10-CM | POA: Diagnosis not present

## 2015-01-09 IMAGING — NM NM BONE 3 PHASE
2 series · 12 of 12 positions shown · non-contrast
Comparison: None.

CLINICAL DATA: Left knee pain.  Left total knee replacement and
November 2011.

NUCLEAR MEDICINE THREE PHASE BONE SCAN
TECHNIQUE: Radionuclide angiographic images, immediate static
blood pool images, and 3-hour delayed static images were obtained
after intravenous injection of radiopharmaceutical.
Radiopharmaceutical: [id number] DIETER ZH TECHNETIUM TC 99M
MEDRONATE IV KIT

[Series 1: fl flow and static · 4.75mm/px · 6 of 40 frames shown (1 of 2)]
[frame 4/40  full-range]
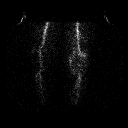
[frame 10/40  full-range]
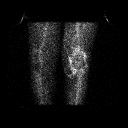
[frame 17/40  full-range]
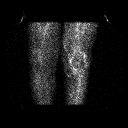
[frame 24/40  full-range]
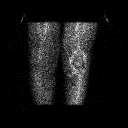
[frame 30/40  full-range]
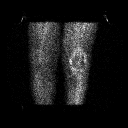
[frame 37/40  full-range]
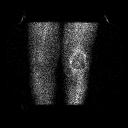

[Series 1: fl flow and static · 4.75mm/px · 6 of 40 frames shown (2 of 2)]
[frame 4/40  full-range]
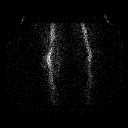
[frame 10/40  full-range]
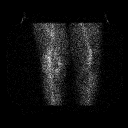
[frame 17/40  full-range]
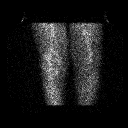
[frame 24/40  full-range]
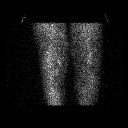
[frame 30/40  full-range]
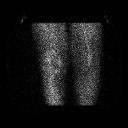
[frame 37/40  full-range]
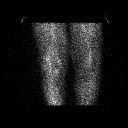

[12 of 12 positions shown; findings below may reference images not displayed]

FINDINGS: There is increased perfusion to the synovium of the left
knee joint on the perfusion images.

On blood pool images there is intense enhancement of the synovium
of the left knee.

Delayed bone images demonstrate increased activity around the
tibial component of the left total knee prosthesis.
IMPRESSION: Asymmetric increased activity around the tibial
component is suggestive of loosening of that component. The
synovial enhancement is consistent with synovitis which could be
sterile and is often seen this soon after surgery.

## 2015-01-27 ENCOUNTER — Other Ambulatory Visit: Payer: Self-pay | Admitting: *Deleted

## 2015-01-27 MED ORDER — HYDROCODONE-ACETAMINOPHEN 7.5-325 MG PO TABS: ORAL_TABLET | ORAL | Status: DC

## 2015-01-27 NOTE — Telephone Encounter (Signed)
[facility] 

## 2015-02-11 DIAGNOSIS — M545 Low back pain: Secondary | ICD-10-CM | POA: Diagnosis not present

## 2015-02-21 ENCOUNTER — Other Ambulatory Visit: Payer: Self-pay

## 2015-02-21 ENCOUNTER — Other Ambulatory Visit: Payer: Self-pay | Admitting: *Deleted

## 2015-02-21 MED ORDER — HYDROCODONE-ACETAMINOPHEN 7.5-325 MG PO TABS: ORAL_TABLET | ORAL | Status: DC

## 2015-02-21 NOTE — Telephone Encounter (Signed)
[facility]

## 2015-03-07 DIAGNOSIS — M7061 Trochanteric bursitis, right hip: Secondary | ICD-10-CM | POA: Diagnosis not present

## 2015-03-17 ENCOUNTER — Non-Acute Institutional Stay: Payer: Medicare Other | Admitting: Internal Medicine

## 2015-03-17 ENCOUNTER — Encounter: Payer: Self-pay | Admitting: Internal Medicine

## 2015-03-17 VITALS — BP 132/72 | HR 68 | Temp 98.7°F | Wt 156.0 lb

## 2015-03-17 DIAGNOSIS — R21 Rash and other nonspecific skin eruption: Secondary | ICD-10-CM | POA: Diagnosis not present

## 2015-03-17 DIAGNOSIS — M25562 Pain in left knee: Secondary | ICD-10-CM

## 2015-03-17 DIAGNOSIS — M5440 Lumbago with sciatica, unspecified side: Secondary | ICD-10-CM

## 2015-03-17 DIAGNOSIS — K269 Duodenal ulcer, unspecified as acute or chronic, without hemorrhage or perforation: Secondary | ICD-10-CM | POA: Diagnosis not present

## 2015-03-17 DIAGNOSIS — G8929 Other chronic pain: Secondary | ICD-10-CM

## 2015-03-17 DIAGNOSIS — R413 Other amnesia: Secondary | ICD-10-CM | POA: Diagnosis not present

## 2015-03-17 LAB — HEPATIC FUNCTION PANEL
ALK PHOS: 90 U/L (ref 25–125)
ALT: 16 U/L (ref 7–35)
AST: 15 U/L (ref 13–35)
BILIRUBIN, TOTAL: 0.4 mg/dL

## 2015-03-17 LAB — BASIC METABOLIC PANEL
BUN: 25 mg/dL — AB (ref 4–21)
CREATININE: 0.8 mg/dL (ref 0.5–1.1)
Glucose: 101 mg/dL
POTASSIUM: 4.1 mmol/L (ref 3.4–5.3)
SODIUM: 141 mmol/L (ref 137–147)

## 2015-03-17 NOTE — Progress Notes (Signed)
Patient ID: Janet Hernandez, female   DOB: 1929/11/28, 81 y.o.   MRN: [id number]    [place] [facility] Service: [facility] (12)     No Known Allergies  Chief Complaint  Patient presents with  . Medical Management of Chronic Issues    anemia, memory    HPI:  Patient presents for follow-up of multiple chronic diagnoses.  Chronic pain of left knee: Continues to be a problem with moving about. She denies any swelling of the knee. There is chronic discomfort and crepitance when walking.  Memory loss: Patient is aware of some mild memory loss problems. She does not believe it is gotten any worse over the last several months.  Midline low back pain with sciatica, sciatica laterality unspecified: Chronic low back discomfort which is not disabling. Walking aggravates the discomfort.  Duodenal ulcer without hemorrhage or perforation: Denies epigastric discomfort or change in bowel movements. No blood in the stool or melanoma.  Rash and nonspecific skin eruption: Resolved    Medications: Patient's Medications  New Prescriptions   No medications on file  Previous Medications   BISACODYL (DULCOLAX) 5 MG EC TABLET    Take 5 mg by mouth. Take one daily for constipation   FERROUS SULFATE 325 (65 FE) MG TABLET    One daily to treat anemia   GLUCOSAMINE HCL 1500 MG TABS    Take 1,500 mg by mouth daily.   HYDROCODONE-ACETAMINOPHEN (NORCO) 7.5-325 MG PER TABLET    Take one tablet by mouth twice daily for pain   HYDROCORTISONE CREAM 1 %    Apply twice daily to the lesion on the left neck   MECLIZINE (ANTIVERT) 25 MG TABLET    Take 25 mg by mouth 3 (three) times daily as needed. Vertigo    MIRABEGRON ER (MYRBETRIQ) 25 MG TB24 TABLET    Take 25 mg by mouth. Take one tablet daily for stress incontinence   PANTOPRAZOLE (PROTONIX) 40 MG TABLET    Take 1 tablet (40 mg total) by mouth 2 (two) times daily.   SUCRALFATE (CARAFATE) 1 G TABLET    Take 1 g by mouth. Take twice  daily to protect lining of stomach and esophagus.   TIZANIDINE (ZANAFLEX) 2 MG TABLET    Take one tablet daily  Modified Medications   No medications on file  Discontinued Medications   FLUCONAZOLE (DIFLUCAN) 100 MG TABLET    One daily to help rash   METAXALONE (SKELAXIN) 800 MG TABLET    Take 800 mg by mouth daily.   NYSTATIN-TRIAMCINOLONE (MYCOLOG II) CREAM    Apply sparingly twice daily to labia and urethra for rash   VOLTAREN 1 % GEL    Apply to skin as needed for pain     Review of Systems  Constitutional: Negative for fever, chills and diaphoresis.  HENT: Positive for hearing loss. Negative for congestion, ear discharge, ear pain, nosebleeds, sore throat and tinnitus.   Eyes: Negative for photophobia, pain, discharge and redness.  Respiratory: Positive for cough. Negative for shortness of breath, wheezing and stridor.        Yellow phlegm   Cardiovascular: Negative for chest pain, palpitations and leg swelling.  Gastrointestinal: Negative for nausea, vomiting, abdominal pain, diarrhea, constipation and blood in stool.  Endocrine: Negative for polydipsia.  Genitourinary: Positive for frequency. Negative for dysuria, urgency, hematuria and flank pain.  Musculoskeletal: Negative for myalgias, back pain and neck pain.  Skin: Negative for rash.  Allergic/Immunologic: Negative  for environmental allergies.  Neurological: Negative for dizziness, tremors, seizures, weakness and headaches.  Hematological: Does not bruise/bleed easily.  Psychiatric/Behavioral: Negative for hallucinations. The patient is not nervous/anxious.     Filed Vitals:   03/17/15 1557  BP: 132/72  Pulse: 68  Temp: 98.7 F (37.1 C)  TempSrc: Oral  Weight: 156 lb (70.761 kg)  SpO2: 90%   Body mass index is 25.96 kg/(m^2).  Physical Exam  Constitutional: She is oriented to person, place, and time. She appears well-developed and well-nourished. No distress.  Neck: No JVD present. No tracheal deviation  present. No thyromegaly present.  Cardiovascular: Normal rate, regular rhythm and normal heart sounds.  Exam reveals no gallop and no friction rub.   No murmur heard. Pulmonary/Chest: Breath sounds normal. No respiratory distress.  Abdominal: Soft. Bowel sounds are normal. She exhibits no distension and no mass. There is no tenderness. There is no guarding.  Genitourinary:  Previous problem with erythematous labia bilaterally. Now says the rash has resolved and there is no itching and burning.  Musculoskeletal: She exhibits tenderness. She exhibits no edema.  Old surgical scar at the left knee.  Lymphadenopathy:    She has no cervical adenopathy.  Neurological: She is alert and oriented to person, place, and time. No cranial nerve deficit.  Skin: No rash noted. No erythema. No pallor.  Psychiatric: She has a normal mood and affect. Her behavior is normal. Judgment and thought content normal.     Labs reviewed: No visits with results within 3 Month(s) from this visit. Latest known visit with results is:  Nursing Home on 10/28/2014  Component Date Value Ref Range Status  . Hemoglobin 10/19/2014 14.6  12.0 - 16.0 g/dL Final  . HCT 10/19/2014 44  36 - 46 % Final  . Platelets 10/19/2014 248  150 - 399 K/L Final  . WBC 10/19/2014 6.6   Final     Assessment/Plan  1. Chronic pain of left knee Degenerative arthritis is present. Recommended that she use Aspercreme or Myoflex.  2. Memory loss Observe  3. Midline low back pain with sciatica, sciatica laterality unspecified Observe  4. Duodenal ulcer without hemorrhage or perforation Seems to be resolved. Asymptomatic. Patient had previous anemia which appeared to be resolved in February. Needs follow-up next visit.  5. Rash and nonspecific skin eruption Resolved

## 2015-03-18 ENCOUNTER — Encounter: Payer: Self-pay | Admitting: *Deleted

## 2015-05-26 DIAGNOSIS — Z23 Encounter for immunization: Secondary | ICD-10-CM | POA: Diagnosis not present

## 2015-05-31 DIAGNOSIS — R351 Nocturia: Secondary | ICD-10-CM | POA: Diagnosis not present

## 2015-05-31 DIAGNOSIS — R32 Unspecified urinary incontinence: Secondary | ICD-10-CM | POA: Diagnosis not present

## 2015-05-31 DIAGNOSIS — R3915 Urgency of urination: Secondary | ICD-10-CM | POA: Diagnosis not present

## 2015-06-28 ENCOUNTER — Other Ambulatory Visit: Payer: Self-pay | Admitting: *Deleted

## 2015-06-28 MED ORDER — HYDROCODONE-ACETAMINOPHEN 7.5-325 MG PO TABS: ORAL_TABLET | ORAL | Status: DC

## 2015-06-28 NOTE — Telephone Encounter (Signed)
[facility] 

## 2015-08-10 DIAGNOSIS — Z96652 Presence of left artificial knee joint: Secondary | ICD-10-CM | POA: Diagnosis not present

## 2015-08-10 DIAGNOSIS — Z471 Aftercare following joint replacement surgery: Secondary | ICD-10-CM | POA: Diagnosis not present

## 2015-08-31 ENCOUNTER — Other Ambulatory Visit: Payer: Self-pay | Admitting: *Deleted

## 2015-08-31 MED ORDER — HYDROCODONE-ACETAMINOPHEN 7.5-325 MG PO TABS: ORAL_TABLET | ORAL | Status: DC

## 2015-08-31 NOTE — Telephone Encounter (Signed)
[facility] 

## 2015-09-06 DIAGNOSIS — R7309 Other abnormal glucose: Secondary | ICD-10-CM | POA: Diagnosis not present

## 2015-09-06 DIAGNOSIS — E1021 Type 1 diabetes mellitus with diabetic nephropathy: Secondary | ICD-10-CM | POA: Diagnosis not present

## 2015-09-06 DIAGNOSIS — D649 Anemia, unspecified: Secondary | ICD-10-CM | POA: Diagnosis not present

## 2015-09-06 DIAGNOSIS — I4510 Unspecified right bundle-branch block: Secondary | ICD-10-CM | POA: Diagnosis not present

## 2015-09-06 LAB — CBC AND DIFFERENTIAL
HEMATOCRIT: 45 % (ref 36–46)
Hemoglobin: 15.7 g/dL (ref 12.0–16.0)
Platelets: 198 10*3/uL (ref 150–399)
WBC: 7 10^3/mL

## 2015-09-06 LAB — BASIC METABOLIC PANEL
BUN: 26 mg/dL — AB (ref 4–21)
CREATININE: 0.7 mg/dL (ref 0.5–1.1)
Glucose: 85 mg/dL
Potassium: 4.2 mmol/L (ref 3.4–5.3)
Sodium: 140 mmol/L (ref 137–147)

## 2015-09-06 LAB — HEPATIC FUNCTION PANEL
ALT: 15 U/L (ref 7–35)
AST: 17 U/L (ref 13–35)
Alkaline Phosphatase: 87 U/L (ref 25–125)
Bilirubin, Total: 0.4 mg/dL

## 2015-09-06 LAB — LIPID PANEL
CHOLESTEROL: 189 mg/dL (ref 0–200)
HDL: 65 mg/dL (ref 35–70)
LDL Cholesterol: 99 mg/dL
TRIGLYCERIDES: 126 mg/dL (ref 40–160)

## 2015-09-07 ENCOUNTER — Encounter: Payer: Self-pay | Admitting: *Deleted

## 2015-09-15 ENCOUNTER — Encounter: Payer: Self-pay | Admitting: Internal Medicine

## 2015-09-15 ENCOUNTER — Non-Acute Institutional Stay: Payer: Medicare Other | Admitting: Internal Medicine

## 2015-09-15 VITALS — BP 124/86 | HR 74 | Temp 97.4°F | Ht 64.0 in | Wt 156.0 lb

## 2015-09-15 DIAGNOSIS — G4700 Insomnia, unspecified: Secondary | ICD-10-CM

## 2015-09-15 DIAGNOSIS — R413 Other amnesia: Secondary | ICD-10-CM | POA: Diagnosis not present

## 2015-09-15 DIAGNOSIS — L821 Other seborrheic keratosis: Secondary | ICD-10-CM

## 2015-09-15 DIAGNOSIS — M25562 Pain in left knee: Secondary | ICD-10-CM

## 2015-09-15 DIAGNOSIS — G8929 Other chronic pain: Secondary | ICD-10-CM

## 2015-09-15 NOTE — Progress Notes (Signed)
Patient ID: Janet Hernandez, female   DOB: 10-30-1929, 82 y.o.   MRN: [id number]    [place] [facility] Service: [facility] (12)     No Known Allergies  Chief Complaint  Patient presents with  . Annual Exam    Wellness exam  . Medical Management of Chronic Issues    memory and stress incontinence    HPI:   Chronic pain of left knee - she has had a knee replacement in the past, there is a small effusion and discomfort in the area.  Insomnia - broken sleep sleeps more than 3 hours of time  Memory loss - unchanged  Seborrheic keratoses - nuisance factor on the skin. Denies itching or discomfort.    Medications: Patient's Medications  New Prescriptions   No medications on file  Previous Medications   BISACODYL (DULCOLAX) 5 MG EC TABLET    Take 5 mg by mouth. Take one daily for constipation   FERROUS SULFATE 325 (65 FE) MG TABLET    One daily to treat anemia   GLUCOSAMINE HCL 1500 MG TABS    Take 1,500 mg by mouth daily.   HYDROCODONE-ACETAMINOPHEN (NORCO) 7.5-325 MG TABLET    Take one tablet by mouth twice daily for pain   HYDROCORTISONE CREAM 1 %    Apply twice daily to the lesion on the left neck   MECLIZINE (ANTIVERT) 25 MG TABLET    Take 25 mg by mouth 3 (three) times daily as needed. Vertigo    MIRABEGRON ER (MYRBETRIQ) 25 MG TB24 TABLET    Take 25 mg by mouth. Take one tablet daily for stress incontinence   PANTOPRAZOLE (PROTONIX) 40 MG TABLET    Take 1 tablet (40 mg total) by mouth 2 (two) times daily.   SUCRALFATE (CARAFATE) 1 G TABLET    Take 1 g by mouth. Take twice daily to protect lining of stomach and esophagus.   TIZANIDINE (ZANAFLEX) 2 MG TABLET    Take one tablet daily  Modified Medications   No medications on file  Discontinued Medications   No medications on file     Review of Systems  Constitutional: Negative for fever, chills and diaphoresis.  HENT: Positive for hearing loss. Negative for congestion, ear discharge, ear pain,  nosebleeds, sore throat and tinnitus.   Eyes: Negative for photophobia, pain, discharge and redness.  Respiratory: Positive for cough. Negative for shortness of breath, wheezing and stridor.        Yellow phlegm   Cardiovascular: Negative for chest pain, palpitations and leg swelling.  Gastrointestinal: Negative for nausea, vomiting, abdominal pain, diarrhea, constipation and blood in stool.  Endocrine: Negative for polydipsia.  Genitourinary: Positive for frequency. Negative for dysuria, urgency, hematuria and flank pain.  Musculoskeletal: Negative for myalgias, back pain and neck pain.  Skin: Negative for rash.  Allergic/Immunologic: Negative for environmental allergies.  Neurological: Negative for dizziness, tremors, seizures, weakness and headaches.  Hematological: Does not bruise/bleed easily.  Psychiatric/Behavioral: Negative for hallucinations. The patient is not nervous/anxious.     Filed Vitals:   09/15/15 1533  BP: 124/86  Pulse: 74  Temp: 97.4 F (36.3 C)  TempSrc: Oral  Height: '5\' 4"'$  (1.626 m)  Weight: 156 lb (70.761 kg)  SpO2: 97%   Wt Readings from Last 3 Encounters:  09/15/15 156 lb (70.761 kg)  03/17/15 156 lb (70.761 kg)  10/28/14 159 lb (72.122 kg)    Body mass index is 26.76 kg/(m^2).  Physical Exam  Constitutional: She is oriented to person, place, and time. She appears well-developed and well-nourished. No distress.  HENT:  Hare-lip  Neck: No JVD present. No tracheal deviation present. No thyromegaly present.  Cardiovascular: Normal rate, regular rhythm and normal heart sounds.  Exam reveals no gallop and no friction rub.   No murmur heard. Pulmonary/Chest: Breath sounds normal. No respiratory distress.  Abdominal: Soft. Bowel sounds are normal. She exhibits no distension and no mass. There is no tenderness. There is no guarding.  Genitourinary:  Previous problem with erythematous labia bilaterally. Now says the rash has resolved and there is no  itching and burning.  Musculoskeletal: She exhibits tenderness. She exhibits no edema.  Old surgical scar at the left knee.  Lymphadenopathy:    She has no cervical adenopathy.  Neurological: She is alert and oriented to person, place, and time. No cranial nerve deficit.  09/15/15 MMSE 25/30. Passed clock drawing.  Skin: No rash noted. No erythema. No pallor.  Multiple scattered seborrheic keratoses. Dry skin.  Psychiatric: She has a normal mood and affect. Her behavior is normal. Judgment and thought content normal.     Labs reviewed: Lab Summary Latest Ref Rng 09/06/2015 03/17/2015 10/19/2014 06/29/2014 01/14/2014  Hemoglobin 12.0 - 16.0 g/dL 15.7 (None) 14.6 11.1(A) 12.3  Hematocrit 36 - 46 % 45 (None) 44 35(A) 36  White count - 7.0 (None) 6.6 6.4 5.2  Platelet count 150 - 399 K/L 198 (None) 248 246 257  Sodium 137 - 147 mmol/L 140 141 (None) (None) 141  Potassium 3.4 - 5.3 mmol/L 4.2 4.1 (None) (None) 4.5  Calcium - (None) (None) (None) (None) (None)  Phosphorus - (None) (None) (None) (None) (None)  Creatinine 0.5 - 1.1 mg/dL 0.7 0.8 (None) (None) 0.8  AST 13 - 35 U/L 17 15 (None) (None) 15  Alk Phos 25 - 125 U/L 87 90 (None) (None) 77  Bilirubin - (None) (None) (None) (None) (None)  Glucose - 85 101 (None) (None) 99  Cholesterol 0 - 200 mg/dL 189 (None) (None) (None) (None)  HDL cholesterol 35 - 70 mg/dL 65 (None) (None) (None) (None)  Triglycerides 40 - 160 mg/dL 126 (None) (None) (None) (None)  LDL Direct - (None) (None) (None) (None) (None)  LDL Calc - 99 (None) (None) (None) (None)  Total protein - (None) (None) (None) (None) (None)  Albumin - (None) (None) (None) (None) (None)   No results found for: TSH Lab Results  Component Value Date   BUN 26* 09/06/2015   BUN 25* 03/17/2015   BUN 18 01/14/2014   Lab Results  Component Value Date   CREATININE 0.7 09/06/2015   CREATININE 0.8 03/17/2015   CREATININE 0.8 01/14/2014   No results found for:  HGBA1C     Assessment/Plan   1. Chronic pain of left knee Currently on hydrocodone/APAP 7.5/325 twice daily with additional doses if needed. Continue the same.  2. Insomnia Observe. No new medications.  3. Memory loss Continue to observe.  4. Seborrheic keratoses Observe.

## 2015-12-06 DIAGNOSIS — Z471 Aftercare following joint replacement surgery: Secondary | ICD-10-CM | POA: Diagnosis not present

## 2015-12-06 DIAGNOSIS — Z96652 Presence of left artificial knee joint: Secondary | ICD-10-CM | POA: Diagnosis not present

## 2015-12-13 ENCOUNTER — Other Ambulatory Visit: Payer: Self-pay

## 2015-12-13 MED ORDER — HYDROCODONE-ACETAMINOPHEN 7.5-325 MG PO TABS: ORAL_TABLET | ORAL | Status: DC

## 2016-01-10 ENCOUNTER — Other Ambulatory Visit: Payer: Self-pay | Admitting: *Deleted

## 2016-01-10 MED ORDER — HYDROCODONE-ACETAMINOPHEN 7.5-325 MG PO TABS: ORAL_TABLET | ORAL | Status: DC

## 2016-01-10 NOTE — Telephone Encounter (Signed)
[facility]

## 2016-02-07 DIAGNOSIS — Z471 Aftercare following joint replacement surgery: Secondary | ICD-10-CM | POA: Diagnosis not present

## 2016-02-07 DIAGNOSIS — Z96652 Presence of left artificial knee joint: Secondary | ICD-10-CM | POA: Diagnosis not present

## 2016-03-15 ENCOUNTER — Non-Acute Institutional Stay: Payer: Medicare Other | Admitting: Internal Medicine

## 2016-03-15 ENCOUNTER — Encounter: Payer: Self-pay | Admitting: Internal Medicine

## 2016-03-15 VITALS — BP 108/68 | HR 76 | Temp 98.0°F | Ht 64.0 in | Wt 165.0 lb

## 2016-03-15 DIAGNOSIS — N393 Stress incontinence (female) (male): Secondary | ICD-10-CM

## 2016-03-15 DIAGNOSIS — G8929 Other chronic pain: Secondary | ICD-10-CM

## 2016-03-15 DIAGNOSIS — R413 Other amnesia: Secondary | ICD-10-CM

## 2016-03-15 DIAGNOSIS — M179 Osteoarthritis of knee, unspecified: Secondary | ICD-10-CM | POA: Diagnosis not present

## 2016-03-15 DIAGNOSIS — M25562 Pain in left knee: Secondary | ICD-10-CM | POA: Diagnosis not present

## 2016-03-15 DIAGNOSIS — G4700 Insomnia, unspecified: Secondary | ICD-10-CM

## 2016-03-15 DIAGNOSIS — M1711 Unilateral primary osteoarthritis, right knee: Secondary | ICD-10-CM

## 2016-03-15 NOTE — Progress Notes (Signed)
Patient ID: Janet Hernandez, female   DOB: 1930-01-08, 82 y.o.   MRN: [id number]    [facility] (12)     No Known Allergies  Chief Complaint  Patient presents with  . Medical Management of Chronic Issues    medication management memory, insonmia, chronic left kneww pain     HPI:  Osteoarthritis of right knee, unspecified osteoarthritis type - stable. Not using th diclofenac gel often  Stress incontinence - improved on current medications  Memory loss - unchanged. Continues to drive.   Insomnia - improved  Chronic pain of left knee - stable. Not using diclofenac gel very often.  Left hip pains in the middle the night. Worries that she might be headed for hip replacement. Has occasional difficulty walking, but does not use a cane or walker.    Medications: Patient's Medications  New Prescriptions   No medications on file  Previous Medications   BISACODYL (DULCOLAX) 5 MG EC TABLET    Take 5 mg by mouth. Take one daily for constipation   DICLOFENAC SODIUM (VOLTAREN) 1 % GEL    APPLY 4 GRAMS TOPICALLY EVERY 12 HOURS AS NEEDED   HYDROCODONE-ACETAMINOPHEN (NORCO) 7.5-325 MG TABLET    Take one tablet by mouth twice daily for pain   MECLIZINE (ANTIVERT) 25 MG TABLET    Take 25 mg by mouth 3 (three) times daily as needed. Vertigo    MYRBETRIQ 50 MG TB24 TABLET       OXYBUTYNIN CHLORIDE (GELNIQUE) 10 % GEL    Place onto the skin. Apply 1 gm to arms, abdomin, thigh daily   PANTOPRAZOLE (PROTONIX) 40 MG TABLET    Take 1 tablet (40 mg total) by mouth 2 (two) times daily.   SUCRALFATE (CARAFATE) 1 G TABLET    Take 1 g by mouth. Take twice daily to protect lining of stomach and esophagus.  Modified Medications   No medications on file  Discontinued Medications   MIRABEGRON ER (MYRBETRIQ) 25 MG TB24 TABLET    Take 25 mg by mouth. Take one tablet daily for stress incontinence     Review of Systems  Constitutional: Negative for fever, chills and  diaphoresis.  HENT: Positive for hearing loss. Negative for congestion, ear discharge, ear pain, nosebleeds, sore throat and tinnitus.   Eyes: Negative for photophobia, pain, discharge and redness.  Respiratory: Negative for shortness of breath, wheezing and stridor.   Cardiovascular: Negative for chest pain, palpitations and leg swelling.  Gastrointestinal: Negative for nausea, vomiting, abdominal pain, diarrhea, constipation and blood in stool.       Previous problems of gastric erosions. Asymptomatic at this time. Remains on pantoprazole.  Endocrine: Negative for polydipsia.  Genitourinary: Positive for frequency. Negative for dysuria, urgency, hematuria and flank pain.  Musculoskeletal: Negative for myalgias, back pain and neck pain.       Chronic pains in both knees and down the left hip.  Skin: Negative for rash.  Allergic/Immunologic: Negative for environmental allergies.  Neurological: Negative for dizziness, tremors, seizures, weakness and headaches.  Hematological: Does not bruise/bleed easily.  Psychiatric/Behavioral: Negative for hallucinations. The patient is not nervous/anxious.     Filed Vitals:   03/15/16 1509  BP: 108/68  Pulse: 76  Temp: 98 F (36.7 C)  TempSrc: Oral  Height: '5\' 4"'$  (1.626 m)  Weight: 165 lb (74.844 kg)  SpO2: 93%   Wt Readings from Last 3 Encounters:  03/15/16 165 lb (74.844 kg)  09/15/15  156 lb (70.761 kg)  03/17/15 156 lb (70.761 kg)    Body mass index is 28.31 kg/(m^2).  Physical Exam  Constitutional: She is oriented to person, place, and time. She appears well-developed and well-nourished. No distress.  HENT:  Hare-lip  Neck: No JVD present. No tracheal deviation present. No thyromegaly present.  Cardiovascular: Normal rate, regular rhythm and normal heart sounds.  Exam reveals no gallop and no friction rub.   No murmur heard. Pulmonary/Chest: Breath sounds normal. No respiratory distress.  Abdominal: Soft. Bowel sounds are normal.  She exhibits no distension and no mass. There is no tenderness. There is no guarding.  Genitourinary:  Previous problem with erythematous labia bilaterally. Now says the rash has resolved and there is no itching and burning.  Musculoskeletal: She exhibits tenderness. She exhibits no edema.  Old surgical scar at the left knee.  Lymphadenopathy:    She has no cervical adenopathy.  Neurological: She is alert and oriented to person, place, and time. No cranial nerve deficit.  09/15/15 MMSE 25/30. Passed clock drawing.  Skin: No rash noted. No erythema. No pallor.  Multiple scattered seborrheic keratoses. Dry skin.  Psychiatric: She has a normal mood and affect. Her behavior is normal. Judgment and thought content normal.     Labs reviewed: Lab Summary Latest Ref Rng 09/06/2015 03/17/2015 10/19/2014 06/29/2014 01/14/2014  Hemoglobin 12.0 - 16.0 g/dL 15.7 (None) 14.6 11.1(A) 12.3  Hematocrit 36 - 46 % 45 (None) 44 35(A) 36  White count - 7.0 (None) 6.6 6.4 5.2  Platelet count 150 - 399 K/L 198 (None) 248 246 257  Sodium 137 - 147 mmol/L 140 141 (None) (None) 141  Potassium 3.4 - 5.3 mmol/L 4.2 4.1 (None) (None) 4.5  Calcium - (None) (None) (None) (None) (None)  Phosphorus - (None) (None) (None) (None) (None)  Creatinine 0.5 - 1.1 mg/dL 0.7 0.8 (None) (None) 0.8  AST 13 - 35 U/L 17 15 (None) (None) 15  Alk Phos 25 - 125 U/L 87 90 (None) (None) 77  Bilirubin - (None) (None) (None) (None) (None)  Glucose - 85 101 (None) (None) 99  Cholesterol 0 - 200 mg/dL 189 (None) (None) (None) (None)  HDL cholesterol 35 - 70 mg/dL 65 (None) (None) (None) (None)  Triglycerides 40 - 160 mg/dL 126 (None) (None) (None) (None)  LDL Direct - (None) (None) (None) (None) (None)  LDL Calc - 99 (None) (None) (None) (None)  Total protein - (None) (None) (None) (None) (None)  Albumin - (None) (None) (None) (None) (None)   No results found for: TSH Lab Results  Component Value Date   BUN 26* 09/06/2015   BUN 25*  03/17/2015   BUN 18 01/14/2014   Lab Results  Component Value Date   CREATININE 0.7 09/06/2015   CREATININE 0.8 03/17/2015   CREATININE 0.8 01/14/2014     Assessment/Plan  1. Osteoarthritis of right knee, unspecified osteoarthritis type Use hydrocodone/APAP and diclofenac gel  2. Stress incontinence Continue oxybutynin gel and Myrbetriq  3. Memory loss MMSE next visit  4. Insomnia Improved  5. Chronic pain of left knee Continue hydrocodone/APAP and diclofenac gel.

## 2016-03-20 ENCOUNTER — Other Ambulatory Visit: Payer: Self-pay | Admitting: *Deleted

## 2016-03-20 MED ORDER — HYDROCODONE-ACETAMINOPHEN 7.5-325 MG PO TABS: ORAL_TABLET | ORAL | 0 refills | Status: DC

## 2016-03-20 NOTE — Telephone Encounter (Signed)
[facility]

## 2016-04-19 ENCOUNTER — Ambulatory Visit: Payer: Medicare Other | Admitting: Nurse Practitioner

## 2016-04-19 ENCOUNTER — Encounter: Payer: Self-pay | Admitting: Nurse Practitioner

## 2016-04-19 DIAGNOSIS — M5440 Lumbago with sciatica, unspecified side: Secondary | ICD-10-CM

## 2016-04-19 DIAGNOSIS — R413 Other amnesia: Secondary | ICD-10-CM

## 2016-04-19 DIAGNOSIS — N393 Stress incontinence (female) (male): Secondary | ICD-10-CM

## 2016-04-19 DIAGNOSIS — K269 Duodenal ulcer, unspecified as acute or chronic, without hemorrhage or perforation: Secondary | ICD-10-CM | POA: Diagnosis not present

## 2016-04-19 NOTE — Progress Notes (Signed)
Location:   [facility]   Place of Service:   clinic [place] Provider: Marlana Latus NP  Code Status: DNR Goals of Care: [place] Advanced Directives 04/19/2016  Does patient have an advance directive? Yes  Type of Paramedic of [place];Living will  Does patient want to make changes to advanced directive? No - Patient declined  Copy of advanced directive(s) in chart? Yes  Pre-existing out of facility DNR order (yellow form or pink MOST form) -    Chief Complaint  Patient presents with  . Acute Visit    low back pain, catches before she can completely stand straight.    HPI: Patient is a 82 y.o. female seen today for an acute visit for lower back pain, no recent injury, hx of back surgery, prn Norco relieves her pain  Hx of GERD, stable on Protonix and Carafate. No constipation while on DulcoLax. Overactive bladder, controlled on Oxybutynin and Myrbetriq, urination x2/night.   Past Medical History:  Diagnosis Date  . Allergic rhinitis due to pollen 07/01/2009  . Anemia, unspecified 09/07/2010  . Arthritis   . Benign paroxysmal positional vertigo   . Candidiasis 01/01/2012  . Cramp of limb 01/11/2011  . Disorder of bone and cartilage, unspecified 11/10/2010  . Dizziness and giddiness 11/10/2010  . Insomnia, unspecified 09/07/2010  . Knee pain, left anterior   . Lumbago 07/01/2009  . Lump or mass in breast 08/01/2012  . Memory loss 01/22/2012  . Myalgia and myositis, unspecified 05/17/2011  . Myopathy, unspecified ([place])   . Osteoarthrosis, unspecified whether generalized or localized, lower leg   . Other abnormal glucose 03/23/2010  . Other chest pain 08/28/2012  . Other disorders of bone and cartilage(733.99) 09/19/2012  . Overactive bladder   . Rash    inner legs- ? med allergy- to see PCP if doesnt improve by end of week  . Rash and nonspecific skin eruption 10/28/2014  . Right bundle branch block   . Seborrheic keratoses 05/17/2011  . Spasm of muscle 01/11/2011  . Spinal  stenosis, lumbar region, without neurogenic claudication   . Stress incontinence 07/08/2014  . Unspecified constipation 12/18/2011  . Unspecified tinnitus   . Unspecified urinary incontinence   . Urgency of urination   . Urinary frequency 09/18/2012    Past Surgical History:  Procedure Laterality Date  . ABDOMINAL HYSTERECTOMY    . APPENDECTOMY  1956  . BACK SURGERY     lumbar-- ? type  Dr Tonita Cong  . BREAST LUMPECTOMY  1993   breast cyst  . BREAST SURGERY     right lumpectomy  . CLEFT LIP REPAIR     with repair palate  . ESOPHAGOGASTRODUODENOSCOPY Left 03/10/2013   Procedure: ESOPHAGOGASTRODUODENOSCOPY (EGD);  Surgeon: Beryle Beams, MD;  Location: [facility];  Service: Endoscopy;  Laterality: Left;  . EYE SURGERY     bilateral cataract extraction with IOL  . TOTAL KNEE ARTHROPLASTY  12/13/2011   Procedure: TOTAL KNEE ARTHROPLASTY;  Surgeon: Johnn Hai, MD;  Location: [facility];  Service: Orthopedics;  Laterality: Left;    No Known Allergies    Medication List       Accurate as of 04/19/16  4:16 PM. Always use your most recent med list.          bisacodyl 5 MG EC tablet Commonly known as:  DULCOLAX Take 5 mg by mouth. Take one daily for constipation   GELNIQUE 10 % Gel Generic drug:  Oxybutynin Chloride Place onto the skin.  Apply 1 gm to arms, abdomin, thigh daily   HYDROcodone-acetaminophen 7.5-325 MG tablet Commonly known as:  NORCO Take one tablet by mouth two times daily for pain. Take one tablet by mouth every 4 hours as needed for pain. Take two tablets by mouth every 4 hours as needed for severe pain   meclizine 25 MG tablet Commonly known as:  ANTIVERT Take 25 mg by mouth 3 (three) times daily as needed. Vertigo   MYRBETRIQ 50 MG Tb24 tablet Generic drug:  mirabegron ER   pantoprazole 40 MG tablet Commonly known as:  PROTONIX Take 1 tablet (40 mg total) by mouth 2 (two) times daily.   sucralfate 1 g tablet Commonly known as:  CARAFATE Take 1 g by  mouth. Take twice daily to protect lining of stomach and esophagus.       Review of Systems:  Review of Systems  Constitutional: Negative for chills, diaphoresis and fever.  HENT: Positive for hearing loss. Negative for congestion, ear discharge, ear pain, nosebleeds, sore throat and tinnitus.   Eyes: Negative for photophobia, pain, discharge and redness.  Respiratory: Negative for shortness of breath, wheezing and stridor.   Cardiovascular: Negative for chest pain, palpitations and leg swelling.  Gastrointestinal: Negative for abdominal pain, blood in stool, constipation, diarrhea, nausea and vomiting.       Previous problems of gastric erosions. Asymptomatic at this time. Remains on pantoprazole.  Endocrine: Negative for polydipsia.  Genitourinary: Positive for frequency. Negative for dysuria, flank pain, hematuria and urgency.  Musculoskeletal: Positive for back pain and gait problem. Negative for myalgias and neck pain.       Chronic pains in both knees and down the left hip.  Skin: Negative for rash.  Allergic/Immunologic: Negative for environmental allergies.  Neurological: Negative for dizziness, tremors, seizures, weakness and headaches.  Hematological: Does not bruise/bleed easily.  Psychiatric/Behavioral: Negative for hallucinations. The patient is not nervous/anxious.     Health Maintenance  Topic Date Due  . PNA vac Low Risk Adult (2 of 2 - PCV13) 10/02/2012  . INFLUENZA VACCINE  03/27/2016  . TETANUS/TDAP  10/02/2021  . DEXA SCAN  Completed  . ZOSTAVAX  Completed    Physical Exam: Vitals:   04/19/16 1533  BP: 118/70  Pulse: 70  Resp: 20  Temp: 97.9 F (36.6 C)  TempSrc: Oral  Weight: 164 lb 9.6 oz (74.7 kg)  Height: 5\' 4"  (1.626 m)   Body mass index is 28.25 kg/m. Physical Exam  Constitutional: She is oriented to person, place, and time. She appears well-developed and well-nourished. No distress.  HENT:  Hare-lip  Neck: No JVD present. No tracheal  deviation present. No thyromegaly present.  Cardiovascular: Normal rate, regular rhythm and normal heart sounds.  Exam reveals no gallop and no friction rub.   No murmur heard. Pulmonary/Chest: Breath sounds normal. No respiratory distress.  Abdominal: Soft. Bowel sounds are normal. She exhibits no distension and no mass. There is no tenderness. There is no guarding.  Genitourinary:  Genitourinary Comments: Previous problem with erythematous labia bilaterally. Now says the rash has resolved and there is no itching and burning.  Musculoskeletal: She exhibits tenderness. She exhibits no edema.  Old surgical scar at the left knee. Lower back. R+L SIJ pain travels to posterior thighs and knees. Positional.   Lymphadenopathy:    She has no cervical adenopathy.  Neurological: She is alert and oriented to person, place, and time. No cranial nerve deficit.  09/15/15 MMSE 25/30. Passed clock drawing.  Skin:  No rash noted. No erythema. No pallor.  Multiple scattered seborrheic keratoses. Dry skin.  Psychiatric: She has a normal mood and affect. Her behavior is normal. Judgment and thought content normal.    Labs reviewed: Basic Metabolic Panel:  Recent Labs  09/06/15  NA 140  K 4.2  BUN 26*  CREATININE 0.7   Liver Function Tests:  Recent Labs  09/06/15  AST 17  ALT 15  ALKPHOS 87   No results for input(s): LIPASE, AMYLASE in the last 8760 hours. No results for input(s): AMMONIA in the last 8760 hours. CBC:  Recent Labs  09/06/15  WBC 7.0  HGB 15.7  HCT 45  PLT 198   Lipid Panel:  Recent Labs  09/06/15  CHOL 189  HDL 65  LDLCALC 99  TRIG 126   No results found for: HGBA1C  Procedures since last visit: No results found.  Assessment/Plan Duodenal ulcer without hemorrhage or perforation CT abd/pelvis 7/15/1Apparent focal erosion involving the anterior wall of the distal gastric antrum, which may reflect a focal gastric ulceration. No free air seen to suggest  significant perforation. A large amount of associated soft tissue inflammation is noted about the antrum of the stomach and pylorus, extending to the adjacent gallbladder and pancreatic head. Better since  Pantoprazole 40mg  bid and Sucralfate 1gm qid.     Memory loss Update MMSE  Stress incontinence Continue Oxybutynin, Myrbetriq, urinatioin 2-3x/night  Lumbago Hx of it, will X-ray lumbar spine, pelvis, continue prn Norco for pain management, update CBC, CMP, TSH, UA C/S, Hgb a1c    Labs/tests ordered:  @ORDERS @ X-ray lumbar spine, pelvis, CBC, CMP, TSH, Hgb a1c, UA C/S  Next appt:  08/30/2016

## 2016-04-19 NOTE — Assessment & Plan Note (Signed)
Update MMSE

## 2016-04-19 NOTE — Assessment & Plan Note (Signed)
CT abd/pelvis 7/15/1Apparent focal erosion involving the anterior wall of the distal gastric antrum, which may reflect a focal gastric ulceration. No free air seen to suggest significant perforation. A large amount of associated soft tissue inflammation is noted about the antrum of the stomach and pylorus, extending to the adjacent gallbladder and pancreatic head. Better since  Pantoprazole 40mg bid and Sucralfate 1gm qid.    

## 2016-04-19 NOTE — Assessment & Plan Note (Signed)
Hx of it, will X-ray lumbar spine, pelvis, continue prn Norco for pain management, update CBC, CMP, TSH, UA C/S, Hgb a1c

## 2016-04-19 NOTE — Assessment & Plan Note (Signed)
Continue Oxybutynin, Myrbetriq, urinatioin 2-3x/night 

## 2016-04-20 DIAGNOSIS — M545 Low back pain: Secondary | ICD-10-CM | POA: Diagnosis not present

## 2016-04-20 DIAGNOSIS — M25559 Pain in unspecified hip: Secondary | ICD-10-CM | POA: Diagnosis not present

## 2016-04-24 DIAGNOSIS — E1151 Type 2 diabetes mellitus with diabetic peripheral angiopathy without gangrene: Secondary | ICD-10-CM | POA: Diagnosis not present

## 2016-04-24 DIAGNOSIS — M545 Low back pain: Secondary | ICD-10-CM | POA: Diagnosis not present

## 2016-04-24 DIAGNOSIS — R785 Finding of other psychotropic drug in blood: Secondary | ICD-10-CM | POA: Diagnosis not present

## 2016-04-24 DIAGNOSIS — R300 Dysuria: Secondary | ICD-10-CM | POA: Diagnosis not present

## 2016-04-24 DIAGNOSIS — R4689 Other symptoms and signs involving appearance and behavior: Secondary | ICD-10-CM | POA: Diagnosis not present

## 2016-04-24 DIAGNOSIS — E039 Hypothyroidism, unspecified: Secondary | ICD-10-CM | POA: Diagnosis not present

## 2016-04-24 DIAGNOSIS — N760 Acute vaginitis: Secondary | ICD-10-CM | POA: Diagnosis not present

## 2016-04-24 DIAGNOSIS — M25559 Pain in unspecified hip: Secondary | ICD-10-CM | POA: Diagnosis not present

## 2016-04-24 LAB — CBC AND DIFFERENTIAL
HEMATOCRIT: 34 % — AB (ref 36–46)
HEMOGLOBIN: 10.9 g/dL — AB (ref 12.0–16.0)
Platelets: 234 10*3/uL (ref 150–399)
WBC: 5 10*3/mL

## 2016-04-24 LAB — HEPATIC FUNCTION PANEL
ALT: 16 U/L (ref 7–35)
AST: 17 U/L (ref 13–35)
Alkaline Phosphatase: 84 U/L (ref 25–125)
BILIRUBIN, TOTAL: 0.3 mg/dL

## 2016-04-24 LAB — BASIC METABOLIC PANEL
BUN: 17 mg/dL (ref 4–21)
Creatinine: 0.8 mg/dL (ref <=1.1)
GLUCOSE: 92 mg/dL
Potassium: 4.1 mmol/L (ref 3.4–5.3)
SODIUM: 140 mmol/L (ref 137–147)

## 2016-04-24 LAB — TSH: TSH: 1.1 u[IU]/mL (ref <=5.90)

## 2016-04-24 LAB — HEMOGLOBIN A1C: Hemoglobin A1C: 5.6

## 2016-05-04 ENCOUNTER — Other Ambulatory Visit: Payer: Self-pay | Admitting: *Deleted

## 2016-05-17 ENCOUNTER — Encounter: Payer: Self-pay | Admitting: Internal Medicine

## 2016-05-23 ENCOUNTER — Encounter: Payer: Self-pay | Admitting: Nurse Practitioner

## 2016-05-23 ENCOUNTER — Non-Acute Institutional Stay: Payer: Medicare Other | Admitting: Nurse Practitioner

## 2016-05-23 DIAGNOSIS — R413 Other amnesia: Secondary | ICD-10-CM | POA: Diagnosis not present

## 2016-05-23 DIAGNOSIS — M5440 Lumbago with sciatica, unspecified side: Secondary | ICD-10-CM | POA: Diagnosis not present

## 2016-05-23 DIAGNOSIS — M25562 Pain in left knee: Secondary | ICD-10-CM

## 2016-05-23 DIAGNOSIS — K269 Duodenal ulcer, unspecified as acute or chronic, without hemorrhage or perforation: Secondary | ICD-10-CM

## 2016-05-23 NOTE — Assessment & Plan Note (Addendum)
CT abd/pelvis 7/15/1Apparent focal erosion involving the anterior wall of the distal gastric antrum, which may reflect a focal gastric ulceration. No free air seen to suggest significant perforation. A large amount of associated soft tissue inflammation is noted about the antrum of the stomach and pylorus, extending to the adjacent gallbladder and pancreatic head. Better since  Pantoprazole 40mg  bid and Sucralfate 1gm qid. 04/24/16 Hgb 10.9, Na 140, K 4.1, Bun 17, creat 0.83, TSH 1.10, Hgb a1c 5.6

## 2016-05-23 NOTE — Assessment & Plan Note (Signed)
04/20/16 X-ray lumbar spine, pelvis: no displaced acute fracture, moderate degenerative changes of the hips, moderate spondylotic changes.  Continue PRN Norco for pain control.

## 2016-05-23 NOTE — Progress Notes (Signed)
Location:  [facility] Room Number: [id number] Place of Service:  [facility] [phone]) Provider:  Merland Holness, [name]  NP  [name], MD  Patient Care Team: [name], MD as PCP - General (Internal Medicine) [place] Weslie Pretlow [name], NP as Nurse Practitioner (Nurse Practitioner) [name], MD as Attending Physician (Urology) [name], MD as Consulting Physician (Gastroenterology)  Extended Emergency Contact Information Primary Emergency Contact: Frew,[name] Address: [place] [address] [place] Phone: [phone] Relation: Son  Code Status:  Full Code Goals of care: Advanced Directive information Advanced Directives 05/23/2016  Does patient have an advance directive? -  Type of Paramedic of [place];Living will  Does patient want to make changes to advanced directive? No - Patient declined  Copy of advanced directive(s) in chart? Yes  Pre-existing out of facility DNR order (yellow form or pink MOST form) -     Chief Complaint  Patient presents with  . Medical Management of Chronic Issues    HPI:  Pt is a 82 y.o. female seen today for medical management of chronic diseases.     Hx of GERD, stable on Protonix and Carafate. No constipation while on DulcoLax. Overactive bladder, controlled on Oxybutynin and Myrbetriq, urination x2/night. Chronic back pain is managed with prn Norco Past Medical History:  Diagnosis Date  . Allergic rhinitis due to pollen 07/01/2009  . Anemia, unspecified 09/07/2010  . Arthritis   . Benign paroxysmal positional vertigo   . Candidiasis 01/01/2012  . Cramp of limb 01/11/2011  . Disorder of bone and cartilage, unspecified 11/10/2010  . Dizziness and giddiness 11/10/2010  . Insomnia, unspecified 09/07/2010  . Knee pain, left anterior   . Lumbago 07/01/2009  . Lump or mass in breast 08/01/2012  . Memory loss 01/22/2012  . Myalgia and myositis,  unspecified 05/17/2011  . Myopathy, unspecified ([place])   . Osteoarthrosis, unspecified whether generalized or localized, lower leg   . Other abnormal glucose 03/23/2010  . Other chest pain 08/28/2012  . Other disorders of bone and cartilage(733.99) 09/19/2012  . Overactive bladder   . Rash    inner legs- ? med allergy- to see PCP if doesnt improve by end of week  . Rash and nonspecific skin eruption 10/28/2014  . Right bundle branch block   . Seborrheic keratoses 05/17/2011  . Spasm of muscle 01/11/2011  . Spinal stenosis, lumbar region, without neurogenic claudication   . Stress incontinence 07/08/2014  . Unspecified constipation 12/18/2011  . Unspecified tinnitus   . Unspecified urinary incontinence   . Urgency of urination   . Urinary frequency 09/18/2012   Past Surgical History:  Procedure Laterality Date  . ABDOMINAL HYSTERECTOMY    . APPENDECTOMY  1956  . BACK SURGERY     lumbar-- ? type  Dr Tonita Cong  . BREAST LUMPECTOMY  1993   breast cyst  . BREAST SURGERY     right lumpectomy  . CLEFT LIP REPAIR     with repair palate  . ESOPHAGOGASTRODUODENOSCOPY Left 03/10/2013   Procedure: ESOPHAGOGASTRODUODENOSCOPY (EGD);  Surgeon: Beryle Beams, MD;  Location: [facility];  Service: Endoscopy;  Laterality: Left;  . EYE SURGERY     bilateral cataract extraction with IOL  . TOTAL KNEE ARTHROPLASTY  12/13/2011   Procedure: TOTAL KNEE ARTHROPLASTY;  Surgeon: Johnn Hai, MD;  Location: [facility];  Service: Orthopedics;  Laterality: Left;  No Known Allergies    Medication List       Accurate as of 05/23/16  5:04 PM. Always use your most recent med list.          bisacodyl 5 MG EC tablet Commonly known as:  DULCOLAX Take 5 mg by mouth. Take one daily for constipation   GELNIQUE 10 % Gel Generic drug:  Oxybutynin Chloride Place onto the skin. Apply 1 gm to arms, abdomin, thigh daily   HYDROcodone-acetaminophen 7.5-325 MG tablet Commonly known as:  NORCO Take one tablet by mouth  two times daily for pain. Take one tablet by mouth every 4 hours as needed for pain. Take two tablets by mouth every 4 hours as needed for severe pain   meclizine 25 MG tablet Commonly known as:  ANTIVERT Take 25 mg by mouth 3 (three) times daily as needed. Vertigo   MYRBETRIQ 50 MG Tb24 tablet Generic drug:  mirabegron ER   pantoprazole 40 MG tablet Commonly known as:  PROTONIX Take 1 tablet (40 mg total) by mouth 2 (two) times daily.   sucralfate 1 g tablet Commonly known as:  CARAFATE Take 1 g by mouth. Take twice daily to protect lining of stomach and esophagus.       Review of Systems  Constitutional: Negative for chills, diaphoresis and fever.  HENT: Positive for hearing loss. Negative for congestion, ear discharge, ear pain, nosebleeds, sore throat and tinnitus.   Eyes: Negative for photophobia, pain, discharge and redness.  Respiratory: Negative for shortness of breath, wheezing and stridor.   Cardiovascular: Negative for chest pain, palpitations and leg swelling.  Gastrointestinal: Negative for abdominal pain, blood in stool, constipation, diarrhea, nausea and vomiting.       Previous problems of gastric erosions. Asymptomatic at this time. Remains on pantoprazole.  Endocrine: Negative for polydipsia.  Genitourinary: Positive for frequency. Negative for dysuria, flank pain, hematuria and urgency.  Musculoskeletal: Positive for back pain and gait problem. Negative for myalgias and neck pain.       Chronic pains in both knees and down the left hip.  Skin: Negative for rash.  Allergic/Immunologic: Negative for environmental allergies.  Neurological: Negative for dizziness, tremors, seizures, weakness and headaches.  Hematological: Does not bruise/bleed easily.  Psychiatric/Behavioral: Negative for hallucinations. The patient is not nervous/anxious.     Immunization History  Administered Date(s) Administered  . Influenza Whole 05/27/2012, 06/10/2013  .  Influenza-Unspecified 06/14/2014, 05/26/2015  . Pneumococcal Polysaccharide-23 10/03/2011  . Td 10/03/2011  . Zoster 08/28/2007   Pertinent  Health Maintenance Due  Topic Date Due  . PNA vac Low Risk Adult (2 of 2 - PCV13) 10/02/2012  . INFLUENZA VACCINE  03/27/2016  . DEXA SCAN  Completed   Fall Risk  09/15/2015 01/21/2014  Falls in the past year? No No   Functional Status Survey:    Vitals:   05/23/16 1344  BP: 130/70  Pulse: 72  Resp: 18  Temp: 97.6 F (36.4 C)  Weight: 164 lb 9.6 oz (74.7 kg)  Height: 5\' 4"  (1.626 m)   Body mass index is 28.25 kg/m. Physical Exam  Constitutional: She is oriented to person, place, and time. She appears well-developed and well-nourished. No distress.  HENT:  Hare-lip  Neck: No JVD present. No tracheal deviation present. No thyromegaly present.  Cardiovascular: Normal rate, regular rhythm and normal heart sounds.  Exam reveals no gallop and no friction rub.   No murmur heard. Pulmonary/Chest: Breath sounds normal. No respiratory distress.  Abdominal: Soft.  Bowel sounds are normal. She exhibits no distension and no mass. There is no tenderness. There is no guarding.  Genitourinary:  Genitourinary Comments: Previous problem with erythematous labia bilaterally. Now says the rash has resolved and there is no itching and burning.  Musculoskeletal: She exhibits tenderness. She exhibits no edema.  Old surgical scar at the left knee. Lower back. R+L SIJ pain travels to posterior thighs and knees. Positional.   Lymphadenopathy:    She has no cervical adenopathy.  Neurological: She is alert and oriented to person, place, and time. No cranial nerve deficit.  09/15/15 MMSE 25/30. Passed clock drawing.  Skin: No rash noted. No erythema. No pallor.  Multiple scattered seborrheic keratoses. Dry skin.  Psychiatric: She has a normal mood and affect. Her behavior is normal. Judgment and thought content normal.    Labs reviewed:  Recent Labs   09/06/15 04/24/16  NA 140 140  K 4.2 4.1  BUN 26* 17  CREATININE 0.7 0.8    Recent Labs  09/06/15 04/24/16  AST 17 17  ALT 15 16  ALKPHOS 87 84    Recent Labs  09/06/15 04/24/16  WBC 7.0 5.0  HGB 15.7 10.9*  HCT 45 34*  PLT 198 234   Lab Results  Component Value Date   TSH 1.10 04/24/2016   Lab Results  Component Value Date   HGBA1C 5.6 04/24/2016   Lab Results  Component Value Date   CHOL 189 09/06/2015   HDL 65 09/06/2015   LDLCALC 99 09/06/2015   TRIG 126 09/06/2015    Significant Diagnostic Results in last 30 days:  No results found.  Assessment/Plan Duodenal ulcer without hemorrhage or perforation CT abd/pelvis 7/15/1Apparent focal erosion involving the anterior wall of the distal gastric antrum, which may reflect a focal gastric ulceration. No free air seen to suggest significant perforation. A large amount of associated soft tissue inflammation is noted about the antrum of the stomach and pylorus, extending to the adjacent gallbladder and pancreatic head. Better since  Pantoprazole 40mg  bid and Sucralfate 1gm qid. 04/24/16 Hgb 10.9, Na 140, K 4.1, Bun 17, creat 0.83, TSH 1.10, Hgb a1c 5.6      Memory loss 04/24/16 MMSE 22/30, 04/24/16 Hgb 10.9, Na 140, K 4.1, Bun 17, creat 0.83, TSH 1.10, Hgb a1c 5.6 Continue AL for care assistance  Lumbago 04/20/16 X-ray lumbar spine, pelvis: no displaced acute fracture, moderate degenerative changes of the hips, moderate spondylotic changes.  Continue PRN Norco for pain control.   Left knee pain S/p TKR 2013, no apparent deformity, redness, swelling, will apply Lidoderm patch on 12h and off 12h.      Family/ staff Communication: continue AL for care assistance.   Labs/tests ordered:  none

## 2016-05-23 NOTE — Assessment & Plan Note (Signed)
S/p TKR 2013, no apparent deformity, redness, swelling, will apply Lidoderm patch on 12h and off 12h.

## 2016-05-23 NOTE — Assessment & Plan Note (Signed)
04/24/16 MMSE 22/30, 04/24/16 Hgb 10.9, Na 140, K 4.1, Bun 17, creat 0.83, TSH 1.10, Hgb a1c 5.6 Continue AL for care assistance

## 2016-07-11 ENCOUNTER — Encounter: Payer: Self-pay | Admitting: Nurse Practitioner

## 2016-07-11 ENCOUNTER — Non-Acute Institutional Stay: Payer: Medicare Other | Admitting: Nurse Practitioner

## 2016-07-11 DIAGNOSIS — M1711 Unilateral primary osteoarthritis, right knee: Secondary | ICD-10-CM

## 2016-07-11 DIAGNOSIS — K5900 Constipation, unspecified: Secondary | ICD-10-CM | POA: Diagnosis not present

## 2016-07-11 DIAGNOSIS — K5901 Slow transit constipation: Secondary | ICD-10-CM | POA: Insufficient documentation

## 2016-07-11 DIAGNOSIS — R413 Other amnesia: Secondary | ICD-10-CM | POA: Diagnosis not present

## 2016-07-11 DIAGNOSIS — N393 Stress incontinence (female) (male): Secondary | ICD-10-CM

## 2016-07-11 DIAGNOSIS — K269 Duodenal ulcer, unspecified as acute or chronic, without hemorrhage or perforation: Secondary | ICD-10-CM | POA: Diagnosis not present

## 2016-07-11 DIAGNOSIS — H8110 Benign paroxysmal vertigo, unspecified ear: Secondary | ICD-10-CM | POA: Diagnosis not present

## 2016-07-11 NOTE — Assessment & Plan Note (Signed)
04/24/16 MMSE 22/30, 04/24/16 Hgb 10.9, Na 140, K 4.1, Bun 17, creat 0.83, TSH 1.10, Hgb a1c 5.6 Continue AL for care assistance

## 2016-07-11 NOTE — Progress Notes (Signed)
Location:  [place] Room Number: [id number] Place of Service:  SNF (31) Provider:  Destine Ambroise, [name]  NP  [name], MD  Patient Care Team: [name], MD as PCP - General (Internal Medicine) [place] Tamaria Dunleavy [name], NP as Nurse Practitioner (Nurse Practitioner) [name], MD as Attending Physician (Urology) [name], MD as Consulting Physician (Gastroenterology)  Extended Emergency Contact Information Primary Emergency Contact: Arns,[name] Address: [place] [address] [place] Phone: [phone] Relation: Son  Code Status:  Full Code Goals of care: Advanced Directive information Advanced Directives 07/11/2016  Does patient have an advance directive? Yes  Type of Paramedic of [place];Living will  Does patient want to make changes to advanced directive? No - Patient declined  Copy of advanced directive(s) in chart? Yes  Pre-existing out of facility DNR order (yellow form or pink MOST form) -     Chief Complaint  Patient presents with  . Medical Management of Chronic Issues    HPI:  Pt is a 82 y.o. female seen today for medical management of chronic diseases.      Hx of GERD, stable on Protonix and Carafate. No constipation while on DulcoLax. Overactive bladder, controlled on Oxybutynin and Myrbetriq, urination x2/night. Chronic back pain is managed with prn Norco Past Medical History:  Diagnosis Date  . Allergic rhinitis due to pollen 07/01/2009  . Anemia, unspecified 09/07/2010  . Arthritis   . Benign paroxysmal positional vertigo   . Candidiasis 01/01/2012  . Cramp of limb 01/11/2011  . Disorder of bone and cartilage, unspecified 11/10/2010  . Dizziness and giddiness 11/10/2010  . Insomnia, unspecified 09/07/2010  . Knee pain, left anterior   . Lumbago 07/01/2009  . Lump or mass in breast 08/01/2012  . Memory loss 01/22/2012  . Myalgia and myositis,  unspecified 05/17/2011  . Myopathy, unspecified   . Osteoarthrosis, unspecified whether generalized or localized, lower leg   . Other abnormal glucose 03/23/2010  . Other chest pain 08/28/2012  . Other disorders of bone and cartilage(733.99) 09/19/2012  . Overactive bladder   . Rash    inner legs- ? med allergy- to see PCP if doesnt improve by end of week  . Rash and nonspecific skin eruption 10/28/2014  . Right bundle branch block   . Seborrheic keratoses 05/17/2011  . Spasm of muscle 01/11/2011  . Spinal stenosis, lumbar region, without neurogenic claudication   . Stress incontinence 07/08/2014  . Unspecified constipation 12/18/2011  . Unspecified tinnitus   . Unspecified urinary incontinence   . Urgency of urination   . Urinary frequency 09/18/2012   Past Surgical History:  Procedure Laterality Date  . ABDOMINAL HYSTERECTOMY    . APPENDECTOMY  1956  . BACK SURGERY     lumbar-- ? type  Dr Tonita Cong  . BREAST LUMPECTOMY  1993   breast cyst  . BREAST SURGERY     right lumpectomy  . CLEFT LIP REPAIR     with repair palate  . ESOPHAGOGASTRODUODENOSCOPY Left 03/10/2013   Procedure: ESOPHAGOGASTRODUODENOSCOPY (EGD);  Surgeon: Beryle Beams, MD;  Location: [facility];  Service: Endoscopy;  Laterality: Left;  . EYE SURGERY     bilateral cataract extraction with IOL  . TOTAL KNEE ARTHROPLASTY  12/13/2011   Procedure: TOTAL KNEE ARTHROPLASTY;  Surgeon: Johnn Hai, MD;  Location: [facility];  Service: Orthopedics;  Laterality: Left;  No Known Allergies    Medication List       Accurate as of 07/11/16  2:00 PM. Always use your most recent med list.          bisacodyl 5 MG EC tablet Commonly known as:  DULCOLAX Take 5 mg by mouth. Take one daily for constipation   GELNIQUE 10 % Gel Generic drug:  Oxybutynin Chloride Place onto the skin. Apply 1 gm to arms, abdomin, thigh daily   HYDROcodone-acetaminophen 7.5-325 MG tablet Commonly known as:  NORCO Take one tablet by mouth two  times daily for pain. Take one tablet by mouth every 4 hours as needed for pain. Take two tablets by mouth every 4 hours as needed for severe pain   meclizine 25 MG tablet Commonly known as:  ANTIVERT Take 25 mg by mouth 3 (three) times daily as needed. Vertigo   MYRBETRIQ 50 MG Tb24 tablet Generic drug:  mirabegron ER   pantoprazole 40 MG tablet Commonly known as:  PROTONIX Take 1 tablet (40 mg total) by mouth 2 (two) times daily.   sucralfate 1 g tablet Commonly known as:  CARAFATE Take 1 g by mouth. Take twice daily to protect lining of stomach and esophagus.       Review of Systems  Constitutional: Negative for chills, diaphoresis and fever.  HENT: Positive for hearing loss. Negative for congestion, ear discharge, ear pain, nosebleeds, sore throat and tinnitus.   Eyes: Negative for photophobia, pain, discharge and redness.  Respiratory: Negative for shortness of breath, wheezing and stridor.   Cardiovascular: Negative for chest pain, palpitations and leg swelling.  Gastrointestinal: Negative for abdominal pain, blood in stool, constipation, diarrhea, nausea and vomiting.       Previous problems of gastric erosions. Asymptomatic at this time. Remains on pantoprazole.  Endocrine: Negative for polydipsia.  Genitourinary: Positive for frequency. Negative for dysuria, flank pain, hematuria and urgency.  Musculoskeletal: Positive for back pain and gait problem. Negative for myalgias and neck pain.       Chronic pains in both knees and down the left hip.  Skin: Negative for rash.  Allergic/Immunologic: Negative for environmental allergies.  Neurological: Negative for dizziness, tremors, seizures, weakness and headaches.  Hematological: Does not bruise/bleed easily.  Psychiatric/Behavioral: Negative for hallucinations. The patient is not nervous/anxious.     Immunization History  Administered Date(s) Administered  . Influenza Whole 05/27/2012, 06/10/2013  . Influenza-Unspecified  06/14/2014, 05/26/2015, 06/13/2016  . Pneumococcal Polysaccharide-23 10/03/2011  . Td 10/03/2011  . Zoster 08/28/2007   Pertinent  Health Maintenance Due  Topic Date Due  . PNA vac Low Risk Adult (2 of 2 - PCV13) 10/02/2012  . INFLUENZA VACCINE  Completed  . DEXA SCAN  Completed   Fall Risk  09/15/2015 01/21/2014  Falls in the past year? No No   Functional Status Survey:    Vitals:   07/11/16 1134  BP: 126/72  Pulse: 76  Resp: 18  Temp: 98.7 F (37.1 C)  Weight: 169 lb 3.2 oz (76.7 kg)  Height: 5\' 4"  (1.626 m)   Body mass index is 29.04 kg/m. Physical Exam  Constitutional: She is oriented to person, place, and time. She appears well-developed and well-nourished. No distress.  HENT:  Hare-lip  Neck: No JVD present. No tracheal deviation present. No thyromegaly present.  Cardiovascular: Normal rate, regular rhythm and normal heart sounds.  Exam reveals no gallop and no friction rub.   No murmur heard. Pulmonary/Chest: Breath sounds normal. No respiratory distress.  Abdominal:  Soft. Bowel sounds are normal. She exhibits no distension and no mass. There is no tenderness. There is no guarding.  Genitourinary:  Genitourinary Comments: Previous problem with erythematous labia bilaterally. Now says the rash has resolved and there is no itching and burning.  Musculoskeletal: She exhibits tenderness. She exhibits no edema.  Old surgical scar at the left knee. Lower back. R+L SIJ pain travels to posterior thighs and knees. Positional.   Lymphadenopathy:    She has no cervical adenopathy.  Neurological: She is alert and oriented to person, place, and time. No cranial nerve deficit.  09/15/15 MMSE 25/30. Passed clock drawing.  Skin: No rash noted. No erythema. No pallor.  Multiple scattered seborrheic keratoses. Dry skin.  Psychiatric: She has a normal mood and affect. Her behavior is normal. Judgment and thought content normal.    Labs reviewed:  Recent Labs  09/06/15 04/24/16    NA 140 140  K 4.2 4.1  BUN 26* 17  CREATININE 0.7 0.8    Recent Labs  09/06/15 04/24/16  AST 17 17  ALT 15 16  ALKPHOS 87 84    Recent Labs  09/06/15 04/24/16  WBC 7.0 5.0  HGB 15.7 10.9*  HCT 45 34*  PLT 198 234   Lab Results  Component Value Date   TSH 1.10 04/24/2016   Lab Results  Component Value Date   HGBA1C 5.6 04/24/2016   Lab Results  Component Value Date   CHOL 189 09/06/2015   HDL 65 09/06/2015   LDLCALC 99 09/06/2015   TRIG 126 09/06/2015    Significant Diagnostic Results in last 30 days:  No results found.  Assessment/Plan Duodenal ulcer without hemorrhage or perforation CT abd/pelvis 7/15/1Apparent focal erosion involving the anterior wall of the distal gastric antrum, which may reflect a focal gastric ulceration. No free air seen to suggest significant perforation. A large amount of associated soft tissue inflammation is noted about the antrum of the stomach and pylorus, extending to the adjacent gallbladder and pancreatic head. Better since  Pantoprazole 40mg  bid and Sucralfate 1gm qid. 04/24/16 Hgb 10.9, Na 140, K 4.1, Bun 17, creat 0.83, TSH 1.10, Hgb a1c 5.6    Benign paroxysmal positional vertigo Stable, continue prn Meclizine.   Osteoarthritis of right knee S/p TKR 2013, no apparent deformity, redness, swelling, contiue Lidoderm patch on 12h and off 12h. Continue Norco prn.   Memory loss 04/24/16 MMSE 22/30, 04/24/16 Hgb 10.9, Na 140, K 4.1, Bun 17, creat 0.83, TSH 1.10, Hgb a1c 5.6 Continue AL for care assistance   Constipation Stable, continue Dulcolax.   Stress incontinence Continue Oxybutynin, Myrbetriq, urinatioin 2-3x/night     Family/ staff Communication: continue AL  Labs/tests ordered:  none

## 2016-07-11 NOTE — Assessment & Plan Note (Signed)
S/p TKR 2013, no apparent deformity, redness, swelling, contiue Lidoderm patch on 12h and off 12h. Continue Norco prn.

## 2016-07-11 NOTE — Assessment & Plan Note (Signed)
Stable, continue prn Meclizine.  

## 2016-07-11 NOTE — Assessment & Plan Note (Signed)
Continue Oxybutynin, Myrbetriq, urinatioin 2-3x/night 

## 2016-07-11 NOTE — Assessment & Plan Note (Signed)
Stable, continue Dulcolax.

## 2016-07-11 NOTE — Assessment & Plan Note (Signed)
CT abd/pelvis 7/15/1Apparent focal erosion involving the anterior wall of the distal gastric antrum, which may reflect a focal gastric ulceration. No free air seen to suggest significant perforation. A large amount of associated soft tissue inflammation is noted about the antrum of the stomach and pylorus, extending to the adjacent gallbladder and pancreatic head. Better since  Pantoprazole 40mg  bid and Sucralfate 1gm qid. 04/24/16 Hgb 10.9, Na 140, K 4.1, Bun 17, creat 0.83, TSH 1.10, Hgb a1c 5.6

## 2016-07-16 ENCOUNTER — Other Ambulatory Visit: Payer: Self-pay

## 2016-07-16 MED ORDER — HYDROCODONE-ACETAMINOPHEN 7.5-325 MG PO TABS: ORAL_TABLET | ORAL | 0 refills | Status: DC

## 2016-07-16 NOTE — Telephone Encounter (Signed)
Requested by [employer]. Change in directions per prescription from [facility].

## 2016-08-15 ENCOUNTER — Encounter: Payer: Self-pay | Admitting: Nurse Practitioner

## 2016-08-15 ENCOUNTER — Non-Acute Institutional Stay: Payer: Medicare Other | Admitting: Nurse Practitioner

## 2016-08-15 DIAGNOSIS — K269 Duodenal ulcer, unspecified as acute or chronic, without hemorrhage or perforation: Secondary | ICD-10-CM

## 2016-08-15 DIAGNOSIS — K5900 Constipation, unspecified: Secondary | ICD-10-CM | POA: Diagnosis not present

## 2016-08-15 DIAGNOSIS — H8110 Benign paroxysmal vertigo, unspecified ear: Secondary | ICD-10-CM

## 2016-08-15 DIAGNOSIS — R413 Other amnesia: Secondary | ICD-10-CM | POA: Diagnosis not present

## 2016-08-15 DIAGNOSIS — M1711 Unilateral primary osteoarthritis, right knee: Secondary | ICD-10-CM | POA: Diagnosis not present

## 2016-08-15 DIAGNOSIS — N393 Stress incontinence (female) (male): Secondary | ICD-10-CM

## 2016-08-15 NOTE — Assessment & Plan Note (Signed)
04/24/16 MMSE 22/30, 04/24/16 Hgb 10.9, Na 140, K 4.1, Bun 17, creat 0.83, TSH 1.10, Hgb a1c 5.6 Continue AL for care assistance

## 2016-08-15 NOTE — Assessment & Plan Note (Signed)
S/p TKR 2013, no apparent deformity, redness, swelling, contiue Lidoderm patch on 12h and off 12h. Continue Norco prn.

## 2016-08-15 NOTE — Assessment & Plan Note (Signed)
Continue Oxybutynin, Myrbetriq, urinatioin 2-3x/night 

## 2016-08-15 NOTE — Assessment & Plan Note (Signed)
Stable, continue prn Meclizine.  

## 2016-08-15 NOTE — Progress Notes (Signed)
Location:  [place] Room Number: [id number] Place of Service:  ALF [phone]) Provider:  [name], [name]  NP  [name], MD  Patient Care Team: Janet Dooms, MD as PCP - General (Internal Medicine) Janet Hernandez Janet Darner, NP as Nurse Practitioner (Nurse Practitioner) Janet Hughs, MD as Attending Physician (Urology) Janet Ada, MD as Consulting Physician (Gastroenterology)  Extended Emergency Contact Information Primary Emergency Contact: JanetScott Address: [place] [address] [place] Phone: [phone] Relation: Son  Code Status:  DNR Goals of care: Advanced Directive information Advanced Directives 08/15/2016  Does Patient Have a Medical Advance Directive? Yes  Type of Paramedic of [place];Living will  Does patient want to make changes to medical advance directive? No - Patient declined  Copy of [place] in Chart? Yes  Pre-existing out of facility DNR order (yellow form or pink MOST form) -     Chief Complaint  Patient presents with  . Medical Management of Chronic Issues    HPI:  Pt is a 82 y.o. female seen today for medical management of chronic diseases.    Hx of GERD, stable on Protonix and Carafate. No constipation while on DulcoLax. Overactive bladder, controlled on Oxybutynin and Myrbetriq, urination x2/night. Chronic back pain is managed with prn Norco  Past Medical History:  Diagnosis Date  . Allergic rhinitis due to pollen 07/01/2009  . Anemia, unspecified 09/07/2010  . Arthritis   . Benign paroxysmal positional vertigo   . Candidiasis 01/01/2012  . Cramp of limb 01/11/2011  . Disorder of bone and cartilage, unspecified 11/10/2010  . Dizziness and giddiness 11/10/2010  . Insomnia, unspecified 09/07/2010  . Knee pain, left anterior   . Lumbago 07/01/2009  . Lump or mass in breast 08/01/2012  . Memory loss 01/22/2012  . Myalgia and  myositis, unspecified 05/17/2011  . Myopathy, unspecified   . Osteoarthrosis, unspecified whether generalized or localized, lower leg   . Other abnormal glucose 03/23/2010  . Other chest pain 08/28/2012  . Other disorders of bone and cartilage(733.99) 09/19/2012  . Overactive bladder   . Rash    inner legs- ? med allergy- to see PCP if doesnt improve by end of week  . Rash and nonspecific skin eruption 10/28/2014  . Right bundle branch block   . Seborrheic keratoses 05/17/2011  . Spasm of muscle 01/11/2011  . Spinal stenosis, lumbar region, without neurogenic claudication   . Stress incontinence 07/08/2014  . Unspecified constipation 12/18/2011  . Unspecified tinnitus   . Unspecified urinary incontinence   . Urgency of urination   . Urinary frequency 09/18/2012   Past Surgical History:  Procedure Laterality Date  . ABDOMINAL HYSTERECTOMY    . APPENDECTOMY  1956  . BACK SURGERY     lumbar-- ? type  Dr Tonita Cong  . BREAST LUMPECTOMY  1993   breast cyst  . BREAST SURGERY     right lumpectomy  . CLEFT LIP REPAIR     with repair palate  . ESOPHAGOGASTRODUODENOSCOPY Left 03/10/2013   Procedure: ESOPHAGOGASTRODUODENOSCOPY (EGD);  Surgeon: Beryle Beams, MD;  Location: [facility];  Service: Endoscopy;  Laterality: Left;  . EYE SURGERY     bilateral cataract extraction with IOL  . TOTAL KNEE ARTHROPLASTY  12/13/2011   Procedure: TOTAL KNEE ARTHROPLASTY;  Surgeon: Johnn Hai, MD;  Location: [facility];  Service: Orthopedics;  Laterality: Left;  No Known Allergies  Allergies as of 08/15/2016   No Known Allergies     Medication List       Accurate as of 08/15/16 12:05 PM. Always use your most recent med list.          bisacodyl 5 MG EC tablet Commonly known as:  DULCOLAX Take 5 mg by mouth. Take one daily for constipation   GELNIQUE 10 % Gel Generic drug:  Oxybutynin Chloride Place onto the skin. Apply 1 gm to arms, abdomin, thigh daily   HYDROcodone-acetaminophen 7.5-325 MG  tablet Commonly known as:  NORCO Take one tablet by mouth two times a day as needed for pain   meclizine 25 MG tablet Commonly known as:  ANTIVERT Take 25 mg by mouth 3 (three) times daily as needed. Vertigo   MYRBETRIQ 50 MG Tb24 tablet Generic drug:  mirabegron ER   pantoprazole 40 MG tablet Commonly known as:  PROTONIX Take 1 tablet (40 mg total) by mouth 2 (two) times daily.   sucralfate 1 g tablet Commonly known as:  CARAFATE Take 1 g by mouth. Take twice daily to protect lining of stomach and esophagus.       Review of Systems  Constitutional: Negative for chills, diaphoresis and fever.  HENT: Positive for hearing loss. Negative for congestion, ear discharge, ear pain, nosebleeds, sore throat and tinnitus.   Eyes: Negative for photophobia, pain, discharge and redness.  Respiratory: Negative for shortness of breath, wheezing and stridor.   Cardiovascular: Negative for chest pain, palpitations and leg swelling.  Gastrointestinal: Negative for abdominal pain, blood in stool, constipation, diarrhea, nausea and vomiting.       Previous problems of gastric erosions. Asymptomatic at this time. Remains on pantoprazole.  Endocrine: Negative for polydipsia.  Genitourinary: Positive for frequency. Negative for dysuria, flank pain, hematuria and urgency.  Musculoskeletal: Positive for back pain and gait problem. Negative for myalgias and neck pain.       Chronic pains in both knees and down the left hip.  Skin: Negative for rash.  Allergic/Immunologic: Negative for environmental allergies.  Neurological: Negative for dizziness, tremors, seizures, weakness and headaches.  Hematological: Does not bruise/bleed easily.  Psychiatric/Behavioral: Negative for hallucinations. The patient is not nervous/anxious.     Immunization History  Administered Date(s) Administered  . Influenza Whole 05/27/2012, 06/10/2013  . Influenza-Unspecified 06/14/2014, 05/26/2015, 06/13/2016  . Pneumococcal  Polysaccharide-23 10/03/2011  . Td 10/03/2011  . Zoster 08/28/2007   Pertinent  Health Maintenance Due  Topic Date Due  . PNA vac Low Risk Adult (2 of 2 - PCV13) 10/02/2012  . INFLUENZA VACCINE  Completed  . DEXA SCAN  Completed   Fall Risk  09/15/2015 01/21/2014  Falls in the past year? No No   Functional Status Survey:    Vitals:   08/15/16 1057  BP: 128/74  Pulse: 70  Resp: 18  Temp: 98.1 F (36.7 C)  Weight: 166 lb 6.4 oz (75.5 kg)  Height: 5\' 4"  (1.626 m)   Body mass index is 28.56 kg/m. Physical Exam  Constitutional: She is oriented to person, place, and time. She appears well-developed and well-nourished. No distress.  HENT:  Hare-lip  Neck: No JVD present. No tracheal deviation present. No thyromegaly present.  Cardiovascular: Normal rate, regular rhythm and normal heart sounds.  Exam reveals no gallop and no friction rub.   No murmur heard. Pulmonary/Chest: Breath sounds normal. No respiratory distress.  Abdominal: Soft. Bowel sounds are normal. She exhibits no distension and no mass.  There is no tenderness. There is no guarding.  Genitourinary:  Genitourinary Comments: Previous problem with erythematous labia bilaterally. Now says the rash has resolved and there is no itching and burning.  Musculoskeletal: She exhibits tenderness. She exhibits no edema.  Old surgical scar at the left knee. Lower back. R+L SIJ pain travels to posterior thighs and knees. Positional.   Lymphadenopathy:    She has no cervical adenopathy.  Neurological: She is alert and oriented to person, place, and time. No cranial nerve deficit.  09/15/15 MMSE 25/30. Passed clock drawing.  Skin: No rash noted. No erythema. No pallor.  Multiple scattered seborrheic keratoses. Dry skin.  Psychiatric: She has a normal mood and affect. Her behavior is normal. Judgment and thought content normal.    Labs reviewed:  Recent Labs  09/06/15 04/24/16  NA 140 140  K 4.2 4.1  BUN 26* 17  CREATININE  0.7 0.8    Recent Labs  09/06/15 04/24/16  AST 17 17  ALT 15 16  ALKPHOS 87 84    Recent Labs  09/06/15 04/24/16  WBC 7.0 5.0  HGB 15.7 10.9*  HCT 45 34*  PLT 198 234   Lab Results  Component Value Date   TSH 1.10 04/24/2016   Lab Results  Component Value Date   HGBA1C 5.6 04/24/2016   Lab Results  Component Value Date   CHOL 189 09/06/2015   HDL 65 09/06/2015   LDLCALC 99 09/06/2015   TRIG 126 09/06/2015    Significant Diagnostic Results in last 30 days:  No results found.  Assessment/Plan Duodenal ulcer without hemorrhage or perforation CT abd/pelvis 7/15/1Apparent focal erosion involving the anterior wall of the distal gastric antrum, which may reflect a focal gastric ulceration. No free air seen to suggest significant perforation. A large amount of associated soft tissue inflammation is noted about the antrum of the stomach and pylorus, extending to the adjacent gallbladder and pancreatic head. Better since  Pantoprazole 40mg  bid and Sucralfate 1gm qid. 04/24/16 Hgb 10.9, Na 140, K 4.1, Bun 17, creat 0.83, TSH 1.10, Hgb a1c 5.6     Constipation Stable, continue Dulcolax.    Benign paroxysmal positional vertigo Stable, continue prn Meclizine.    Osteoarthritis of right knee S/p TKR 2013, no apparent deformity, redness, swelling, contiue Lidoderm patch on 12h and off 12h. Continue Norco prn.    Stress incontinence Continue Oxybutynin, Myrbetriq, urinatioin 2-3x/night   Memory loss 04/24/16 MMSE 22/30, 04/24/16 Hgb 10.9, Na 140, K 4.1, Bun 17, creat 0.83, TSH 1.10, Hgb a1c 5.6 Continue AL for care assistance      Family/ staff Communication: AL  Labs/tests ordered: none

## 2016-08-15 NOTE — Assessment & Plan Note (Signed)
CT abd/pelvis 7/15/1Apparent focal erosion involving the anterior wall of the distal gastric antrum, which may reflect a focal gastric ulceration. No free air seen to suggest significant perforation. A large amount of associated soft tissue inflammation is noted about the antrum of the stomach and pylorus, extending to the adjacent gallbladder and pancreatic head. Better since  Pantoprazole 40mg  bid and Sucralfate 1gm qid. 04/24/16 Hgb 10.9, Na 140, K 4.1, Bun 17, creat 0.83, TSH 1.10, Hgb a1c 5.6

## 2016-08-15 NOTE — Assessment & Plan Note (Signed)
Stable, continue Dulcolax.

## 2016-08-16 DIAGNOSIS — R32 Unspecified urinary incontinence: Secondary | ICD-10-CM | POA: Diagnosis not present

## 2016-08-17 ENCOUNTER — Other Ambulatory Visit: Payer: Self-pay | Admitting: *Deleted

## 2016-08-17 MED ORDER — HYDROCODONE-ACETAMINOPHEN 7.5-325 MG PO TABS: ORAL_TABLET | ORAL | 0 refills | Status: DC

## 2016-08-17 NOTE — Telephone Encounter (Signed)
[facility]

## 2016-08-21 ENCOUNTER — Other Ambulatory Visit: Payer: Self-pay | Admitting: *Deleted

## 2016-08-21 MED ORDER — HYDROCODONE-ACETAMINOPHEN 7.5-325 MG PO TABS
ORAL_TABLET | ORAL | 0 refills | Status: DC
Start: 2016-08-21 — End: 2016-10-15

## 2016-08-21 NOTE — Telephone Encounter (Signed)
[facility] [phone]

## 2016-08-23 DIAGNOSIS — I10 Essential (primary) hypertension: Secondary | ICD-10-CM | POA: Diagnosis not present

## 2016-08-23 DIAGNOSIS — N393 Stress incontinence (female) (male): Secondary | ICD-10-CM | POA: Diagnosis not present

## 2016-08-23 DIAGNOSIS — R7309 Other abnormal glucose: Secondary | ICD-10-CM | POA: Diagnosis not present

## 2016-08-23 LAB — BASIC METABOLIC PANEL
BUN: 14 mg/dL (ref 4–21)
Creatinine: 0.9 mg/dL (ref <=1.1)
GLUCOSE: 98 mg/dL
POTASSIUM: 4.4 mmol/L (ref 3.4–5.3)
Sodium: 139 mmol/L (ref 137–147)

## 2016-08-23 LAB — HEPATIC FUNCTION PANEL
ALT: 12 U/L (ref 7–35)
AST: 15 U/L (ref 13–35)
Alkaline Phosphatase: 92 U/L (ref 25–125)
Bilirubin, Total: 0.4 mg/dL

## 2016-08-29 ENCOUNTER — Other Ambulatory Visit: Payer: Self-pay | Admitting: *Deleted

## 2016-08-30 ENCOUNTER — Encounter: Payer: Self-pay | Admitting: Internal Medicine

## 2016-09-04 DIAGNOSIS — I10 Essential (primary) hypertension: Secondary | ICD-10-CM | POA: Diagnosis not present

## 2016-09-05 DIAGNOSIS — M25561 Pain in right knee: Secondary | ICD-10-CM | POA: Diagnosis not present

## 2016-09-05 DIAGNOSIS — G8929 Other chronic pain: Secondary | ICD-10-CM | POA: Diagnosis not present

## 2016-09-05 DIAGNOSIS — Z96652 Presence of left artificial knee joint: Secondary | ICD-10-CM | POA: Diagnosis not present

## 2016-09-13 ENCOUNTER — Encounter: Payer: Medicare Other | Admitting: Internal Medicine

## 2016-09-27 DIAGNOSIS — R350 Frequency of micturition: Secondary | ICD-10-CM | POA: Diagnosis not present

## 2016-09-27 DIAGNOSIS — R32 Unspecified urinary incontinence: Secondary | ICD-10-CM | POA: Diagnosis not present

## 2016-10-11 DIAGNOSIS — R350 Frequency of micturition: Secondary | ICD-10-CM | POA: Diagnosis not present

## 2016-10-11 DIAGNOSIS — N3941 Urge incontinence: Secondary | ICD-10-CM | POA: Diagnosis not present

## 2016-10-12 DIAGNOSIS — Z96652 Presence of left artificial knee joint: Secondary | ICD-10-CM | POA: Diagnosis not present

## 2016-10-15 ENCOUNTER — Other Ambulatory Visit: Payer: Self-pay

## 2016-10-15 MED ORDER — HYDROCODONE-ACETAMINOPHEN 7.5-325 MG PO TABS: ORAL_TABLET | ORAL | 0 refills | Status: DC

## 2016-10-15 NOTE — Telephone Encounter (Signed)
Refill request from [facility] for hydrocodone/apap 7.5-325 mg tablet. #60 no RF.  Take 1 tablet by mouth two times a day as needed for pain.   Refill was printed and attached to request for.   [facility] [address] [place] [address] Phone: [phone] Fax: [phone]

## 2016-10-18 DIAGNOSIS — R350 Frequency of micturition: Secondary | ICD-10-CM | POA: Diagnosis not present

## 2016-10-18 DIAGNOSIS — N3941 Urge incontinence: Secondary | ICD-10-CM | POA: Diagnosis not present

## 2016-10-25 DIAGNOSIS — N3941 Urge incontinence: Secondary | ICD-10-CM | POA: Diagnosis not present

## 2016-10-25 DIAGNOSIS — R350 Frequency of micturition: Secondary | ICD-10-CM | POA: Diagnosis not present

## 2016-11-01 DIAGNOSIS — R350 Frequency of micturition: Secondary | ICD-10-CM | POA: Diagnosis not present

## 2016-11-01 DIAGNOSIS — N3941 Urge incontinence: Secondary | ICD-10-CM | POA: Diagnosis not present

## 2016-11-08 DIAGNOSIS — R350 Frequency of micturition: Secondary | ICD-10-CM | POA: Diagnosis not present

## 2016-11-08 DIAGNOSIS — N3941 Urge incontinence: Secondary | ICD-10-CM | POA: Diagnosis not present

## 2016-11-12 ENCOUNTER — Other Ambulatory Visit: Payer: Self-pay | Admitting: *Deleted

## 2016-11-12 MED ORDER — HYDROCODONE-ACETAMINOPHEN 7.5-325 MG PO TABS: ORAL_TABLET | ORAL | 0 refills | Status: DC

## 2016-11-12 MED ORDER — HYDROCODONE-ACETAMINOPHEN 7.5-325 MG PO TABS
ORAL_TABLET | ORAL | 0 refills | Status: DC
Start: 2016-11-12 — End: 2017-01-11

## 2016-11-12 NOTE — Telephone Encounter (Signed)
[facility] #[phone] Fax: [phone]  

## 2016-11-15 DIAGNOSIS — N3941 Urge incontinence: Secondary | ICD-10-CM | POA: Diagnosis not present

## 2016-11-15 DIAGNOSIS — R350 Frequency of micturition: Secondary | ICD-10-CM | POA: Diagnosis not present

## 2016-11-21 ENCOUNTER — Non-Acute Institutional Stay: Payer: Medicare Other | Admitting: Nurse Practitioner

## 2016-11-21 ENCOUNTER — Encounter: Payer: Self-pay | Admitting: Nurse Practitioner

## 2016-11-21 DIAGNOSIS — G4700 Insomnia, unspecified: Secondary | ICD-10-CM

## 2016-11-21 DIAGNOSIS — N393 Stress incontinence (female) (male): Secondary | ICD-10-CM

## 2016-11-21 DIAGNOSIS — K5900 Constipation, unspecified: Secondary | ICD-10-CM

## 2016-11-21 DIAGNOSIS — R413 Other amnesia: Secondary | ICD-10-CM | POA: Diagnosis not present

## 2016-11-21 DIAGNOSIS — H8110 Benign paroxysmal vertigo, unspecified ear: Secondary | ICD-10-CM | POA: Diagnosis not present

## 2016-11-21 DIAGNOSIS — M1711 Unilateral primary osteoarthritis, right knee: Secondary | ICD-10-CM | POA: Diagnosis not present

## 2016-11-21 DIAGNOSIS — K257 Chronic gastric ulcer without hemorrhage or perforation: Secondary | ICD-10-CM | POA: Diagnosis not present

## 2016-11-21 NOTE — Assessment & Plan Note (Signed)
Prn Antivert.

## 2016-11-21 NOTE — Assessment & Plan Note (Signed)
S/p TKR 2013, no apparent deformity, redness, swelling, contiue Lidoderm patch on 12h and off 12h. Continue Norco prn, Tramadol

## 2016-11-21 NOTE — Assessment & Plan Note (Signed)
04/24/16 MMSE 22/30 Continue AL for care assistance

## 2016-11-21 NOTE — Assessment & Plan Note (Signed)
Continue Oxybutynin, Myrbetriq, urinatioin 2-3x/night

## 2016-11-21 NOTE — Assessment & Plan Note (Signed)
No problem.

## 2016-11-21 NOTE — Assessment & Plan Note (Signed)
Stable, continue Dulcolax 5mg  po qd

## 2016-11-21 NOTE — Progress Notes (Signed)
Location:  [facility] Room Number: 725 Place of Service:  ALF [phone]) Provider:  Kiernan Farkas, [name]  NP  [name], MD  Patient Care Team: [name], MD as PCP - General (Internal Medicine) [place] Shruti Arrey [name], NP as Nurse Practitioner (Nurse Practitioner) [name], MD as Attending Physician (Urology) [name], MD as Consulting Physician (Gastroenterology)  Extended Emergency Contact Information Primary Emergency Contact: Hedgepath,[name] Address: [place] [address] [name] of [place] Phone: [phone] Relation: Son  Code Status:  DNR Goals of care: Advanced Directive information Advanced Directives 11/21/2016  Does Patient Have a Medical Advance Directive? Yes  Type of Advance Directive Living will  Does patient want to make changes to medical advance directive? No - Patient declined  Copy of [place] in Chart? -  Pre-existing out of facility DNR order (yellow form or pink MOST form) -     Chief Complaint  Patient presents with  . Medical Management of Chronic Issues    HPI:  Pt is a 83 y.o. female seen today for medical management of chronic diseases.     Hx of GERD, stable on Protonix and Carafate. No constipation while on DulcoLax 5mg  po qd.  Overactive bladder, controlled on Oxybutynin and Myrbetriq, urination x2/night. Chronic back pain is managed with prn Norco bid, Tramadol.   Past Medical History:  Diagnosis Date  . Allergic rhinitis due to pollen 07/01/2009  . Anemia, unspecified 09/07/2010  . Arthritis   . Benign paroxysmal positional vertigo   . Candidiasis 01/01/2012  . Cramp of limb 01/11/2011  . Disorder of bone and cartilage, unspecified 11/10/2010  . Dizziness and giddiness 11/10/2010  . Insomnia, unspecified 09/07/2010  . Knee pain, left anterior   . Lumbago 07/01/2009  . Lump or mass in breast 08/01/2012  . Memory loss 01/22/2012  . Myalgia and  myositis, unspecified 05/17/2011  . Myopathy, unspecified   . Osteoarthrosis, unspecified whether generalized or localized, lower leg   . Other abnormal glucose 03/23/2010  . Other chest pain 08/28/2012  . Other disorders of bone and cartilage(733.99) 09/19/2012  . Overactive bladder   . Rash    inner legs- ? med allergy- to see PCP if doesnt improve by end of week  . Rash and nonspecific skin eruption 10/28/2014  . Right bundle branch block   . Seborrheic keratoses 05/17/2011  . Spasm of muscle 01/11/2011  . Spinal stenosis, lumbar region, without neurogenic claudication   . Stress incontinence 07/08/2014  . Unspecified constipation 12/18/2011  . Unspecified tinnitus   . Unspecified urinary incontinence   . Urgency of urination   . Urinary frequency 09/18/2012   Past Surgical History:  Procedure Laterality Date  . ABDOMINAL HYSTERECTOMY    . APPENDECTOMY  1956  . BACK SURGERY     lumbar-- ? type  Dr Tonita Cong  . BREAST LUMPECTOMY  1993   breast cyst  . BREAST SURGERY     right lumpectomy  . CLEFT LIP REPAIR     with repair palate  . ESOPHAGOGASTRODUODENOSCOPY Left 03/10/2013   Procedure: ESOPHAGOGASTRODUODENOSCOPY (EGD);  Surgeon: Beryle Beams, MD;  Location: [facility];  Service: Endoscopy;  Laterality: Left;  . EYE SURGERY     bilateral cataract extraction with IOL  . TOTAL KNEE ARTHROPLASTY  12/13/2011   Procedure: TOTAL KNEE ARTHROPLASTY;  Surgeon: Johnn Hai, MD;  Location: [facility];  Service:  Orthopedics;  Laterality: Left;    No Known Allergies  Allergies as of 11/21/2016   No Known Allergies     Medication List       Accurate as of 11/21/16  2:44 PM. Always use your most recent med list.          bisacodyl 5 MG EC tablet Commonly known as:  DULCOLAX Take 5 mg by mouth. Take one daily for constipation   GELNIQUE 10 % Gel Generic drug:  Oxybutynin Chloride Place onto the skin. Apply 1 gm to arms, abdomin, thigh daily   HYDROcodone-acetaminophen 7.5-325 MG  tablet Commonly known as:  NORCO Take one tablet by mouth two times a day as needed for pain   meclizine 25 MG tablet Commonly known as:  ANTIVERT Take 25 mg by mouth 3 (three) times daily as needed. Vertigo   MYRBETRIQ 50 MG Tb24 tablet Generic drug:  mirabegron ER   pantoprazole 40 MG tablet Commonly known as:  PROTONIX Take 1 tablet (40 mg total) by mouth 2 (two) times daily.   sucralfate 1 g tablet Commonly known as:  CARAFATE Take 1 g by mouth. Take twice daily to protect lining of stomach and esophagus.   traMADol 50 MG tablet Commonly known as:  ULTRAM       Review of Systems  Constitutional: Negative for chills, diaphoresis and fever.  HENT: Positive for hearing loss. Negative for congestion, ear discharge, ear pain, nosebleeds, sore throat and tinnitus.   Eyes: Negative for photophobia, pain, discharge and redness.  Respiratory: Negative for shortness of breath, wheezing and stridor.   Cardiovascular: Negative for chest pain, palpitations and leg swelling.  Gastrointestinal: Negative for abdominal pain, blood in stool, constipation, diarrhea, nausea and vomiting.       Previous problems of gastric erosions. Asymptomatic at this time. Remains on pantoprazole.  Endocrine: Negative for polydipsia.  Genitourinary: Positive for frequency. Negative for dysuria, flank pain, hematuria and urgency.  Musculoskeletal: Positive for back pain and gait problem. Negative for myalgias and neck pain.       Chronic pains in both knees and down the left hip.  Skin: Negative for rash.  Allergic/Immunologic: Negative for environmental allergies.  Neurological: Negative for dizziness, tremors, seizures, weakness and headaches.  Hematological: Does not bruise/bleed easily.  Psychiatric/Behavioral: Negative for hallucinations. The patient is not nervous/anxious.     Immunization History  Administered Date(s) Administered  . Influenza Whole 05/27/2012, 06/10/2013  .  Influenza-Unspecified 06/14/2014, 05/26/2015, 06/13/2016  . Pneumococcal Polysaccharide-23 10/03/2011  . Td 10/03/2011  . Zoster 08/28/2007   Pertinent  Health Maintenance Due  Topic Date Due  . PNA vac Low Risk Adult (2 of 2 - PCV13) 10/02/2012  . INFLUENZA VACCINE  Completed  . DEXA SCAN  Completed   Fall Risk  09/15/2015 01/21/2014  Falls in the past year? No No   Functional Status Survey:    Vitals:   11/21/16 1421  BP: 130/80  Pulse: 72  Resp: 20  Temp: 97.3 F (36.3 C)  Weight: 169 lb 6.4 oz (76.8 kg)  Height: 5\' 4"  (1.626 m)   Body mass index is 29.08 kg/m. Physical Exam  Constitutional: She is oriented to person, place, and time. She appears well-developed and well-nourished. No distress.  HENT:  Hare-lip  Neck: No JVD present. No tracheal deviation present. No thyromegaly present.  Cardiovascular: Normal rate, regular rhythm and normal heart sounds.  Exam reveals no gallop and no friction rub.   No murmur heard. Pulmonary/Chest: Breath  sounds normal. No respiratory distress.  Abdominal: Soft. Bowel sounds are normal. She exhibits no distension and no mass. There is no tenderness. There is no guarding.  Genitourinary:  Genitourinary Comments: Previous problem with erythematous labia bilaterally. Now says the rash has resolved and there is no itching and burning.  Musculoskeletal: She exhibits tenderness. She exhibits no edema.  Old surgical scar at the left knee. Lower back. R+L SIJ pain travels to posterior thighs and knees. Positional.   Lymphadenopathy:    She has no cervical adenopathy.  Neurological: She is alert and oriented to person, place, and time. No cranial nerve deficit.  09/15/15 MMSE 25/30. Passed clock drawing.  Skin: No rash noted. No erythema. No pallor.  Multiple scattered seborrheic keratoses. Dry skin.  Psychiatric: She has a normal mood and affect. Her behavior is normal. Judgment and thought content normal.    Labs reviewed:  Recent  Labs  04/24/16 08/23/16  NA 140 139  K 4.1 4.4  BUN 17 14  CREATININE 0.8 0.9    Recent Labs  04/24/16 08/23/16  AST 17 15  ALT 16 12  ALKPHOS 84 92    Recent Labs  04/24/16  WBC 5.0  HGB 10.9*  HCT 34*  PLT 234   Lab Results  Component Value Date   TSH 1.10 04/24/2016   Lab Results  Component Value Date   HGBA1C 5.6 04/24/2016   Lab Results  Component Value Date   CHOL 189 09/06/2015   HDL 65 09/06/2015   LDLCALC 99 09/06/2015   TRIG 126 09/06/2015    Significant Diagnostic Results in last 30 days:  No results found.  Assessment/Plan Gastric erosions Stable, continue Pantoprazole 40mg  bid and Sucralfate 1gm qid  Constipation Stable, continue Dulcolax 5mg  po qd  Benign paroxysmal positional vertigo Prn Antivert.   Osteoarthritis of right knee S/p TKR 2013, no apparent deformity, redness, swelling, contiue Lidoderm patch on 12h and off 12h. Continue Norco prn, Tramadol   Insomnia No problem.   Memory loss 04/24/16 MMSE 22/30 Continue AL for care assistance    Stress incontinence Continue Oxybutynin, Myrbetriq, urinatioin 2-3x/night     Family/ staff Communication: AL  Labs/tests ordered: none

## 2016-11-21 NOTE — Assessment & Plan Note (Signed)
Stable, continue Pantoprazole 40mg  bid and Sucralfate 1gm qid

## 2016-11-22 DIAGNOSIS — N3941 Urge incontinence: Secondary | ICD-10-CM | POA: Diagnosis not present

## 2016-11-29 DIAGNOSIS — N3941 Urge incontinence: Secondary | ICD-10-CM | POA: Diagnosis not present

## 2016-12-06 DIAGNOSIS — N3941 Urge incontinence: Secondary | ICD-10-CM | POA: Diagnosis not present

## 2016-12-20 DIAGNOSIS — N3941 Urge incontinence: Secondary | ICD-10-CM | POA: Diagnosis not present

## 2016-12-28 DIAGNOSIS — R350 Frequency of micturition: Secondary | ICD-10-CM | POA: Diagnosis not present

## 2016-12-28 DIAGNOSIS — N3941 Urge incontinence: Secondary | ICD-10-CM | POA: Diagnosis not present

## 2017-01-10 ENCOUNTER — Encounter: Payer: Self-pay | Admitting: Nurse Practitioner

## 2017-01-10 ENCOUNTER — Non-Acute Institutional Stay: Payer: Medicare Other | Admitting: Nurse Practitioner

## 2017-01-10 DIAGNOSIS — N393 Stress incontinence (female) (male): Secondary | ICD-10-CM

## 2017-01-10 DIAGNOSIS — K269 Duodenal ulcer, unspecified as acute or chronic, without hemorrhage or perforation: Secondary | ICD-10-CM | POA: Diagnosis not present

## 2017-01-10 DIAGNOSIS — R413 Other amnesia: Secondary | ICD-10-CM | POA: Diagnosis not present

## 2017-01-10 DIAGNOSIS — M1711 Unilateral primary osteoarthritis, right knee: Secondary | ICD-10-CM | POA: Diagnosis not present

## 2017-01-10 DIAGNOSIS — M5440 Lumbago with sciatica, unspecified side: Secondary | ICD-10-CM

## 2017-01-10 DIAGNOSIS — K5900 Constipation, unspecified: Secondary | ICD-10-CM | POA: Diagnosis not present

## 2017-01-10 DIAGNOSIS — H8110 Benign paroxysmal vertigo, unspecified ear: Secondary | ICD-10-CM | POA: Diagnosis not present

## 2017-01-10 NOTE — Assessment & Plan Note (Addendum)
08/30/15 Tramadol 50mg  bid, Ortho consult.  01/10/17 continue Norco 7.5/325mg  bid and bid prn, dc Tramadol-family concerned about the resident being over medicated. Observe.

## 2017-01-10 NOTE — Progress Notes (Signed)
Location:  [facility] Room Number: 073 Place of Service:  ALF [phone]) Provider:  Marlana Latus NP  [name], MD  Patient Care Team: [name], MD as PCP - General (Internal Medicine) [place] Denesha Brouse X, NP as Nurse Practitioner (Nurse Practitioner) [name], MD as Attending Physician (Urology) [name], MD as Consulting Physician (Gastroenterology)  Extended Emergency Contact Information Primary Emergency Contact: [place],[name] Address: [place] [address] [name] of [place] Phone: [phone] Relation: Son  Code Status:  Full Code Goals of care: Advanced Directive information Advanced Directives 01/10/2017  Does Patient Have a Medical Advance Directive? Yes  Type of Advance Directive [place]  Does patient want to make changes to medical advance directive? No - Patient declined  Copy of [place] in Chart? Yes  Pre-existing out of facility DNR order (yellow form or pink MOST form) -     Chief Complaint  Patient presents with  . Acute Visit    Medication questions    HPI:  Pt is a 83 y.o. female seen today for evaluation of her chronic medical conditions.     Hx of GERD, stable on Protonix and Carafate. No constipation while on DulcoLax 5mg  po qd.  Overactive bladder, controlled on Oxybutynin and Myrbetriq, urination x2/night. Chronic back pain is managed with Norco bid and prn, Tramadol.    Past Medical History:  Diagnosis Date  . Allergic rhinitis due to pollen 07/01/2009  . Anemia, unspecified 09/07/2010  . Arthritis   . Benign paroxysmal positional vertigo   . Candidiasis 01/01/2012  . Cramp of limb 01/11/2011  . Disorder of bone and cartilage, unspecified 11/10/2010  . Dizziness and giddiness 11/10/2010  . Insomnia, unspecified 09/07/2010  . Knee pain, left anterior   . Lumbago 07/01/2009  . Lump or mass in breast 08/01/2012  .  Memory loss 01/22/2012  . Myalgia and myositis, unspecified 05/17/2011  . Myopathy, unspecified   . Osteoarthrosis, unspecified whether generalized or localized, lower leg   . Other abnormal glucose 03/23/2010  . Other chest pain 08/28/2012  . Other disorders of bone and cartilage(733.99) 09/19/2012  . Overactive bladder   . Rash    inner legs- ? med allergy- to see PCP if doesnt improve by end of week  . Rash and nonspecific skin eruption 10/28/2014  . Right bundle branch block   . Seborrheic keratoses 05/17/2011  . Spasm of muscle 01/11/2011  . Spinal stenosis, lumbar region, without neurogenic claudication   . Stress incontinence 07/08/2014  . Unspecified constipation 12/18/2011  . Unspecified tinnitus   . Unspecified urinary incontinence   . Urgency of urination   . Urinary frequency 09/18/2012   Past Surgical History:  Procedure Laterality Date  . ABDOMINAL HYSTERECTOMY    . APPENDECTOMY  1956  . BACK SURGERY     lumbar-- ? type  Dr Tonita Cong  . BREAST LUMPECTOMY  1993   breast cyst  . BREAST SURGERY     right lumpectomy  . CLEFT LIP REPAIR     with repair palate  . ESOPHAGOGASTRODUODENOSCOPY Left 03/10/2013   Procedure: ESOPHAGOGASTRODUODENOSCOPY (EGD);  Surgeon: Beryle Beams, MD;  Location: [facility];  Service: Endoscopy;  Laterality: Left;  . EYE SURGERY     bilateral cataract extraction with IOL  . TOTAL KNEE ARTHROPLASTY  12/13/2011   Procedure: TOTAL KNEE ARTHROPLASTY;  Surgeon: Doroteo Bradford  Tonita Cong, MD;  Location: [facility];  Service: Orthopedics;  Laterality: Left;    No Known Allergies  Outpatient Encounter Prescriptions as of 01/10/2017  Medication Sig  . bisacodyl (DULCOLAX) 5 MG EC tablet Take 5 mg by mouth. Take one daily for constipation  . meclizine (ANTIVERT) 25 MG tablet Take 25 mg by mouth 3 (three) times daily as needed. Vertigo   . MYRBETRIQ 50 MG TB24 tablet   . pantoprazole (PROTONIX) 40 MG tablet Take 1 tablet (40 mg total) by mouth 2 (two) times daily.  .  sucralfate (CARAFATE) 1 G tablet Take 1 g by mouth. Take twice daily to protect lining of stomach and esophagus.  . traMADol (ULTRAM) 50 MG tablet   . [DISCONTINUED] HYDROcodone-acetaminophen (NORCO) 7.5-325 MG tablet Take one tablet by mouth two times a day as needed for pain  . [DISCONTINUED] Oxybutynin Chloride (GELNIQUE) 10 % GEL Place onto the skin. Apply 1 gm to arms, abdomin, thigh daily   No facility-administered encounter medications on file as of 01/10/2017.     Review of Systems  Constitutional: Negative for chills, diaphoresis and fever.  HENT: Positive for hearing loss. Negative for congestion, ear discharge, ear pain, nosebleeds, sore throat and tinnitus.   Eyes: Negative for photophobia, pain, discharge and redness.  Respiratory: Negative for shortness of breath, wheezing and stridor.   Cardiovascular: Negative for chest pain, palpitations and leg swelling.  Gastrointestinal: Negative for abdominal pain, blood in stool, constipation, diarrhea, nausea and vomiting.       Previous problems of gastric erosions. Asymptomatic at this time. Remains on pantoprazole.  Endocrine: Negative for polydipsia.  Genitourinary: Positive for frequency. Negative for dysuria, flank pain, hematuria and urgency.  Musculoskeletal: Positive for back pain and gait problem. Negative for myalgias and neck pain.       Chronic pains in both knees and down the left hip.  Skin: Negative for rash.  Allergic/Immunologic: Negative for environmental allergies.  Neurological: Negative for dizziness, tremors, seizures, weakness and headaches.  Hematological: Does not bruise/bleed easily.  Psychiatric/Behavioral: Negative for hallucinations. The patient is not nervous/anxious.     Immunization History  Administered Date(s) Administered  . Influenza Whole 05/27/2012, 06/10/2013  . Influenza-Unspecified 06/14/2014, 05/26/2015, 06/13/2016  . Pneumococcal Polysaccharide-23 10/03/2011  . Td 10/03/2011  . Zoster  08/28/2007   Pertinent  Health Maintenance Due  Topic Date Due  . PNA vac Low Risk Adult (2 of 2 - PCV13) 10/02/2012  . INFLUENZA VACCINE  03/27/2017  . DEXA SCAN  Completed   Fall Risk  09/15/2015 01/21/2014  Falls in the past year? No No   Functional Status Survey:    Vitals:   01/10/17 1145  BP: (!) 142/80  Pulse: 80  Resp: 18  Temp: 98.1 F (36.7 C)  Weight: 169 lb 9.6 oz (76.9 kg)  Height: 5\' 4"  (1.626 m)   Body mass index is 29.11 kg/m. Physical Exam  Constitutional: She appears well-developed and well-nourished. No distress.  HENT:  Hare-lip  Neck: No JVD present. No tracheal deviation present. No thyromegaly present.  Cardiovascular: Normal rate, regular rhythm and normal heart sounds.  Exam reveals no gallop and no friction rub.   No murmur heard. Pulmonary/Chest: Breath sounds normal. No respiratory distress.  Abdominal: Soft. Bowel sounds are normal. She exhibits no distension and no mass. There is no tenderness. There is no guarding.  Genitourinary:  Genitourinary Comments: Previous problem with erythematous labia bilaterally. Now says the rash has resolved and there is no itching and  burning.  Musculoskeletal: She exhibits tenderness. She exhibits no edema.  Old surgical scar at the left knee. Lower back. R+L SIJ pain travels to posterior thighs and knees. Positional.   Lymphadenopathy:    She has no cervical adenopathy.  Neurological: She is alert. No cranial nerve deficit.  09/15/15 MMSE 25/30. Passed clock drawing.  Skin: No rash noted. No erythema. No pallor.  Multiple scattered seborrheic keratoses. Dry skin.  Psychiatric: She has a normal mood and affect. Her behavior is normal. Judgment and thought content normal.    Labs reviewed:  Recent Labs  04/24/16 08/23/16 01/15/17  NA 140 139 139  K 4.1 4.4 4.0  BUN 17 14 14   CREATININE 0.8 0.9 0.9    Recent Labs  04/24/16 08/23/16 01/15/17  AST 17 15 14   ALT 16 12 12   ALKPHOS 84 92 112     Recent Labs  04/24/16 01/15/17  WBC 5.0 6.8  HGB 10.9* 12.6  HCT 34* 37  PLT 234 255   Lab Results  Component Value Date   TSH 0.95 01/15/2017   Lab Results  Component Value Date   HGBA1C 5.6 04/24/2016   Lab Results  Component Value Date   CHOL 189 09/06/2015   HDL 65 09/06/2015   LDLCALC 99 09/06/2015   TRIG 126 09/06/2015    Significant Diagnostic Results in last 30 days:  No results found.  Assessment/Plan Osteoarthritis of right knee 08/30/15 Tramadol 50mg  bid, Ortho consult.  01/10/17 continue Norco 7.5/325mg  bid and bid prn, dc Tramadol-family concerned about the resident being over medicated. Observe.    Memory loss 04/24/16 MMSE 22/30 Continue AL for care assistance Update CBC CMP TSH  Stress incontinence Continue Myrbetriq, urinatioin 2-3x/night, off Oxybutynin since 08/22/16 per pharm recommendation .  Duodenal ulcer without hemorrhage or perforation Stable, continue Pantoprazole 40mg  bid and Sucralfate 1gm qid  Constipation Stable, continue Dulcolax 5mg  po qd  Benign paroxysmal positional vertigo Stable, continue prn Meclizine.   Lumbago Chronic, Continue Norco bid and  prn    Family/ staff Communication: AL  Labs/tests ordered:  none

## 2017-01-10 NOTE — Assessment & Plan Note (Addendum)
Continue Myrbetriq, urinatioin 2-3x/night, off Oxybutynin since 08/22/16 per pharm recommendation .

## 2017-01-10 NOTE — Assessment & Plan Note (Signed)
04/24/16 MMSE 22/30 Continue AL for care assistance Update CBC CMP TSH

## 2017-01-10 NOTE — Progress Notes (Signed)
Location:  [place] Room Number: 678 Place of Service:  ALF [phone]) Provider:  Thamar Holik, [name]  NP  [name], MD  Patient Care Team: [name], MD as PCP - General (Internal Medicine) [place] Ingrid Shifrin X, NP as Nurse Practitioner (Nurse Practitioner) [name], MD as Attending Physician (Urology) [name], MD as Consulting Physician (Gastroenterology)  Extended Emergency Contact Information Primary Emergency Contact: Houchen,[name] Address: [place] [address] [name] [place] Phone: [phone] Relation: Son  Code Status:  DNR Goals of care: Advanced Directive information Advanced Directives 01/10/2017  Does Patient Have a Medical Advance Directive? Yes  Type of Advance Directive [place]  Does patient want to make changes to medical advance directive? No - Patient declined  Copy of [place] in Chart? Yes  Pre-existing out of facility DNR order (yellow form or pink MOST form) -     Chief Complaint  Patient presents with  . Acute Visit    Medication questions    HPI:  Pt is a 83 y.o. female seen today for medical management of chronic diseases.     Hx of GERD, stable on Protonix and Carafate. No constipation while on DulcoLax 5mg  po qd.  Overactive bladder, controlled on Myrbetriq, urination x2/night. Chronic back pain is managed with Norco bid and bid prn.  Past Medical History:  Diagnosis Date  . Allergic rhinitis due to pollen 07/01/2009  . Anemia, unspecified 09/07/2010  . Arthritis   . Benign paroxysmal positional vertigo   . Candidiasis 01/01/2012  . Cramp of limb 01/11/2011  . Disorder of bone and cartilage, unspecified 11/10/2010  . Dizziness and giddiness 11/10/2010  . Insomnia, unspecified 09/07/2010  . Knee pain, left anterior   . Lumbago 07/01/2009  . Lump or mass in breast 08/01/2012  . Memory loss 01/22/2012  . Myalgia  and myositis, unspecified 05/17/2011  . Myopathy, unspecified   . Osteoarthrosis, unspecified whether generalized or localized, lower leg   . Other abnormal glucose 03/23/2010  . Other chest pain 08/28/2012  . Other disorders of bone and cartilage(733.99) 09/19/2012  . Overactive bladder   . Rash    inner legs- ? med allergy- to see PCP if doesnt improve by end of week  . Rash and nonspecific skin eruption 10/28/2014  . Right bundle branch block   . Seborrheic keratoses 05/17/2011  . Spasm of muscle 01/11/2011  . Spinal stenosis, lumbar region, without neurogenic claudication   . Stress incontinence 07/08/2014  . Unspecified constipation 12/18/2011  . Unspecified tinnitus   . Unspecified urinary incontinence   . Urgency of urination   . Urinary frequency 09/18/2012   Past Surgical History:  Procedure Laterality Date  . ABDOMINAL HYSTERECTOMY    . APPENDECTOMY  1956  . BACK SURGERY     lumbar-- ? type  Dr Tonita Cong  . BREAST LUMPECTOMY  1993   breast cyst  . BREAST SURGERY     right lumpectomy  . CLEFT LIP REPAIR     with repair palate  . ESOPHAGOGASTRODUODENOSCOPY Left 03/10/2013   Procedure: ESOPHAGOGASTRODUODENOSCOPY (EGD);  Surgeon: Beryle Beams, MD;  Location: [facility];  Service: Endoscopy;  Laterality: Left;  . EYE SURGERY     bilateral cataract extraction with IOL  . TOTAL KNEE ARTHROPLASTY  12/13/2011   Procedure: TOTAL KNEE ARTHROPLASTY;  Surgeon: Johnn Hai, MD;  Location: [facility];  Service: Orthopedics;  Laterality: Left;    No Known Allergies  Allergies as of 01/10/2017   No Known Allergies     Medication List       Accurate as of 01/10/17 11:59 PM. Always use your most recent med list.          bisacodyl 5 MG EC tablet Commonly known as:  DULCOLAX Take 5 mg by mouth. Take one daily for constipation   HYDROcodone-acetaminophen 7.5-325 MG tablet Commonly known as:  NORCO Take one tablet by mouth two times a day as needed for pain   meclizine 25 MG  tablet Commonly known as:  ANTIVERT Take 25 mg by mouth 3 (three) times daily as needed. Vertigo   MYRBETRIQ 50 MG Tb24 tablet Generic drug:  mirabegron ER   pantoprazole 40 MG tablet Commonly known as:  PROTONIX Take 1 tablet (40 mg total) by mouth 2 (two) times daily.   sucralfate 1 g tablet Commonly known as:  CARAFATE Take 1 g by mouth. Take twice daily to protect lining of stomach and esophagus.   traMADol 50 MG tablet Commonly known as:  ULTRAM       Review of Systems  Constitutional: Negative for chills, diaphoresis and fever.  HENT: Positive for hearing loss. Negative for congestion, ear discharge, ear pain, nosebleeds, sore throat and tinnitus.   Eyes: Negative for photophobia, pain, discharge and redness.  Respiratory: Negative for shortness of breath, wheezing and stridor.   Cardiovascular: Negative for chest pain, palpitations and leg swelling.  Gastrointestinal: Negative for abdominal pain, blood in stool, constipation, diarrhea, nausea and vomiting.       Previous problems of gastric erosions. Asymptomatic at this time. Remains on pantoprazole.  Endocrine: Negative for polydipsia.  Genitourinary: Positive for frequency. Negative for dysuria, flank pain, hematuria and urgency.  Musculoskeletal: Positive for back pain and gait problem. Negative for myalgias and neck pain.       Chronic pains in both knees and down the left hip.  Skin: Negative for rash.  Allergic/Immunologic: Negative for environmental allergies.  Neurological: Negative for dizziness, tremors, seizures, weakness and headaches.  Hematological: Does not bruise/bleed easily.  Psychiatric/Behavioral: Negative for hallucinations. The patient is not nervous/anxious.     Immunization History  Administered Date(s) Administered  . Influenza Whole 05/27/2012, 06/10/2013  . Influenza-Unspecified 06/14/2014, 05/26/2015, 06/13/2016  . Pneumococcal Polysaccharide-23 10/03/2011  . Td 10/03/2011  . Zoster  08/28/2007   Pertinent  Health Maintenance Due  Topic Date Due  . PNA vac Low Risk Adult (2 of 2 - PCV13) 10/02/2012  . INFLUENZA VACCINE  03/27/2017  . DEXA SCAN  Completed   Fall Risk  09/15/2015 01/21/2014  Falls in the past year? No No   Functional Status Survey:    Vitals:   01/10/17 1145  BP: (!) 142/80  Pulse: 80  Resp: 18  Temp: 98.1 F (36.7 C)  Weight: 169 lb 9.6 oz (76.9 kg)  Height: 5\' 4"  (1.626 m)   Body mass index is 29.11 kg/m. Physical Exam  Constitutional: She appears well-developed and well-nourished. No distress.  HENT:  Hare-lip  Neck: No JVD present. No tracheal deviation present. No thyromegaly present.  Cardiovascular: Normal rate, regular rhythm and normal heart sounds.  Exam reveals no gallop and no friction rub.   No murmur heard. Pulmonary/Chest: Breath sounds normal. No respiratory distress.  Abdominal: Soft. Bowel sounds are normal. She exhibits no distension and no mass. There is no tenderness. There is no guarding.  Genitourinary:  Genitourinary Comments: Previous problem with erythematous labia bilaterally. Now says the rash has resolved and there is no itching and burning.  Musculoskeletal: She exhibits tenderness. She exhibits no edema.  Old surgical scar at the left knee. Lower back. R+L SIJ pain travels to posterior thighs and knees. Positional.   Lymphadenopathy:    She has no cervical adenopathy.  Neurological: She is alert. No cranial nerve deficit.  09/15/15 MMSE 25/30. Passed clock drawing.  Skin: No rash noted. No erythema. No pallor.  Multiple scattered seborrheic keratoses. Dry skin.  Psychiatric: She has a normal mood and affect. Her behavior is normal. Judgment and thought content normal.  Memory recall difficulty.     Labs reviewed:  Recent Labs  04/24/16 08/23/16  NA 140 139  K 4.1 4.4  BUN 17 14  CREATININE 0.8 0.9    Recent Labs  04/24/16 08/23/16  AST 17 15  ALT 16 12  ALKPHOS 84 92    Recent Labs   04/24/16  WBC 5.0  HGB 10.9*  HCT 34*  PLT 234   Lab Results  Component Value Date   TSH 1.10 04/24/2016   Lab Results  Component Value Date   HGBA1C 5.6 04/24/2016   Lab Results  Component Value Date   CHOL 189 09/06/2015   HDL 65 09/06/2015   LDLCALC 99 09/06/2015   TRIG 126 09/06/2015    Significant Diagnostic Results in last 30 days:  No results found.  Assessment/Plan Osteoarthritis of right knee 08/30/15 Tramadol 50mg  bid, Ortho consult.  01/10/17 continue Norco 7.5/325mg  bid and bid prn, dc Tramadol-family concerned about the resident being over medicated. Observe.    Memory loss 04/24/16 MMSE 22/30 Continue AL for care assistance Update CBC CMP TSH  Stress incontinence Continue Myrbetriq, urinatioin 2-3x/night, off Oxybutynin since 08/22/16 per pharm recommendation .  Duodenal ulcer without hemorrhage or perforation Stable, continue Pantoprazole 40mg  bid and Sucralfate 1gm qid  Constipation Stable, continue Dulcolax 5mg  po qd  Benign paroxysmal positional vertigo Stable, continue prn Meclizine.      Family/ staff Communication: AL  Labs/tests ordered: CBC CMP TSH

## 2017-01-10 NOTE — Assessment & Plan Note (Signed)
Stable, continue Pantoprazole 40mg  bid and Sucralfate 1gm qid

## 2017-01-10 NOTE — Assessment & Plan Note (Signed)
Stable, continue Dulcolax 5mg  po qd

## 2017-01-10 NOTE — Assessment & Plan Note (Signed)
Stable, continue prn Meclizine.

## 2017-01-11 ENCOUNTER — Other Ambulatory Visit: Payer: Self-pay | Admitting: *Deleted

## 2017-01-11 MED ORDER — HYDROCODONE-ACETAMINOPHEN 7.5-325 MG PO TABS: ORAL_TABLET | ORAL | 0 refills | Status: DC

## 2017-01-11 NOTE — Telephone Encounter (Signed)
[facility] 

## 2017-01-15 DIAGNOSIS — R739 Hyperglycemia, unspecified: Secondary | ICD-10-CM | POA: Diagnosis not present

## 2017-01-15 DIAGNOSIS — E785 Hyperlipidemia, unspecified: Secondary | ICD-10-CM | POA: Diagnosis not present

## 2017-01-15 LAB — BASIC METABOLIC PANEL
BUN: 14 mg/dL (ref 4–21)
CREATININE: 0.9 mg/dL (ref <=1.1)
Glucose: 99 mg/dL
POTASSIUM: 4 mmol/L (ref 3.4–5.3)
Sodium: 139 mmol/L (ref 137–147)

## 2017-01-15 LAB — HEPATIC FUNCTION PANEL
ALT: 12 U/L (ref 7–35)
AST: 14 U/L (ref 13–35)
Alkaline Phosphatase: 112 U/L (ref 25–125)
Bilirubin, Total: 0.5 mg/dL

## 2017-01-15 LAB — CBC AND DIFFERENTIAL
HCT: 37 % (ref 36–46)
Hemoglobin: 12.6 g/dL (ref 12.0–16.0)
PLATELETS: 255 10*3/uL (ref 150–399)
WBC: 6.8 10^3/mL

## 2017-01-15 LAB — TSH: TSH: 0.95 u[IU]/mL (ref <=5.90)

## 2017-01-16 ENCOUNTER — Other Ambulatory Visit: Payer: Self-pay | Admitting: *Deleted

## 2017-01-17 DIAGNOSIS — N3941 Urge incontinence: Secondary | ICD-10-CM | POA: Diagnosis not present

## 2017-01-23 NOTE — Assessment & Plan Note (Addendum)
Chronic, Continue Norco bid and  prn

## 2017-02-07 DIAGNOSIS — R350 Frequency of micturition: Secondary | ICD-10-CM | POA: Diagnosis not present

## 2017-02-07 DIAGNOSIS — N3941 Urge incontinence: Secondary | ICD-10-CM | POA: Diagnosis not present

## 2017-02-11 ENCOUNTER — Other Ambulatory Visit: Payer: Self-pay | Admitting: *Deleted

## 2017-02-11 MED ORDER — HYDROCODONE-ACETAMINOPHEN 7.5-325 MG PO TABS: ORAL_TABLET | ORAL | 0 refills | Status: DC

## 2017-02-11 NOTE — Telephone Encounter (Signed)
[facility] #[phone] Fax: [phone]  

## 2017-02-28 DIAGNOSIS — R350 Frequency of micturition: Secondary | ICD-10-CM | POA: Diagnosis not present

## 2017-02-28 DIAGNOSIS — N3941 Urge incontinence: Secondary | ICD-10-CM | POA: Diagnosis not present

## 2017-04-11 ENCOUNTER — Other Ambulatory Visit: Payer: Self-pay

## 2017-04-11 MED ORDER — HYDROCODONE-ACETAMINOPHEN 7.5-325 MG PO TABS: 1.0000 | ORAL_TABLET | Freq: Two times a day (BID) | ORAL | 0 refills | Status: DC

## 2017-05-14 ENCOUNTER — Other Ambulatory Visit: Payer: Self-pay

## 2017-05-14 MED ORDER — HYDROCODONE-ACETAMINOPHEN 7.5-325 MG PO TABS: 1.0000 | ORAL_TABLET | Freq: Two times a day (BID) | ORAL | 0 refills | Status: DC

## 2017-05-17 ENCOUNTER — Non-Acute Institutional Stay: Payer: Medicare Other

## 2017-05-17 DIAGNOSIS — Z0000 Encounter for general adult medical examination without abnormal findings: Secondary | ICD-10-CM | POA: Diagnosis not present

## 2017-05-17 NOTE — Progress Notes (Signed)
Subjective:   Janet Hernandez is a 83 y.o. female who presents for an Initial Medicare Annual Wellness Visit at [facility]       Objective:    Today's Vitals   05/17/17 0938  BP: 128/70  Pulse: 83  Temp: (!) 97.3 F (36.3 C)  TempSrc: Oral  SpO2: 95%  Weight: 170 lb (77.1 kg)  Height: 5\' 4"  (1.626 m)   Body mass index is 29.18 kg/m.   Current Medications (verified) Outpatient Encounter Prescriptions as of 05/17/2017  Medication Sig  . bisacodyl (DULCOLAX) 5 MG EC tablet Take 5 mg by mouth. Take one daily for constipation  . HYDROcodone-acetaminophen (NORCO) 7.5-325 MG tablet Take one tablet by mouth two times a day as needed for pain  . HYDROcodone-acetaminophen (NORCO) 7.5-325 MG tablet Take 1 tablet by mouth 2 (two) times daily.  . meclizine (ANTIVERT) 25 MG tablet Take 25 mg by mouth 3 (three) times daily as needed. Vertigo   . MYRBETRIQ 50 MG TB24 tablet   . pantoprazole (PROTONIX) 40 MG tablet Take 1 tablet (40 mg total) by mouth 2 (two) times daily.  . sucralfate (CARAFATE) 1 G tablet Take 1 g by mouth. Take twice daily to protect lining of stomach and esophagus.  . [DISCONTINUED] traMADol (ULTRAM) 50 MG tablet    No facility-administered encounter medications on file as of 05/17/2017.     Allergies (verified) Patient has no known allergies.   History: Past Medical History:  Diagnosis Date  . Allergic rhinitis due to pollen 07/01/2009  . Anemia, unspecified 09/07/2010  . Arthritis   . Benign paroxysmal positional vertigo   . Candidiasis 01/01/2012  . Cramp of limb 01/11/2011  . Disorder of bone and cartilage, unspecified 11/10/2010  . Dizziness and giddiness 11/10/2010  . Insomnia, unspecified 09/07/2010  . Knee pain, left anterior   . Lumbago 07/01/2009  . Lump or mass in breast 08/01/2012  . Memory loss 01/22/2012  . Myalgia and myositis, unspecified 05/17/2011  . Myopathy, unspecified   . Osteoarthrosis, unspecified whether  generalized or localized, lower leg   . Other abnormal glucose 03/23/2010  . Other chest pain 08/28/2012  . Other disorders of bone and cartilage(733.99) 09/19/2012  . Overactive bladder   . Rash    inner legs- ? med allergy- to see PCP if doesnt improve by end of week  . Rash and nonspecific skin eruption 10/28/2014  . Right bundle branch block   . Seborrheic keratoses 05/17/2011  . Spasm of muscle 01/11/2011  . Spinal stenosis, lumbar region, without neurogenic claudication   . Stress incontinence 07/08/2014  . Unspecified constipation 12/18/2011  . Unspecified tinnitus   . Unspecified urinary incontinence   . Urgency of urination   . Urinary frequency 09/18/2012   Past Surgical History:  Procedure Laterality Date  . ABDOMINAL HYSTERECTOMY    . APPENDECTOMY  1956  . BACK SURGERY     lumbar-- ? type  Dr Tonita Cong  . BREAST LUMPECTOMY  1993   breast cyst  . BREAST SURGERY     right lumpectomy  . CLEFT LIP REPAIR     with repair palate  . ESOPHAGOGASTRODUODENOSCOPY Left 03/10/2013   Procedure: ESOPHAGOGASTRODUODENOSCOPY (EGD);  Surgeon: Beryle Beams, MD;  Location: [facility];  Service: Endoscopy;  Laterality: Left;  . EYE SURGERY     bilateral cataract extraction with IOL  . TOTAL KNEE ARTHROPLASTY  12/13/2011   Procedure: TOTAL KNEE ARTHROPLASTY;  Surgeon: Johnn Hai, MD;  Location: [facility];  Service: Orthopedics;  Laterality: Left;   History reviewed. No pertinent family history. Social History   Occupational History  . retired [employer] farm manager    Social History Main Topics  . Smoking status: Never Smoker  . Smokeless tobacco: Never Used  . Alcohol use No     Comment: socially   wine  . Drug use: No  . Sexual activity: No    Tobacco Counseling Counseling given: Not Answered   Activities of Daily Living In your present state of health, do you have any difficulty performing the following activities: 05/17/2017  Hearing? N  Vision? N  Difficulty concentrating or making  decisions? Y  Walking or climbing stairs? N  Dressing or bathing? N  Doing errands, shopping? Y  Preparing Food and eating ? Y  Using the Toilet? N  In the past six months, have you accidently leaked urine? Y  Do you have problems with loss of bowel control? N  Managing your Medications? Y  Managing your Finances? Y  Housekeeping or managing your Housekeeping? Y  Some recent data might be hidden    Immunizations and Health Maintenance Immunization History  Administered Date(s) Administered  . Influenza Whole 05/27/2012, 06/10/2013  . Influenza-Unspecified 06/14/2014, 05/26/2015, 06/13/2016  . Pneumococcal Polysaccharide-23 10/03/2011  . Td 10/03/2011  . Zoster 08/28/2007   Health Maintenance Due  Topic Date Due  . PNA vac Low Risk Adult (2 of 2 - PCV13) 10/02/2012  . INFLUENZA VACCINE  03/27/2017    Patient Care Team: Blanchie Serve, MD as PCP - General (Internal Medicine) [name] Mast, Man X, NP as Nurse Practitioner (Nurse Practitioner) [name], MD as Attending Physician (Urology) [name], MD as Consulting Physician (Gastroenterology)  Indicate any recent Medical Services you may have received from other than [facility] providers in the past year (date may be approximate).     Assessment:   This is a routine wellness examination for [name].   Hearing/Vision screen No exam data present  Dietary issues and exercise activities discussed: Current Exercise Habits: The patient does not participate in regular exercise at present, Exercise limited by: None identified  Goals    None     Depression Screen PHQ 2/9 Scores 05/17/2017 09/15/2015 01/21/2014  PHQ - 2 Score 0 0 0    Fall Risk Fall Risk  05/17/2017 09/15/2015 01/21/2014  Falls in the past year? No No No    Cognitive Function: MMSE - Mini Mental State Exam 05/17/2017 09/15/2015 07/08/2014  Orientation to time 3 4 5   Orientation to Place 5 5 5   Registration 3 3 3   Attention/  Calculation 5 5 5   Recall 0 0 1  Language- name 2 objects 2 2 2   Language- repeat 1 1 1   Language- follow 3 step command 3 2 3   Language- read & follow direction 1 1 1   Write a sentence 1 1 1   Copy design 1 1 1   Total score 25 25 28         Screening Tests Health Maintenance  Topic Date Due  . PNA vac Low Risk Adult (2 of 2 - PCV13) 10/02/2012  . INFLUENZA VACCINE  03/27/2017  . TETANUS/TDAP  10/02/2021  . DEXA SCAN  Completed      Plan:    I have personally reviewed and addressed the Medicare Annual Wellness questionnaire and have noted the following in the patient's chart:  A. Medical and social history B. Use of alcohol, tobacco or illicit drugs  C. Current medications and supplements D. Functional ability and status E.  Nutritional status F.  Physical activity G. Advance directives H. List of other physicians I.  Hospitalizations, surgeries, and ER visits in previous 12 months J.  [place] to include hearing, vision, cognitive, depression L. Referrals and appointments - none  In addition, I have reviewed and discussed with patient certain preventive protocols, quality metrics, and best practice recommendations. A written personalized care plan for preventive services as well as general preventive health recommendations were provided to patient.  See attached scanned questionnaire for additional information.   Signed,   Rich Reining, RN Nurse Health Advisor   Quick Notes   Health Maintenance: Flu and PNA 13 vaccines due     Abnormal Screen: MMSE 25/30. Passed clock drawing     Patient Concerns: None     Nurse Concerns: None

## 2017-05-17 NOTE — Patient Instructions (Signed)
Ms. Janet Hernandez , Thank you for taking time to come for your Medicare Wellness Visit. I appreciate your ongoing commitment to your health goals. Please review the following plan we discussed and let me know if I can assist you in the future.   Screening recommendations/referrals: Colonoscopy excluded, pt over age 81 Mammogram excluded, pt over age 16 Bone Density up to date Recommended yearly ophthalmology/optometry visit for glaucoma screening and checkup Recommended yearly dental visit for hygiene and checkup  Vaccinations: Influenza vaccine due Pneumococcal vaccine 13 due Tdap vaccine up to date. Due 10/02/21 Shingles vaccine no in records    Advanced directives: In Chart  Conditions/risks identified: None  Next appointment: Dr. Bubba Camp makes rounds   Preventive Care 56 Years and Older, Female Preventive care refers to lifestyle choices and visits with your health care provider that can promote health and wellness. What does preventive care include?  A yearly physical exam. This is also called an annual well check.  Dental exams once or twice a year.  Routine eye exams. Ask your health care provider how often you should have your eyes checked.  Personal lifestyle choices, including:  Daily care of your teeth and gums.  Regular physical activity.  Eating a healthy diet.  Avoiding tobacco and drug use.  Limiting alcohol use.  Practicing safe sex.  Taking low-dose aspirin every day.  Taking vitamin and mineral supplements as recommended by your health care provider. What happens during an annual well check? The services and screenings done by your health care provider during your annual well check will depend on your age, overall health, lifestyle risk factors, and family history of disease. Counseling  Your health care provider may ask you questions about your:  Alcohol use.  Tobacco use.  Drug use.  Emotional well-being.  Home and relationship  well-being.  Sexual activity.  Eating habits.  History of falls.  Memory and ability to understand (cognition).  Work and work Statistician.  Reproductive health. Screening  You may have the following tests or measurements:  Height, weight, and BMI.  Blood pressure.  Lipid and cholesterol levels. These may be checked every 5 years, or more frequently if you are over 75 years old.  Skin check.  Lung cancer screening. You may have this screening every year starting at age 37 if you have a 30-pack-year history of smoking and currently smoke or have quit within the past 15 years.  Fecal occult blood test (FOBT) of the stool. You may have this test every year starting at age 19.  Flexible sigmoidoscopy or colonoscopy. You may have a sigmoidoscopy every 5 years or a colonoscopy every 10 years starting at age 51.  Hepatitis C blood test.  Hepatitis B blood test.  Sexually transmitted disease (STD) testing.  Diabetes screening. This is done by checking your blood sugar (glucose) after you have not eaten for a while (fasting). You may have this done every 1-3 years.  Bone density scan. This is done to screen for osteoporosis. You may have this done starting at age 23.  Mammogram. This may be done every 1-2 years. Talk to your health care provider about how often you should have regular mammograms. Talk with your health care provider about your test results, treatment options, and if necessary, the need for more tests. Vaccines  Your health care provider may recommend certain vaccines, such as:  Influenza vaccine. This is recommended every year.  Tetanus, diphtheria, and acellular pertussis (Tdap, Td) vaccine. You may need a Td  booster every 10 years.  Zoster vaccine. You may need this after age 55.  Pneumococcal 13-valent conjugate (PCV13) vaccine. One dose is recommended after age 47.  Pneumococcal polysaccharide (PPSV23) vaccine. One dose is recommended after age  39. Talk to your health care provider about which screenings and vaccines you need and how often you need them. This information is not intended to replace advice given to you by your health care provider. Make sure you discuss any questions you have with your health care provider. Document Released: 09/09/2015 Document Revised: 05/02/2016 Document Reviewed: 06/14/2015 Elsevier Interactive Patient Education  2017 [place] Prevention in the Home Falls can cause injuries. They can happen to people of all ages. There are many things you can do to make your home safe and to help prevent falls. What can I do on the outside of my home?  Regularly fix the edges of walkways and driveways and fix any cracks.  Remove anything that might make you trip as you walk through a door, such as a raised step or threshold.  Trim any bushes or trees on the path to your home.  Use bright outdoor lighting.  Clear any walking paths of anything that might make someone trip, such as rocks or tools.  Regularly check to see if handrails are loose or broken. Make sure that both sides of any steps have handrails.  Any raised decks and porches should have guardrails on the edges.  Have any leaves, snow, or ice cleared regularly.  Use sand or salt on walking paths during winter.  Clean up any spills in your garage right away. This includes oil or grease spills. What can I do in the bathroom?  Use night lights.  Install grab bars by the toilet and in the tub and shower. Do not use towel bars as grab bars.  Use non-skid mats or decals in the tub or shower.  If you need to sit down in the shower, use a plastic, non-slip stool.  Keep the floor dry. Clean up any water that spills on the floor as soon as it happens.  Remove soap buildup in the tub or shower regularly.  Attach bath mats securely with double-sided non-slip rug tape.  Do not have throw rugs and other things on the floor that can make  you trip. What can I do in the bedroom?  Use night lights.  Make sure that you have a light by your bed that is easy to reach.  Do not use any sheets or blankets that are too big for your bed. They should not hang down onto the floor.  Have a firm chair that has side arms. You can use this for support while you get dressed.  Do not have throw rugs and other things on the floor that can make you trip. What can I do in the kitchen?  Clean up any spills right away.  Avoid walking on wet floors.  Keep items that you use a lot in easy-to-reach places.  If you need to reach something above you, use a strong step stool that has a grab bar.  Keep electrical cords out of the way.  Do not use floor polish or wax that makes floors slippery. If you must use wax, use non-skid floor wax.  Do not have throw rugs and other things on the floor that can make you trip. What can I do with my stairs?  Do not leave any items on the stairs.  Make sure that there are handrails on both sides of the stairs and use them. Fix handrails that are broken or loose. Make sure that handrails are as long as the stairways.  Check any carpeting to make sure that it is firmly attached to the stairs. Fix any carpet that is loose or worn.  Avoid having throw rugs at the top or bottom of the stairs. If you do have throw rugs, attach them to the floor with carpet tape.  Make sure that you have a light switch at the top of the stairs and the bottom of the stairs. If you do not have them, ask someone to add them for you. What else can I do to help prevent falls?  Wear shoes that:  Do not have high heels.  Have rubber bottoms.  Are comfortable and fit you well.  Are closed at the toe. Do not wear sandals.  If you use a stepladder:  Make sure that it is fully opened. Do not climb a closed stepladder.  Make sure that both sides of the stepladder are locked into place.  Ask someone to hold it for you, if  possible.  Clearly mark and make sure that you can see:  Any grab bars or handrails.  First and last steps.  Where the edge of each step is.  Use tools that help you move around (mobility aids) if they are needed. These include:  Canes.  Walkers.  Scooters.  Crutches.  Turn on the lights when you go into a dark area. Replace any light bulbs as soon as they burn out.  Set up your furniture so you have a clear path. Avoid moving your furniture around.  If any of your floors are uneven, fix them.  If there are any pets around you, be aware of where they are.  Review your medicines with your doctor. Some medicines can make you feel dizzy. This can increase your chance of falling. Ask your doctor what other things that you can do to help prevent falls. This information is not intended to replace advice given to you by your health care provider. Make sure you discuss any questions you have with your health care provider. Document Released: 06/09/2009 Document Revised: 01/19/2016 Document Reviewed: 09/17/2014 Elsevier Interactive Patient Education  2017 [employer].

## 2017-06-13 ENCOUNTER — Encounter: Payer: Self-pay | Admitting: Internal Medicine

## 2017-06-13 ENCOUNTER — Non-Acute Institutional Stay: Payer: Medicare Other | Admitting: Internal Medicine

## 2017-06-13 DIAGNOSIS — K269 Duodenal ulcer, unspecified as acute or chronic, without hemorrhage or perforation: Secondary | ICD-10-CM | POA: Diagnosis not present

## 2017-06-13 DIAGNOSIS — M5440 Lumbago with sciatica, unspecified side: Secondary | ICD-10-CM | POA: Diagnosis not present

## 2017-06-13 DIAGNOSIS — G8929 Other chronic pain: Secondary | ICD-10-CM | POA: Diagnosis not present

## 2017-06-13 DIAGNOSIS — R413 Other amnesia: Secondary | ICD-10-CM | POA: Diagnosis not present

## 2017-06-13 DIAGNOSIS — N393 Stress incontinence (female) (male): Secondary | ICD-10-CM | POA: Diagnosis not present

## 2017-06-13 DIAGNOSIS — H8110 Benign paroxysmal vertigo, unspecified ear: Secondary | ICD-10-CM | POA: Diagnosis not present

## 2017-06-13 NOTE — Progress Notes (Signed)
Location:  [place] Room Number: 130 Place of Service:  ALF [phone]) Provider:  Blanchie Serve MD  Blanchie Serve, MD  Patient Care Team: Blanchie Serve, MD as PCP - General (Internal Medicine) [name] Mast, Man X, NP as Nurse Practitioner (Nurse Practitioner) [name], MD as Attending Physician (Urology) [name], MD as Consulting Physician (Gastroenterology)  Extended Emergency Contact Information Primary Emergency Contact: Crutcher,[name] Address: [address] [address] [name] [place] Phone: [phone] Relation: Son  Code Status:  DNR Goals of care: Advanced Directive information Advanced Directives 06/13/2017  Does Patient Have a Medical Advance Directive? Yes  Type of Advance Directive Living will;Out of facility DNR (pink MOST or yellow form)  Does patient want to make changes to medical advance directive? -  Copy of [place] in Chart? -  Pre-existing out of facility DNR order (yellow form or pink MOST form) Yellow form placed in chart (order not valid for inpatient use)     Chief Complaint  Patient presents with  . Medical Management of Chronic Issues    Routine Visit     HPI:  Pt is a 83 y.o. female seen today for medical management of chronic diseases.  She denies any concern this visit. She gets around with her walker. She needs assistance with shower. She has some memory loss. BP stable on review.   Lumbago- denies any pain this visit. Gets around with her walker. Currently on norco 7.5-325 mg bid scheduled and bid prn pain.  Stress incontinence- mostly continent with her urine. Currently on myrbetriq 50 mg daily.   Vertigo- denies dizziness. No fall reported. Currently on meclizine 25 mg tid prn  History of duodenal ulcer- no hematemesis or rectal bleed reported. Denies reflux symptom. Currently on pantoprazole 40 mg bid with sucralfate 1 g bid.    Dementia- MMSE 25/30 last month. Pleasantly confused. Able to express her needs.    Past Medical History:  Diagnosis Date  . Allergic rhinitis due to pollen 07/01/2009  . Anemia, unspecified 09/07/2010  . Arthritis   . Benign paroxysmal positional vertigo   . Candidiasis 01/01/2012  . Cramp of limb 01/11/2011  . Disorder of bone and cartilage, unspecified 11/10/2010  . Dizziness and giddiness 11/10/2010  . Insomnia, unspecified 09/07/2010  . Knee pain, left anterior   . Lumbago 07/01/2009  . Lump or mass in breast 08/01/2012  . Memory loss 01/22/2012  . Myalgia and myositis, unspecified 05/17/2011  . Myopathy, unspecified   . Osteoarthrosis, unspecified whether generalized or localized, lower leg   . Other abnormal glucose 03/23/2010  . Other chest pain 08/28/2012  . Other disorders of bone and cartilage(733.99) 09/19/2012  . Overactive bladder   . Rash    inner legs- ? med allergy- to see PCP if doesnt improve by end of week  . Rash and nonspecific skin eruption 10/28/2014  . Right bundle branch block   . Seborrheic keratoses 05/17/2011  . Spasm of muscle 01/11/2011  . Spinal stenosis, lumbar region, without neurogenic claudication   . Stress incontinence 07/08/2014  . Unspecified constipation 12/18/2011  . Unspecified tinnitus   . Unspecified urinary incontinence   . Urgency of urination   . Urinary frequency 09/18/2012   Past Surgical History:  Procedure Laterality Date  . ABDOMINAL HYSTERECTOMY    . APPENDECTOMY  1956  . BACK SURGERY     lumbar-- ? type  Dr Tonita Cong  . BREAST LUMPECTOMY  1993   breast cyst  . BREAST SURGERY     right lumpectomy  . CLEFT LIP REPAIR     with repair palate  . ESOPHAGOGASTRODUODENOSCOPY Left 03/10/2013   Procedure: ESOPHAGOGASTRODUODENOSCOPY (EGD);  Surgeon: Beryle Beams, MD;  Location: [facility];  Service: Endoscopy;  Laterality: Left;  . EYE SURGERY     bilateral cataract extraction with IOL  . TOTAL KNEE ARTHROPLASTY  12/13/2011   Procedure:  TOTAL KNEE ARTHROPLASTY;  Surgeon: Johnn Hai, MD;  Location: [facility];  Service: Orthopedics;  Laterality: Left;    No Known Allergies  Outpatient Encounter Prescriptions as of 06/13/2017  Medication Sig  . bisacodyl (DULCOLAX) 5 MG EC tablet Take 5 mg by mouth daily as needed.   Marland Kitchen HYDROcodone-acetaminophen (NORCO) 7.5-325 MG tablet Take one tablet by mouth two times a day as needed for pain  . HYDROcodone-acetaminophen (NORCO) 7.5-325 MG tablet Take 1 tablet by mouth 2 (two) times daily.  Marland Kitchen lidocaine (LMX) 4 % cream Apply 1 application topically 2 (two) times daily.  . meclizine (ANTIVERT) 25 MG tablet Take 25 mg by mouth 3 (three) times daily as needed. Vertigo   . MYRBETRIQ 50 MG TB24 tablet Take 50 mg by mouth daily.   . pantoprazole (PROTONIX) 40 MG tablet Take 1 tablet (40 mg total) by mouth 2 (two) times daily.  . sucralfate (CARAFATE) 1 G tablet Take 1 g by mouth 2 (two) times daily.    No facility-administered encounter medications on file as of 06/13/2017.     Review of Systems  Constitutional: Negative for appetite change, chills, diaphoresis and fever.  HENT: Positive for hearing loss. Negative for congestion, ear pain, mouth sores, sore throat and trouble swallowing.   Respiratory: Negative for cough and shortness of breath.   Cardiovascular: Negative for chest pain, palpitations and leg swelling.  Gastrointestinal: Negative for abdominal pain, blood in stool, constipation, diarrhea, nausea and vomiting.  Genitourinary: Positive for frequency. Negative for dysuria, hematuria and pelvic pain.  Musculoskeletal: Positive for back pain.       Uses a walker to ambulate. Has history of lumabgo and is on chronic pain medication. Also has chronic knee and hip pain per records. No fall reported.  Skin: Negative for rash and wound.  Neurological: Negative for dizziness, light-headedness, numbness and headaches.  Psychiatric/Behavioral: Positive for confusion and decreased  concentration. Negative for behavioral problems. The patient is not nervous/anxious.     Immunization History  Administered Date(s) Administered  . Influenza Whole 05/27/2012, 06/10/2013  . Influenza-Unspecified 06/14/2014, 05/26/2015, 06/13/2016  . Pneumococcal Polysaccharide-23 10/03/2011  . Td 10/03/2011  . Zoster 08/28/2007   Pertinent  Health Maintenance Due  Topic Date Due  . PNA vac Low Risk Adult (2 of 2 - PCV13) 10/02/2012  . INFLUENZA VACCINE  03/27/2017  . DEXA SCAN  Completed   Fall Risk  05/17/2017 09/15/2015 01/21/2014  Falls in the past year? No No No   Functional Status Survey:    Vitals:   06/13/17 1148  BP: (!) 144/78  Pulse: 78  Resp: 18  Temp: 97.6 F (36.4 C)  TempSrc: Oral  Weight: 177 lb 6.4 oz (80.5 kg)  Height: 5\' 4"  (1.626 m)   Body mass index is 30.45 kg/m.   Wt Readings from Last 3 Encounters:  06/13/17 177 lb 6.4 oz (80.5 kg)  05/17/17 170 lb (77.1 kg)  01/10/17 169 lb 9.6 oz (76.9 kg)   Physical Exam  Constitutional: She appears well-developed. No distress.  obese  HENT:  Head: Normocephalic and atraumatic.  Nose: Nose normal.  Mouth/Throat: Oropharynx is clear and moist. No oropharyngeal exudate.  Eyes: Pupils are equal, round, and reactive to light. Conjunctivae and EOM are normal. Right eye exhibits no discharge. Left eye exhibits no discharge.  Has corrective glasses  Neck: Normal range of motion. Neck supple. No thyromegaly present.  Cardiovascular: Normal rate and regular rhythm.   Pulmonary/Chest: Effort normal and breath sounds normal. No respiratory distress. She has no wheezes. She has no rales. She exhibits no tenderness.  Abdominal: Soft. Bowel sounds are normal. She exhibits no distension. There is no tenderness. There is no rebound and no guarding.  Musculoskeletal: Normal range of motion. She exhibits deformity. She exhibits no edema.  Lymphadenopathy:    She has no cervical adenopathy.  Neurological: She is alert.    Oriented to self, place and month 05/17/17 MMSE 25/30  Skin: Skin is warm and dry. No rash noted. She is not diaphoretic.  Psychiatric: She has a normal mood and affect.    Labs reviewed:  Recent Labs  08/23/16 01/15/17  NA 139 139  K 4.4 4.0  BUN 14 14  CREATININE 0.9 0.9    Recent Labs  08/23/16 01/15/17  AST 15 14  ALT 12 12  ALKPHOS 92 112    Recent Labs  01/15/17  WBC 6.8  HGB 12.6  HCT 37  PLT 255   Lab Results  Component Value Date   TSH 0.95 01/15/2017   Lab Results  Component Value Date   HGBA1C 5.6 04/24/2016   Lab Results  Component Value Date   CHOL 189 09/06/2015   HDL 65 09/06/2015   LDLCALC 99 09/06/2015   TRIG 126 09/06/2015    Significant Diagnostic Results in last 30 days:  No results found.  Assessment/Plan  1. Chronic right-sided low back pain with sciatica, sciatica laterality unspecified No pain this visit. Reviewed her scheduled and prn med. Has not required prn norco. Discontinue prn norco for now and monitor.   2. Benign paroxysmal positional vertigo, unspecified laterality Has not required prn meclizine. Change this to bid prn and monitor.  3. Duodenal ulcer without hemorrhage or perforation Controlled. Symptom. Continue pantoprazole 40 mg bid for now and sucralfate bid. Stable h&h.   4. Stress incontinence Continue myrbetriq for now. Incontinence care.   5. Memory loss MMSE 25/30 last month. Pleasantly confused. Able to express her needs. Stable TSH. Check b12. Consider donepezil 5 mg daily for now with her decline in memory over past few years on review of MMSE. Lab Results  Component Value Date   TSH 0.95 01/15/2017   No results found for: [id number]  Family/ staff Communication: reviewed care plan with patient and charge nurse.    Labs/tests ordered:  b12   Blanchie Serve, MD Internal Medicine [facility] [address] [place], [place] [address] Cell Phone (Monday-Friday 8  am - 5 pm): [phone] On Call: [phone] and follow prompts after 5 pm and on weekends Office Phone: [phone] Office Fax: [phone]

## 2017-06-18 DIAGNOSIS — R413 Other amnesia: Secondary | ICD-10-CM | POA: Diagnosis not present

## 2017-06-18 LAB — VITAMIN B12: VITAMIN B 12: 307

## 2017-06-19 ENCOUNTER — Other Ambulatory Visit: Payer: Self-pay | Admitting: *Deleted

## 2017-07-02 DIAGNOSIS — L84 Corns and callosities: Secondary | ICD-10-CM | POA: Diagnosis not present

## 2017-07-02 DIAGNOSIS — M2042 Other hammer toe(s) (acquired), left foot: Secondary | ICD-10-CM | POA: Diagnosis not present

## 2017-07-02 DIAGNOSIS — L602 Onychogryphosis: Secondary | ICD-10-CM | POA: Diagnosis not present

## 2017-09-20 ENCOUNTER — Encounter: Payer: Self-pay | Admitting: Nurse Practitioner

## 2017-09-20 ENCOUNTER — Non-Acute Institutional Stay: Payer: Medicare Other | Admitting: Nurse Practitioner

## 2017-09-20 DIAGNOSIS — K269 Duodenal ulcer, unspecified as acute or chronic, without hemorrhage or perforation: Secondary | ICD-10-CM | POA: Diagnosis not present

## 2017-09-20 DIAGNOSIS — H8110 Benign paroxysmal vertigo, unspecified ear: Secondary | ICD-10-CM

## 2017-09-20 DIAGNOSIS — M1711 Unilateral primary osteoarthritis, right knee: Secondary | ICD-10-CM | POA: Diagnosis not present

## 2017-09-20 DIAGNOSIS — N3281 Overactive bladder: Secondary | ICD-10-CM

## 2017-09-20 DIAGNOSIS — R413 Other amnesia: Secondary | ICD-10-CM | POA: Diagnosis not present

## 2017-09-20 NOTE — Assessment & Plan Note (Addendum)
Functioning adequately in [facility] setting, last MMSE 22/30 04/24/16, 25/30 04/28/17.

## 2017-09-20 NOTE — Assessment & Plan Note (Signed)
Hx of Duodenal ulcer is well managed, continue Pantoprazole 40mg  bid and Sucralfate 1gm bid.

## 2017-09-20 NOTE — Assessment & Plan Note (Signed)
Stable, no recent occurrence, prn Meclizine available to her.

## 2017-09-20 NOTE — Progress Notes (Signed)
Location:   [facility] Room Number: 628 Place of Service: [facility] Provider:  [facility] Burl Tauzin NP  Blanchie Serve, MD  Patient Care Team: Blanchie Serve, MD as PCP - General (Internal Medicine) [name] Marybeth Dandy X, NP as Nurse Practitioner (Nurse Practitioner) [name], MD as Attending Physician (Urology) [name], MD as Consulting Physician (Gastroenterology)  Extended Emergency Contact Information Primary Emergency Contact: Sipe,[name] Address: [place] [address] [name] of [place] Phone: [phone] Relation: Son  Code Status:  DNR Goals of care: Advanced Directive information Advanced Directives 09/20/2017  Does Patient Have a Medical Advance Directive? Yes  Type of Paramedic of [place];Living will  Does patient want to make changes to medical advance directive? No - Patient declined  Copy of [place] in Chart? Yes  Pre-existing out of facility DNR order (yellow form or pink MOST form) -     Chief Complaint  Patient presents with  . Medical Management of Chronic Issues    Routine Visit     HPI:  Pt is a 84 y.o. female seen today for medical management of chronic diseases.     The patient resides in [facility], ambulates with rollator walker, transfers self, continent of urine, she stated she sleeps and eats well, denied pain or discomfort while on Lidocaine cream bid and Norco 7.5/325mg  bid. She has history of urinary frequency, controlled on Myrbetriq, she stated her bathroom trips at night is about 0-1x/night. Duodenal ulcer is well managed on Pantoprazole 40mg  bid and Sucralfate 1gm bid.    Past Medical History:  Diagnosis Date  . Allergic rhinitis due to pollen 07/01/2009  . Anemia, unspecified 09/07/2010  . Arthritis   . Benign paroxysmal positional vertigo   . Candidiasis 01/01/2012  . Cramp of limb 01/11/2011  . Disorder of bone and cartilage,  unspecified 11/10/2010  . Dizziness and giddiness 11/10/2010  . Insomnia, unspecified 09/07/2010  . Knee pain, left anterior   . Lumbago 07/01/2009  . Lump or mass in breast 08/01/2012  . Memory loss 01/22/2012  . Myalgia and myositis, unspecified 05/17/2011  . Myopathy, unspecified   . Osteoarthrosis, unspecified whether generalized or localized, lower leg   . Other abnormal glucose 03/23/2010  . Other chest pain 08/28/2012  . Other disorders of bone and cartilage(733.99) 09/19/2012  . Overactive bladder   . Rash    inner legs- ? med allergy- to see PCP if doesnt improve by end of week  . Rash and nonspecific skin eruption 10/28/2014  . Right bundle branch block   . Seborrheic keratoses 05/17/2011  . Spasm of muscle 01/11/2011  . Spinal stenosis, lumbar region, without neurogenic claudication   . Stress incontinence 07/08/2014  . Unspecified constipation 12/18/2011  . Unspecified tinnitus   . Unspecified urinary incontinence   . Urgency of urination   . Urinary frequency 09/18/2012   Past Surgical History:  Procedure Laterality Date  . ABDOMINAL HYSTERECTOMY    . APPENDECTOMY  1956  . BACK SURGERY     lumbar-- ? type  Dr Tonita Cong  . BREAST LUMPECTOMY  1993   breast cyst  . BREAST SURGERY     right lumpectomy  . CLEFT LIP REPAIR     with repair palate  . ESOPHAGOGASTRODUODENOSCOPY Left 03/10/2013   Procedure: ESOPHAGOGASTRODUODENOSCOPY (EGD);  Surgeon: Beryle Beams, MD;  Location: [facility];  Service: Endoscopy;  Laterality:  Left;  . EYE SURGERY     bilateral cataract extraction with IOL  . TOTAL KNEE ARTHROPLASTY  12/13/2011   Procedure: TOTAL KNEE ARTHROPLASTY;  Surgeon: Johnn Hai, MD;  Location: [facility];  Service: Orthopedics;  Laterality: Left;    No Known Allergies  Allergies as of 09/20/2017   No Known Allergies     Medication List        Accurate as of 09/20/17 12:19 PM. Always use your most recent med list.          bisacodyl 5 MG EC tablet Commonly known as:   DULCOLAX Take 5 mg by mouth daily as needed.   HYDROcodone-acetaminophen 7.5-325 MG tablet Commonly known as:  NORCO Take 1 tablet by mouth 2 (two) times daily.   lidocaine 4 % cream Commonly known as:  LMX Apply 1 application topically 2 (two) times daily.   meclizine 25 MG tablet Commonly known as:  ANTIVERT Take 25 mg by mouth 2 (two) times daily as needed. Vertigo   MYRBETRIQ 50 MG Tb24 tablet Generic drug:  mirabegron ER Take 50 mg by mouth daily.   pantoprazole 40 MG tablet Commonly known as:  PROTONIX Take 1 tablet (40 mg total) by mouth 2 (two) times daily.   sucralfate 1 g tablet Commonly known as:  CARAFATE Take 1 g by mouth 2 (two) times daily.      ROS was provided with assistance of staff Review of Systems  Constitutional: Negative for activity change, appetite change, chills, diaphoresis, fatigue and fever.  HENT: Positive for hearing loss. Negative for congestion, trouble swallowing and voice change.   Eyes: Negative for visual disturbance.  Respiratory: Negative for cough, chest tightness, shortness of breath and wheezing.   Cardiovascular: Negative for chest pain and palpitations.  Gastrointestinal: Negative for abdominal distention, abdominal pain, blood in stool, constipation, diarrhea, nausea and vomiting.  Endocrine: Negative for cold intolerance.  Genitourinary: Negative for difficulty urinating, dysuria, frequency and urgency.  Musculoskeletal: Positive for arthralgias and gait problem. Negative for joint swelling.       Hx of complaining of right knee pain, lower back pain  Skin: Negative for color change, pallor, rash and wound.  Neurological: Negative for dizziness, tremors, speech difficulty, weakness, light-headedness and headaches.       Memory recall difficulties.   Psychiatric/Behavioral: Positive for confusion. Negative for agitation, behavioral problems, hallucinations and sleep disturbance. The patient is not nervous/anxious.      Immunization History  Administered Date(s) Administered  . Influenza Whole 05/27/2012, 06/10/2013  . Influenza-Unspecified 06/14/2014, 05/26/2015, 06/13/2016, 06/17/2017  . Pneumococcal Conjugate-13 06/24/2017  . Pneumococcal Polysaccharide-23 10/03/2011  . Td 10/03/2011  . Zoster 08/28/2007   Pertinent  Health Maintenance Due  Topic Date Due  . INFLUENZA VACCINE  Completed  . DEXA SCAN  Completed  . PNA vac Low Risk Adult  Completed   Fall Risk  05/17/2017 09/15/2015 01/21/2014  Falls in the past year? No No No   Functional Status Survey:    Vitals:   09/20/17 1116  BP: 136/70  Pulse: 72  Resp: 18  Temp: 98.2 F (36.8 C)  TempSrc: Oral  SpO2: 97%  Weight: 179 lb (81.2 kg)  Height: 5\' 2"  (1.575 m)   Body mass index is 32.74 kg/m. Physical Exam  Constitutional: She is oriented to person, place, and time. She appears well-developed and well-nourished. No distress.  HENT:  Head: Normocephalic and atraumatic.  Eyes: Conjunctivae and EOM are normal. Pupils are equal, round, and  reactive to light. Right eye exhibits no discharge. Left eye exhibits no discharge.  Neck: Normal range of motion. Neck supple. No JVD present. No thyromegaly present.  Cardiovascular: Normal rate, regular rhythm and normal heart sounds.  No murmur heard. Pulmonary/Chest: Effort normal and breath sounds normal. She has no wheezes. She has no rales.  Abdominal: Soft. Bowel sounds are normal. She exhibits no distension. There is no tenderness. There is no rebound and no guarding.  Musculoskeletal: Normal range of motion. She exhibits no edema or tenderness.  Neurological: She is alert and oriented to person, place, and time. She exhibits normal muscle tone. Coordination normal.  Skin: Skin is warm and dry. No rash noted. She is not diaphoretic. No erythema.  Psychiatric: She has a normal mood and affect. Her behavior is normal. Judgment and thought content normal.    Labs reviewed: Recent Labs     01/15/17  NA 139  K 4.0  BUN 14  CREATININE 0.9   Recent Labs    01/15/17  AST 14  ALT 12  ALKPHOS 112   Recent Labs    01/15/17  WBC 6.8  HGB 12.6  HCT 37  PLT 255   Lab Results  Component Value Date   TSH 0.95 01/15/2017   Lab Results  Component Value Date   HGBA1C 5.6 04/24/2016   Lab Results  Component Value Date   CHOL 189 09/06/2015   HDL 65 09/06/2015   LDLCALC 99 09/06/2015   TRIG 126 09/06/2015    Significant Diagnostic Results in last 30 days:  No results found.  Assessment/Plan  Duodenal ulcer without hemorrhage or perforation Hx of Duodenal ulcer is well managed, continue Pantoprazole 40mg  bid and Sucralfate 1gm bid.   Benign paroxysmal positional vertigo Stable, no recent occurrence, prn Meclizine available to her.   Osteoarthritis of right knee Still ambulating with rollator walker, no complain of pain today, continue Norco 7.5mg /325mg  bid, continue Lidocaine cream bid.   Memory loss Functioning adequately in [place] setting, last MMSE 22/30 04/24/16, 25/30 04/28/17.   Overactive bladder Stable, nocturnal urination 0-1x/night, continue Myrbetriq.    Family/ staff Communication: plan of care reviewed with the patient and charge nurse.   Labs/tests ordered:  none  Time spend 25 minutes.

## 2017-09-20 NOTE — Assessment & Plan Note (Signed)
Still ambulating with rollator walker, no complain of pain today, continue Norco 7.5mg /325mg  bid, continue Lidocaine cream bid.

## 2017-09-20 NOTE — Assessment & Plan Note (Signed)
Stable, nocturnal urination 0-1x/night, continue Myrbetriq.

## 2017-12-17 ENCOUNTER — Encounter: Payer: Self-pay | Admitting: Internal Medicine

## 2017-12-17 ENCOUNTER — Non-Acute Institutional Stay: Payer: Medicare Other | Admitting: Internal Medicine

## 2017-12-17 DIAGNOSIS — F0390 Unspecified dementia without behavioral disturbance: Secondary | ICD-10-CM | POA: Diagnosis not present

## 2017-12-17 DIAGNOSIS — N3281 Overactive bladder: Secondary | ICD-10-CM

## 2017-12-17 DIAGNOSIS — Z8719 Personal history of other diseases of the digestive system: Secondary | ICD-10-CM

## 2017-12-17 DIAGNOSIS — G8929 Other chronic pain: Secondary | ICD-10-CM | POA: Diagnosis not present

## 2017-12-17 DIAGNOSIS — H8110 Benign paroxysmal vertigo, unspecified ear: Secondary | ICD-10-CM

## 2017-12-17 DIAGNOSIS — M5440 Lumbago with sciatica, unspecified side: Secondary | ICD-10-CM | POA: Diagnosis not present

## 2017-12-17 NOTE — Progress Notes (Signed)
Location:  [facility] Room Number: 024 Place of Service:  [facility] [phone]) Provider:  Blanchie Serve MD  Blanchie Serve, MD  Patient Care Team: Blanchie Serve, MD as PCP - General (Internal Medicine) [name] Mast, Man X, NP as Nurse Practitioner (Nurse Practitioner) [name], MD as Attending Physician (Urology) [name], MD as Consulting Physician (Gastroenterology)  Extended Emergency Contact Information Primary Emergency Contact: Faraone,[name] Address: [place] [address] [name] of [place] Phone: [phone] Relation: Son  Code Status:  DNR  Goals of care: Advanced Directive information Advanced Directives 12/17/2017  Does Patient Have a Medical Advance Directive? Yes  Type of Paramedic of [place];Living will;Out of facility DNR (pink MOST or yellow form)  Does patient want to make changes to medical advance directive? No - Patient declined  Copy of [place] in Chart? Yes  Pre-existing out of facility DNR order (yellow form or pink MOST form) Yellow form placed in chart (order not valid for inpatient use)     Chief Complaint  Patient presents with  . Medical Management of Chronic Issues    Routine Visit     HPI:  Pt is a 84 y.o. female seen today for medical management of chronic diseases. She denies any concern this visit. She has short term memory loss  Low back pain- to right side, asymptomatic this visit. Reviewed meds. On norco 7.5-325 mg bid and lidocaine cream bid  OAB- with some urinary urgency, overall continent with her urination. Denies dysuria. Wears diaper.   Vertigo- denies symptom. Currently on meclizine bid prn. On chart review, has not required any  gerd- hx of duodenal ulcer, no reflux symptoms, takes PPI and sucralfate  Dementia- with short term memory loss. Currently on donepezil 5 mg qhs. MMSE 25/30   Past Medical  History:  Diagnosis Date  . Allergic rhinitis due to pollen 07/01/2009  . Anemia, unspecified 09/07/2010  . Arthritis   . Benign paroxysmal positional vertigo   . Candidiasis 01/01/2012  . Cramp of limb 01/11/2011  . Disorder of bone and cartilage, unspecified 11/10/2010  . Dizziness and giddiness 11/10/2010  . Insomnia, unspecified 09/07/2010  . Knee pain, left anterior   . Lumbago 07/01/2009  . Lump or mass in breast 08/01/2012  . Memory loss 01/22/2012  . Myalgia and myositis, unspecified 05/17/2011  . Myopathy, unspecified   . Osteoarthrosis, unspecified whether generalized or localized, lower leg   . Other abnormal glucose 03/23/2010  . Other chest pain 08/28/2012  . Other disorders of bone and cartilage(733.99) 09/19/2012  . Overactive bladder   . Rash    inner legs- ? med allergy- to see PCP if doesnt improve by end of week  . Rash and nonspecific skin eruption 10/28/2014  . Right bundle branch block   . Seborrheic keratoses 05/17/2011  . Spasm of muscle 01/11/2011  . Spinal stenosis, lumbar region, without neurogenic claudication   . Stress incontinence 07/08/2014  . Unspecified constipation 12/18/2011  . Unspecified tinnitus   . Unspecified urinary incontinence   . Urgency of urination   . Urinary frequency 09/18/2012   Past Surgical History:  Procedure Laterality Date  . ABDOMINAL HYSTERECTOMY    . APPENDECTOMY  1956  . BACK SURGERY     lumbar-- ? type  Dr Tonita Cong  . BREAST LUMPECTOMY  1993   breast cyst  . BREAST SURGERY  right lumpectomy  . CLEFT LIP REPAIR     with repair palate  . ESOPHAGOGASTRODUODENOSCOPY Left 03/10/2013   Procedure: ESOPHAGOGASTRODUODENOSCOPY (EGD);  Surgeon: Beryle Beams, MD;  Location: [facility];  Service: Endoscopy;  Laterality: Left;  . EYE SURGERY     bilateral cataract extraction with IOL  . TOTAL KNEE ARTHROPLASTY  12/13/2011   Procedure: TOTAL KNEE ARTHROPLASTY;  Surgeon: Johnn Hai, MD;  Location: [facility];  Service: Orthopedics;   Laterality: Left;    No Known Allergies  Outpatient Encounter Medications as of 12/17/2017  Medication Sig  . bisacodyl (DULCOLAX) 5 MG EC tablet Take 5 mg by mouth daily as needed.   . donepezil (ARICEPT) 5 MG tablet Take 5 mg by mouth at bedtime.  Marland Kitchen HYDROcodone-acetaminophen (NORCO) 7.5-325 MG tablet Take 1 tablet by mouth 2 (two) times daily.  Marland Kitchen lidocaine (LMX) 4 % cream Apply 1 application topically 2 (two) times daily.  . meclizine (ANTIVERT) 25 MG tablet Take 25 mg by mouth 2 (two) times daily as needed. Vertigo   . MYRBETRIQ 50 MG TB24 tablet Take 50 mg by mouth daily.   . pantoprazole (PROTONIX) 40 MG tablet Take 1 tablet (40 mg total) by mouth 2 (two) times daily.  . sucralfate (CARAFATE) 1 G tablet Take 1 g by mouth 2 (two) times daily.    No facility-administered encounter medications on file as of 12/17/2017.     Review of Systems  Constitutional: Negative for appetite change, chills, fatigue, fever and unexpected weight change.  HENT: Negative for congestion, ear discharge, ear pain, mouth sores, rhinorrhea and trouble swallowing.        Has partial dentures  Eyes: Positive for visual disturbance.       Has corrective glasses  Respiratory: Negative for cough and shortness of breath.   Cardiovascular: Negative for chest pain, palpitations and leg swelling.  Gastrointestinal: Negative for abdominal pain, blood in stool, constipation, diarrhea, nausea and vomiting.  Genitourinary: Positive for frequency and urgency. Negative for dysuria.  Musculoskeletal: Positive for gait problem. Negative for arthralgias and back pain.       Chronic back pain on chronic pain medication  Skin: Negative for rash.  Neurological: Negative for dizziness, weakness, numbness and headaches.  Psychiatric/Behavioral: Positive for confusion. Negative for behavioral problems and sleep disturbance.    Immunization History  Administered Date(s) Administered  . Influenza Whole 05/27/2012, 06/10/2013   . Influenza-Unspecified 06/14/2014, 05/26/2015, 06/13/2016, 06/17/2017  . Pneumococcal Conjugate-13 06/24/2017  . Pneumococcal Polysaccharide-23 10/03/2011  . Td 10/03/2011  . Zoster 08/28/2007   Pertinent  Health Maintenance Due  Topic Date Due  . INFLUENZA VACCINE  03/27/2018  . DEXA SCAN  Completed  . PNA vac Low Risk Adult  Completed   Fall Risk  05/17/2017 09/15/2015 01/21/2014  Falls in the past year? No No No   Functional Status Survey:    Vitals:   12/17/17 1407  BP: 130/82  Pulse: 80  Resp: 20  Temp: 98 F (36.7 C)  TempSrc: Oral  SpO2: 97%  Weight: 178 lb 9.6 oz (81 kg)  Height: 5\' 2"  (1.575 m)   Body mass index is 32.67 kg/m.   Wt Readings from Last 3 Encounters:  12/17/17 178 lb 9.6 oz (81 kg)  09/20/17 179 lb (81.2 kg)  06/13/17 177 lb 6.4 oz (80.5 kg)   Physical Exam  Constitutional: She is oriented to person, place, and time. She appears well-developed. No distress.  Obese elderly female  HENT:  Head:  Normocephalic and atraumatic.  Right Ear: External ear normal.  Left Ear: External ear normal.  Nose: Nose normal.  Mouth/Throat: Oropharynx is clear and moist. No oropharyngeal exudate.  Hearing aids in place, has upper partial dentures  Eyes: Pupils are equal, round, and reactive to light. Conjunctivae and EOM are normal. Right eye exhibits no discharge. Left eye exhibits no discharge.  Neck: Normal range of motion. Neck supple.  Cardiovascular: Normal rate, regular rhythm and intact distal pulses.  No murmur heard. Pulmonary/Chest: Effort normal and breath sounds normal. She has no wheezes. She has no rales.  Abdominal: Soft. Bowel sounds are normal. She exhibits no mass. There is no tenderness. There is no guarding.  Musculoskeletal: She exhibits no edema or tenderness.  Able to move all 4 extremities, adequate strength, unsteady gait, uses walker  Lymphadenopathy:    She has no cervical adenopathy.  Neurological: She is alert and oriented to  person, place, and time. She exhibits normal muscle tone.  Short term memory loss and forgetful  Skin: Skin is warm and dry. She is not diaphoretic. No erythema.  Psychiatric: She has a normal mood and affect. Her behavior is normal.    Labs reviewed: Recent Labs    01/15/17  NA 139  K 4.0  BUN 14  CREATININE 0.9   Recent Labs    01/15/17  AST 14  ALT 12  ALKPHOS 112   Recent Labs    01/15/17  WBC 6.8  HGB 12.6  HCT 37  PLT 255   Lab Results  Component Value Date   TSH 0.95 01/15/2017   Lab Results  Component Value Date   HGBA1C 5.6 04/24/2016   Lab Results  Component Value Date   CHOL 189 09/06/2015   HDL 65 09/06/2015   LDLCALC 99 09/06/2015   TRIG 126 09/06/2015   06/18/17 b12- 307  Significant Diagnostic Results in last 30 days:  No results found.  Assessment/Plan  1. Benign paroxysmal positional vertigo, unspecified laterality D/c meclizine for non use, currently symptom free.   2. Overactive bladder Continue perineal care and myrbetriq, monitor. Check cmp  3. Chronic right-sided low back pain with sciatica, sciatica laterality unspecified Stable, in no distress, decrease norco to 5-325 mg bid and continue lidocaine cream for now. Check LFT  4. History of duodenal ulcer Reviewed EGD note from 2014 where PPI once a day has been recommended indefinitely and carafate qid x 1 month. Currently on pantoprazole 40 mg bid and carafate bid. Symptom free. No melena or hematemesis reported. D/c carafate for now. Continue PPI. Check cbc  5. Dementia without behavioral disturbance, unspecified dementia type Continue donepezil daily, supportive care. Monitor for behavior changes. Check b12, bmp, cbc, TSH    Family/ staff Communication: reviewed care plan with patient and charge nurse.    Labs/tests ordered:  CBC, CMP, TSH, b12   Blanchie Serve, MD Internal Medicine [facility] [address] [place], [place]  [address] Cell Phone (Monday-Friday 8 am - 5 pm): [phone] On Call: [phone] and follow prompts after 5 pm and on weekends Office Phone: [phone] Office Fax: [phone]

## 2017-12-19 DIAGNOSIS — N3281 Overactive bladder: Secondary | ICD-10-CM | POA: Diagnosis not present

## 2017-12-19 DIAGNOSIS — F0390 Unspecified dementia without behavioral disturbance: Secondary | ICD-10-CM | POA: Diagnosis not present

## 2017-12-19 LAB — HEPATIC FUNCTION PANEL
ALT: 15 (ref 7–35)
AST: 15 (ref 13–35)
Alkaline Phosphatase: 108 (ref 25–125)
Bilirubin, Total: 0.5

## 2017-12-19 LAB — TSH: TSH: 1.56 (ref <=5.90)

## 2017-12-19 LAB — BASIC METABOLIC PANEL
BUN: 20 (ref 4–21)
Creatinine: 0.9 (ref <=1.1)
GLUCOSE: 114
Potassium: 4.2 (ref 3.4–5.3)
Sodium: 136 — AB (ref 137–147)

## 2017-12-19 LAB — CBC AND DIFFERENTIAL
HCT: 43 (ref 36–46)
Hemoglobin: 14.4 (ref 12.0–16.0)
PLATELETS: 305 (ref 150–399)
WBC: 7.6

## 2017-12-19 LAB — VITAMIN B12: VITAMIN B 12: 365

## 2017-12-20 ENCOUNTER — Other Ambulatory Visit: Payer: Self-pay | Admitting: *Deleted

## 2018-03-18 ENCOUNTER — Other Ambulatory Visit: Payer: Self-pay

## 2018-03-18 MED ORDER — HYDROCODONE-ACETAMINOPHEN 7.5-325 MG PO TABS: 1.0000 | ORAL_TABLET | Freq: Two times a day (BID) | ORAL | 0 refills | Status: DC

## 2018-03-27 ENCOUNTER — Encounter: Payer: Self-pay | Admitting: Nurse Practitioner

## 2018-03-27 ENCOUNTER — Non-Acute Institutional Stay: Payer: Medicare Other | Admitting: Nurse Practitioner

## 2018-03-27 DIAGNOSIS — N3281 Overactive bladder: Secondary | ICD-10-CM | POA: Diagnosis not present

## 2018-03-27 DIAGNOSIS — Z8719 Personal history of other diseases of the digestive system: Secondary | ICD-10-CM

## 2018-03-27 DIAGNOSIS — M25531 Pain in right wrist: Secondary | ICD-10-CM | POA: Diagnosis not present

## 2018-03-27 DIAGNOSIS — M25532 Pain in left wrist: Secondary | ICD-10-CM | POA: Insufficient documentation

## 2018-03-27 DIAGNOSIS — R413 Other amnesia: Secondary | ICD-10-CM | POA: Diagnosis not present

## 2018-03-27 NOTE — Assessment & Plan Note (Signed)
Stable, continue PPI

## 2018-03-27 NOTE — Assessment & Plan Note (Addendum)
Obtain X-ray R wrist ap, lateral, oblique views to r/o fxs. Apply Aspercreme qid to the right wrist x 1 week. May consider Uric acid, ESR, RAF, CBC/diff, CMP is no better. Continue Norco 5/325mg bid.  Observe.  

## 2018-03-27 NOTE — Progress Notes (Signed)
Location:  [facility] Room Number: 960 Place of Service:  SNF ([phone]) Provider:  Jeniel Slauson, [name]  NP  Blanchie Serve, MD  Patient Care Team: Blanchie Serve, MD as PCP - General (Internal Medicine) [name] Ezell Melikian X, NP as Nurse Practitioner (Nurse Practitioner) [name], MD as Attending Physician (Urology) [name], MD as Consulting Physician (Gastroenterology)  Extended Emergency Contact Information Primary Emergency Contact: Buhl,[name] Address: [place] [address] [name] [place] Phone: [phone] Relation: Son  Code Status:  DNR Goals of care: Advanced Directive information Advanced Directives 03/27/2018  Does Patient Have a Medical Advance Directive? Yes  Type of Advance Directive Living will;Out of facility DNR (pink MOST or yellow form)  Does patient want to make changes to medical advance directive? No - Patient declined  Copy of [place] in Chart? -  Pre-existing out of facility DNR order (yellow form or pink MOST form) -     Chief Complaint  Patient presents with  . Medical Management of Chronic Issues    F/u- overactive bladder, chronic back pain, memory loss    HPI:  Pt is a 84 y.o. female seen today for medical management of chronic diseases.     The patient hs history of dementia, HPI was provided with assistance of staff. Resides in [facility], on Donepezil '5mg'$  qd to preserve memory. Urinary frequency is managed with Myrbetriq '50mg'$  daily, has no change. GERD stable on Pantoprazole '40mg'$  bid. She complains of right wrist pain when she was examined today, on Norco 5/'325mg'$  bid for arthritic pain. She denied trauma or injury to her right wrist, onset is sudden, duration since breakfast. Pain worsened with ROM, mild warmth and swelling noted.  Past Medical History:  Diagnosis Date  . Allergic rhinitis due to pollen 07/01/2009  . Anemia, unspecified 09/07/2010    . Arthritis   . Benign paroxysmal positional vertigo   . Candidiasis 01/01/2012  . Cramp of limb 01/11/2011  . Disorder of bone and cartilage, unspecified 11/10/2010  . Dizziness and giddiness 11/10/2010  . Insomnia, unspecified 09/07/2010  . Knee pain, left anterior   . Lumbago 07/01/2009  . Lump or mass in breast 08/01/2012  . Memory loss 01/22/2012  . Myalgia and myositis, unspecified 05/17/2011  . Myopathy, unspecified   . Osteoarthrosis, unspecified whether generalized or localized, lower leg   . Other abnormal glucose 03/23/2010  . Other chest pain 08/28/2012  . Other disorders of bone and cartilage(733.99) 09/19/2012  . Overactive bladder   . Rash    inner legs- ? med allergy- to see PCP if doesnt improve by end of week  . Rash and nonspecific skin eruption 10/28/2014  . Right bundle branch block   . Seborrheic keratoses 05/17/2011  . Spasm of muscle 01/11/2011  . Spinal stenosis, lumbar region, without neurogenic claudication   . Stress incontinence 07/08/2014  . Unspecified constipation 12/18/2011  . Unspecified tinnitus   . Unspecified urinary incontinence   . Urgency of urination   . Urinary frequency 09/18/2012   Past Surgical History:  Procedure Laterality Date  . ABDOMINAL HYSTERECTOMY    . APPENDECTOMY  1956  . BACK SURGERY     lumbar-- ? type  Dr Tonita Cong  . BREAST LUMPECTOMY  1993   breast cyst  . BREAST SURGERY     right lumpectomy  . CLEFT LIP REPAIR     with repair  palate  . ESOPHAGOGASTRODUODENOSCOPY Left 03/10/2013   Procedure: ESOPHAGOGASTRODUODENOSCOPY (EGD);  Surgeon: Beryle Beams, MD;  Location: [facility];  Service: Endoscopy;  Laterality: Left;  . EYE SURGERY     bilateral cataract extraction with IOL  . TOTAL KNEE ARTHROPLASTY  12/13/2011   Procedure: TOTAL KNEE ARTHROPLASTY;  Surgeon: Johnn Hai, MD;  Location: [facility];  Service: Orthopedics;  Laterality: Left;    No Known Allergies  Outpatient Encounter Medications as of 03/27/2018  Medication Sig   . [EXPIRED] acetaminophen (TYLENOL) 325 MG tablet Take 650 mg by mouth every 4 (four) hours as needed.  . bisacodyl (DULCOLAX) 5 MG EC tablet Take 5 mg by mouth daily as needed.   . donepezil (ARICEPT) 5 MG tablet Take 5 mg by mouth at bedtime.  Marland Kitchen HYDROcodone-acetaminophen (NORCO/VICODIN) 5-325 MG tablet Take 1 tablet by mouth 2 (two) times daily.  Marland Kitchen lidocaine (LMX) 4 % cream Apply 1 application topically 2 (two) times daily.  Marland Kitchen MYRBETRIQ 50 MG TB24 tablet Take 50 mg by mouth daily.   . pantoprazole (PROTONIX) 40 MG tablet Take 1 tablet (40 mg total) by mouth 2 (two) times daily.  . [DISCONTINUED] HYDROcodone-acetaminophen (NORCO) 7.5-325 MG tablet Take 1 tablet by mouth 2 (two) times daily.  . [DISCONTINUED] meclizine (ANTIVERT) 25 MG tablet Take 25 mg by mouth 2 (two) times daily as needed. Vertigo   . [DISCONTINUED] sucralfate (CARAFATE) 1 G tablet Take 1 g by mouth 2 (two) times daily.    No facility-administered encounter medications on file as of 03/27/2018.    ROS was provided with assistance of staff Review of Systems  Constitutional: Negative for activity change, appetite change, chills, diaphoresis, fatigue and fever.  HENT: Positive for hearing loss. Negative for congestion, trouble swallowing and voice change.   Eyes: Negative for visual disturbance.  Respiratory: Negative for cough, shortness of breath and wheezing.   Cardiovascular: Negative for chest pain, palpitations and leg swelling.  Gastrointestinal: Negative for abdominal distention, abdominal pain, constipation, diarrhea, nausea and vomiting.  Genitourinary: Positive for frequency. Negative for difficulty urinating, dysuria and urgency.  Musculoskeletal: Positive for arthralgias, back pain, gait problem and joint swelling.       Right wrist warmth, tenderness, swelling.   Skin: Positive for color change. Negative for pallor, rash and wound.       Right wrist warmth, redness, tenderness  Neurological: Negative for  dizziness and facial asymmetry.       Dementia  Psychiatric/Behavioral: Positive for confusion. Negative for agitation, behavioral problems and sleep disturbance. The patient is not nervous/anxious.     Immunization History  Administered Date(s) Administered  . Influenza Whole 05/27/2012, 06/10/2013  . Influenza-Unspecified 06/14/2014, 05/26/2015, 06/13/2016, 06/17/2017  . Pneumococcal Conjugate-13 06/24/2017  . Pneumococcal Polysaccharide-23 10/03/2011  . Td 10/03/2011  . Zoster 08/28/2007   Pertinent  Health Maintenance Due  Topic Date Due  . INFLUENZA VACCINE  03/27/2018  . DEXA SCAN  Completed  . PNA vac Low Risk Adult  Completed   Fall Risk  05/17/2017 09/15/2015 01/21/2014  Falls in the past year? No No No   Functional Status Survey:    Vitals:   03/27/18 1426  BP: 130/70  Pulse: 70  Resp: 18  Temp: 98.3 F (36.8 C)  SpO2: 94%  Weight: 179 lb 12.8 oz (81.6 kg)  Height: '5\' 2"'$  (1.575 m)   Body mass index is 32.89 kg/m. Physical Exam  Constitutional: She appears well-developed and well-nourished.  HENT:  Head: Normocephalic and atraumatic.  Eyes: Pupils are equal, round, and reactive to light. EOM are normal.  Neck: Normal range of motion. Neck supple. No JVD present. No thyromegaly present.  Cardiovascular: Normal rate and regular rhythm.  No murmur heard. Pulmonary/Chest: Effort normal and breath sounds normal. She has no wheezes. She has no rales.  Abdominal: Soft. Bowel sounds are normal. She exhibits no distension. There is no tenderness.  Musculoskeletal: She exhibits edema and tenderness.  The right wrist mild swelling, warmth, tenderness. Ambulates with cane.   Neurological: She is alert. No cranial nerve deficit. She exhibits normal muscle tone. Coordination normal.  Oriented to person and place.   Psychiatric: She has a normal mood and affect. Her behavior is normal.    Labs reviewed: Recent Labs    12/19/17  NA 136*  K 4.2  BUN 20  CREATININE  0.9   Recent Labs    12/19/17  AST 15  ALT 15  ALKPHOS 108   Recent Labs    12/19/17  WBC 7.6  HGB 14.4  HCT 43  PLT 305   Lab Results  Component Value Date   TSH 1.56 12/19/2017   Lab Results  Component Value Date   HGBA1C 5.6 04/24/2016   Lab Results  Component Value Date   CHOL 189 09/06/2015   HDL 65 09/06/2015   LDLCALC 99 09/06/2015   TRIG 126 09/06/2015    Significant Diagnostic Results in last 30 days:  No results found.  Assessment/Plan Acute pain of right wrist Obtain X-ray R wrist ap, lateral, oblique views to r/o fxs. Apply Aspercreme qid to the right wrist x 1 week. May consider Uric acid, ESR, RAF, CBC/diff, CMP is no better. Continue Norco 5/'325mg'$  bid.  Observe.   Memory loss [facility] for care assistance, continue Donepezil '5mg'$  daily.   History of duodenal ulcer Stable, continue PPI  Overactive bladder Stable, continue Myrbetriq     Family/ staff Communication: plan of care reviewed with the patient and charge nurse.    Labs/tests ordered:  X-ray R wrist Ap, lateral, and oblique views.   Time spend 25 minutes.

## 2018-03-27 NOTE — Assessment & Plan Note (Signed)
[facility] for care assistance, continue Donepezil 5mg  daily.

## 2018-03-27 NOTE — Assessment & Plan Note (Signed)
Stable, continue Myrbetriq °

## 2018-04-02 DIAGNOSIS — S62014D Nondisplaced fracture of distal pole of navicular [scaphoid] bone of right wrist, subsequent encounter for fracture with routine healing: Secondary | ICD-10-CM | POA: Diagnosis not present

## 2018-04-02 DIAGNOSIS — M25512 Pain in left shoulder: Secondary | ICD-10-CM | POA: Diagnosis not present

## 2018-04-02 DIAGNOSIS — R278 Other lack of coordination: Secondary | ICD-10-CM | POA: Diagnosis not present

## 2018-04-03 DIAGNOSIS — M19012 Primary osteoarthritis, left shoulder: Secondary | ICD-10-CM | POA: Diagnosis not present

## 2018-04-03 DIAGNOSIS — S62024A Nondisplaced fracture of middle third of navicular [scaphoid] bone of right wrist, initial encounter for closed fracture: Secondary | ICD-10-CM | POA: Diagnosis not present

## 2018-04-04 DIAGNOSIS — S62014D Nondisplaced fracture of distal pole of navicular [scaphoid] bone of right wrist, subsequent encounter for fracture with routine healing: Secondary | ICD-10-CM | POA: Diagnosis not present

## 2018-04-04 DIAGNOSIS — M25512 Pain in left shoulder: Secondary | ICD-10-CM | POA: Diagnosis not present

## 2018-04-04 DIAGNOSIS — R278 Other lack of coordination: Secondary | ICD-10-CM | POA: Diagnosis not present

## 2018-04-07 DIAGNOSIS — S62014D Nondisplaced fracture of distal pole of navicular [scaphoid] bone of right wrist, subsequent encounter for fracture with routine healing: Secondary | ICD-10-CM | POA: Diagnosis not present

## 2018-04-07 DIAGNOSIS — R278 Other lack of coordination: Secondary | ICD-10-CM | POA: Diagnosis not present

## 2018-04-07 DIAGNOSIS — M25512 Pain in left shoulder: Secondary | ICD-10-CM | POA: Diagnosis not present

## 2018-04-09 ENCOUNTER — Encounter: Payer: Self-pay | Admitting: Internal Medicine

## 2018-04-09 DIAGNOSIS — R278 Other lack of coordination: Secondary | ICD-10-CM | POA: Diagnosis not present

## 2018-04-09 DIAGNOSIS — M25512 Pain in left shoulder: Secondary | ICD-10-CM | POA: Diagnosis not present

## 2018-04-09 DIAGNOSIS — S62014D Nondisplaced fracture of distal pole of navicular [scaphoid] bone of right wrist, subsequent encounter for fracture with routine healing: Secondary | ICD-10-CM | POA: Diagnosis not present

## 2018-04-11 ENCOUNTER — Non-Acute Institutional Stay: Payer: Medicare Other | Admitting: Nurse Practitioner

## 2018-04-11 ENCOUNTER — Encounter: Payer: Self-pay | Admitting: Nurse Practitioner

## 2018-04-11 DIAGNOSIS — F0390 Unspecified dementia without behavioral disturbance: Secondary | ICD-10-CM | POA: Diagnosis not present

## 2018-04-11 DIAGNOSIS — G8929 Other chronic pain: Secondary | ICD-10-CM | POA: Diagnosis not present

## 2018-04-11 DIAGNOSIS — M25512 Pain in left shoulder: Secondary | ICD-10-CM | POA: Diagnosis not present

## 2018-04-11 DIAGNOSIS — R278 Other lack of coordination: Secondary | ICD-10-CM | POA: Diagnosis not present

## 2018-04-11 DIAGNOSIS — M25532 Pain in left wrist: Secondary | ICD-10-CM | POA: Diagnosis not present

## 2018-04-11 DIAGNOSIS — M5440 Lumbago with sciatica, unspecified side: Secondary | ICD-10-CM | POA: Diagnosis not present

## 2018-04-11 DIAGNOSIS — S62014D Nondisplaced fracture of distal pole of navicular [scaphoid] bone of right wrist, subsequent encounter for fracture with routine healing: Secondary | ICD-10-CM | POA: Diagnosis not present

## 2018-04-11 NOTE — Assessment & Plan Note (Signed)
Stable, radiating to the right buttock, able to ambulates with walker as usual, continue Norco 5/325mg  bid.

## 2018-04-11 NOTE — Progress Notes (Signed)
Location:  [place] Room Number: 779 Place of Service:  AL[facility] [phone]) Provider:  Teirra Carapia, Lennie Odor  NP  Blanchie Serve, MD  Patient Care Team: Blanchie Serve, MD as PCP - General (Internal Medicine) [name] Quaid Yeakle X, NP as Nurse Practitioner (Nurse Practitioner) [name], MD as Attending Physician (Urology) [name], MD as Consulting Physician (Gastroenterology)  Extended Emergency Contact Information Primary Emergency Contact: Baggett,[name] Address: [place] [address] [name] [place] Phone: [phone] Relation: Son  Code Status:  DNR Goals of care: Advanced Directive information Advanced Directives 03/27/2018  Does Patient Have a Medical Advance Directive? Yes  Type of Advance Directive Living will;Out of facility DNR (pink MOST or yellow form)  Does patient want to make changes to medical advance directive? No - Patient declined  Copy of [place] in Chart? -  Pre-existing out of facility DNR order (yellow form or pink MOST form) -     Chief Complaint  Patient presents with  . Acute Visit    C/o-(L)  wrist pain and swelling    HPI:  Pt is a 84 y.o. female seen today for an acute visit for left wrist pain and swelling, duration and etiology are uncertain, HPI was provided with assistance of staff due to the patient's dementia. Left wrist pain was reproduced with AROM and PROM. The patient is taking Norcor 5/325mg  bid for chronic lower back pain.   03/27/18 X-ray 3 views R wrist: non displace fracture of the scaphoid waist of indeterminate age. No pain of the right wrist complained today.   Past Medical History:  Diagnosis Date  . Allergic rhinitis due to pollen 07/01/2009  . Anemia, unspecified 09/07/2010  . Arthritis   . Benign paroxysmal positional vertigo   . Candidiasis 01/01/2012  . Cramp of limb 01/11/2011  . Disorder of bone and cartilage, unspecified  11/10/2010  . Dizziness and giddiness 11/10/2010  . Insomnia, unspecified 09/07/2010  . Knee pain, left anterior   . Lumbago 07/01/2009  . Lump or mass in breast 08/01/2012  . Memory loss 01/22/2012  . Myalgia and myositis, unspecified 05/17/2011  . Myopathy, unspecified   . Osteoarthrosis, unspecified whether generalized or localized, lower leg   . Other abnormal glucose 03/23/2010  . Other chest pain 08/28/2012  . Other disorders of bone and cartilage(733.99) 09/19/2012  . Overactive bladder   . Rash    inner legs- ? med allergy- to see PCP if doesnt improve by end of week  . Rash and nonspecific skin eruption 10/28/2014  . Right bundle branch block   . Seborrheic keratoses 05/17/2011  . Spasm of muscle 01/11/2011  . Spinal stenosis, lumbar region, without neurogenic claudication   . Stress incontinence 07/08/2014  . Unspecified constipation 12/18/2011  . Unspecified tinnitus   . Unspecified urinary incontinence   . Urgency of urination   . Urinary frequency 09/18/2012   Past Surgical History:  Procedure Laterality Date  . ABDOMINAL HYSTERECTOMY    . APPENDECTOMY  1956  . BACK SURGERY     lumbar-- ? type  Dr Tonita Cong  . BREAST LUMPECTOMY  1993   breast cyst  . BREAST SURGERY     right lumpectomy  . CLEFT LIP REPAIR     with repair palate  . ESOPHAGOGASTRODUODENOSCOPY Left 03/10/2013   Procedure: ESOPHAGOGASTRODUODENOSCOPY (EGD);  Surgeon: Beryle Beams, MD;  Location: [facility];  Service: Endoscopy;  Laterality: Left;  . EYE SURGERY     bilateral cataract extraction with IOL  . TOTAL KNEE ARTHROPLASTY  12/13/2011   Procedure: TOTAL KNEE ARTHROPLASTY;  Surgeon: Johnn Hai, MD;  Location: [facility];  Service: Orthopedics;  Laterality: Left;    No Known Allergies  Outpatient Encounter Medications as of 04/11/2018  Medication Sig  . acetaminophen (TYLENOL) 325 MG tablet Take 650 mg by mouth every 4 (four) hours as needed.  . bisacodyl (DULCOLAX) 5 MG EC tablet Take 5 mg by mouth  daily as needed.   . donepezil (ARICEPT) 5 MG tablet Take 5 mg by mouth at bedtime.  Marland Kitchen HYDROcodone-acetaminophen (NORCO/VICODIN) 5-325 MG tablet Take 1 tablet by mouth 2 (two) times daily.  Marland Kitchen lidocaine (LMX) 4 % cream Apply 1 application topically 2 (two) times daily. Apply to left knee for 12 hours on and 12 hours off.  Marland Kitchen MYRBETRIQ 50 MG TB24 tablet Take 50 mg by mouth daily.   . pantoprazole (PROTONIX) 40 MG tablet Take 1 tablet (40 mg total) by mouth 2 (two) times daily.   No facility-administered encounter medications on file as of 04/11/2018.    ROS was provided with assistance of staff Review of Systems  Constitutional: Negative for activity change, appetite change, chills, diaphoresis, fatigue and fever.  HENT: Positive for hearing loss. Negative for congestion and voice change.   Respiratory: Negative for cough, shortness of breath and wheezing.   Cardiovascular: Negative for chest pain, palpitations and leg swelling.  Gastrointestinal: Negative for abdominal distention and abdominal pain.  Genitourinary: Positive for frequency. Negative for difficulty urinating, dysuria and urgency.  Musculoskeletal: Positive for arthralgias, back pain, gait problem and joint swelling.  Skin: Negative for color change and pallor.  Neurological: Negative for dizziness, speech difficulty, weakness and headaches.       Dementia.   Psychiatric/Behavioral: Positive for confusion. Negative for agitation, behavioral problems and sleep disturbance. The patient is not nervous/anxious.     Immunization History  Administered Date(s) Administered  . Influenza Whole 05/27/2012, 06/10/2013  . Influenza-Unspecified 06/14/2014, 05/26/2015, 06/13/2016, 06/17/2017  . Pneumococcal Conjugate-13 06/24/2017  . Pneumococcal Polysaccharide-23 10/03/2011  . Td 10/03/2011  . Zoster 08/28/2007   Pertinent  Health Maintenance Due  Topic Date Due  . INFLUENZA VACCINE  03/27/2018  . DEXA SCAN  Completed  . PNA vac Low  Risk Adult  Completed   Fall Risk  05/17/2017 09/15/2015 01/21/2014  Falls in the past year? No No No   Functional Status Survey:    Vitals:   04/11/18 1115  BP: 130/70  Pulse: 71  Resp: 18  Temp: (!) 97.1 F (36.2 C)  SpO2: 94%  Weight: 179 lb 12.8 oz (81.6 kg)  Height: 5\' 2"  (1.575 m)   Body mass index is 32.89 kg/m. Physical Exam  Constitutional: She appears well-developed and well-nourished.  HENT:  Head: Normocephalic and atraumatic.  Eyes: Pupils are equal, round, and reactive to light. EOM are normal.  Neck: Normal range of motion. Neck supple.  Cardiovascular: Normal rate and regular rhythm.  No murmur heard. Pulmonary/Chest: She has no wheezes. She has no rales.  Abdominal: Soft. Bowel sounds are normal.  Musculoskeletal: She exhibits edema and tenderness.  Left wrist pain with ROM, slightly warmth and swelling. Self transfer, ambulates with walker.   Neurological: She is alert. No cranial nerve deficit. She exhibits normal muscle tone. Coordination normal.  Oriented to person and place.   Skin: Skin is warm and dry.  Psychiatric: She has  a normal mood and affect. Her behavior is normal.    Labs reviewed: Recent Labs    12/19/17  NA 136*  K 4.2  BUN 20  CREATININE 0.9   Recent Labs    12/19/17  AST 15  ALT 15  ALKPHOS 108   Recent Labs    12/19/17  WBC 7.6  HGB 14.4  HCT 43  PLT 305   Lab Results  Component Value Date   TSH 1.56 12/19/2017   Lab Results  Component Value Date   HGBA1C 5.6 04/24/2016   Lab Results  Component Value Date   CHOL 189 09/06/2015   HDL 65 09/06/2015   LDLCALC 99 09/06/2015   TRIG 126 09/06/2015    Significant Diagnostic Results in last 30 days:  No results found.  Assessment/Plan Acute pain of left wrist Left wrist pain with ROM, mild swelling, not disabling. Will obtain X-ray left wrist in ap, lateral, oblique views to r/o fx. Continue Norco 5/325mg  bid for pain.   Lumbago Stable, radiating to the  right buttock, able to ambulates with walker as usual, continue Norco 5/325mg  bid.   Senile dementia Continue [facility] for care assistance, close supervision for safety. Continue Donepezil 5mg  qd for memory.      Family/ staff Communication: plan of care reviewed with the patient and charge nurse.   Labs/tests ordered:  X-ray ap, lateral, oblique views of the left wrist  Time spend 25 minutes.

## 2018-04-11 NOTE — Assessment & Plan Note (Signed)
Continue [facility] for care assistance, close supervision for safety. Continue Donepezil 5mg  qd for memory.

## 2018-04-11 NOTE — Assessment & Plan Note (Signed)
Left wrist pain with ROM, mild swelling, not disabling. Will obtain X-ray left wrist in ap, lateral, oblique views to r/o fx. Continue Norco 5/325mg  bid for pain.

## 2018-04-14 ENCOUNTER — Encounter: Payer: Self-pay | Admitting: Nurse Practitioner

## 2018-04-14 DIAGNOSIS — M25512 Pain in left shoulder: Secondary | ICD-10-CM | POA: Diagnosis not present

## 2018-04-14 DIAGNOSIS — S62014D Nondisplaced fracture of distal pole of navicular [scaphoid] bone of right wrist, subsequent encounter for fracture with routine healing: Secondary | ICD-10-CM | POA: Diagnosis not present

## 2018-04-14 DIAGNOSIS — R278 Other lack of coordination: Secondary | ICD-10-CM | POA: Diagnosis not present

## 2018-04-16 DIAGNOSIS — S62014D Nondisplaced fracture of distal pole of navicular [scaphoid] bone of right wrist, subsequent encounter for fracture with routine healing: Secondary | ICD-10-CM | POA: Diagnosis not present

## 2018-04-16 DIAGNOSIS — M25512 Pain in left shoulder: Secondary | ICD-10-CM | POA: Diagnosis not present

## 2018-04-16 DIAGNOSIS — R278 Other lack of coordination: Secondary | ICD-10-CM | POA: Diagnosis not present

## 2018-04-18 DIAGNOSIS — R278 Other lack of coordination: Secondary | ICD-10-CM | POA: Diagnosis not present

## 2018-04-18 DIAGNOSIS — M25512 Pain in left shoulder: Secondary | ICD-10-CM | POA: Diagnosis not present

## 2018-04-18 DIAGNOSIS — S62014D Nondisplaced fracture of distal pole of navicular [scaphoid] bone of right wrist, subsequent encounter for fracture with routine healing: Secondary | ICD-10-CM | POA: Diagnosis not present

## 2018-04-23 DIAGNOSIS — S62014D Nondisplaced fracture of distal pole of navicular [scaphoid] bone of right wrist, subsequent encounter for fracture with routine healing: Secondary | ICD-10-CM | POA: Diagnosis not present

## 2018-04-23 DIAGNOSIS — R278 Other lack of coordination: Secondary | ICD-10-CM | POA: Diagnosis not present

## 2018-04-23 DIAGNOSIS — M25512 Pain in left shoulder: Secondary | ICD-10-CM | POA: Diagnosis not present

## 2018-04-25 DIAGNOSIS — R278 Other lack of coordination: Secondary | ICD-10-CM | POA: Diagnosis not present

## 2018-04-25 DIAGNOSIS — S62014D Nondisplaced fracture of distal pole of navicular [scaphoid] bone of right wrist, subsequent encounter for fracture with routine healing: Secondary | ICD-10-CM | POA: Diagnosis not present

## 2018-04-25 DIAGNOSIS — M25512 Pain in left shoulder: Secondary | ICD-10-CM | POA: Diagnosis not present

## 2018-05-30 DIAGNOSIS — I10 Essential (primary) hypertension: Secondary | ICD-10-CM | POA: Diagnosis not present

## 2018-05-30 LAB — CBC AND DIFFERENTIAL
HCT: 39 (ref 36–46)
HEMOGLOBIN: 13.2 (ref 12.0–16.0)
PLATELETS: 376 (ref 150–399)
WBC: 11

## 2018-06-02 ENCOUNTER — Encounter: Payer: Self-pay | Admitting: Nurse Practitioner

## 2018-06-02 ENCOUNTER — Non-Acute Institutional Stay: Payer: Medicare Other | Admitting: Nurse Practitioner

## 2018-06-02 ENCOUNTER — Other Ambulatory Visit: Payer: Self-pay | Admitting: *Deleted

## 2018-06-02 DIAGNOSIS — M25532 Pain in left wrist: Secondary | ICD-10-CM | POA: Diagnosis not present

## 2018-06-02 DIAGNOSIS — M150 Primary generalized (osteo)arthritis: Secondary | ICD-10-CM

## 2018-06-02 DIAGNOSIS — M159 Polyosteoarthritis, unspecified: Secondary | ICD-10-CM

## 2018-06-02 DIAGNOSIS — W19XXXA Unspecified fall, initial encounter: Secondary | ICD-10-CM

## 2018-06-02 DIAGNOSIS — S60221A Contusion of right hand, initial encounter: Secondary | ICD-10-CM | POA: Diagnosis not present

## 2018-06-02 DIAGNOSIS — F0390 Unspecified dementia without behavioral disturbance: Secondary | ICD-10-CM

## 2018-06-02 NOTE — Assessment & Plan Note (Signed)
The last reported fall and urinary frequency 05/30/18, CBC: 05/30/18 wbc 11.0, Hgb 13.2, plt 376 neutrophils 78.9%, UA C/S none bacteria seen. The patient was noted to have the right hand 3rd, 4th MCP area redness, swelling, painful when palpated or making a fist, taking Norco 5/325mg  bid. Will apply BioFreeze qid to the back of the right hand reddened area x 2 weeks. X-ray 3 views of the right hand to r/o fxs. Observe.

## 2018-06-02 NOTE — Assessment & Plan Note (Signed)
Will continue Norco 5/325mg  bid. Observe.

## 2018-06-02 NOTE — Assessment & Plan Note (Addendum)
Last fall 05/30/18, continue close supervision for safety. Her lack of safety awareness related to dementia and increased frailty are contributory.

## 2018-06-02 NOTE — Progress Notes (Signed)
Location:  [facility] Room Number: 810 Place of Service:  ALF [phone]) Provider:  Nandika Stetzer, Lennie Odor  NP  Blanchie Serve, MD  Patient Care Team: Blanchie Serve, MD as PCP - General (Internal Medicine) [name] Seleena Reimers X, NP as Nurse Practitioner (Nurse Practitioner) [name], MD as Attending Physician (Urology) [name], MD as Consulting Physician (Gastroenterology)  Extended Emergency Contact Information Primary Emergency Contact: Hirsch,[name] Address: [place] [address] [name] of [place] Phone: [phone] Relation: Son  Code Status:  DNR Goals of care: Advanced Directive information Advanced Directives 06/02/2018  Does Patient Have a Medical Advance Directive? Yes  Type of Advance Directive Living will;Out of facility DNR (pink MOST or yellow form)  Does patient want to make changes to medical advance directive? No - Patient declined  Copy of [place] in Chart? -  Pre-existing out of facility DNR order (yellow form or pink MOST form) Yellow form placed in chart (order not valid for inpatient use)     Chief Complaint  Patient presents with  . Acute Visit    (L) wrist pain    HPI:  Pt is a 84 y.o. female seen today for an acute visit for reported the left wrist pain 06/01/18, X-ray left wrist: no acute fracture or dislocation, severe OA. It was no noted redness, swelling or fever in the left wrist. The last reported fall and urinary frequency 05/30/18, CBC: 05/30/18 wbc 11.0, Hgb 13.2, plt 376 neutrophils 78.9%, UA C/S none bacteria seen. The patient was noted to have the right hand 3rd, 4th MCP area redness, swelling, painful when palpated or making a fist, taking Norco 5/325mg  bid. HPI was provided with assistance of staff, she resides in [place] for safety and care assistance, taking Donepezil 5mg  qd for memory.    Past Medical History:  Diagnosis Date  . Allergic  rhinitis due to pollen 07/01/2009  . Anemia, unspecified 09/07/2010  . Arthritis   . Benign paroxysmal positional vertigo   . Candidiasis 01/01/2012  . Cramp of limb 01/11/2011  . Disorder of bone and cartilage, unspecified 11/10/2010  . Dizziness and giddiness 11/10/2010  . Insomnia, unspecified 09/07/2010  . Knee pain, left anterior   . Lumbago 07/01/2009  . Lump or mass in breast 08/01/2012  . Memory loss 01/22/2012  . Myalgia and myositis, unspecified 05/17/2011  . Myopathy, unspecified   . Osteoarthrosis, unspecified whether generalized or localized, lower leg   . Other abnormal glucose 03/23/2010  . Other chest pain 08/28/2012  . Other disorders of bone and cartilage(733.99) 09/19/2012  . Overactive bladder   . Rash    inner legs- ? med allergy- to see PCP if doesnt improve by end of week  . Rash and nonspecific skin eruption 10/28/2014  . Right bundle branch block   . Seborrheic keratoses 05/17/2011  . Spasm of muscle 01/11/2011  . Spinal stenosis, lumbar region, without neurogenic claudication   . Stress incontinence 07/08/2014  . Unspecified constipation 12/18/2011  . Unspecified tinnitus   . Unspecified urinary incontinence   . Urgency of urination   . Urinary frequency 09/18/2012   Past Surgical History:  Procedure Laterality Date  . ABDOMINAL HYSTERECTOMY    . APPENDECTOMY  1956  . BACK SURGERY     lumbar-- ? type  Dr Tonita Cong  . BREAST LUMPECTOMY  1993   breast cyst  . BREAST SURGERY  right lumpectomy  . CLEFT LIP REPAIR     with repair palate  . ESOPHAGOGASTRODUODENOSCOPY Left 03/10/2013   Procedure: ESOPHAGOGASTRODUODENOSCOPY (EGD);  Surgeon: Beryle Beams, MD;  Location: [facility];  Service: Endoscopy;  Laterality: Left;  . EYE SURGERY     bilateral cataract extraction with IOL  . TOTAL KNEE ARTHROPLASTY  12/13/2011   Procedure: TOTAL KNEE ARTHROPLASTY;  Surgeon: Johnn Hai, MD;  Location: [facility];  Service: Orthopedics;  Laterality: Left;    No Known  Allergies  Outpatient Encounter Medications as of 06/02/2018  Medication Sig  . acetaminophen (TYLENOL) 325 MG tablet Take 650 mg by mouth every 4 (four) hours as needed.  . bisacodyl (DULCOLAX) 5 MG EC tablet Take 5 mg by mouth daily as needed.   . donepezil (ARICEPT) 5 MG tablet Take 5 mg by mouth at bedtime.  Marland Kitchen HYDROcodone-acetaminophen (NORCO/VICODIN) 5-325 MG tablet Take 1 tablet by mouth 2 (two) times daily.  Marland Kitchen lidocaine (LMX) 4 % cream Apply 1 application topically 2 (two) times daily. Apply to left knee for 12 hours on and 12 hours off.  Marland Kitchen MYRBETRIQ 50 MG TB24 tablet Take 50 mg by mouth daily.   . pantoprazole (PROTONIX) 40 MG tablet Take 1 tablet (40 mg total) by mouth 2 (two) times daily.   No facility-administered encounter medications on file as of 06/02/2018.    ROS was provided with assistance of staff Review of Systems  Constitutional: Negative for activity change, appetite change, chills, diaphoresis and fatigue.  HENT: Positive for hearing loss. Negative for congestion and voice change.   Respiratory: Negative for cough, shortness of breath and wheezing.   Cardiovascular: Negative for chest pain, palpitations and leg swelling.  Gastrointestinal: Negative for abdominal distention, abdominal pain, constipation, diarrhea, nausea and vomiting.  Genitourinary: Negative for difficulty urinating, dysuria and urgency.  Musculoskeletal: Positive for arthralgias, back pain, gait problem and joint swelling.       The right 3rd and 4th MCP joints  Skin: Positive for color change.       The  Back of the right hand.   Neurological: Negative for dizziness, facial asymmetry, speech difficulty, weakness and headaches.       Dementia  Psychiatric/Behavioral: Positive for confusion. Negative for agitation, behavioral problems, hallucinations and sleep disturbance. The patient is not nervous/anxious.     Immunization History  Administered Date(s) Administered  . Influenza Whole  05/27/2012, 06/10/2013  . Influenza-Unspecified 06/14/2014, 05/26/2015, 06/13/2016, 06/17/2017  . Pneumococcal Conjugate-13 06/24/2017  . Pneumococcal Polysaccharide-23 10/03/2011  . Td 10/03/2011  . Zoster 08/28/2007   Pertinent  Health Maintenance Due  Topic Date Due  . INFLUENZA VACCINE  03/27/2018  . DEXA SCAN  Completed  . PNA vac Low Risk Adult  Completed   Fall Risk  05/17/2017 09/15/2015 01/21/2014  Falls in the past year? No No No   Functional Status Survey:    Vitals:   06/02/18 1154  BP: 130/70  Pulse: 93  Resp: 20  Temp: 97.6 F (36.4 C)  SpO2: 95%  Weight: 176 lb (79.8 kg)  Height: 5\' 2"  (1.575 m)   Body mass index is 32.19 kg/m. Physical Exam  Constitutional: She appears well-developed and well-nourished.  HENT:  Head: Normocephalic and atraumatic.  Eyes: Pupils are equal, round, and reactive to light. EOM are normal.  Neck: Normal range of motion. Neck supple. No JVD present. No thyromegaly present.  Cardiovascular: Normal rate and regular rhythm.  No murmur heard. Pulmonary/Chest: Effort normal. She has  no wheezes. She has no rales.  Abdominal: Soft. She exhibits no distension. There is no tenderness. There is no rebound and no guarding.  Musculoskeletal: She exhibits edema and tenderness.  The right 3rd/4th MCP joint swelling, redness, painful when making a fist.   Neurological: She is alert. No cranial nerve deficit. She exhibits normal muscle tone. Coordination normal.  Oriented to person and her room on unit.   Skin: Skin is warm and dry. There is erythema.  The dorsum of the right hand at the 3rd and 4th MCP joints area .  Psychiatric: She has a normal mood and affect. Her behavior is normal.    Labs reviewed: Recent Labs    12/19/17  NA 136*  K 4.2  BUN 20  CREATININE 0.9   Recent Labs    12/19/17  AST 15  ALT 15  ALKPHOS 108   Recent Labs    12/19/17 05/30/18  WBC 7.6 11.0  HGB 14.4 13.2  HCT 43 39  PLT 305 376   Lab Results   Component Value Date   TSH 1.56 12/19/2017   Lab Results  Component Value Date   HGBA1C 5.6 04/24/2016   Lab Results  Component Value Date   CHOL 189 09/06/2015   HDL 65 09/06/2015   LDLCALC 99 09/06/2015   TRIG 126 09/06/2015    Significant Diagnostic Results in last 30 days:  No results found.  Assessment/Plan Contusion of right hand The last reported fall and urinary frequency 05/30/18, CBC: 05/30/18 wbc 11.0, Hgb 13.2, plt 376 neutrophils 78.9%, UA C/S none bacteria seen. The patient was noted to have the right hand 3rd, 4th MCP area redness, swelling, painful when palpated or making a fist, taking Norco 5/325mg  bid. Will apply BioFreeze qid to the back of the right hand reddened area x 2 weeks. X-ray 3 views of the right hand to r/o fxs. Observe.   Osteoarthritis, multiple sites Will continue Norco 5/325mg  bid. Observe.   Senile dementia Will continue [facility] for safety and care assistance, continue Donepezil 5mg  po qd.   Acute pain of left wrist the left wrist pain 06/01/18, X-ray left wrist: no acute fracture or dislocation, severe OA. It was no noted redness, swelling or fever in the left wrist. The last reported fall and urinary frequency 05/30/18, CBC: 05/30/18 wbc 11.0, Hgb 13.2, plt 376 neutrophils 78.9%, continue to observe.   Fall Last fall 05/30/18, continue close supervision for safety. Her lack of safety awareness related to dementia and increased frailty are contributory.      Family/ staff Communication: plan of care reviewed with the patient and charge nurse.   Labs/tests ordered:  X-ray 3 views right hand  Time spend 25 minutes.

## 2018-06-02 NOTE — Assessment & Plan Note (Signed)
the left wrist pain 06/01/18, X-ray left wrist: no acute fracture or dislocation, severe OA. It was no noted redness, swelling or fever in the left wrist. The last reported fall and urinary frequency 05/30/18, CBC: 05/30/18 wbc 11.0, Hgb 13.2, plt 376 neutrophils 78.9%, continue to observe.

## 2018-06-02 NOTE — Assessment & Plan Note (Signed)
Will continue [facility] for safety and care assistance, continue Donepezil 5mg  po qd.

## 2018-06-03 DIAGNOSIS — M79641 Pain in right hand: Secondary | ICD-10-CM | POA: Diagnosis not present

## 2018-06-20 DIAGNOSIS — M25511 Pain in right shoulder: Secondary | ICD-10-CM | POA: Diagnosis not present

## 2018-06-20 DIAGNOSIS — M19012 Primary osteoarthritis, left shoulder: Secondary | ICD-10-CM | POA: Diagnosis not present

## 2018-06-30 ENCOUNTER — Non-Acute Institutional Stay: Payer: Medicare Other | Admitting: Family Medicine

## 2018-06-30 ENCOUNTER — Encounter: Payer: Self-pay | Admitting: Family Medicine

## 2018-06-30 DIAGNOSIS — M159 Polyosteoarthritis, unspecified: Secondary | ICD-10-CM

## 2018-06-30 DIAGNOSIS — K269 Duodenal ulcer, unspecified as acute or chronic, without hemorrhage or perforation: Secondary | ICD-10-CM

## 2018-06-30 DIAGNOSIS — M150 Primary generalized (osteo)arthritis: Secondary | ICD-10-CM

## 2018-06-30 DIAGNOSIS — F0390 Unspecified dementia without behavioral disturbance: Secondary | ICD-10-CM

## 2018-06-30 NOTE — Progress Notes (Signed)
Provider:  Alain Honey, MD Location:  [facility] Room Number: 161 Place of Service:  ALF (13)  PCP: Mast, Man X, NP Patient Care Team: Mast, Man X, NP as PCP - General (Internal Medicine) [name] Mast, Man X, NP as Nurse Practitioner (Nurse Practitioner) [name], MD as Attending Physician (Urology) [name], MD as Consulting Physician (Gastroenterology)  Extended Emergency Contact Information Primary Emergency Contact: Payer,[name] Address: [place] [address] [name] [place] Phone: [phone] Relation: Son  Code Status: DNR Goals of Care: Advanced Directive information Advanced Directives 06/30/2018  Does Patient Have a Medical Advance Directive? Yes  Type of Advance Directive Living will;Out of facility DNR (pink MOST or yellow form)  Does patient want to make changes to medical advance directive? No - Patient declined  Copy of [place] in Chart? -  Pre-existing out of facility DNR order (yellow form or pink MOST form) Yellow form placed in chart (order not valid for inpatient use)      Chief Complaint  Patient presents with  . Medical Management of Chronic Issues    F/u- OAB, (R) pain to wrist/hand, memory loss    HPI: Patient is a 84 y.o. female seen today for medical management of chronic problems including: Wrist pain secondary to osteoarthritis, overactive bladder, Tneshia and GERD.  Patient seen in her room with nursing.  She is very pleasant but confused.  She does complain today about her right wrist.  She takes hydrocodone twice daily for that pain; there are no problems with constipation.  For the confusion she is on small dose of Aricept 5 mg at bedtime.  Overactive bladder is treated with Myrbetriq 50 mg  Past Medical History:  Diagnosis Date  . Allergic rhinitis due to pollen 07/01/2009  . Anemia, unspecified 09/07/2010  . Arthritis   .  Benign paroxysmal positional vertigo   . Candidiasis 01/01/2012  . Cramp of limb 01/11/2011  . Disorder of bone and cartilage, unspecified 11/10/2010  . Dizziness and giddiness 11/10/2010  . Insomnia, unspecified 09/07/2010  . Knee pain, left anterior   . Lumbago 07/01/2009  . Lump or mass in breast 08/01/2012  . Memory loss 01/22/2012  . Myalgia and myositis, unspecified 05/17/2011  . Myopathy, unspecified   . Osteoarthrosis, unspecified whether generalized or localized, lower leg   . Other abnormal glucose 03/23/2010  . Other chest pain 08/28/2012  . Other disorders of bone and cartilage(733.99) 09/19/2012  . Overactive bladder   . Rash    inner legs- ? med allergy- to see PCP if doesnt improve by end of week  . Rash and nonspecific skin eruption 10/28/2014  . Right bundle branch block   . Seborrheic keratoses 05/17/2011  . Spasm of muscle 01/11/2011  . Spinal stenosis, lumbar region, without neurogenic claudication   . Stress incontinence 07/08/2014  . Unspecified constipation 12/18/2011  . Unspecified tinnitus   . Unspecified urinary incontinence   . Urgency of urination   . Urinary frequency 09/18/2012   Past Surgical History:  Procedure Laterality Date  . ABDOMINAL HYSTERECTOMY    . APPENDECTOMY  1956  . BACK SURGERY     lumbar-- ? type  Dr Tonita Cong  . BREAST LUMPECTOMY  1993   breast cyst  . BREAST SURGERY     right lumpectomy  . CLEFT LIP REPAIR     with repair palate  . ESOPHAGOGASTRODUODENOSCOPY  Left 03/10/2013   Procedure: ESOPHAGOGASTRODUODENOSCOPY (EGD);  Surgeon: Beryle Beams, MD;  Location: [facility];  Service: Endoscopy;  Laterality: Left;  . EYE SURGERY     bilateral cataract extraction with IOL  . TOTAL KNEE ARTHROPLASTY  12/13/2011   Procedure: TOTAL KNEE ARTHROPLASTY;  Surgeon: Johnn Hai, MD;  Location: [facility];  Service: Orthopedics;  Laterality: Left;    reports that she has never smoked. She has never used smokeless tobacco. She reports that she does not  drink alcohol or use drugs. Social History   Socioeconomic History  . Marital status: Divorced    Spouse name: Not on file  . Number of children: Not on file  . Years of education: Not on file  . Highest education level: Not on file  Occupational History  . Occupation: retired Fish farm manager  Social Needs  . Financial resource strain: Not on file  . Food insecurity:    Worry: Not on file    Inability: Not on file  . Transportation needs:    Medical: Not on file    Non-medical: Not on file  Tobacco Use  . Smoking status: Never Smoker  . Smokeless tobacco: Never Used  Substance and Sexual Activity  . Alcohol use: No    Comment: socially   wine  . Drug use: No  . Sexual activity: Never    Birth control/protection: Post-menopausal  Lifestyle  . Physical activity:    Days per week: Not on file    Minutes per session: Not on file  . Stress: Not on file  Relationships  . Social connections:    Talks on phone: Not on file    Gets together: Not on file    Attends religious service: Not on file    Active member of club or organization: Not on file    Attends meetings of clubs or organizations: Not on file    Relationship status: Not on file  . Intimate partner violence:    Fear of current or ex partner: Not on file    Emotionally abused: Not on file    Physically abused: Not on file    Forced sexual activity: Not on file  Other Topics Concern  . Not on file  Social History Narrative   Lives at [facility] since 08/2007   Divorce    Never smoked   Alcohol -rare   Exercise none    Living Will    Functional Status Survey:    No family history on file.  Health Maintenance  Topic Date Due  . TETANUS/TDAP  10/02/2021  . INFLUENZA VACCINE  Completed  . DEXA SCAN  Completed  . PNA vac Low Risk Adult  Completed    No Known Allergies  Outpatient Encounter Medications as of 06/30/2018  Medication Sig  . acetaminophen (TYLENOL) 500 MG tablet Take 500 mg by  mouth 2 (two) times daily.  . bisacodyl (DULCOLAX) 5 MG EC tablet Take 5 mg by mouth daily as needed.   . donepezil (ARICEPT) 5 MG tablet Take 5 mg by mouth at bedtime.  Marland Kitchen HYDROcodone-acetaminophen (NORCO/VICODIN) 5-325 MG tablet Take 1 tablet by mouth 2 (two) times daily.  Marland Kitchen lidocaine (LMX) 4 % cream Apply 1 application topically 2 (two) times daily. Apply to left knee for 12 hours on and 12 hours off.  Marland Kitchen MYRBETRIQ 50 MG TB24 tablet Take 50 mg by mouth daily.   . pantoprazole (PROTONIX) 40 MG tablet Take 1 tablet (40 mg total)  by mouth 2 (two) times daily.  . [DISCONTINUED] acetaminophen (TYLENOL) 325 MG tablet Take 650 mg by mouth every 4 (four) hours as needed.   No facility-administered encounter medications on file as of 06/30/2018.     Review of Systems  Constitutional: Negative.   HENT: Negative.   Respiratory: Negative.   Cardiovascular: Negative.   Gastrointestinal: Positive for abdominal pain.  Genitourinary: Positive for frequency.  Musculoskeletal: Positive for arthralgias.  Neurological: Negative.   Psychiatric/Behavioral: Positive for confusion.    Vitals:   06/30/18 1533  BP: 134/78  Pulse: 76  Resp: 18  Temp: 98 F (36.7 C)  SpO2: 94%  Weight: 172 lb (78 kg)  Height: 5\' 2"  (1.575 m)   Body mass index is 31.46 kg/m. Physical Exam  Constitutional: She appears well-developed and well-nourished.  HENT:  Head: Normocephalic.  Mouth/Throat: Oropharynx is clear and moist.  Eyes: Pupils are equal, round, and reactive to light.  Neck: Normal range of motion.  Cardiovascular: Normal rate, regular rhythm and normal heart sounds.  Pulmonary/Chest: Effort normal.  Abdominal: Soft.  Musculoskeletal:  There is some swelling in her right wrist.  She is also at times complaining of left wrist pain.  There are no deformities in her fingers or hand consistent with her rheumatoid disease. Patient ambulates using her cane as well as walker  Neurological: She is alert.    Oriented x2 She is able to carry on conversation that short-term memory is present  Psychiatric: She has a normal mood and affect. Her behavior is normal.  Nursing note and vitals reviewed.   Labs reviewed: Basic Metabolic Panel: Recent Labs    12/19/17  NA 136*  K 4.2  BUN 20  CREATININE 0.9   Liver Function Tests: Recent Labs    12/19/17  AST 15  ALT 15  ALKPHOS 108   No results for input(s): LIPASE, AMYLASE in the last 8760 hours. No results for input(s): AMMONIA in the last 8760 hours. CBC: Recent Labs    12/19/17 05/30/18  WBC 7.6 11.0  HGB 14.4 13.2  HCT 43 39  PLT 305 376   Cardiac Enzymes: No results for input(s): CKTOTAL, CKMB, CKMBINDEX, TROPONINI in the last 8760 hours. BNP: Invalid input(s): POCBNP Lab Results  Component Value Date   HGBA1C 5.6 04/24/2016   Lab Results  Component Value Date   TSH 1.56 12/19/2017   Lab Results  Component Value Date   VITAMINB12 365 12/19/2017   No results found for: FOLATE No results found for: IRON, TIBC, FERRITIN  Imaging and Procedures obtained prior to SNF admission: Nm Renal Imaging Flow W/pharm  Result Date: 04/06/2013 *RADIOLOGY REPORT* Clinical Data:  Right-sided hydronephrosis on CT. NUCLEAR MEDICINE RENAL SCAN WITH DIURETIC ADMINISTRATION Technique:  Radionuclide angiographic and sequential renal images were obtained after intravenous injection of radiopharmaceutical. Imaging was continued during slow intravenous injection of Lasix approximately 15 minutes after the start of the examination. Radiopharmaceutical:  16 mCi Tc-10m MAG3 Comparison:  Renal ultrasound 09/23/2012.  Abdominal pelvic CT 03/10/2013. Findings: Perfusion images demonstrate early symmetric perfusion to the kidneys.  Differential renal uptake at 2-3 minutes is 50.5% on the right and 49.5% on the left. Renogram images demonstrate normal renal cortical uptake and excretion bilaterally.  There is mild to moderate dilatation of the right  renal pelvis with retained contrast prior to Lasix administration.  However, this contrast washes out following Lasix. There is no significant retained contrast within the collecting system post voiding. The right renogram curve is  downsloping following Lasix. Differential renal function is 50.2% right and 49.8% left.  Post Lasix T 1/2 times are 3.5 minutes on the right and 6.0 minutes on the left. IMPRESSION: 1.  Dilatation of the right renal pelvis with good washout of activity following Lasix.  No significant ureteral obstruction demonstrated. 2.  Differential renal function is nearly symmetric. Original Report Authenticated By: Richardean Sale, M.D.    Assessment/Plan 1. Duodenal ulcer without hemorrhage or perforation Patient maintained on Protonix 40 mg twice daily  2. Senile dementia without behavioral disturbance ([place]) Pleasantly confused.  Continue on Aricept 5 mg  3. Primary osteoarthritis involving multiple joints Condition is treated with hydrocodone twice daily   Family/ staff Communication:   Labs/tests ordered: none  Lillette Boxer. Sabra Heck, [place] [address] [place], [place] Office [phone]

## 2018-07-08 NOTE — Addendum Note (Signed)
Addended by: Wardell Honour on: 07/08/2018 10:22 AM   Modules accepted: Level of Service

## 2018-09-10 ENCOUNTER — Other Ambulatory Visit: Payer: Self-pay | Admitting: *Deleted

## 2018-09-10 DIAGNOSIS — M150 Primary generalized (osteo)arthritis: Principal | ICD-10-CM

## 2018-09-10 DIAGNOSIS — M159 Polyosteoarthritis, unspecified: Secondary | ICD-10-CM

## 2018-09-10 MED ORDER — HYDROCODONE-ACETAMINOPHEN 5-325 MG PO TABS: 1.0000 | ORAL_TABLET | Freq: Two times a day (BID) | ORAL | 0 refills | Status: DC

## 2018-10-10 ENCOUNTER — Other Ambulatory Visit: Payer: Self-pay | Admitting: *Deleted

## 2018-10-10 DIAGNOSIS — M150 Primary generalized (osteo)arthritis: Principal | ICD-10-CM

## 2018-10-10 DIAGNOSIS — M159 Polyosteoarthritis, unspecified: Secondary | ICD-10-CM

## 2018-10-10 MED ORDER — HYDROCODONE-ACETAMINOPHEN 5-325 MG PO TABS: 1.0000 | ORAL_TABLET | Freq: Two times a day (BID) | ORAL | 0 refills | Status: DC

## 2018-10-17 ENCOUNTER — Non-Acute Institutional Stay: Payer: Medicare Other | Admitting: Nurse Practitioner

## 2018-10-17 ENCOUNTER — Encounter: Payer: Self-pay | Admitting: Nurse Practitioner

## 2018-10-17 DIAGNOSIS — M150 Primary generalized (osteo)arthritis: Secondary | ICD-10-CM | POA: Diagnosis not present

## 2018-10-17 DIAGNOSIS — N3281 Overactive bladder: Secondary | ICD-10-CM

## 2018-10-17 DIAGNOSIS — K257 Chronic gastric ulcer without hemorrhage or perforation: Secondary | ICD-10-CM | POA: Diagnosis not present

## 2018-10-17 DIAGNOSIS — F0390 Unspecified dementia without behavioral disturbance: Secondary | ICD-10-CM

## 2018-10-17 DIAGNOSIS — M159 Polyosteoarthritis, unspecified: Secondary | ICD-10-CM

## 2018-10-17 NOTE — Assessment & Plan Note (Addendum)
Continue [facility] for safety and care assistance, continue Donepezil '5mg'$  qd. Update CBC CMP/eGFR

## 2018-10-17 NOTE — Progress Notes (Signed)
Location:  [place] Room Number: 908 Place of Service:  ALF (13) Provider:  Marlana Latus  NP  ,  X, NP  Patient Care Team: ,  X, NP as PCP - General (Internal Medicine) [name] ,  X, NP as Nurse Practitioner (Nurse Practitioner) [name], MD as Attending Physician (Urology) [name], MD as Consulting Physician (Gastroenterology)  Extended Emergency Contact Information Primary Emergency Contact: Arroyave,[name] Address: [place] [address] [name] of [place] Phone: [phone] Relation: Son  Code Status:  DNR Goals of care: Advanced Directive information Advanced Directives 10/17/2018  Does Patient Have a Medical Advance Directive? Yes  Type of Advance Directive Living will;Out of facility DNR (pink MOST or yellow form)  Does patient want to make changes to medical advance directive? No - Patient declined  Copy of [place] in Chart? -  Pre-existing out of facility DNR order (yellow form or pink MOST form) Yellow form placed in chart (order not valid for inpatient use)     Chief Complaint  Patient presents with  . Medical agement of Chronic Issues    HPI:  Pt is a 85 y.o. female seen today for medical management of chronic diseases.     The patient resides in [place] for safety and care assistance, on Donepezil 38m qd for memory. OAB, no urinary retention, on Myrbetriq 524mqd. GERD stable on Pantoprazole 4062md. OA pain, knees/lower back, stable on Noroc 5/325m29md, Tylenol 500mg8m.    Past Medical History:  Diagnosis Date  . Allergic rhinitis due to pollen 07/01/2009  . Anemia, unspecified 09/07/2010  . Arthritis   . Benign paroxysmal positional vertigo   . Candidiasis 01/01/2012  . Cramp of limb 01/11/2011  . Disorder of bone and cartilage, unspecified 11/10/2010  . Dizziness and giddiness 11/10/2010  . Insomnia, unspecified 09/07/2010    . Knee pain, left anterior   . Lumbago 07/01/2009  . Lump or mass in breast 08/01/2012  . Memory loss 01/22/2012  . Myalgia and myositis, unspecified 05/17/2011  . Myopathy, unspecified   . Osteoarthrosis, unspecified whether generalized or localized, lower leg   . Other abnormal glucose 03/23/2010  . Other chest pain 08/28/2012  . Other disorders of bone and cartilage(733.99) 09/19/2012  . Overactive bladder   . Rash    inner legs- ? med allergy- to see PCP if doesnt improve by end of week  . Rash and nonspecific skin eruption 10/28/2014  . Right bundle branch block   . Seborrheic keratoses 05/17/2011  . Spasm of muscle 01/11/2011  . Spinal stenosis, lumbar region, without neurogenic claudication   . Stress incontinence 07/08/2014  . Unspecified constipation 12/18/2011  . Unspecified tinnitus   . Unspecified urinary incontinence   . Urgency of urination   . Urinary frequency 09/18/2012   Past Surgical History:  Procedure Laterality Date  . ABDOMINAL HYSTERECTOMY    . APPENDECTOMY  1956  . BACK SURGERY     lumbar-- ? type  Dr BeaneTonita CongREAST LUMPECTOMY  1993   breast cyst  . BREAST SURGERY     right lumpectomy  . CLEFT LIP REPAIR     with repair palate  . ESOPHAGOGASTRODUODENOSCOPY Left 03/10/2013   Procedure: ESOPHAGOGASTRODUODENOSCOPY (EGD);  Surgeon: PatriBeryle Beams  [facility];  Service: Endoscopy;  Laterality: Left;  . EYE SURGERY     bilateral cataract  extraction with IOL  . TOTAL KNEE ARTHROPLASTY  12/13/2011   Procedure: TOTAL KNEE ARTHROPLASTY;  Surgeon: Johnn Hai, MD;  Location: [facility];  Service: Orthopedics;  Laterality: Left;    No Known Allergies  Outpatient Encounter Medications as of 10/17/2018  Medication Sig  . acetaminophen (TYLENOL) 500 MG tablet Take 500 mg by mouth 2 (two) times daily.  . bisacodyl (DULCOLAX) 5 MG EC tablet Take 5 mg by mouth daily as needed.   . donepezil (ARICEPT) 5 MG tablet Take 5 mg by mouth at bedtime.  Marland Kitchen  HYDROcodone-acetaminophen (NORCO/VICODIN) 5-325 MG tablet Take 1 tablet by mouth 2 (two) times daily.  . Lidocaine 4 % PTCH Apply topically. Apply patch to left knee on for 12 hours and off for 12 hours.  Marland Kitchen MYRBETRIQ 50 MG TB24 tablet Take 50 mg by mouth daily.   . pantoprazole (PROTONIX) 40 MG tablet Take 1 tablet (40 mg total) by mouth 2 (two) times daily.  . [DISCONTINUED] lidocaine (LMX) 4 % cream Apply 1 application topically 2 (two) times daily. Apply to left knee for 12 hours on and 12 hours off.   No facility-administered encounter medications on file as of 10/17/2018.    ROS was provided with assistance of staff Review of Systems  Constitutional: Negative for activity change, appetite change, chills, diaphoresis, fatigue, fever and unexpected weight change.  HENT: Positive for hearing loss. Negative for congestion and voice change.   Respiratory: Negative for apnea, cough and shortness of breath.   Cardiovascular: Negative.  Negative for chest pain, palpitations and leg swelling.  Gastrointestinal: Negative for abdominal distention, constipation, diarrhea, nausea and vomiting.  Genitourinary: Positive for frequency. Negative for difficulty urinating, dysuria and urgency.  Musculoskeletal: Positive for arthralgias, back pain and gait problem.  Skin: Negative for color change and pallor.  Neurological: Negative for dizziness, speech difficulty, weakness and headaches.       Dementia  Psychiatric/Behavioral: Negative for agitation, behavioral problems, hallucinations and sleep disturbance. The patient is not nervous/anxious.     Immunization History  Administered Date(s) Administered  . Influenza Whole 05/27/2012, 06/10/2013, 06/06/2018  . Influenza-Unspecified 06/14/2014, 05/26/2015, 06/13/2016, 06/17/2017  . Pneumococcal Conjugate-13 06/24/2017  . Pneumococcal Polysaccharide-23 10/03/2011  . Td 10/03/2011  . Zoster 08/28/2007   Pertinent  Health Maintenance Due  Topic Date Due   . INFLUENZA VACCINE  Completed  . DEXA SCAN  Completed  . PNA vac Low Risk Adult  Completed   Fall Risk  05/17/2017 09/15/2015 01/21/2014  Falls in the past year? No No No   Functional Status Survey:    Vitals:   10/17/18 1026  BP: 138/80  Pulse: 78  Resp: 19  Temp: 97.6 F (36.4 C)  SpO2: 98%  Weight: 173 lb 9.6 oz (78.7 kg)  Height: _0  (1.575 m)   Body mass index is 31.75 kg/m. Physical Exam Constitutional:      General: She is not in acute distress.    Appearance: Normal appearance. She is obese. She is not ill-appearing, toxic-appearing or diaphoretic.  HENT:     Head: Normocephalic and atraumatic.     Nose: Nose normal.     Mouth/Throat:     Mouth: Mucous membranes are moist.  Eyes:     Extraocular Movements: Extraocular movements intact.     Pupils: Pupils are equal, round, and reactive to light.  Neck:     Musculoskeletal: Normal range of motion and neck supple.  Cardiovascular:     Rate and Rhythm: Normal  rate and regular rhythm.     Heart sounds: No murmur.  Pulmonary:     Effort: Pulmonary effort is normal.     Breath sounds: Normal breath sounds. No wheezing, rhonchi or rales.  Abdominal:     Palpations: Abdomen is soft.     Tenderness: There is no abdominal tenderness. There is no guarding or rebound.  Musculoskeletal:     Right lower leg: No edema.     Left lower leg: No edema.     Comments: Ambulates with cane and walker. Chronic pain in lower back, knees, wrists.   Skin:    General: Skin is warm and dry.  Neurological:     General: No focal deficit present.     Mental Status: She is alert. Mental status is at baseline.     Cranial Nerves: Cranial nerve deficit present.     Motor: No weakness.     Coordination: Coordination normal.     Gait: Gait abnormal.     Comments: Oriented to person and place.   Psychiatric:        Mood and Affect: Mood normal.        Behavior: Behavior normal.     Labs reviewed: Recent Labs    12/19/17  NA  136*  K 4.2  BUN 20  CREATININE 0.9   Recent Labs    12/19/17  AST 15  ALT 15  ALKPHOS 108   Recent Labs    12/19/17 05/30/18  WBC 7.6 11.0  HGB 14.4 13.2  HCT 43 39  PLT 305 376   Lab Results  Component Value Date   TSH 1.56 12/19/2017   Lab Results  Component Value Date   HGBA1C 5.6 04/24/2016   Lab Results  Component Value Date   CHOL 189 09/06/2015   HDL 65 09/06/2015   LDLCALC 99 09/06/2015   TRIG 126 09/06/2015    Significant Diagnostic Results in last 30 days:  No results found.  Assessment/Plan Gastric erosions Stable, continue Pantoprazole 41m qd.   Dementia without behavioral disturbance Continue [facility] for safety and care assistance, continue Donepezil 561mqd. Update CBC CMP/eGFR  Osteoarthritis, multiple sites Stable, continue Norco 5/32514mid, Tylenol 500m87md, s/p Ortho evaluation.   Overactive bladder No urinary retention, continue Myrbetriq 50mg39mqd.      Family/ staff Communication: plan of care reviewed with the patient and charge nurse.   Labs/tests ordered:  CBC, CMP/eGFR  Time spend 25 minutes.

## 2018-10-17 NOTE — Assessment & Plan Note (Signed)
No urinary retention, continue Myrbetriq 50mg  po qd.

## 2018-10-17 NOTE — Assessment & Plan Note (Signed)
Stable, continue Norco 5/325mg  bid, Tylenol 500mg  bid, s/p Ortho evaluation.

## 2018-10-17 NOTE — Assessment & Plan Note (Signed)
Stable, continue Pantoprazole 40mg qd.  

## 2018-10-21 DIAGNOSIS — I10 Essential (primary) hypertension: Secondary | ICD-10-CM | POA: Diagnosis not present

## 2018-10-21 LAB — BASIC METABOLIC PANEL
BUN: 18 (ref 4–21)
Creatinine: 0.8 (ref 0.5–1.1)
Glucose: 100
Potassium: 4.4 (ref 3.4–5.3)
Sodium: 140 (ref 137–147)

## 2018-10-21 LAB — CBC AND DIFFERENTIAL
HCT: 43 (ref 36–46)
Hemoglobin: 14.4 (ref 12.0–16.0)
Platelets: 311 (ref 150–399)

## 2018-10-21 LAB — HEPATIC FUNCTION PANEL
ALT: 12 (ref 7–35)
AST: 13 (ref 13–35)

## 2018-11-10 ENCOUNTER — Other Ambulatory Visit: Payer: Self-pay | Admitting: *Deleted

## 2018-11-10 DIAGNOSIS — M150 Primary generalized (osteo)arthritis: Principal | ICD-10-CM

## 2018-11-10 DIAGNOSIS — M159 Polyosteoarthritis, unspecified: Secondary | ICD-10-CM

## 2018-11-10 MED ORDER — HYDROCODONE-ACETAMINOPHEN 5-325 MG PO TABS: 1.0000 | ORAL_TABLET | Freq: Two times a day (BID) | ORAL | 0 refills | Status: DC

## 2018-12-11 ENCOUNTER — Other Ambulatory Visit: Payer: Self-pay | Admitting: *Deleted

## 2018-12-11 DIAGNOSIS — M150 Primary generalized (osteo)arthritis: Principal | ICD-10-CM

## 2018-12-11 DIAGNOSIS — M159 Polyosteoarthritis, unspecified: Secondary | ICD-10-CM

## 2018-12-11 MED ORDER — HYDROCODONE-ACETAMINOPHEN 5-325 MG PO TABS: 1.0000 | ORAL_TABLET | Freq: Two times a day (BID) | ORAL | 0 refills | Status: DC

## 2018-12-11 NOTE — Telephone Encounter (Signed)
Received fax order from [facility] and sent to [facility] for Approval.

## 2019-01-12 ENCOUNTER — Other Ambulatory Visit: Payer: Self-pay | Admitting: *Deleted

## 2019-01-12 DIAGNOSIS — M159 Polyosteoarthritis, unspecified: Secondary | ICD-10-CM

## 2019-01-12 MED ORDER — HYDROCODONE-ACETAMINOPHEN 5-325 MG PO TABS: 1.0000 | ORAL_TABLET | Freq: Two times a day (BID) | ORAL | 0 refills | Status: DC

## 2019-01-22 ENCOUNTER — Non-Acute Institutional Stay: Payer: Medicare Other | Admitting: Nurse Practitioner

## 2019-01-22 ENCOUNTER — Encounter: Payer: Self-pay | Admitting: Nurse Practitioner

## 2019-01-22 DIAGNOSIS — M159 Polyosteoarthritis, unspecified: Secondary | ICD-10-CM

## 2019-01-22 DIAGNOSIS — M150 Primary generalized (osteo)arthritis: Secondary | ICD-10-CM | POA: Diagnosis not present

## 2019-01-22 DIAGNOSIS — F0390 Unspecified dementia without behavioral disturbance: Secondary | ICD-10-CM | POA: Diagnosis not present

## 2019-01-22 DIAGNOSIS — N3281 Overactive bladder: Secondary | ICD-10-CM | POA: Diagnosis not present

## 2019-01-22 DIAGNOSIS — K269 Duodenal ulcer, unspecified as acute or chronic, without hemorrhage or perforation: Secondary | ICD-10-CM | POA: Diagnosis not present

## 2019-01-22 NOTE — Progress Notes (Signed)
Location[facility] Room Number: 908/A Place of Service:  ALF (13) Provider: Lennie Odor Junice Fei NP  Shanekia Latella X, NP  Patient Care Team: Teigan Manner X, NP as PCP - General (Internal Medicine) [name] Dyonna Jaspers X, NP as Nurse Practitioner (Nurse Practitioner) [name], MD as Attending Physician (Urology) [name], MD as Consulting Physician (Gastroenterology)  Extended Emergency Contact Information Primary Emergency Contact: Theroux,[name] Address: [place] [address] [name] of [place] Phone: [phone] Relation: Son  Code Status:  DNR Goals of care: Advanced Directive information Advanced Directives 01/22/2019  Does Patient Have a Medical Advance Directive? Yes  Type of Paramedic of [place];Living will  Does patient want to make changes to medical advance directive? No - Patient declined  Copy of [place] in Chart? Yes - validated most recent copy scanned in chart (See row information)  Pre-existing out of facility DNR order (yellow form or pink MOST form) -     Chief Complaint  Patient presents with  . Medical Management of Chronic Issues    Routine visit     HPI:  Pt is a 85 y.o. female seen today for medical management of chronic diseases.    The patient resides in [place] for safety and care assistance, her memory is preserved on Donepezil 5mg  qd. Hx of PUD,  stable on Pantoprazole 40mg  bid. OAB is managed with Myrbetrq 50mg  qd. Lower back pain is stable, on Norco 5/325mg  bid, Tylenol 500mg  bid, she ambulates with walker.   Past Medical History:  Diagnosis Date  . Allergic rhinitis due to pollen 07/01/2009  . Anemia, unspecified 09/07/2010  . Arthritis   . Benign paroxysmal positional vertigo   . Candidiasis 01/01/2012  . Cramp of limb 01/11/2011  . Disorder of bone and cartilage, unspecified 11/10/2010  . Dizziness and giddiness 11/10/2010  . Insomnia,  unspecified 09/07/2010  . Knee pain, left anterior   . Lumbago 07/01/2009  . Lump or mass in breast 08/01/2012  . Memory loss 01/22/2012  . Myalgia and myositis, unspecified 05/17/2011  . Myopathy, unspecified   . Osteoarthrosis, unspecified whether generalized or localized, lower leg   . Other abnormal glucose 03/23/2010  . Other chest pain 08/28/2012  . Other disorders of bone and cartilage(733.99) 09/19/2012  . Overactive bladder   . Rash    inner legs- ? med allergy- to see PCP if doesnt improve by end of week  . Rash and nonspecific skin eruption 10/28/2014  . Right bundle branch block   . Seborrheic keratoses 05/17/2011  . Spasm of muscle 01/11/2011  . Spinal stenosis, lumbar region, without neurogenic claudication   . Stress incontinence 07/08/2014  . Unspecified constipation 12/18/2011  . Unspecified tinnitus   . Unspecified urinary incontinence   . Urgency of urination   . Urinary frequency 09/18/2012   Past Surgical History:  Procedure Laterality Date  . ABDOMINAL HYSTERECTOMY    . APPENDECTOMY  1956  . BACK SURGERY     lumbar-- ? type  Dr Tonita Cong  . BREAST LUMPECTOMY  1993   breast cyst  . BREAST SURGERY     right lumpectomy  . CLEFT LIP REPAIR     with repair palate  . ESOPHAGOGASTRODUODENOSCOPY Left 03/10/2013   Procedure: ESOPHAGOGASTRODUODENOSCOPY (EGD);  Surgeon: Beryle Beams, MD;  [facility];  Service: Endoscopy;  Laterality: Left;  . EYE SURGERY  bilateral cataract extraction with IOL  . TOTAL KNEE ARTHROPLASTY  12/13/2011   Procedure: TOTAL KNEE ARTHROPLASTY;  Surgeon: Johnn Hai, MD;  [facility];  Service: Orthopedics;  Laterality: Left;    No Known Allergies  Allergies as of 01/22/2019   No Known Allergies     Medication List       Accurate as of Jan 22, 2019 11:59 PM. If you have any questions, ask your nurse or doctor.        acetaminophen 500 MG tablet Commonly known as:  TYLENOL Take 500 mg by mouth 2 (two) times daily.    bisacodyl 5 MG EC tablet Commonly known as:  DULCOLAX Take 5 mg by mouth daily as needed.   donepezil 5 MG tablet Commonly known as:  ARICEPT Take 5 mg by mouth at bedtime.   HYDROcodone-acetaminophen 5-325 MG tablet Commonly known as:  NORCO/VICODIN Take 1 tablet by mouth 2 (two) times daily. For back pain   Lidocaine 4 % Ptch Apply topically. Apply patch to left knee on for 12 hours and off for 12 hours.   Myrbetriq 50 MG Tb24 tablet Generic drug:  mirabegron ER Take 50 mg by mouth daily.   pantoprazole 40 MG tablet Commonly known as:  Protonix Take 1 tablet (40 mg total) by mouth 2 (two) times daily.      ROS was provided with assistance of staff.  Review of Systems  Constitutional: Negative for activity change, appetite change, chills, diaphoresis, fatigue, fever and unexpected weight change.  HENT: Positive for hearing loss. Negative for congestion, mouth sores and voice change.   Eyes: Negative for visual disturbance.  Respiratory: Negative for cough, shortness of breath and wheezing.   Cardiovascular: Negative for chest pain, palpitations and leg swelling.  Gastrointestinal: Negative for abdominal distention, abdominal pain, constipation, diarrhea, nausea and vomiting.  Genitourinary: Negative for difficulty urinating, dysuria, hematuria and urgency.  Musculoskeletal: Positive for arthralgias, back pain and gait problem.  Skin: Negative for color change and pallor.  Neurological: Negative for dizziness, speech difficulty and headaches.       Dementia  Psychiatric/Behavioral: Negative for agitation, behavioral problems, hallucinations and sleep disturbance. The patient is not nervous/anxious.     Immunization History  Administered Date(s) Administered  . Influenza Whole 05/27/2012, 06/10/2013, 06/06/2018  . Influenza-Unspecified 06/14/2014, 05/26/2015, 06/13/2016, 06/17/2017  . Pneumococcal Conjugate-13 06/24/2017  . Pneumococcal Polysaccharide-23 10/03/2011  .  Td 10/03/2011  . Zoster 08/28/2007   Pertinent  Health Maintenance Due  Topic Date Due  . INFLUENZA VACCINE  03/28/2019  . DEXA SCAN  Completed  . PNA vac Low Risk Adult  Completed   Fall Risk  05/17/2017 09/15/2015 01/21/2014  Falls in the past year? No No No   Functional Status Survey:    Vitals:   01/22/19 0937  BP: 140/76  Pulse: 72  Resp: 19  Temp: 98.3 F (36.8 C)  SpO2: 92%  Weight: 174 lb 6.4 oz (79.1 kg)  Height: 5\' 2"  (1.575 m)   Body mass index is 31.9 kg/m. Physical Exam Constitutional:      General: She is not in acute distress.    Appearance: Normal appearance. She is obese. She is not ill-appearing, toxic-appearing or diaphoretic.  HENT:     Head: Normocephalic and atraumatic.     Nose: Nose normal. No congestion or rhinorrhea.     Mouth/Throat:     Mouth: Mucous membranes are moist.  Eyes:     Extraocular Movements: Extraocular movements intact.  Conjunctiva/sclera: Conjunctivae normal.     Pupils: Pupils are equal, round, and reactive to light.  Neck:     Musculoskeletal: Normal range of motion and neck supple.  Cardiovascular:     Rate and Rhythm: Normal rate and regular rhythm.     Heart sounds: No murmur.  Pulmonary:     Effort: Pulmonary effort is normal.     Breath sounds: No wheezing, rhonchi or rales.  Abdominal:     Palpations: Abdomen is soft.     Tenderness: There is no abdominal tenderness. There is no right CVA tenderness, left CVA tenderness, guarding or rebound.  Musculoskeletal:     Right lower leg: No edema.     Left lower leg: No edema.     Comments: Ambulates with walker.   Skin:    General: Skin is warm and dry.  Neurological:     General: No focal deficit present.     Mental Status: She is alert. Mental status is at baseline.     Motor: No weakness.     Coordination: Coordination normal.     Gait: Gait abnormal.     Comments: Oriented to person and her room on unit  Psychiatric:        Mood and Affect: Mood normal.         Behavior: Behavior normal.     Labs reviewed: Recent Labs    10/21/18  NA 140  K 4.4  BUN 18  CREATININE 0.8   Recent Labs    10/21/18  AST 13  ALT 12   Recent Labs    05/30/18 10/21/18  WBC 11.0  --   HGB 13.2 14.4  HCT 39 43  PLT 376 311   Lab Results  Component Value Date   TSH 1.56 12/19/2017   Lab Results  Component Value Date   HGBA1C 5.6 04/24/2016   Lab Results  Component Value Date   CHOL 189 09/06/2015   HDL 65 09/06/2015   LDLCALC 99 09/06/2015   TRIG 126 09/06/2015    Significant Diagnostic Results in last 30 days:  No results found.  Assessment/Plan  Duodenal ulcer without hemorrhage or perforation Stable, continue Pantoprazole 40mg  bid.   Dementia without behavioral disturbance Functioning well, continue [facility] for safety and care assistance, continue Donepezil 5mg  qd, ambulates with walker.   Osteoarthritis, multiple sites Pain is well controlled, continue Norco 5/325mg  bid, Tylenol 500mg  bid. Continue to ambulate with walker.   Overactive bladder Stable, continue Myrbetriq 50mg  qd, urinary leakage occasionally.    Family/ staff Communication: plan of care reviewed with the patient and charge nurse.   Labs/tests ordered:  None  Time spend 40 minutes

## 2019-01-23 ENCOUNTER — Encounter: Payer: Self-pay | Admitting: Nurse Practitioner

## 2019-01-23 NOTE — Assessment & Plan Note (Signed)
Pain is well controlled, continue Norco 5/325mg  bid, Tylenol 500mg  bid. Continue to ambulate with walker.

## 2019-01-23 NOTE — Assessment & Plan Note (Signed)
Stable, continue Myrbetriq 50mg  qd, urinary leakage occasionally.

## 2019-01-23 NOTE — Assessment & Plan Note (Signed)
Stable, continue Pantoprazole 40mg bid.  

## 2019-01-23 NOTE — Assessment & Plan Note (Signed)
Functioning well, continue [facility] for safety and care assistance, continue Donepezil 5mg  qd, ambulates with walker.

## 2019-02-11 ENCOUNTER — Other Ambulatory Visit: Payer: Self-pay

## 2019-02-11 DIAGNOSIS — M159 Polyosteoarthritis, unspecified: Secondary | ICD-10-CM

## 2019-02-11 MED ORDER — HYDROCODONE-ACETAMINOPHEN 5-325 MG PO TABS: 1.0000 | ORAL_TABLET | Freq: Two times a day (BID) | ORAL | 0 refills | Status: DC

## 2019-03-03 DIAGNOSIS — Z1159 Encounter for screening for other viral diseases: Secondary | ICD-10-CM | POA: Diagnosis not present

## 2019-03-10 ENCOUNTER — Other Ambulatory Visit: Payer: Self-pay | Admitting: Internal Medicine

## 2019-03-10 DIAGNOSIS — M159 Polyosteoarthritis, unspecified: Secondary | ICD-10-CM

## 2019-03-10 MED ORDER — HYDROCODONE-ACETAMINOPHEN 5-325 MG PO TABS: 1.0000 | ORAL_TABLET | Freq: Two times a day (BID) | ORAL | 0 refills | Status: DC

## 2019-03-16 DIAGNOSIS — B342 Coronavirus infection, unspecified: Secondary | ICD-10-CM | POA: Diagnosis not present

## 2019-03-25 DIAGNOSIS — Z03818 Encounter for observation for suspected exposure to other biological agents ruled out: Secondary | ICD-10-CM | POA: Diagnosis not present

## 2019-03-27 ENCOUNTER — Other Ambulatory Visit: Payer: Self-pay

## 2019-04-01 DIAGNOSIS — Z1159 Encounter for screening for other viral diseases: Secondary | ICD-10-CM | POA: Diagnosis not present

## 2019-04-09 ENCOUNTER — Other Ambulatory Visit: Payer: Self-pay | Admitting: *Deleted

## 2019-04-09 DIAGNOSIS — M159 Polyosteoarthritis, unspecified: Secondary | ICD-10-CM

## 2019-04-09 MED ORDER — HYDROCODONE-ACETAMINOPHEN 5-325 MG PO TABS: 1.0000 | ORAL_TABLET | Freq: Two times a day (BID) | ORAL | 0 refills | Status: DC

## 2019-04-21 ENCOUNTER — Encounter: Payer: Self-pay | Admitting: Internal Medicine

## 2019-04-21 ENCOUNTER — Non-Acute Institutional Stay: Payer: Medicare Other | Admitting: Internal Medicine

## 2019-04-21 DIAGNOSIS — M159 Polyosteoarthritis, unspecified: Secondary | ICD-10-CM

## 2019-04-21 DIAGNOSIS — M150 Primary generalized (osteo)arthritis: Secondary | ICD-10-CM | POA: Diagnosis not present

## 2019-04-21 DIAGNOSIS — N3281 Overactive bladder: Secondary | ICD-10-CM

## 2019-04-21 DIAGNOSIS — Z8719 Personal history of other diseases of the digestive system: Secondary | ICD-10-CM | POA: Diagnosis not present

## 2019-04-21 DIAGNOSIS — F0390 Unspecified dementia without behavioral disturbance: Secondary | ICD-10-CM | POA: Diagnosis not present

## 2019-04-21 NOTE — Progress Notes (Addendum)
Location:  [place] Room Number: [place]:  ALF [phone]) Provider:  Veleta Miners  MD  Mast, Man X, NP  Patient Care Team: Mast, Man X, NP as PCP - General (Internal Medicine) [name] Mast, Man X, NP as Nurse Practitioner (Nurse Practitioner) [name], MD as Attending Physician (Urology) [name], MD as Consulting Physician (Gastroenterology)  Extended Emergency Contact Information Primary Emergency Contact: Eggert,[name] Address: [place] [address] [name] [place] Phone: [phone] Relation: Son  Code Status:  DNR Goals of care: Advanced Directive information Advanced Directives 04/21/2019  Does Patient Have a Medical Advance Directive? Yes  Type of Paramedic of [place];Living will;Out of facility DNR (pink MOST or yellow form)  Does patient want to make changes to medical advance directive? No - Patient declined  Copy of [place] in Chart? Yes - validated most recent copy scanned in chart (See row information)  Pre-existing out of facility DNR order (yellow form or pink MOST form) Yellow form placed in chart (order not valid for inpatient use)     Chief Complaint  Patient presents with  . Medical Management of Chronic Issues    HPI:  Janet Hernandez is a 85 y.o. female seen today for medical management of chronic diseases.   Patient lives in [place] in [place].  She has h/o Lumbago, Stress incontinence, Vertigo, h/o Duodenal Ulcer and dementia She walks with the walker. She has lost few pounds over past few months. No New nursing issues. Independent in her ADLS.   Past Medical History:  Diagnosis Date  . Allergic rhinitis due to pollen 07/01/2009  . Anemia, unspecified 09/07/2010  . Arthritis   . Benign paroxysmal positional vertigo   . Candidiasis 01/01/2012  . Cramp of limb 01/11/2011  . Disorder of bone and cartilage,  unspecified 11/10/2010  . Dizziness and giddiness 11/10/2010  . Insomnia, unspecified 09/07/2010  . Knee pain, left anterior   . Lumbago 07/01/2009  . Lump or mass in breast 08/01/2012  . Memory loss 01/22/2012  . Myalgia and myositis, unspecified 05/17/2011  . Myopathy, unspecified   . Osteoarthrosis, unspecified whether generalized or localized, lower leg   . Other abnormal glucose 03/23/2010  . Other chest pain 08/28/2012  . Other disorders of bone and cartilage(733.99) 09/19/2012  . Overactive bladder   . Rash    inner legs- ? med allergy- to see PCP if doesnt improve by end of week  . Rash and nonspecific skin eruption 10/28/2014  . Right bundle branch block   . Seborrheic keratoses 05/17/2011  . Spasm of muscle 01/11/2011  . Spinal stenosis, lumbar region, without neurogenic claudication   . Stress incontinence 07/08/2014  . Unspecified constipation 12/18/2011  . Unspecified tinnitus   . Unspecified urinary incontinence   . Urgency of urination   . Urinary frequency 09/18/2012   Past Surgical History:  Procedure Laterality Date  . ABDOMINAL HYSTERECTOMY    . APPENDECTOMY  1956  . BACK SURGERY     lumbar-- ? type  Dr Tonita Cong  . BREAST LUMPECTOMY  1993   breast cyst  . BREAST SURGERY     right lumpectomy  . CLEFT LIP REPAIR     with repair palate  . ESOPHAGOGASTRODUODENOSCOPY Left 03/10/2013   Procedure: ESOPHAGOGASTRODUODENOSCOPY (EGD);  Surgeon: Beryle Beams, MD;  Location: [facility];  Service: Endoscopy;  Laterality: Left;  .  EYE SURGERY     bilateral cataract extraction with IOL  . TOTAL KNEE ARTHROPLASTY  12/13/2011   Procedure: TOTAL KNEE ARTHROPLASTY;  Surgeon: Johnn Hai, MD;  Location: [facility];  Service: Orthopedics;  Laterality: Left;    No Known Allergies  Outpatient Encounter Medications as of 04/21/2019  Medication Sig  . acetaminophen (TYLENOL) 500 MG tablet Take 500 mg by mouth 2 (two) times daily.  . bisacodyl (DULCOLAX) 5 MG EC tablet Take 5 mg by mouth  daily as needed.   . donepezil (ARICEPT) 5 MG tablet Take 5 mg by mouth at bedtime.  Marland Kitchen HYDROcodone-acetaminophen (NORCO/VICODIN) 5-325 MG tablet Take 1 tablet by mouth 2 (two) times daily. For back pain  . Lidocaine 4 % PTCH Apply topically. Apply patch to left knee on for 12 hours and off for 12 hours.  Marland Kitchen MYRBETRIQ 50 MG TB24 tablet Take 50 mg by mouth daily.   . pantoprazole (PROTONIX) 40 MG tablet Take 1 tablet (40 mg total) by mouth 2 (two) times daily.   No facility-administered encounter medications on file as of 04/21/2019.     Review of Systems  Review of Systems  Constitutional: Negative for activity change, appetite change, chills, diaphoresis, fatigue and fever.  HENT: Negative for mouth sores, postnasal drip, rhinorrhea, sinus pain and sore throat.   Respiratory: Negative for apnea, cough, chest tightness, shortness of breath and wheezing.   Cardiovascular: Negative for chest pain, palpitations and leg swelling.  Gastrointestinal: Negative for abdominal distention, abdominal pain, constipation, diarrhea, nausea and vomiting.  Genitourinary: Negative for dysuria and frequency.  Musculoskeletal: Negative for arthralgias, joint swelling and myalgias.  Skin: Negative for rash.  Neurological: Negative for dizziness, syncope, weakness, light-headedness and numbness.  Psychiatric/Behavioral: Negative for behavioral problems, confusion and sleep disturbance.     Immunization History  Administered Date(s) Administered  . Influenza Whole 05/27/2012, 06/10/2013, 06/06/2018  . Influenza-Unspecified 06/14/2014, 05/26/2015, 06/13/2016, 06/17/2017  . Pneumococcal Conjugate-13 06/24/2017  . Pneumococcal Polysaccharide-23 10/03/2011  . Td 10/03/2011  . Zoster 08/28/2007   Pertinent  Health Maintenance Due  Topic Date Due  . INFLUENZA VACCINE  03/28/2019  . DEXA SCAN  Completed  . PNA vac Low Risk Adult  Completed   Fall Risk  05/17/2017 09/15/2015 01/21/2014  Falls in the past year?  No No No   Functional Status Survey:    Vitals:   04/21/19 0916  BP: 130/80  Pulse: 89  Resp: 18  Temp: 97.9 F (36.6 C)  SpO2: 90%  Weight: 169 lb 12.8 oz (77 kg)  Height: 5\' 2"  (1.575 m)   Body mass index is 31.06 kg/m. Physical Exam Constitutional: . Well-developed and well-nourished.  HENT:  Head: Normocephalic.  Mouth/Throat: Oropharynx is clear and moist.  Eyes: Pupils are equal, round, and reactive to light.  Neck: Neck supple.  Cardiovascular: Normal rate and normal heart sounds.  No murmur heard. Pulmonary/Chest: Effort normal and breath sounds normal. No respiratory distress. No wheezes. She has no rales.  Abdominal: Soft. Bowel sounds are normal. No distension. There is no tenderness. There is no rebound.  Musculoskeletal: No edema.  Lymphadenopathy: none Neurological: No Focal assist  Skin: Skin is warm and dry.  Psychiatric: Normal mood and affect. Behavior is normal. Thought content normal.   Labs reviewed: Recent Labs    10/21/18  NA 140  K 4.4  BUN 18  CREATININE 0.8   Recent Labs    10/21/18  AST 13  ALT 12   Recent Labs  05/30/18 10/21/18  WBC 11.0  --   HGB 13.2 14.4  HCT 39 43  PLT 376 311   Lab Results  Component Value Date   TSH 1.56 12/19/2017   Lab Results  Component Value Date   HGBA1C 5.6 04/24/2016   Lab Results  Component Value Date   CHOL 189 09/06/2015   HDL 65 09/06/2015   LDLCALC 99 09/06/2015   TRIG 126 09/06/2015    Significant Diagnostic Results in last 30 days:  No results found.  Assessment/Plan   Overactive bladder Continue on Myrbetiq  History of duodenal ulcer No Active Bleeding On Protonix  Dementia  Doing well in [place] Continue the support On Aricept  Primary osteoarthritis involving multiple joints Continue Norco PRN       Family/ staff Communication:   Labs/tests ordered:  CMP,CBC  Total time spent in this patient care encounter was  25_  minutes; greater than 50% of the  visit spent counseling patient and staff, reviewing records , Labs and coordinating care for problems addressed at this encounter.

## 2019-05-11 ENCOUNTER — Other Ambulatory Visit: Payer: Self-pay | Admitting: *Deleted

## 2019-05-11 DIAGNOSIS — M159 Polyosteoarthritis, unspecified: Secondary | ICD-10-CM

## 2019-05-11 MED ORDER — HYDROCODONE-ACETAMINOPHEN 5-325 MG PO TABS: 1.0000 | ORAL_TABLET | Freq: Two times a day (BID) | ORAL | 0 refills | Status: DC

## 2019-05-11 NOTE — Telephone Encounter (Signed)
Received Fax from [facility] for refill Request.  Pended and sent to [facility] for approval.

## 2019-06-04 ENCOUNTER — Other Ambulatory Visit: Payer: Self-pay | Admitting: *Deleted

## 2019-06-04 DIAGNOSIS — M159 Polyosteoarthritis, unspecified: Secondary | ICD-10-CM

## 2019-06-04 MED ORDER — HYDROCODONE-ACETAMINOPHEN 5-325 MG PO TABS: 1.0000 | ORAL_TABLET | Freq: Two times a day (BID) | ORAL | 0 refills | Status: DC

## 2019-06-04 NOTE — Telephone Encounter (Signed)
Received fax refill request from [facility] Pended Rx and sent to Dr. Gupta for approval.  

## 2019-06-30 DIAGNOSIS — Z20828 Contact with and (suspected) exposure to other viral communicable diseases: Secondary | ICD-10-CM | POA: Diagnosis not present

## 2019-07-07 DIAGNOSIS — Z03818 Encounter for observation for suspected exposure to other biological agents ruled out: Secondary | ICD-10-CM | POA: Diagnosis not present

## 2019-07-09 ENCOUNTER — Other Ambulatory Visit: Payer: Self-pay

## 2019-07-09 DIAGNOSIS — M159 Polyosteoarthritis, unspecified: Secondary | ICD-10-CM

## 2019-07-09 MED ORDER — HYDROCODONE-ACETAMINOPHEN 5-325 MG PO TABS: 1.0000 | ORAL_TABLET | Freq: Two times a day (BID) | ORAL | 0 refills | Status: DC

## 2019-07-14 DIAGNOSIS — Z03818 Encounter for observation for suspected exposure to other biological agents ruled out: Secondary | ICD-10-CM | POA: Diagnosis not present

## 2019-07-16 ENCOUNTER — Encounter: Payer: Self-pay | Admitting: Nurse Practitioner

## 2019-07-16 ENCOUNTER — Non-Acute Institutional Stay: Payer: Medicare Other | Admitting: Nurse Practitioner

## 2019-07-16 DIAGNOSIS — F0390 Unspecified dementia without behavioral disturbance: Secondary | ICD-10-CM

## 2019-07-16 DIAGNOSIS — K269 Duodenal ulcer, unspecified as acute or chronic, without hemorrhage or perforation: Secondary | ICD-10-CM | POA: Diagnosis not present

## 2019-07-16 DIAGNOSIS — M8949 Other hypertrophic osteoarthropathy, multiple sites: Secondary | ICD-10-CM

## 2019-07-16 DIAGNOSIS — M159 Polyosteoarthritis, unspecified: Secondary | ICD-10-CM

## 2019-07-16 DIAGNOSIS — N3281 Overactive bladder: Secondary | ICD-10-CM | POA: Diagnosis not present

## 2019-07-16 NOTE — Assessment & Plan Note (Signed)
Continue [place] [facility] for safety, care assistance, continue Donepezil 5mg  qd.

## 2019-07-16 NOTE — Assessment & Plan Note (Addendum)
Hx of GI bleed, stable, continue Pantoprazole 21m bid. Update CBC/diff, CMP/eGFR

## 2019-07-16 NOTE — Progress Notes (Signed)
Location:   [facility] Room Number: 212 Place of Service:  ALF (13) Provider:  ,  X, NP  ,  X, NP  Patient Care Team: ,  X, NP as PCP - General (Internal Medicine) [name] ,  X, NP as Nurse Practitioner (Nurse Practitioner) [name], MD as Attending Physician (Urology) [name], MD as Consulting Physician (Gastroenterology)  Extended Emergency Contact Information Primary Emergency Contact: Rennert,[name] Address: [place] [address] [place] Phone: [phone] Relation: Son  Code Status:  DNR Goals of care: Advanced Directive information Advanced Directives 04/21/2019  Does Patient Have a Medical Advance Directive? Yes  Type of Paramedic of [place];Living will;Out of facility DNR (pink MOST or yellow form)  Does patient want to make changes to medical advance directive? No - Patient declined  Copy of [place] in Chart? Yes - validated most recent copy scanned in chart (See row information)  Pre-existing out of facility DNR order (yellow form or pink MOST form) Yellow form placed in chart (order not valid for inpatient use)     Chief Complaint  Patient presents with  . Medical agement of Chronic Issues    HPI:  Pt is a 85 y.o. female seen today for medical management of chronic diseases.    The patient resides in [place] [facility] for safety, care assistance, on Donepezil 5gmg qd for memory.  Hx of PUD/GI bleed, on protonix 73m bid. . OAB no urinary retention, on Myrbetriq 510mqd. Generalized OA, stable, on Norco 5/32586mid, Tylenol 500m39md.    Past Medical History:  Diagnosis Date  . Allergic rhinitis due to pollen 07/01/2009  . Anemia, unspecified 09/07/2010  . Arthritis   . Benign paroxysmal positional vertigo   . Candidiasis 01/01/2012  . Cramp of limb 01/11/2011  . Disorder of bone and cartilage, unspecified  11/10/2010  . Dizziness and giddiness 11/10/2010  . Insomnia, unspecified 09/07/2010  . Knee pain, left anterior   . Lumbago 07/01/2009  . Lump or mass in breast 08/01/2012  . Memory loss 01/22/2012  . Myalgia and myositis, unspecified 05/17/2011  . Myopathy, unspecified   . Osteoarthrosis, unspecified whether generalized or localized, lower leg   . Other abnormal glucose 03/23/2010  . Other chest pain 08/28/2012  . Other disorders of bone and cartilage(733.99) 09/19/2012  . Overactive bladder   . Rash    inner legs- ? med allergy- to see PCP if doesnt improve by end of week  . Rash and nonspecific skin eruption 10/28/2014  . Right bundle branch block   . Seborrheic keratoses 05/17/2011  . Spasm of muscle 01/11/2011  . Spinal stenosis, lumbar region, without neurogenic claudication   . Stress incontinence 07/08/2014  . Unspecified constipation 12/18/2011  . Unspecified tinnitus   . Unspecified urinary incontinence   . Urgency of urination   . Urinary frequency 09/18/2012   Past Surgical History:  Procedure Laterality Date  . ABDOMINAL HYSTERECTOMY    . APPENDECTOMY  1956  . BACK SURGERY     lumbar-- ? type  Dr BeanTonita CongBREAST LUMPECTOMY  1993   breast cyst  . BREAST SURGERY     right lumpectomy  . CLEFT LIP REPAIR     with repair palate  . ESOPHAGOGASTRODUODENOSCOPY Left 03/10/2013   Procedure: ESOPHAGOGASTRODUODENOSCOPY (EGD);  Surgeon: PatrBeryle Beams;  [facility];  Service: Endoscopy;  Laterality: Left;  . EYE SURGERY     bilateral cataract extraction with IOL  . TOTAL KNEE ARTHROPLASTY  12/13/2011   Procedure: TOTAL KNEE ARTHROPLASTY;  Surgeon: Johnn Hai, MD;  Location: [facility];  Service: Orthopedics;  Laterality: Left;    No Known Allergies  Allergies as of 07/16/2019   No Known Allergies     Medication List       Accurate as of July 16, 2019  4:37 PM. If you have any questions, ask your nurse or doctor.        acetaminophen 500 MG tablet  Commonly known as: TYLENOL Take 500 mg by mouth 2 (two) times daily.   bisacodyl 5 MG EC tablet Commonly known as: DULCOLAX Take 5 mg by mouth daily as needed.   donepezil 5 MG tablet Commonly known as: ARICEPT Take 5 mg by mouth at bedtime.   HYDROcodone-acetaminophen 5-325 MG tablet Commonly known as: NORCO/VICODIN Take 1 tablet by mouth 2 (two) times daily. For back pain   Lidocaine 4 % Ptch Apply topically. Apply patch to left knee on for 12 hours and off for 12 hours.   Myrbetriq 50 MG Tb24 tablet Generic drug: mirabegron ER Take 50 mg by mouth daily.   pantoprazole 40 MG tablet Commonly known as: Protonix Take 1 tablet (40 mg total) by mouth 2 (two) times daily.      ROS was provided with assistance of staff.  Review of Systems  Constitutional: Negative for activity change, appetite change, chills, diaphoresis, fatigue, fever and unexpected weight change.  HENT: Positive for hearing loss. Negative for congestion and voice change.   Respiratory: Negative for cough, shortness of breath and wheezing.   Cardiovascular: Negative for chest pain and leg swelling.  Gastrointestinal: Negative for abdominal distention, abdominal pain, constipation, diarrhea, nausea and vomiting.  Genitourinary: Negative for difficulty urinating, dysuria and urgency.  Musculoskeletal: Positive for arthralgias, back pain and gait problem.  Neurological: Negative for dizziness, speech difficulty, weakness and headaches.       Dementia  Psychiatric/Behavioral: Negative for agitation, behavioral problems, hallucinations and sleep disturbance. The patient is not nervous/anxious.     Immunization History  Administered Date(s) Administered  . Influenza Whole 05/27/2012, 06/10/2013, 06/06/2018  . Influenza-Unspecified 06/14/2014, 05/26/2015, 06/13/2016, 06/17/2017  . Pneumococcal Conjugate-13 06/24/2017  . Pneumococcal Polysaccharide-23 10/03/2011  . Td 10/03/2011  . Zoster 08/28/2007    Pertinent  Health Maintenance Due  Topic Date Due  . INFLUENZA VACCINE  03/28/2019  . DEXA SCAN  Completed  . PNA vac Low Risk Adult  Completed   Fall Risk  03/27/2019 05/17/2017 09/15/2015 01/21/2014  Falls in the past year? (No Data) No No No  Comment Emmi Telephone Survey: data to providers prior to load - - -  Number falls in past yr: (No Data) - - -  Comment Emmi Telephone Survey Actual Response =  - - -   Functional Status Survey:    Vitals:   07/16/19 1531  BP: 138/84  Pulse: 80  Resp: 20  Temp: 97.9 F (36.6 C)  SpO2: 98%  Weight: 174 lb 12.8 oz (79.3 kg)  Height: _0  (1.575 m)   Body mass index is 31.97 kg/m. Physical Exam Vitals signs and nursing note reviewed.  Constitutional:      General: She is not in acute distress.    Appearance: Normal appearance. She is obese. She is not ill-appearing, toxic-appearing or diaphoretic.  HENT:     Head: Normocephalic and atraumatic.  Nose: Nose normal.     Mouth/Throat:     Mouth: Mucous membranes are moist.  Eyes:     Extraocular Movements: Extraocular movements intact.     Conjunctiva/sclera: Conjunctivae normal.     Pupils: Pupils are equal, round, and reactive to light.  Neck:     Musculoskeletal: Normal range of motion and neck supple.  Cardiovascular:     Rate and Rhythm: Normal rate and regular rhythm.     Heart sounds: No murmur.     Comments: DP did not feel the right , left is weak Pulmonary:     Breath sounds: No wheezing, rhonchi or rales.  Chest:     Chest wall: No tenderness.  Abdominal:     General: Bowel sounds are normal. There is no distension.     Palpations: Abdomen is soft.     Tenderness: There is no abdominal tenderness. There is no right CVA tenderness, left CVA tenderness, guarding or rebound.  Musculoskeletal:     Right lower leg: No edema.     Left lower leg: Edema present.  Skin:    General: Skin is warm and dry.  Neurological:     General: No focal deficit present.     Mental  Status: She is alert. Mental status is at baseline.     Coordination: Coordination normal.     Gait: Gait abnormal.     Comments: Oriented to self, her room  Psychiatric:        Mood and Affect: Mood normal.        Behavior: Behavior normal.     Labs reviewed: Recent Labs    10/21/18  NA 140  K 4.4  BUN 18  CREATININE 0.8   Recent Labs    10/21/18  AST 13  ALT 12   Recent Labs    10/21/18  HGB 14.4  HCT 43  PLT 311   Lab Results  Component Value Date   TSH 1.56 12/19/2017   Lab Results  Component Value Date   HGBA1C 5.6 04/24/2016   Lab Results  Component Value Date   CHOL 189 09/06/2015   HDL 65 09/06/2015   LDLCALC 99 09/06/2015   TRIG 126 09/06/2015    Significant Diagnostic Results in last 30 days:  No results found.  Assessment/Plan Duodenal ulcer without hemorrhage or perforation Hx of GI bleed, stable, continue Pantoprazole 42m bid. Update CBC/diff, CMP/eGFR  Dementia without behavioral disturbance Continue [place] [facility] for safety, care assistance, continue Donepezil 561mqd.   Osteoarthritis, multiple sites Pain is in multiple sites, stable, continue Norco 5/32565mid, Tylenol 500m16md.   Overactive bladder No urinary retention, continue Myrbetriq 50mg18m      Family/ staff Communication: plan of care reviewed with the patient and charge nurse.   Labs/tests ordered:  CBC/diff, CMP/eGFR  Time spend 40 minutes.

## 2019-07-16 NOTE — Assessment & Plan Note (Signed)
No urinary retention, continue Myrbetriq 50mg qd.  

## 2019-07-16 NOTE — Assessment & Plan Note (Signed)
Pain is in multiple sites, stable, continue Norco 5/325mg  bid, Tylenol 500mg  bid.

## 2019-07-21 DIAGNOSIS — I10 Essential (primary) hypertension: Secondary | ICD-10-CM | POA: Diagnosis not present

## 2019-07-21 LAB — HEPATIC FUNCTION PANEL
ALT: 14 (ref 7–35)
AST: 16 (ref 13–35)
Alkaline Phosphatase: 119 (ref 25–125)
Bilirubin, Total: 0.4

## 2019-07-21 LAB — BASIC METABOLIC PANEL
BUN: 18 (ref 4–21)
CO2: 26 — AB (ref 13–22)
Chloride: 106 (ref 99–108)
Creatinine: 0.9 (ref 0.5–1.1)
Glucose: 102
Potassium: 4.7 (ref 3.4–5.3)
Sodium: 140 (ref 137–147)

## 2019-07-21 LAB — CBC AND DIFFERENTIAL
HCT: 45 (ref 36–46)
Hemoglobin: 14.9 (ref 12.0–16.0)
Neutrophils Absolute: 4866
Platelets: 297 (ref 150–399)
WBC: 7.9

## 2019-07-21 LAB — COMPREHENSIVE METABOLIC PANEL
Albumin: 4 (ref 3.5–5.0)
Calcium: 9.4 (ref 8.7–10.7)
Globulin: 2.7

## 2019-07-21 LAB — CBC: RBC: 5.22 — AB (ref 3.87–5.11)

## 2019-08-10 ENCOUNTER — Other Ambulatory Visit: Payer: Self-pay | Admitting: *Deleted

## 2019-08-10 DIAGNOSIS — M159 Polyosteoarthritis, unspecified: Secondary | ICD-10-CM

## 2019-08-10 MED ORDER — HYDROCODONE-ACETAMINOPHEN 5-325 MG PO TABS: 1.0000 | ORAL_TABLET | Freq: Two times a day (BID) | ORAL | 0 refills | Status: DC

## 2019-08-10 NOTE — Telephone Encounter (Signed)
Received Refill Request from [place] and sent to [facility] for approval.

## 2019-08-11 DIAGNOSIS — Z20828 Contact with and (suspected) exposure to other viral communicable diseases: Secondary | ICD-10-CM | POA: Diagnosis not present

## 2019-08-18 DIAGNOSIS — Z20828 Contact with and (suspected) exposure to other viral communicable diseases: Secondary | ICD-10-CM | POA: Diagnosis not present

## 2019-09-07 ENCOUNTER — Other Ambulatory Visit: Payer: Self-pay | Admitting: *Deleted

## 2019-09-07 DIAGNOSIS — M159 Polyosteoarthritis, unspecified: Secondary | ICD-10-CM

## 2019-09-07 MED ORDER — HYDROCODONE-ACETAMINOPHEN 5-325 MG PO TABS: 1.0000 | ORAL_TABLET | Freq: Two times a day (BID) | ORAL | 0 refills | Status: DC

## 2019-09-08 ENCOUNTER — Non-Acute Institutional Stay: Payer: Medicare Other | Admitting: Internal Medicine

## 2019-09-08 ENCOUNTER — Encounter: Payer: Self-pay | Admitting: Internal Medicine

## 2019-09-08 DIAGNOSIS — N3281 Overactive bladder: Secondary | ICD-10-CM

## 2019-09-08 DIAGNOSIS — F0390 Unspecified dementia without behavioral disturbance: Secondary | ICD-10-CM

## 2019-09-08 DIAGNOSIS — K219 Gastro-esophageal reflux disease without esophagitis: Secondary | ICD-10-CM

## 2019-09-08 DIAGNOSIS — M8949 Other hypertrophic osteoarthropathy, multiple sites: Secondary | ICD-10-CM | POA: Diagnosis not present

## 2019-09-08 DIAGNOSIS — M159 Polyosteoarthritis, unspecified: Secondary | ICD-10-CM

## 2019-09-08 NOTE — Progress Notes (Signed)
Location:   [facility] Room Number: [facility]:  ALF [phone]) Provider:  Veleta Miners MD  Mast, Man X, NP  Patient Care Team: Mast, Man X, NP as PCP - General (Internal Medicine) [name] Mast, Man X, NP as Nurse Practitioner (Nurse Practitioner) [name], MD as Attending Physician (Urology) [name], MD as Consulting Physician (Gastroenterology)  Extended Emergency Contact Information Primary Emergency Contact: Sieler,[name] Address: [place] [address] [name] of [place] Phone: [phone] Relation: Son  Code Status:  DNR Goals of care: Advanced Directive information Advanced Directives 04/21/2019  Does Patient Have a Medical Advance Directive? Yes  Type of Paramedic of [place];Living will;Out of facility DNR (pink MOST or yellow form)  Does patient want to make changes to medical advance directive? No - Patient declined  Copy of [place] in Chart? Yes - validated most recent copy scanned in chart (See row information)  Pre-existing out of facility DNR order (yellow form or pink MOST form) Yellow form placed in chart (order not valid for inpatient use)     Chief Complaint  Patient presents with  . Acute Visit    Hypertension and Worsening Dementia    HPI:  Pt is a 86 y.o. female seen today for an acute visit for Episode of High BP and More Confusion  Patient lives in [place] in [place].  She has h/o Lumbago, Stress incontinence, Vertigo, h/o Duodenal Ulcer and dementia  Per nurses they have noticed that patient has been more confused recently and unable to do her ADLs.  Yesterday they had a documented systolic blood pressure of 180.  I do not see that in [facility] i but since then her blood pressure has been in normal level. Patient did not have any acute problems her only complaint was some pain in her right knee which is chronic  with slight worsening recently. Her appetite is good.  She has not had any falls recently.  Walks with a walker.  Has not had any behavior changes   Past Medical History:  Diagnosis Date  . Allergic rhinitis due to pollen 07/01/2009  . Anemia, unspecified 09/07/2010  . Arthritis   . Benign paroxysmal positional vertigo   . Candidiasis 01/01/2012  . Cramp of limb 01/11/2011  . Disorder of bone and cartilage, unspecified 11/10/2010  . Dizziness and giddiness 11/10/2010  . Insomnia, unspecified 09/07/2010  . Knee pain, left anterior   . Lumbago 07/01/2009  . Lump or mass in breast 08/01/2012  . Memory loss 01/22/2012  . Myalgia and myositis, unspecified 05/17/2011  . Myopathy, unspecified   . Osteoarthrosis, unspecified whether generalized or localized, lower leg   . Other abnormal glucose 03/23/2010  . Other chest pain 08/28/2012  . Other disorders of bone and cartilage(733.99) 09/19/2012  . Overactive bladder   . Rash    inner legs- ? med allergy- to see PCP if doesnt improve by end of week  . Rash and nonspecific skin eruption 10/28/2014  . Right bundle branch block   . Seborrheic keratoses 05/17/2011  . Spasm of muscle 01/11/2011  . Spinal stenosis, lumbar region, without neurogenic claudication   . Stress incontinence 07/08/2014  . Unspecified constipation 12/18/2011  . Unspecified tinnitus   . Unspecified urinary incontinence   . Urgency of urination   . Urinary frequency 09/18/2012   Past Surgical History:  Procedure Laterality  Date  . ABDOMINAL HYSTERECTOMY    . APPENDECTOMY  1956  . BACK SURGERY     lumbar-- ? type  Dr Tonita Cong  . BREAST LUMPECTOMY  1993   breast cyst  . BREAST SURGERY     right lumpectomy  . CLEFT LIP REPAIR     with repair palate  . ESOPHAGOGASTRODUODENOSCOPY Left 03/10/2013   Procedure: ESOPHAGOGASTRODUODENOSCOPY (EGD);  Surgeon: Beryle Beams, MD;  Location: [facility];  Service: Endoscopy;  Laterality: Left;  . EYE SURGERY     bilateral cataract extraction  with IOL  . TOTAL KNEE ARTHROPLASTY  12/13/2011   Procedure: TOTAL KNEE ARTHROPLASTY;  Surgeon: Johnn Hai, MD;  Location: [facility];  Service: Orthopedics;  Laterality: Left;    No Known Allergies  Allergies as of 09/08/2019   No Known Allergies     Medication List       Accurate as of September 08, 2019  3:11 PM. If you have any questions, ask your nurse or doctor.        acetaminophen 500 MG tablet Commonly known as: TYLENOL Take 500 mg by mouth 2 (two) times daily.   acetaminophen 325 MG tablet Commonly known as: TYLENOL Take 650 mg by mouth every 4 (four) hours as needed.   bisacodyl 5 MG EC tablet Commonly known as: DULCOLAX Take 5 mg by mouth daily as needed.   donepezil 5 MG tablet Commonly known as: ARICEPT Take 5 mg by mouth at bedtime.   HYDROcodone-acetaminophen 5-325 MG tablet Commonly known as: NORCO/VICODIN Take 1 tablet by mouth 2 (two) times daily. For back pain   Lidocaine 4 % Ptch Apply topically. Apply patch to left knee on for 12 hours and off for 12 hours.   Myrbetriq 50 MG Tb24 tablet Generic drug: mirabegron ER Take 50 mg by mouth daily.   pantoprazole 40 MG tablet Commonly known as: Protonix Take 1 tablet (40 mg total) by mouth 2 (two) times daily.       Review of Systems  Review of Systems  Constitutional: Negative for activity change, appetite change, chills, diaphoresis, fatigue and fever.  HENT: Negative for mouth sores, postnasal drip, rhinorrhea, sinus pain and sore throat.   Respiratory: Negative for apnea, cough, chest tightness, shortness of breath and wheezing.   Cardiovascular: Negative for chest pain, palpitations and leg swelling.  Gastrointestinal: Negative for abdominal distention, abdominal pain, constipation, diarrhea, nausea and vomiting.  Genitourinary: Negative for dysuria and frequency.  Musculoskeletal: Positive for Knee pain Skin: Negative for rash.  Neurological: Negative for dizziness, syncope, weakness,  light-headedness and numbness.  Psychiatric/Behavioral: Negative for behavioral problems,Positive for Confusion    Immunization History  Administered Date(s) Administered  . Influenza Whole 05/27/2012, 06/10/2013, 06/06/2018  . Influenza, High Dose Seasonal PF 05/30/2019  . Influenza-Unspecified 06/14/2014, 05/26/2015, 06/13/2016, 06/17/2017  . Moderna SARS-COVID-2 Vaccination 08/29/2019  . Pneumococcal Conjugate-13 06/24/2017  . Pneumococcal Polysaccharide-23 10/03/2011  . Td 10/03/2011  . Zoster 08/28/2007   Pertinent  Health Maintenance Due  Topic Date Due  . INFLUENZA VACCINE  Completed  . DEXA SCAN  Completed  . PNA vac Low Risk Adult  Completed   Fall Risk  03/27/2019 05/17/2017 09/15/2015 01/21/2014  Falls in the past year? (No Data) No No No  Comment [facility] Telephone Survey: data to providers prior to load - - -  Number falls in past yr: (No Data) - - -  Comment [facility] Telephone Survey Actual Response =  - - -   Functional Status  Survey:    Vitals:   09/08/19 1501  BP: (!) 142/80  Pulse: 90  Resp: 18  Temp: 98.2 F (36.8 C)  SpO2: 97%  Weight: 177 lb 3.2 oz (80.4 kg)  Height: 5\' 2"  (1.575 m)   Body mass index is 32.41 kg/m. Physical Exam Constitutional:  Well-developed and well-nourished.  HENT:  Head: Normocephalic.  Mouth/Throat: Oropharynx is clear and moist.  Eyes: Pupils are equal, round, and reactive to light.  Neck: Neck supple.  Cardiovascular: Normal rate and normal heart sounds.  No murmur heard. Pulmonary/Chest: Effort normal and breath sounds normal. No respiratory distress. No wheezes. She has no rales.  Abdominal: Soft. Bowel sounds are normal. No distension. There is no tenderness. There is no rebound.  Musculoskeletal: No edema. Has arthritis in her Right Knee. No swelling or redness  Is able to walk without walker Lymphadenopathy: none Neurological: No Focal Deficits Skin: Skin is warm and dry.  Psychiatric: Normal mood and affect. Behavior  is normal. Thought content normal.   Labs reviewed: Recent Labs    10/21/18 0000 07/21/19 0000  NA 140 140  K 4.4 4.7  CL  --  106  CO2  --  26*  BUN 18 18  CREATININE 0.8 0.9  CALCIUM  --  9.4   Recent Labs    10/21/18 0000 07/21/19 0000  AST 13 16  ALT 12 14  ALKPHOS  --  119  ALBUMIN  --  4.0   Recent Labs    10/21/18 0000 07/21/19 0000  WBC  --  7.9  NEUTROABS  --  4,866  HGB 14.4 14.9  HCT 43 45  PLT 311 297   Lab Results  Component Value Date   TSH 1.56 12/19/2017   Lab Results  Component Value Date   HGBA1C 5.6 04/24/2016   Lab Results  Component Value Date   CHOL 189 09/06/2015   HDL 65 09/06/2015   LDLCALC 99 09/06/2015   TRIG 126 09/06/2015    Significant Diagnostic Results in last 30 days:  No results found.  Assessment/Plan Primary osteoarthritis with now Right Knee pain Continue Tylenol PRN Will get Xray of her Knee Hypertension Continue to monitor her BP QD for 2 weeks Today BP in Normal Range  Dementia  Check MMSE Start on Namenda 5 mg BID Already on Aricept  Overactive bladder Continue Mybetriq  Gastroesophageal reflux disease,  Continue Protonix     Family/ staff Communication:   Labs/tests ordered:  CMP,CBC, TSH

## 2019-09-10 DIAGNOSIS — I10 Essential (primary) hypertension: Secondary | ICD-10-CM | POA: Diagnosis not present

## 2019-09-10 LAB — BASIC METABOLIC PANEL
BUN: 20 (ref 4–21)
CO2: 25 — AB (ref 13–22)
Chloride: 108 (ref 99–108)
Creatinine: 0.8 (ref 0.5–1.1)
Glucose: 113
Potassium: 4.3 (ref 3.4–5.3)
Sodium: 142 (ref 137–147)

## 2019-09-10 LAB — HEPATIC FUNCTION PANEL
ALT: 23 (ref 7–35)
AST: 16 (ref 13–35)
Alkaline Phosphatase: 115 (ref 25–125)
Bilirubin, Total: 0.4

## 2019-09-10 LAB — COMPREHENSIVE METABOLIC PANEL
Albumin: 3.7 (ref 3.5–5.0)
Calcium: 9.4 (ref 8.7–10.7)
Globulin: 2.6

## 2019-09-10 LAB — CBC AND DIFFERENTIAL
HCT: 43 (ref 36–46)
Hemoglobin: 14.2 (ref 12.0–16.0)
Neutrophils Absolute: 4256
Platelets: 295 (ref 150–399)
WBC: 7

## 2019-09-10 LAB — CBC: RBC: 5.06 (ref 3.87–5.11)

## 2019-09-10 LAB — TSH: TSH: 1.59 (ref 0.41–5.90)

## 2019-09-16 DIAGNOSIS — Z20828 Contact with and (suspected) exposure to other viral communicable diseases: Secondary | ICD-10-CM | POA: Diagnosis not present

## 2019-09-23 DIAGNOSIS — Z20828 Contact with and (suspected) exposure to other viral communicable diseases: Secondary | ICD-10-CM | POA: Diagnosis not present

## 2019-09-30 DIAGNOSIS — Z20828 Contact with and (suspected) exposure to other viral communicable diseases: Secondary | ICD-10-CM | POA: Diagnosis not present

## 2019-10-07 ENCOUNTER — Other Ambulatory Visit: Payer: Self-pay | Admitting: *Deleted

## 2019-10-07 DIAGNOSIS — M159 Polyosteoarthritis, unspecified: Secondary | ICD-10-CM

## 2019-10-07 DIAGNOSIS — Z20828 Contact with and (suspected) exposure to other viral communicable diseases: Secondary | ICD-10-CM | POA: Diagnosis not present

## 2019-10-07 MED ORDER — HYDROCODONE-ACETAMINOPHEN 5-325 MG PO TABS: 1.0000 | ORAL_TABLET | Freq: Two times a day (BID) | ORAL | 0 refills | Status: DC

## 2019-10-07 NOTE — Telephone Encounter (Signed)
Received fax from [facility] for refill Pended Rx and sent to [name] for approval 

## 2019-10-08 ENCOUNTER — Encounter: Payer: Self-pay | Admitting: Nurse Practitioner

## 2019-10-08 ENCOUNTER — Encounter: Payer: Self-pay | Admitting: Internal Medicine

## 2019-10-08 NOTE — Progress Notes (Signed)
Opened in error

## 2019-10-08 NOTE — Progress Notes (Signed)
Location:   [facility] Room Number: [id number] Place of Service:  ALF [phone]) Provider:  Veleta Miners, MD  Mast, Man X, NP  Patient Care Team: Mast, Man X, NP as PCP - General (Internal Medicine) [name] Mast, Man X, NP as Nurse Practitioner (Nurse Practitioner) [name], MD as Attending Physician (Urology) [name], MD as Consulting Physician (Gastroenterology)  Extended Emergency Contact Information Primary Emergency Contact: Vancleve,[name] Address: [place] [address] [name] [place] Phone: [phone] Relation: Son  Code Status:  DNR Goals of care: Advanced Directive information Advanced Directives 10/08/2019  Does Patient Have a Medical Advance Directive? Yes  Type of Advance Directive Living will;Out of facility DNR (pink MOST or yellow form)  Does patient want to make changes to medical advance directive? No - Patient declined  Copy of [place] in Chart? Yes - validated most recent copy scanned in chart (See row information)  Pre-existing out of facility DNR order (yellow form or pink MOST form) -     Chief Complaint  Patient presents with  . Medical Management of Chronic Issues    Routine Visit     HPI:  Pt is a 86 y.o. female seen today for medical management of chronic diseases.     Past Medical History:  Diagnosis Date  . Allergic rhinitis due to pollen 07/01/2009  . Anemia, unspecified 09/07/2010  . Arthritis   . Benign paroxysmal positional vertigo   . Candidiasis 01/01/2012  . Cramp of limb 01/11/2011  . Disorder of bone and cartilage, unspecified 11/10/2010  . Dizziness and giddiness 11/10/2010  . Insomnia, unspecified 09/07/2010  . Knee pain, left anterior   . Lumbago 07/01/2009  . Lump or mass in breast 08/01/2012  . Memory loss 01/22/2012  . Myalgia and myositis, unspecified 05/17/2011  . Myopathy, unspecified   . Osteoarthrosis, unspecified whether  generalized or localized, lower leg   . Other abnormal glucose 03/23/2010  . Other chest pain 08/28/2012  . Other disorders of bone and cartilage(733.99) 09/19/2012  . Overactive bladder   . Rash    inner legs- ? med allergy- to see PCP if doesnt improve by end of week  . Rash and nonspecific skin eruption 10/28/2014  . Right bundle branch block   . Seborrheic keratoses 05/17/2011  . Spasm of muscle 01/11/2011  . Spinal stenosis, lumbar region, without neurogenic claudication   . Stress incontinence 07/08/2014  . Unspecified constipation 12/18/2011  . Unspecified tinnitus   . Unspecified urinary incontinence   . Urgency of urination   . Urinary frequency 09/18/2012   Past Surgical History:  Procedure Laterality Date  . ABDOMINAL HYSTERECTOMY    . APPENDECTOMY  1956  . BACK SURGERY     lumbar-- ? type  Dr Tonita Cong  . BREAST LUMPECTOMY  1993   breast cyst  . BREAST SURGERY     right lumpectomy  . CLEFT LIP REPAIR     with repair palate  . ESOPHAGOGASTRODUODENOSCOPY Left 03/10/2013   Procedure: ESOPHAGOGASTRODUODENOSCOPY (EGD);  Surgeon: Beryle Beams, MD;  Location: [facility];  Service: Endoscopy;  Laterality: Left;  . EYE SURGERY     bilateral cataract extraction with IOL  . TOTAL KNEE ARTHROPLASTY  12/13/2011   Procedure: TOTAL KNEE ARTHROPLASTY;  Surgeon: Johnn Hai, MD;  Location: [facility];  Service: Orthopedics;  Laterality: Left;    No Known Allergies  Allergies  as of 10/08/2019   No Known Allergies     Medication List       Accurate as of October 08, 2019  4:37 PM. If you have any questions, ask your nurse or doctor.        acetaminophen 500 MG tablet Commonly known as: TYLENOL Take 500 mg by mouth 2 (two) times daily.   acetaminophen 325 MG tablet Commonly known as: TYLENOL Take 650 mg by mouth every 4 (four) hours as needed.   bisacodyl 5 MG EC tablet Commonly known as: DULCOLAX Take 5 mg by mouth daily as needed.   donepezil 5 MG tablet Commonly known  as: ARICEPT Take 5 mg by mouth at bedtime.   HYDROcodone-acetaminophen 5-325 MG tablet Commonly known as: NORCO/VICODIN Take 1 tablet by mouth 2 (two) times daily. For back pain   Lidocaine 4 % Ptch Apply topically. Apply patch to left knee on for 12 hours and off for 12 hours.   Myrbetriq 50 MG Tb24 tablet Generic drug: mirabegron ER Take 50 mg by mouth daily.   pantoprazole 40 MG tablet Commonly known as: Protonix Take 1 tablet (40 mg total) by mouth 2 (two) times daily.       Review of Systems  Immunization History  Administered Date(s) Administered  . Influenza Whole 05/27/2012, 06/10/2013, 06/06/2018  . Influenza, High Dose Seasonal PF 05/30/2019  . Influenza-Unspecified 06/14/2014, 05/26/2015, 06/13/2016, 06/17/2017  . Moderna SARS-COVID-2 Vaccination 08/29/2019, 09/26/2019  . Pneumococcal Conjugate-13 06/24/2017  . Pneumococcal Polysaccharide-23 10/03/2011  . Td 10/03/2011  . Zoster 08/28/2007   Pertinent  Health Maintenance Due  Topic Date Due  . INFLUENZA VACCINE  Completed  . DEXA SCAN  Completed  . PNA vac Low Risk Adult  Completed   Fall Risk  03/27/2019 05/17/2017 09/15/2015 01/21/2014  Falls in the past year? (No Data) No No No  Comment [facility] Telephone Survey: data to providers prior to load - - -  Number falls in past yr: (No Data) - - -  Comment [facility] Telephone Survey Actual Response =  - - -   Functional Status Survey:    Vitals:   10/08/19 1632  BP: (!) 142/84  Pulse: 76  Resp: 18  Temp: 97.9 F (36.6 C)  SpO2: 93%  Weight: 175 lb (79.4 kg)  Height: 5\' 2"  (1.575 m)   Body mass index is 32.01 kg/m. Physical Exam  Labs reviewed: Recent Labs    10/21/18 0000 07/21/19 0000  NA 140 140  K 4.4 4.7  CL  --  106  CO2  --  26*  BUN 18 18  CREATININE 0.8 0.9  CALCIUM  --  9.4   Recent Labs    10/21/18 0000 07/21/19 0000  AST 13 16  ALT 12 14  ALKPHOS  --  119  ALBUMIN  --  4.0   Recent Labs    10/21/18 0000 07/21/19 0000  WBC   --  7.9  NEUTROABS  --  4,866  HGB 14.4 14.9  HCT 43 45  PLT 311 297   Lab Results  Component Value Date   TSH 1.56 12/19/2017   Lab Results  Component Value Date   HGBA1C 5.6 04/24/2016   Lab Results  Component Value Date   CHOL 189 09/06/2015   HDL 65 09/06/2015   LDLCALC 99 09/06/2015   TRIG 126 09/06/2015    Significant Diagnostic Results in last 30 days:  No results found.  Assessment/Plan There are no diagnoses linked to this  encounter.   Family/ staff Communication:   Labs/tests ordered:     This encounter was created in error - please disregard.

## 2019-10-09 NOTE — Progress Notes (Signed)
This encounter was created in error - please disregard.

## 2019-10-13 ENCOUNTER — Non-Acute Institutional Stay (SKILLED_NURSING_FACILITY): Payer: Medicare Other | Admitting: Internal Medicine

## 2019-10-13 ENCOUNTER — Encounter: Payer: Self-pay | Admitting: Internal Medicine

## 2019-10-13 DIAGNOSIS — F0390 Unspecified dementia without behavioral disturbance: Secondary | ICD-10-CM | POA: Diagnosis not present

## 2019-10-13 DIAGNOSIS — R2681 Unsteadiness on feet: Secondary | ICD-10-CM | POA: Diagnosis not present

## 2019-10-13 DIAGNOSIS — M8949 Other hypertrophic osteoarthropathy, multiple sites: Secondary | ICD-10-CM | POA: Diagnosis not present

## 2019-10-13 DIAGNOSIS — M150 Primary generalized (osteo)arthritis: Secondary | ICD-10-CM | POA: Diagnosis not present

## 2019-10-13 DIAGNOSIS — M6281 Muscle weakness (generalized): Secondary | ICD-10-CM | POA: Diagnosis not present

## 2019-10-13 DIAGNOSIS — K219 Gastro-esophageal reflux disease without esophagitis: Secondary | ICD-10-CM | POA: Diagnosis not present

## 2019-10-13 DIAGNOSIS — M159 Polyosteoarthritis, unspecified: Secondary | ICD-10-CM

## 2019-10-13 DIAGNOSIS — N3946 Mixed incontinence: Secondary | ICD-10-CM | POA: Diagnosis not present

## 2019-10-13 DIAGNOSIS — R41841 Cognitive communication deficit: Secondary | ICD-10-CM | POA: Diagnosis not present

## 2019-10-13 DIAGNOSIS — I10 Essential (primary) hypertension: Secondary | ICD-10-CM | POA: Diagnosis not present

## 2019-10-13 NOTE — Progress Notes (Signed)
Provider:  Veleta Miners MD  Location:   [facility] Room Number: 23 Place of Service:  SNF (31)  PCP: Mast, Man X, NP Patient Care Team: Mast, Man X, NP as PCP - General (Internal Medicine) [name] Mast, Man X, NP as Nurse Practitioner (Nurse Practitioner) [name], MD as Attending Physician (Urology) [name], MD as Consulting Physician (Gastroenterology)  Extended Emergency Contact Information Primary Emergency Contact: Hellickson,[name] Address: [place] [address] [name] of [place] Phone: [phone] Relation: Son  Code Status: DNR Goals of Care: Advanced Directive information Advanced Directives 10/13/2019  Does Patient Have a Medical Advance Directive? Yes  Type of Advance Directive Out of facility DNR (pink MOST or yellow form);Living will  Does patient want to make changes to medical advance directive? No - Patient declined  Copy of [facility] in Chart? -  Pre-existing out of facility DNR order (yellow form or pink MOST form) Yellow form placed in chart (order not valid for inpatient use)      Chief Complaint  Patient presents with  . New Admit To SNF    Admission    HPI: Patient is a 86 y.o. female seen today for admission to SNF for Long term care  Patient was moved from [facility] as she was needing more help with her ADLS due to her worsening dementia She has h/o Lumbago, Stress incontinence, Vertigo, h/o Duodenal Ulcer and dementia and Arthritis  Has adjusted well to her new room. Did not have any acute complains except Pain in her knees. No New Nursing issues Walks with the walker. No Falls. Stays confused.   Past Medical History:  Diagnosis Date  . Allergic rhinitis due to pollen 07/01/2009  . Anemia, unspecified 09/07/2010  . Arthritis   . Benign paroxysmal positional vertigo   . Candidiasis 01/01/2012  . Cramp of limb 01/11/2011  . Disorder of bone and  cartilage, unspecified 11/10/2010  . Dizziness and giddiness 11/10/2010  . Insomnia, unspecified 09/07/2010  . Knee pain, left anterior   . Lumbago 07/01/2009  . Lump or mass in breast 08/01/2012  . Memory loss 01/22/2012  . Myalgia and myositis, unspecified 05/17/2011  . Myopathy, unspecified   . Osteoarthrosis, unspecified whether generalized or localized, lower leg   . Other abnormal glucose 03/23/2010  . Other chest pain 08/28/2012  . Other disorders of bone and cartilage(733.99) 09/19/2012  . Overactive bladder   . Rash    inner legs- ? med allergy- to see PCP if doesnt improve by end of week  . Rash and nonspecific skin eruption 10/28/2014  . Right bundle branch block   . Seborrheic keratoses 05/17/2011  . Spasm of muscle 01/11/2011  . Spinal stenosis, lumbar region, without neurogenic claudication   . Stress incontinence 07/08/2014  . Unspecified constipation 12/18/2011  . Unspecified tinnitus   . Unspecified urinary incontinence   . Urgency of urination   . Urinary frequency 09/18/2012   Past Surgical History:  Procedure Laterality Date  . ABDOMINAL HYSTERECTOMY    . APPENDECTOMY  1956  . BACK SURGERY     lumbar-- ? type  Dr Tonita Cong  . BREAST LUMPECTOMY  1993   breast cyst  . BREAST SURGERY     right lumpectomy  . CLEFT LIP REPAIR     with repair palate  . ESOPHAGOGASTRODUODENOSCOPY Left 03/10/2013   Procedure: ESOPHAGOGASTRODUODENOSCOPY (EGD);  Surgeon: Beryle Beams,  MD;  Location: [facility];  Service: Endoscopy;  Laterality: Left;  . EYE SURGERY     bilateral cataract extraction with IOL  . TOTAL KNEE ARTHROPLASTY  12/13/2011   Procedure: TOTAL KNEE ARTHROPLASTY;  Surgeon: Johnn Hai, MD;  Location: [facility];  Service: Orthopedics;  Laterality: Left;    reports that she has never smoked. She has never used smokeless tobacco. She reports that she does not drink alcohol or use drugs. Social History   Socioeconomic History  . Marital status: Divorced    Spouse name: Not  on file  . Number of children: Not on file  . Years of education: Not on file  . Highest education level: Not on file  Occupational History  . Occupation: retired Fish farm manager  Tobacco Use  . Smoking status: Never Smoker  . Smokeless tobacco: Never Used  Substance and Sexual Activity  . Alcohol use: No    Comment: socially   wine  . Drug use: No  . Sexual activity: Never    Birth control/protection: Post-menopausal  Other Topics Concern  . Not on file  Social History Narrative   Lives at [facility] since 08/2007   Divorce    Never smoked   Alcohol -rare   Exercise none    Living Will   Social Determinants of Health   Financial Resource Strain:   . Difficulty of Paying Living Expenses: Not on file  Food Insecurity:   . Worried About Charity fundraiser in the Last Year: Not on file  . Ran Out of Food in the Last Year: Not on file  Transportation Needs:   . Lack of Transportation (Medical): Not on file  . Lack of Transportation (Non-Medical): Not on file  Physical Activity:   . Days of Exercise per Week: Not on file  . Minutes of Exercise per Session: Not on file  Stress:   . Feeling of Stress : Not on file  Social Connections:   . Frequency of Communication with Friends and Family: Not on file  . Frequency of Social Gatherings with Friends and Family: Not on file  . Attends Religious Services: Not on file  . Active Member of Clubs or Organizations: Not on file  . Attends Archivist Meetings: Not on file  . Marital Status: Not on file  Intimate Partner Violence:   . Fear of Current or Ex-Partner: Not on file  . Emotionally Abused: Not on file  . Physically Abused: Not on file  . Sexually Abused: Not on file    Functional Status Survey:    History reviewed. No pertinent family history.  Health Maintenance  Topic Date Due  . TETANUS/TDAP  10/02/2021  . INFLUENZA VACCINE  Completed  . DEXA SCAN  Completed  . PNA vac Low Risk Adult   Completed    No Known Allergies  Allergies as of 10/13/2019   No Known Allergies     Medication List       Accurate as of October 13, 2019 10:30 AM. If you have any questions, ask your nurse or doctor.        acetaminophen 500 MG tablet Commonly known as: TYLENOL Take 500 mg by mouth 2 (two) times daily. What changed: Another medication with the same name was removed. Continue taking this medication, and follow the directions you see here. Changed by: Virgie Dad, MD   bisacodyl 5 MG EC tablet Commonly known as: DULCOLAX Take 5 mg by mouth daily  as needed.   donepezil 5 MG tablet Commonly known as: ARICEPT Take 5 mg by mouth at bedtime.   HYDROcodone-acetaminophen 5-325 MG tablet Commonly known as: NORCO/VICODIN Take 1 tablet by mouth 2 (two) times daily. For back pain   Lidocaine 4 % Ptch Apply topically 2 (two) times daily. Apply patch to left knee on for 12 hours and off for 12 hours.   memantine 5 MG tablet Commonly known as: NAMENDA Take 5 mg by mouth 2 (two) times daily.   Myrbetriq 50 MG Tb24 tablet Generic drug: mirabegron ER Take 50 mg by mouth daily.   pantoprazole 40 MG tablet Commonly known as: Protonix Take 1 tablet (40 mg total) by mouth 2 (two) times daily.   Tubersol 5 UNIT/0.1ML injection Generic drug: tuberculin Inject into the skin once. Start taking on: October 23, 2019       Review of Systems  Review of Systems  Constitutional: Negative for activity change, appetite change, chills, diaphoresis, fatigue and fever.  HENT: Negative for mouth sores, postnasal drip, rhinorrhea, sinus pain and sore throat.   Respiratory: Negative for apnea, cough, chest tightness, shortness of breath and wheezing.   Cardiovascular: Negative for chest pain, palpitations and leg swelling.  Gastrointestinal: Negative for abdominal distention, abdominal pain, constipation, diarrhea, nausea and vomiting.  Genitourinary: Negative for dysuria and frequency.    Musculoskeletal: Negative for arthralgias, joint swelling and myalgias.  Skin: Negative for rash.  Neurological: Negative for dizziness, syncope, weakness, light-headedness and numbness.  Psychiatric/Behavioral: Negative for behavioral problems, confusion and sleep disturbance.     Vitals:   10/13/19 1025  BP: 138/76  Pulse: 81  Resp: 18  Temp: (!) 97.4 F (36.3 C)  SpO2: 92%  Weight: 175 lb (79.4 kg)  Height: 5\' 2"  (1.575 m)   Body mass index is 32.01 kg/m. Physical Exam  Constitutional: . Well-developed and well-nourished.  HENT:  Head: Normocephalic.  Mouth/Throat: Oropharynx is clear and moist.  Eyes: Pupils are equal, round, and reactive to light.  Neck: Neck supple.  Cardiovascular: Normal rate and normal heart sounds.  No murmur heard. Pulmonary/Chest: Effort normal and breath sounds normal. No respiratory distress. No wheezes. She has no rales.  Abdominal: Soft. Bowel sounds are normal. No distension. There is no tenderness. There is no rebound.  Musculoskeletal: No edema.  Lymphadenopathy: none Neurological: Not Oriented. No Focal deficits Skin: Skin is warm and dry.  Psychiatric: Normal mood and affect. Behavior is normal. Thought content normal.   Labs reviewed: Basic Metabolic Panel: Recent Labs    10/21/18 0000 07/21/19 0000 09/10/19 0000  NA 140 140 142  K 4.4 4.7 4.3  CL  --  106 108  CO2  --  26* 25*  BUN 18 18 20   CREATININE 0.8 0.9 0.8  CALCIUM  --  9.4 9.4   Liver Function Tests: Recent Labs    10/21/18 0000 07/21/19 0000 09/10/19 0000  AST 13 16 16   ALT 12 14 23   ALKPHOS  --  119 115  ALBUMIN  --  4.0 3.7   No results for input(s): LIPASE, AMYLASE in the last 8760 hours. No results for input(s): AMMONIA in the last 8760 hours. CBC: Recent Labs    10/21/18 0000 07/21/19 0000 09/10/19 0000  WBC  --  7.9 7.0  NEUTROABS  --  4,866 4,256  HGB 14.4 14.9 14.2  HCT 43 45 43  PLT 311 297 295   Cardiac Enzymes: No results for  input(s): CKTOTAL, CKMB, CKMBINDEX, TROPONINI  in the last 8760 hours. BNP: Invalid input(s): POCBNP Lab Results  Component Value Date   HGBA1C 5.6 04/24/2016   Lab Results  Component Value Date   TSH 1.59 09/10/2019   Lab Results  Component Value Date   VITAMINB12 365 12/19/2017   No results found for: FOLATE No results found for: IRON, TIBC, FERRITIN  Imaging and Procedures obtained prior to SNF admission: NM Renal Imaging Flow W/Pharm  Result Date: 04/06/2013 *RADIOLOGY REPORT* Clinical Data:  Right-sided hydronephrosis on CT. NUCLEAR MEDICINE RENAL SCAN WITH DIURETIC ADMINISTRATION Technique:  Radionuclide angiographic and sequential renal images were obtained after intravenous injection of radiopharmaceutical. Imaging was continued during slow intravenous injection of Lasix approximately 15 minutes after the start of the examination. Radiopharmaceutical:  16 mCi Tc-71m MAG3 Comparison:  Renal ultrasound 09/23/2012.  Abdominal pelvic CT 03/10/2013. Findings: Perfusion images demonstrate early symmetric perfusion to the kidneys.  Differential renal uptake at 2-3 minutes is 50.5% on the right and 49.5% on the left. Renogram images demonstrate normal renal cortical uptake and excretion bilaterally.  There is mild to moderate dilatation of the right renal pelvis with retained contrast prior to Lasix administration.  However, this contrast washes out following Lasix. There is no significant retained contrast within the collecting system post voiding. The right renogram curve is downsloping following Lasix. Differential renal function is 50.2% right and 49.8% left.  Post Lasix T 1/2 times are 3.5 minutes on the right and 6.0 minutes on the left. IMPRESSION: 1.  Dilatation of the right renal pelvis with good washout of activity following Lasix.  No significant ureteral obstruction demonstrated. 2.  Differential renal function is nearly symmetric. Original Report Authenticated By: Richardean Sale,  M.D.    Assessment/Plan  Essential hypertension Not on any meds Monitor her BP QD  Primary osteoarthritis involving multiple joints Right Knee Xray showed Moderate Arthritis Continue Tylenol and Norco for Pain  Dementia without behavioral disturbance, On Aricept Increase Namenda 10 mg BID MMSE was 20/30 in Facility  GERD On Protonix Urinary Incontinence On Myrbetriq   Family/ staff Communication:   Labs/tests ordered

## 2019-10-14 DIAGNOSIS — Z20828 Contact with and (suspected) exposure to other viral communicable diseases: Secondary | ICD-10-CM | POA: Diagnosis not present

## 2019-10-16 DIAGNOSIS — R41841 Cognitive communication deficit: Secondary | ICD-10-CM | POA: Diagnosis not present

## 2019-10-16 DIAGNOSIS — N3946 Mixed incontinence: Secondary | ICD-10-CM | POA: Diagnosis not present

## 2019-10-16 DIAGNOSIS — R2681 Unsteadiness on feet: Secondary | ICD-10-CM | POA: Diagnosis not present

## 2019-10-16 DIAGNOSIS — M150 Primary generalized (osteo)arthritis: Secondary | ICD-10-CM | POA: Diagnosis not present

## 2019-10-16 DIAGNOSIS — M6281 Muscle weakness (generalized): Secondary | ICD-10-CM | POA: Diagnosis not present

## 2019-10-19 DIAGNOSIS — R2681 Unsteadiness on feet: Secondary | ICD-10-CM | POA: Diagnosis not present

## 2019-10-19 DIAGNOSIS — M6281 Muscle weakness (generalized): Secondary | ICD-10-CM | POA: Diagnosis not present

## 2019-10-19 DIAGNOSIS — N3946 Mixed incontinence: Secondary | ICD-10-CM | POA: Diagnosis not present

## 2019-10-19 DIAGNOSIS — M150 Primary generalized (osteo)arthritis: Secondary | ICD-10-CM | POA: Diagnosis not present

## 2019-10-19 DIAGNOSIS — R41841 Cognitive communication deficit: Secondary | ICD-10-CM | POA: Diagnosis not present

## 2019-10-20 DIAGNOSIS — R41841 Cognitive communication deficit: Secondary | ICD-10-CM | POA: Diagnosis not present

## 2019-10-20 DIAGNOSIS — M6281 Muscle weakness (generalized): Secondary | ICD-10-CM | POA: Diagnosis not present

## 2019-10-20 DIAGNOSIS — M150 Primary generalized (osteo)arthritis: Secondary | ICD-10-CM | POA: Diagnosis not present

## 2019-10-20 DIAGNOSIS — N3946 Mixed incontinence: Secondary | ICD-10-CM | POA: Diagnosis not present

## 2019-10-20 DIAGNOSIS — R2681 Unsteadiness on feet: Secondary | ICD-10-CM | POA: Diagnosis not present

## 2019-10-21 DIAGNOSIS — N3946 Mixed incontinence: Secondary | ICD-10-CM | POA: Diagnosis not present

## 2019-10-21 DIAGNOSIS — R41841 Cognitive communication deficit: Secondary | ICD-10-CM | POA: Diagnosis not present

## 2019-10-21 DIAGNOSIS — M6281 Muscle weakness (generalized): Secondary | ICD-10-CM | POA: Diagnosis not present

## 2019-10-21 DIAGNOSIS — R2681 Unsteadiness on feet: Secondary | ICD-10-CM | POA: Diagnosis not present

## 2019-10-21 DIAGNOSIS — M150 Primary generalized (osteo)arthritis: Secondary | ICD-10-CM | POA: Diagnosis not present

## 2019-10-22 DIAGNOSIS — N3946 Mixed incontinence: Secondary | ICD-10-CM | POA: Diagnosis not present

## 2019-10-22 DIAGNOSIS — R2681 Unsteadiness on feet: Secondary | ICD-10-CM | POA: Diagnosis not present

## 2019-10-22 DIAGNOSIS — R41841 Cognitive communication deficit: Secondary | ICD-10-CM | POA: Diagnosis not present

## 2019-10-22 DIAGNOSIS — M150 Primary generalized (osteo)arthritis: Secondary | ICD-10-CM | POA: Diagnosis not present

## 2019-10-22 DIAGNOSIS — M6281 Muscle weakness (generalized): Secondary | ICD-10-CM | POA: Diagnosis not present

## 2019-10-26 DIAGNOSIS — M6281 Muscle weakness (generalized): Secondary | ICD-10-CM | POA: Diagnosis not present

## 2019-10-26 DIAGNOSIS — R41841 Cognitive communication deficit: Secondary | ICD-10-CM | POA: Diagnosis not present

## 2019-10-26 DIAGNOSIS — N3946 Mixed incontinence: Secondary | ICD-10-CM | POA: Diagnosis not present

## 2019-10-26 DIAGNOSIS — R2681 Unsteadiness on feet: Secondary | ICD-10-CM | POA: Diagnosis not present

## 2019-10-26 DIAGNOSIS — M150 Primary generalized (osteo)arthritis: Secondary | ICD-10-CM | POA: Diagnosis not present

## 2019-10-27 DIAGNOSIS — R41841 Cognitive communication deficit: Secondary | ICD-10-CM | POA: Diagnosis not present

## 2019-10-27 DIAGNOSIS — N3946 Mixed incontinence: Secondary | ICD-10-CM | POA: Diagnosis not present

## 2019-10-27 DIAGNOSIS — M150 Primary generalized (osteo)arthritis: Secondary | ICD-10-CM | POA: Diagnosis not present

## 2019-10-27 DIAGNOSIS — M6281 Muscle weakness (generalized): Secondary | ICD-10-CM | POA: Diagnosis not present

## 2019-10-27 DIAGNOSIS — R2681 Unsteadiness on feet: Secondary | ICD-10-CM | POA: Diagnosis not present

## 2019-10-29 DIAGNOSIS — M6281 Muscle weakness (generalized): Secondary | ICD-10-CM | POA: Diagnosis not present

## 2019-10-29 DIAGNOSIS — R2681 Unsteadiness on feet: Secondary | ICD-10-CM | POA: Diagnosis not present

## 2019-10-29 DIAGNOSIS — R41841 Cognitive communication deficit: Secondary | ICD-10-CM | POA: Diagnosis not present

## 2019-10-29 DIAGNOSIS — N3946 Mixed incontinence: Secondary | ICD-10-CM | POA: Diagnosis not present

## 2019-10-29 DIAGNOSIS — M150 Primary generalized (osteo)arthritis: Secondary | ICD-10-CM | POA: Diagnosis not present

## 2019-10-30 DIAGNOSIS — N3946 Mixed incontinence: Secondary | ICD-10-CM | POA: Diagnosis not present

## 2019-10-30 DIAGNOSIS — R2681 Unsteadiness on feet: Secondary | ICD-10-CM | POA: Diagnosis not present

## 2019-10-30 DIAGNOSIS — M150 Primary generalized (osteo)arthritis: Secondary | ICD-10-CM | POA: Diagnosis not present

## 2019-10-30 DIAGNOSIS — R41841 Cognitive communication deficit: Secondary | ICD-10-CM | POA: Diagnosis not present

## 2019-10-30 DIAGNOSIS — M6281 Muscle weakness (generalized): Secondary | ICD-10-CM | POA: Diagnosis not present

## 2019-11-01 DIAGNOSIS — M6281 Muscle weakness (generalized): Secondary | ICD-10-CM | POA: Diagnosis not present

## 2019-11-01 DIAGNOSIS — R41841 Cognitive communication deficit: Secondary | ICD-10-CM | POA: Diagnosis not present

## 2019-11-01 DIAGNOSIS — M150 Primary generalized (osteo)arthritis: Secondary | ICD-10-CM | POA: Diagnosis not present

## 2019-11-01 DIAGNOSIS — N3946 Mixed incontinence: Secondary | ICD-10-CM | POA: Diagnosis not present

## 2019-11-01 DIAGNOSIS — R2681 Unsteadiness on feet: Secondary | ICD-10-CM | POA: Diagnosis not present

## 2019-11-02 ENCOUNTER — Non-Acute Institutional Stay (SKILLED_NURSING_FACILITY): Payer: Medicare Other | Admitting: Nurse Practitioner

## 2019-11-02 ENCOUNTER — Encounter: Payer: Self-pay | Admitting: Nurse Practitioner

## 2019-11-02 DIAGNOSIS — W19XXXA Unspecified fall, initial encounter: Secondary | ICD-10-CM | POA: Diagnosis not present

## 2019-11-02 DIAGNOSIS — F0390 Unspecified dementia without behavioral disturbance: Secondary | ICD-10-CM | POA: Diagnosis not present

## 2019-11-02 DIAGNOSIS — M6281 Muscle weakness (generalized): Secondary | ICD-10-CM | POA: Diagnosis not present

## 2019-11-02 DIAGNOSIS — M150 Primary generalized (osteo)arthritis: Secondary | ICD-10-CM | POA: Diagnosis not present

## 2019-11-02 DIAGNOSIS — R2681 Unsteadiness on feet: Secondary | ICD-10-CM | POA: Diagnosis not present

## 2019-11-02 DIAGNOSIS — N3946 Mixed incontinence: Secondary | ICD-10-CM | POA: Diagnosis not present

## 2019-11-02 DIAGNOSIS — N3281 Overactive bladder: Secondary | ICD-10-CM

## 2019-11-02 DIAGNOSIS — K257 Chronic gastric ulcer without hemorrhage or perforation: Secondary | ICD-10-CM

## 2019-11-02 DIAGNOSIS — R41841 Cognitive communication deficit: Secondary | ICD-10-CM | POA: Diagnosis not present

## 2019-11-02 DIAGNOSIS — M8949 Other hypertrophic osteoarthropathy, multiple sites: Secondary | ICD-10-CM

## 2019-11-02 DIAGNOSIS — M159 Polyosteoarthritis, unspecified: Secondary | ICD-10-CM

## 2019-11-02 NOTE — Assessment & Plan Note (Signed)
Stable, continue Pantoprazole 40mg bid.  

## 2019-11-02 NOTE — Assessment & Plan Note (Signed)
Stable, continue Myrbetriq °

## 2019-11-02 NOTE — Progress Notes (Signed)
Location:   [facility] Room Number: 67/A Place of Service:  SNF (31) Provider: Marlana Latus NP  Justise Ehmann X, NP  Patient Care Team: Rohnan Bartleson X, NP as PCP - General (Internal Medicine) [name] Rowan Pollman X, NP as Nurse Practitioner (Nurse Practitioner) [name], MD as Attending Physician (Urology) [name], MD as Consulting Physician (Gastroenterology)  Extended Emergency Contact Information Primary Emergency Contact: Kitson,[name] Address: [place] [address] [name] of [place] Phone: [phone] Relation: Son  Code Status:  DNR Goals of care: Advanced Directive information Advanced Directives 11/02/2019  Does Patient Have a Medical Advance Directive? Yes  Type of Advance Directive Out of facility DNR (pink MOST or yellow form)  Does patient want to make changes to medical advance directive? No - Patient declined  Copy of [place] in Chart? -  Pre-existing out of facility DNR order (yellow form or pink MOST form) Yellow form placed in chart (order not valid for inpatient use)     Chief Complaint  Patient presents with  . Acute Visit    Fall    HPI:  Pt is a 86 y.o. female seen today for an acute visit for  reported fall 10/31/19 when the patient was found sitting on the floor by the head of the bed, the patient stated she just sat on the floor and could not get up. Unsteady gait/balance and poor safety awareness are contributory.   The patient resides in SNF [facility] for safety, care assistance, ambulate with walker, on Donepezil, Memantine for memory. OA stable, on Tylenol 500mg  bid, Norco 5/325mg  bid. PUD, stable, on Pantoprazole 40mg  bid. Urinary frequency, stable, on Myrbetriq.     Past Medical History:  Diagnosis Date  . Allergic rhinitis due to pollen 07/01/2009  . Anemia, unspecified 09/07/2010  . Arthritis   . Benign paroxysmal positional vertigo   .  Candidiasis 01/01/2012  . Cramp of limb 01/11/2011  . Disorder of bone and cartilage, unspecified 11/10/2010  . Dizziness and giddiness 11/10/2010  . Insomnia, unspecified 09/07/2010  . Knee pain, left anterior   . Lumbago 07/01/2009  . Lump or mass in breast 08/01/2012  . Memory loss 01/22/2012  . Myalgia and myositis, unspecified 05/17/2011  . Myopathy, unspecified   . Osteoarthrosis, unspecified whether generalized or localized, lower leg   . Other abnormal glucose 03/23/2010  . Other chest pain 08/28/2012  . Other disorders of bone and cartilage(733.99) 09/19/2012  . Overactive bladder   . Rash    inner legs- ? med allergy- to see PCP if doesnt improve by end of week  . Rash and nonspecific skin eruption 10/28/2014  . Right bundle branch block   . Seborrheic keratoses 05/17/2011  . Spasm of muscle 01/11/2011  . Spinal stenosis, lumbar region, without neurogenic claudication   . Stress incontinence 07/08/2014  . Unspecified constipation 12/18/2011  . Unspecified tinnitus   . Unspecified urinary incontinence   . Urgency of urination   . Urinary frequency 09/18/2012   Past Surgical History:  Procedure Laterality Date  . ABDOMINAL HYSTERECTOMY    . APPENDECTOMY  1956  . BACK SURGERY     lumbar-- ? type  Dr Tonita Cong  . BREAST LUMPECTOMY  1993   breast cyst  . BREAST SURGERY     right lumpectomy  . CLEFT LIP REPAIR     with repair palate  . ESOPHAGOGASTRODUODENOSCOPY  Left 03/10/2013   Procedure: ESOPHAGOGASTRODUODENOSCOPY (EGD);  Surgeon: Beryle Beams, MD;  Location: [facility];  Service: Endoscopy;  Laterality: Left;  . EYE SURGERY     bilateral cataract extraction with IOL  . TOTAL KNEE ARTHROPLASTY  12/13/2011   Procedure: TOTAL KNEE ARTHROPLASTY;  Surgeon: Johnn Hai, MD;  Location: [facility];  Service: Orthopedics;  Laterality: Left;    No Known Allergies  Allergies as of 11/02/2019   No Known Allergies     Medication List       Accurate as of November 02, 2019 11:59 PM. If you  have any questions, ask your nurse or doctor.        acetaminophen 500 MG tablet Commonly known as: TYLENOL Take 500 mg by mouth 2 (two) times daily.   bisacodyl 5 MG EC tablet Commonly known as: DULCOLAX Take 5 mg by mouth daily as needed.   donepezil 5 MG tablet Commonly known as: ARICEPT Take 5 mg by mouth at bedtime.   HYDROcodone-acetaminophen 5-325 MG tablet Commonly known as: NORCO/VICODIN Take 1 tablet by mouth 2 (two) times daily. For back pain   Lidocaine 4 % Ptch Apply 4 % topically daily. Remove Lidocaine Patch every evening from left knee. 12 hours on 12 hours off   memantine 5 MG tablet Commonly known as: NAMENDA Take 10 mg by mouth 2 (two) times daily.   Myrbetriq 50 MG Tb24 tablet Generic drug: mirabegron ER Take 50 mg by mouth daily.   pantoprazole 40 MG tablet Commonly known as: Protonix Take 1 tablet (40 mg total) by mouth 2 (two) times daily.      ROS was provided with assistance of staff.  Review of Systems  Constitutional: Negative for activity change, appetite change, fatigue and fever.  HENT: Positive for hearing loss. Negative for congestion and voice change.   Respiratory: Negative for cough and shortness of breath.   Cardiovascular: Negative for chest pain and leg swelling.  Gastrointestinal: Negative for abdominal distention, abdominal pain, constipation, nausea and vomiting.  Genitourinary: Negative for difficulty urinating, dysuria and urgency.  Musculoskeletal: Positive for arthralgias, back pain and gait problem.  Skin: Negative for color change.  Neurological: Negative for dizziness, speech difficulty, weakness and headaches.       Dementia  Psychiatric/Behavioral: Negative for agitation, behavioral problems, hallucinations and sleep disturbance. The patient is not nervous/anxious.     Immunization History  Administered Date(s) Administered  . Influenza Whole 05/27/2012, 06/10/2013, 06/06/2018  . Influenza, High Dose Seasonal PF  05/30/2019  . Influenza-Unspecified 06/14/2014, 05/26/2015, 06/13/2016, 06/17/2017  . Moderna SARS-COVID-2 Vaccination 08/29/2019, 09/26/2019  . Pneumococcal Conjugate-13 06/24/2017  . Pneumococcal Polysaccharide-23 10/03/2011  . Td 10/03/2011  . Zoster 08/28/2007   Pertinent  Health Maintenance Due  Topic Date Due  . INFLUENZA VACCINE  Completed  . DEXA SCAN  Completed  . PNA vac Low Risk Adult  Completed   Fall Risk  03/27/2019 05/17/2017 09/15/2015 01/21/2014  Falls in the past year? (No Data) No No No  Comment [facility] Telephone Survey: data to providers prior to load - - -  Number falls in past yr: (No Data) - - -  Comment [facility]i Telephone Survey Actual Response =  - - -   Functional Status Survey:    Vitals:   11/02/19 1526  BP: (!) 146/80  Pulse: 86  Resp: 20  Temp: (!) 97.3 F (36.3 C)  SpO2: 96%  Weight: 175 lb 4.8 oz (79.5 kg)  Height: 5\' 2"  (1.575 m)  Body mass index is 32.06 kg/m. Physical Exam Vitals and nursing note reviewed.  Constitutional:      General: She is not in acute distress.    Appearance: Normal appearance. She is obese. She is not ill-appearing.  HENT:     Head: Normocephalic and atraumatic.     Mouth/Throat:     Mouth: Mucous membranes are moist.  Eyes:     Extraocular Movements: Extraocular movements intact.     Conjunctiva/sclera: Conjunctivae normal.     Pupils: Pupils are equal, round, and reactive to light.  Cardiovascular:     Rate and Rhythm: Normal rate and regular rhythm.     Heart sounds: No murmur.  Pulmonary:     Breath sounds: No wheezing, rhonchi or rales.  Chest:     Chest wall: No tenderness.  Abdominal:     General: Bowel sounds are normal. There is no distension.     Palpations: Abdomen is soft.     Tenderness: There is no abdominal tenderness. There is no guarding or rebound.  Musculoskeletal:     Cervical back: Normal range of motion and neck supple.     Right lower leg: No edema.     Left lower leg: Edema present.    Skin:    General: Skin is warm and dry.  Neurological:     General: No focal deficit present.     Mental Status: She is alert. Mental status is at baseline.     Coordination: Coordination normal.     Gait: Gait abnormal.     Comments: Oriented to self, her room  Psychiatric:        Mood and Affect: Mood normal.        Behavior: Behavior normal.     Labs reviewed: Recent Labs    07/21/19 0000 09/10/19 0000  NA 140 142  K 4.7 4.3  CL 106 108  CO2 26* 25*  BUN 18 20  CREATININE 0.9 0.8  CALCIUM 9.4 9.4   Recent Labs    07/21/19 0000 09/10/19 0000  AST 16 16  ALT 14 23  ALKPHOS 119 115  ALBUMIN 4.0 3.7   Recent Labs    07/21/19 0000 09/10/19 0000  WBC 7.9 7.0  NEUTROABS 4,866 4,256  HGB 14.9 14.2  HCT 45 43  PLT 297 295   Lab Results  Component Value Date   TSH 1.59 09/10/2019   Lab Results  Component Value Date   HGBA1C 5.6 04/24/2016   Lab Results  Component Value Date   CHOL 189 09/06/2015   HDL 65 09/06/2015   LDLCALC 99 09/06/2015   TRIG 126 09/06/2015    Significant Diagnostic Results in last 30 days:  No results found.  Assessment/Plan Fall reported fall 10/31/19 when the patient was found sitting on the floor by the head of the bed, the patient stated she just sat on the floor and could not get up. Unsteady gait/balance and poor safety awareness are contributory. Close supervision/assistance needed for safety.    Gastric erosions Stable, continue Pantoprazole 40mg  bid.  Dementia without behavioral disturbance ([place]) Continue SNF [facility] for safety, care assistance, continue Memantine, Donepezil for memory, close supervision/assistance for safety.   Osteoarthritis, multiple sites Pain is controlled, continue Tylenol, Norco, activity as tolerated.   Overactive bladder Stable, continue Myrbetriq     Family/ staff Communication: plan of care reviewed with the patient and charge nurse.   Labs/tests ordered:  none  Time spend 25  minutes.

## 2019-11-02 NOTE — Assessment & Plan Note (Signed)
Continue SNF [facility] for safety, care assistance, continue Memantine, Donepezil for memory, close supervision/assistance for safety.

## 2019-11-02 NOTE — Assessment & Plan Note (Addendum)
Pain is controlled, continue Tylenol, Norco, activity as tolerated.

## 2019-11-02 NOTE — Assessment & Plan Note (Signed)
reported fall 10/31/19 when the patient was found sitting on the floor by the head of the bed, the patient stated she just sat on the floor and could not get up. Unsteady gait/balance and poor safety awareness are contributory. Close supervision/assistance needed for safety.

## 2019-11-03 DIAGNOSIS — R2681 Unsteadiness on feet: Secondary | ICD-10-CM | POA: Diagnosis not present

## 2019-11-03 DIAGNOSIS — R41841 Cognitive communication deficit: Secondary | ICD-10-CM | POA: Diagnosis not present

## 2019-11-03 DIAGNOSIS — M150 Primary generalized (osteo)arthritis: Secondary | ICD-10-CM | POA: Diagnosis not present

## 2019-11-03 DIAGNOSIS — N3946 Mixed incontinence: Secondary | ICD-10-CM | POA: Diagnosis not present

## 2019-11-03 DIAGNOSIS — M6281 Muscle weakness (generalized): Secondary | ICD-10-CM | POA: Diagnosis not present

## 2019-11-04 DIAGNOSIS — Z20828 Contact with and (suspected) exposure to other viral communicable diseases: Secondary | ICD-10-CM | POA: Diagnosis not present

## 2019-11-04 DIAGNOSIS — N3946 Mixed incontinence: Secondary | ICD-10-CM | POA: Diagnosis not present

## 2019-11-04 DIAGNOSIS — M150 Primary generalized (osteo)arthritis: Secondary | ICD-10-CM | POA: Diagnosis not present

## 2019-11-04 DIAGNOSIS — R2681 Unsteadiness on feet: Secondary | ICD-10-CM | POA: Diagnosis not present

## 2019-11-04 DIAGNOSIS — R41841 Cognitive communication deficit: Secondary | ICD-10-CM | POA: Diagnosis not present

## 2019-11-04 DIAGNOSIS — M6281 Muscle weakness (generalized): Secondary | ICD-10-CM | POA: Diagnosis not present

## 2019-11-05 ENCOUNTER — Encounter: Payer: Self-pay | Admitting: Internal Medicine

## 2019-11-05 ENCOUNTER — Non-Acute Institutional Stay (SKILLED_NURSING_FACILITY): Payer: Medicare Other | Admitting: Internal Medicine

## 2019-11-05 DIAGNOSIS — M8949 Other hypertrophic osteoarthropathy, multiple sites: Secondary | ICD-10-CM

## 2019-11-05 DIAGNOSIS — N3946 Mixed incontinence: Secondary | ICD-10-CM | POA: Diagnosis not present

## 2019-11-05 DIAGNOSIS — F0390 Unspecified dementia without behavioral disturbance: Secondary | ICD-10-CM

## 2019-11-05 DIAGNOSIS — M7989 Other specified soft tissue disorders: Secondary | ICD-10-CM | POA: Diagnosis not present

## 2019-11-05 DIAGNOSIS — W19XXXD Unspecified fall, subsequent encounter: Secondary | ICD-10-CM | POA: Diagnosis not present

## 2019-11-05 DIAGNOSIS — R41841 Cognitive communication deficit: Secondary | ICD-10-CM | POA: Diagnosis not present

## 2019-11-05 DIAGNOSIS — M159 Polyosteoarthritis, unspecified: Secondary | ICD-10-CM

## 2019-11-05 DIAGNOSIS — M79642 Pain in left hand: Secondary | ICD-10-CM | POA: Diagnosis not present

## 2019-11-05 DIAGNOSIS — R2681 Unsteadiness on feet: Secondary | ICD-10-CM | POA: Diagnosis not present

## 2019-11-05 DIAGNOSIS — M150 Primary generalized (osteo)arthritis: Secondary | ICD-10-CM | POA: Diagnosis not present

## 2019-11-05 DIAGNOSIS — M6281 Muscle weakness (generalized): Secondary | ICD-10-CM | POA: Diagnosis not present

## 2019-11-05 NOTE — Progress Notes (Signed)
Location: [place] Room Number: 67-A Place of Service:  SNF [phone])  Provider:  Veleta Miners, MD  Code Status:  DNR Goals of Care:  Advanced Directives 11/05/2019  Does Patient Have a Medical Advance Directive? Yes  Type of Paramedic of [place];Living will;Out of facility DNR (pink MOST or yellow form)  Does patient want to make changes to medical advance directive? No - Patient declined  Copy of [place] in Chart? Yes - validated most recent copy scanned in chart (See row information)  Pre-existing out of facility DNR order (yellow form or pink MOST form) Yellow form placed in chart (order not valid for inpatient use)     Chief Complaint  Patient presents with  . Acute Visit    Patient seen for left hand pain.    HPI: Patient is a 86 y.o. female seen today for an acute visit for Left Hand Pain and Swelling after the fall Patient was moved from [facility] as she was needing more help with her ADLS due to her worsening dementia She has h/o Lumbago, Stress incontinence, Vertigo, h/o Duodenal Ulcer and dementia and Arthritis  Patient fell on 3/06.Per nurses it was unwitnessed fall.  She was found on the floor and was unable to get up.  Patient states she does not remember anything she does remember falling.  Yesterday the nurses noticed that her left Hand was swollen and she was guarding. Patient is left-handed. No other acute complaints  Past Medical History:  Diagnosis Date  . Allergic rhinitis due to pollen 07/01/2009  . Anemia, unspecified 09/07/2010  . Arthritis   . Benign paroxysmal positional vertigo   . Candidiasis 01/01/2012  . Cramp of limb 01/11/2011  . Disorder of bone and cartilage, unspecified 11/10/2010  . Dizziness and giddiness 11/10/2010  . Insomnia, unspecified 09/07/2010  . Knee pain, left anterior   . Lumbago 07/01/2009  . Lump or mass in breast 08/01/2012  . Memory loss 01/22/2012  . Myalgia and  myositis, unspecified 05/17/2011  . Myopathy, unspecified   . Osteoarthrosis, unspecified whether generalized or localized, lower leg   . Other abnormal glucose 03/23/2010  . Other chest pain 08/28/2012  . Other disorders of bone and cartilage(733.99) 09/19/2012  . Overactive bladder   . Rash    inner legs- ? med allergy- to see PCP if doesnt improve by end of week  . Rash and nonspecific skin eruption 10/28/2014  . Right bundle branch block   . Seborrheic keratoses 05/17/2011  . Spasm of muscle 01/11/2011  . Spinal stenosis, lumbar region, without neurogenic claudication   . Stress incontinence 07/08/2014  . Unspecified constipation 12/18/2011  . Unspecified tinnitus   . Unspecified urinary incontinence   . Urgency of urination   . Urinary frequency 09/18/2012    Past Surgical History:  Procedure Laterality Date  . ABDOMINAL HYSTERECTOMY    . APPENDECTOMY  1956  . BACK SURGERY     lumbar-- ? type  Dr Tonita Cong  . BREAST LUMPECTOMY  1993   breast cyst  . BREAST SURGERY     right lumpectomy  . CLEFT LIP REPAIR     with repair palate  . ESOPHAGOGASTRODUODENOSCOPY Left 03/10/2013   Procedure: ESOPHAGOGASTRODUODENOSCOPY (EGD);  Surgeon: Beryle Beams, MD;  Location: [facility];  Service: Endoscopy;  Laterality: Left;  . EYE SURGERY     bilateral cataract extraction with IOL  . TOTAL KNEE ARTHROPLASTY  12/13/2011   Procedure: TOTAL  KNEE ARTHROPLASTY;  Surgeon: Johnn Hai, MD;  Location: [facility];  Service: Orthopedics;  Laterality: Left;    No Known Allergies  Outpatient Encounter Medications as of 11/05/2019  Medication Sig  . acetaminophen (TYLENOL) 500 MG tablet Take 500 mg by mouth 2 (two) times daily.  . bisacodyl (DULCOLAX) 5 MG EC tablet Take 5 mg by mouth daily as needed.   . donepezil (ARICEPT) 5 MG tablet Take 5 mg by mouth at bedtime.  Marland Kitchen HYDROcodone-acetaminophen (NORCO/VICODIN) 5-325 MG tablet Take 1 tablet by mouth 2 (two) times daily. For back pain  . Lidocaine 4 % PTCH  Apply 4 % topically daily. Remove Lidocaine Patch every evening from left knee. 12 hours on 12 hours off  . meloxicam (MOBIC) 15 MG tablet Take 15 mg by mouth daily. Give with food and plenty of water x2 weeks  . memantine (NAMENDA) 5 MG tablet Take 10 mg by mouth 2 (two) times daily.   Marland Kitchen MYRBETRIQ 50 MG TB24 tablet Take 50 mg by mouth daily.   . pantoprazole (PROTONIX) 40 MG tablet Take 1 tablet (40 mg total) by mouth 2 (two) times daily.   No facility-administered encounter medications on file as of 11/05/2019.    Review of Systems:  Review of Systems  Constitutional: Negative.   HENT: Negative.   Respiratory: Negative.   Cardiovascular: Negative.   Gastrointestinal: Negative.   Genitourinary: Negative.   Musculoskeletal: Positive for gait problem.  Skin: Negative.   Neurological: Positive for weakness.  Psychiatric/Behavioral: Positive for confusion. The patient is nervous/anxious.     Health Maintenance  Topic Date Due  . TETANUS/TDAP  10/02/2021  . INFLUENZA VACCINE  Completed  . DEXA SCAN  Completed  . PNA vac Low Risk Adult  Completed    Physical Exam: Vitals:   11/05/19 1527  BP: 122/80  Pulse: 97  Resp: 20  Temp: 98.4 F (36.9 C)  TempSrc: Oral  SpO2: 95%  Weight: 176 lb 6.4 oz (80 kg)  Height: 5\' 2"  (1.575 m)   Body mass index is 32.26 kg/m. Physical Exam Vitals reviewed.  Constitutional:      Appearance: Normal appearance.  HENT:     Head: Normocephalic.     Nose: Nose normal.     Mouth/Throat:     Mouth: Mucous membranes are moist.     Pharynx: Oropharynx is clear.  Eyes:     Pupils: Pupils are equal, round, and reactive to light.  Cardiovascular:     Rate and Rhythm: Normal rate.     Pulses: Normal pulses.  Pulmonary:     Effort: Pulmonary effort is normal.     Breath sounds: Normal breath sounds.  Abdominal:     General: Abdomen is flat. Bowel sounds are normal.     Palpations: Abdomen is soft.  Musculoskeletal:     Comments: Swelling  in Left Hand Mostly around the base of Thumb. Tender in that area. Cannot Move her Thumb  Skin:    General: Skin is warm and dry.  Neurological:     General: No focal deficit present.     Mental Status: She is alert.  Psychiatric:        Mood and Affect: Mood normal.     Labs reviewed: Basic Metabolic Panel: Recent Labs    07/21/19 0000 09/10/19 0000  NA 140 142  K 4.7 4.3  CL 106 108  CO2 26* 25*  BUN 18 20  CREATININE 0.9 0.8  CALCIUM 9.4 9.4  TSH  --  1.59   Liver Function Tests: Recent Labs    07/21/19 0000 09/10/19 0000  AST 16 16  ALT 14 23  ALKPHOS 119 115  ALBUMIN 4.0 3.7   No results for input(s): LIPASE, AMYLASE in the last 8760 hours. No results for input(s): AMMONIA in the last 8760 hours. CBC: Recent Labs    07/21/19 0000 09/10/19 0000  WBC 7.9 7.0  NEUTROABS 4,866 4,256  HGB 14.9 14.2  HCT 45 43  PLT 297 295   Lipid Panel: No results for input(s): CHOL, HDL, LDLCALC, TRIG, CHOLHDL, LDLDIRECT in the last 8760 hours. Lab Results  Component Value Date   HGBA1C 5.6 04/24/2016    Procedures since last visit: No results found.  Assessment/Plan Left Hand Swelling S/P Fall Will do Xray of the hand to rule out the Fracture D/W therapy to immobilize the Thumb with Brace Has Tylenol and Norco for pain Addendum Xray was negative for any fractures Will Continue Brace and start her on Meloxicam for 2 weeks Other Issues  Essential hypertension Not on any meds Monitor her BP QD  Primary osteoarthritis involving multiple joints Right Knee Xray showed Moderate Arthritis Continue Tylenol and Norco for Pain  Dementia without behavioral disturbance, On Aricept Increase Namenda 10 mg BID MMSE was 20/30 in Facility  GERD On Protonix Urinary Incontinence On Myrbetriq Labs/tests ordered:  * No order type specified * Next appt:  Visit date not found

## 2019-11-06 ENCOUNTER — Other Ambulatory Visit: Payer: Self-pay | Admitting: *Deleted

## 2019-11-06 DIAGNOSIS — M159 Polyosteoarthritis, unspecified: Secondary | ICD-10-CM

## 2019-11-06 MED ORDER — HYDROCODONE-ACETAMINOPHEN 5-325 MG PO TABS: 1.0000 | ORAL_TABLET | Freq: Two times a day (BID) | ORAL | 0 refills | Status: DC

## 2019-11-06 NOTE — Telephone Encounter (Signed)
Received fax refill Request from [facility] °Pended Rx and sent to [name] for approval.  °

## 2019-11-09 DIAGNOSIS — M150 Primary generalized (osteo)arthritis: Secondary | ICD-10-CM | POA: Diagnosis not present

## 2019-11-09 DIAGNOSIS — R41841 Cognitive communication deficit: Secondary | ICD-10-CM | POA: Diagnosis not present

## 2019-11-09 DIAGNOSIS — R2681 Unsteadiness on feet: Secondary | ICD-10-CM | POA: Diagnosis not present

## 2019-11-09 DIAGNOSIS — M6281 Muscle weakness (generalized): Secondary | ICD-10-CM | POA: Diagnosis not present

## 2019-11-09 DIAGNOSIS — N3946 Mixed incontinence: Secondary | ICD-10-CM | POA: Diagnosis not present

## 2019-11-11 ENCOUNTER — Non-Acute Institutional Stay (SKILLED_NURSING_FACILITY): Payer: Medicare Other | Admitting: Nurse Practitioner

## 2019-11-11 ENCOUNTER — Encounter: Payer: Self-pay | Admitting: Nurse Practitioner

## 2019-11-11 DIAGNOSIS — M8949 Other hypertrophic osteoarthropathy, multiple sites: Secondary | ICD-10-CM

## 2019-11-11 DIAGNOSIS — I10 Essential (primary) hypertension: Secondary | ICD-10-CM | POA: Diagnosis not present

## 2019-11-11 DIAGNOSIS — F0390 Unspecified dementia without behavioral disturbance: Secondary | ICD-10-CM

## 2019-11-11 DIAGNOSIS — M159 Polyosteoarthritis, unspecified: Secondary | ICD-10-CM

## 2019-11-11 DIAGNOSIS — R2681 Unsteadiness on feet: Secondary | ICD-10-CM | POA: Diagnosis not present

## 2019-11-11 DIAGNOSIS — K257 Chronic gastric ulcer without hemorrhage or perforation: Secondary | ICD-10-CM | POA: Diagnosis not present

## 2019-11-11 DIAGNOSIS — N3281 Overactive bladder: Secondary | ICD-10-CM | POA: Diagnosis not present

## 2019-11-11 DIAGNOSIS — M150 Primary generalized (osteo)arthritis: Secondary | ICD-10-CM | POA: Diagnosis not present

## 2019-11-11 DIAGNOSIS — Z20828 Contact with and (suspected) exposure to other viral communicable diseases: Secondary | ICD-10-CM | POA: Diagnosis not present

## 2019-11-11 DIAGNOSIS — N3946 Mixed incontinence: Secondary | ICD-10-CM | POA: Diagnosis not present

## 2019-11-11 DIAGNOSIS — R41841 Cognitive communication deficit: Secondary | ICD-10-CM | POA: Diagnosis not present

## 2019-11-11 DIAGNOSIS — M6281 Muscle weakness (generalized): Secondary | ICD-10-CM | POA: Diagnosis not present

## 2019-11-11 NOTE — Assessment & Plan Note (Signed)
Blood pressure is controlled

## 2019-11-11 NOTE — Assessment & Plan Note (Signed)
Continue SNF [facility] for safety, care assistance, continue Memantine, Donepezil  

## 2019-11-11 NOTE — Assessment & Plan Note (Signed)
Stable, continue Pantoprazole.  

## 2019-11-11 NOTE — Assessment & Plan Note (Signed)
OA multiple sites, R knee pain, on 2 week course of Mobic started 11/05/19 for left hand/thumb, in brace, x-ray negative for fx, continue Tylenol 500mg  bid, Norco 5/325mg  bid

## 2019-11-11 NOTE — Assessment & Plan Note (Signed)
Chronic, continue Myrbetriq 

## 2019-11-11 NOTE — Progress Notes (Signed)
Location:   [place] Room Number: 105 Place of Service:  SNF (31) Provider:  Cesily Cuoco, NP  Mychal Durio X, NP  Patient Care Team: Zimal Weisensel X, NP as PCP - General (Internal Medicine) [name] Keeghan Bialy X, NP as Nurse Practitioner (Nurse Practitioner) [name], MD as Attending Physician (Urology) [name], MD as Consulting Physician (Gastroenterology)  Extended Emergency Contact Information Primary Emergency Contact: Lockner,[name] Address: [place] [address] [name] of [place] Phone: [phone] Relation: Son  Code Status:  DNR Goals of care: Advanced Directive information Advanced Directives 11/11/2019  Does Patient Have a Medical Advance Directive? Yes  Type of Paramedic of [place];Living will;Out of facility DNR (pink MOST or yellow form)  Does patient want to make changes to medical advance directive? No - Patient declined  Copy of [place] in Chart? Yes - validated most recent copy scanned in chart (See row information)  Pre-existing out of facility DNR order (yellow form or pink MOST form) Yellow form placed in chart (order not valid for inpatient use)     Chief Complaint  Patient presents with  . Medical Management of Chronic Issues    Routine Visit     HPI:  Pt is a 86 y.o. female seen today for medical management of chronic diseases.    The patient resides in [facility] for [facility], care assistance, OA multiple sites, R knee, on 2 week course of Mobic started 11/05/19 for left hand/thumb, in brace, on Tylenol 500mg  bid, Norco 5/325mg  bid. HTN, blood pressure is controlled. Dementia, on Donepezil, Memantine, past MMSE 20/30. GERD, stable, on Pantoprazole. Urinary frequency, stable, on Myrbetriq.    Past Medical History:  Diagnosis Date  . Allergic rhinitis due to pollen 07/01/2009  . Anemia, unspecified 09/07/2010  . Arthritis   .  Benign paroxysmal positional vertigo   . Candidiasis 01/01/2012  . Cramp of limb 01/11/2011  . Disorder of bone and cartilage, unspecified 11/10/2010  . Dizziness and giddiness 11/10/2010  . Insomnia, unspecified 09/07/2010  . Knee pain, left anterior   . Lumbago 07/01/2009  . Lump or mass in breast 08/01/2012  . Memory loss 01/22/2012  . Myalgia and myositis, unspecified 05/17/2011  . Myopathy, unspecified   . Osteoarthrosis, unspecified whether generalized or localized, lower leg   . Other abnormal glucose 03/23/2010  . Other chest pain 08/28/2012  . Other disorders of bone and cartilage(733.99) 09/19/2012  . Overactive bladder   . Rash    inner legs- ? med allergy- to see PCP if doesnt improve by end of week  . Rash and nonspecific skin eruption 10/28/2014  . Right bundle branch block   . Seborrheic keratoses 05/17/2011  . Spasm of muscle 01/11/2011  . Spinal stenosis, lumbar region, without neurogenic claudication   . Stress incontinence 07/08/2014  . Unspecified constipation 12/18/2011  . Unspecified tinnitus   . Unspecified urinary incontinence   . Urgency of urination   . Urinary frequency 09/18/2012   Past Surgical History:  Procedure Laterality Date  . ABDOMINAL HYSTERECTOMY    . APPENDECTOMY  1956  . BACK SURGERY     lumbar-- ? type  Dr Tonita Cong  . BREAST LUMPECTOMY  1993   breast cyst  . BREAST SURGERY     right lumpectomy  . CLEFT LIP REPAIR     with repair palate  . ESOPHAGOGASTRODUODENOSCOPY Left 03/10/2013  Procedure: ESOPHAGOGASTRODUODENOSCOPY (EGD);  Surgeon: Beryle Beams, MD;  Location: [facility];  Service: Endoscopy;  Laterality: Left;  . EYE SURGERY     bilateral cataract extraction with IOL  . TOTAL KNEE ARTHROPLASTY  12/13/2011   Procedure: TOTAL KNEE ARTHROPLASTY;  Surgeon: Johnn Hai, MD;  Location: [facility];  Service: Orthopedics;  Laterality: Left;    No Known Allergies  Allergies as of 11/11/2019   No Known Allergies     Medication List        Accurate as of November 11, 2019 11:59 PM. If you have any questions, ask your nurse or doctor.        acetaminophen 500 MG tablet Commonly known as: TYLENOL Take 500 mg by mouth 2 (two) times daily.   bisacodyl 5 MG EC tablet Commonly known as: DULCOLAX Take 5 mg by mouth daily as needed.   donepezil 5 MG tablet Commonly known as: ARICEPT Take 5 mg by mouth at bedtime.   HYDROcodone-acetaminophen 5-325 MG tablet Commonly known as: NORCO/VICODIN Take 1 tablet by mouth 2 (two) times daily. For back pain   Lidocaine 4 % Ptch Apply 4 % topically daily. Remove Lidocaine Patch every evening from left knee. 12 hours on 12 hours off   meloxicam 15 MG tablet Commonly known as: MOBIC Take 15 mg by mouth daily. Give with food and plenty of water x2 weeks   memantine 10 MG tablet Commonly known as: NAMENDA Take 10 mg by mouth 2 (two) times daily. What changed: Another medication with the same name was removed. Continue taking this medication, and follow the directions you see here. Changed by: Oliviah Agostini X Havish Petties, NP   Myrbetriq 50 MG Tb24 tablet Generic drug: mirabegron ER Take 50 mg by mouth daily.   pantoprazole 40 MG tablet Commonly known as: Protonix Take 1 tablet (40 mg total) by mouth 2 (two) times daily.      ROS was provided with assistance of staff.  Review of Systems  Constitutional: Negative for activity change, appetite change, chills, diaphoresis, fatigue, fever and unexpected weight change.  HENT: Positive for hearing loss. Negative for congestion and voice change.   Respiratory: Negative for cough, shortness of breath and wheezing.   Cardiovascular: Negative for chest pain and leg swelling.  Gastrointestinal: Negative for abdominal distention, abdominal pain, constipation, diarrhea, nausea and vomiting.  Genitourinary: Negative for difficulty urinating, dysuria and urgency.  Musculoskeletal: Positive for arthralgias, back pain and gait problem.  Skin: Negative for color  change.  Neurological: Negative for dizziness, speech difficulty, weakness and headaches.       Dementia  Psychiatric/Behavioral: Positive for confusion. Negative for agitation, behavioral problems, hallucinations and sleep disturbance. The patient is not nervous/anxious.     Immunization History  Administered Date(s) Administered  . Influenza Whole 05/27/2012, 06/10/2013, 06/06/2018  . Influenza, High Dose Seasonal PF 05/30/2019  . Influenza-Unspecified 06/14/2014, 05/26/2015, 06/13/2016, 06/17/2017  . Moderna SARS-COVID-2 Vaccination 08/29/2019, 09/26/2019  . Pneumococcal Conjugate-13 06/24/2017  . Pneumococcal Polysaccharide-23 10/03/2011  . Td 10/03/2011  . Zoster 08/28/2007   Pertinent  Health Maintenance Due  Topic Date Due  . INFLUENZA VACCINE  Completed  . DEXA SCAN  Completed  . PNA vac Low Risk Adult  Completed   Fall Risk  03/27/2019 05/17/2017 09/15/2015 01/21/2014  Falls in the past year? (No Data) No No No  Comment Emmi Telephone Survey: data to providers prior to load - - -  Number falls in past yr: (No Data) - - -  Comment Emmi Telephone Survey Actual Response =  - - -   Functional Status Survey:    Vitals:   11/11/19 1148  BP: 122/80  Pulse: 97  Resp: 20  Temp: 97.8 F (36.6 C)  SpO2: 90%  Weight: 176 lb 6.4 oz (80 kg)  Height: 5\' 2"  (1.575 m)   Body mass index is 32.26 kg/m. Physical Exam Vitals and nursing note reviewed.  Constitutional:      General: She is not in acute distress.    Appearance: Normal appearance. She is obese. She is not ill-appearing, toxic-appearing or diaphoretic.  HENT:     Head: Normocephalic and atraumatic.     Mouth/Throat:     Mouth: Mucous membranes are moist.  Eyes:     Extraocular Movements: Extraocular movements intact.     Conjunctiva/sclera: Conjunctivae normal.     Pupils: Pupils are equal, round, and reactive to light.  Cardiovascular:     Rate and Rhythm: Normal rate and regular rhythm.     Heart sounds: No  murmur.  Pulmonary:     Breath sounds: No wheezing, rhonchi or rales.  Abdominal:     General: Bowel sounds are normal. There is no distension.     Palpations: Abdomen is soft.     Tenderness: There is no abdominal tenderness. There is no right CVA tenderness, left CVA tenderness, guarding or rebound.  Musculoskeletal:     Cervical back: Normal range of motion and neck supple.     Right lower leg: No edema.     Left lower leg: Edema present.  Skin:    General: Skin is warm and dry.  Neurological:     General: No focal deficit present.     Mental Status: She is alert. Mental status is at baseline.     Motor: No weakness.     Coordination: Coordination normal.     Gait: Gait abnormal.     Comments: Oriented to self, her room  Psychiatric:        Mood and Affect: Mood normal.        Behavior: Behavior normal.     Labs reviewed: Recent Labs    07/21/19 0000 09/10/19 0000  NA 140 142  K 4.7 4.3  CL 106 108  CO2 26* 25*  BUN 18 20  CREATININE 0.9 0.8  CALCIUM 9.4 9.4   Recent Labs    07/21/19 0000 09/10/19 0000  AST 16 16  ALT 14 23  ALKPHOS 119 115  ALBUMIN 4.0 3.7   Recent Labs    07/21/19 0000 09/10/19 0000  WBC 7.9 7.0  NEUTROABS 4,866 4,256  HGB 14.9 14.2  HCT 45 43  PLT 297 295   Lab Results  Component Value Date   TSH 1.59 09/10/2019   Lab Results  Component Value Date   HGBA1C 5.6 04/24/2016   Lab Results  Component Value Date   CHOL 189 09/06/2015   HDL 65 09/06/2015   LDLCALC 99 09/06/2015   TRIG 126 09/06/2015    Significant Diagnostic Results in last 30 days:  No results found.  Assessment/Plan Gastric erosions Stable, continue Pantoprazole.   Dementia without behavioral disturbance ([place]) Continue SNF [facility] for safety, care assistance, continue Memantine, Donepezil.   Osteoarthritis, multiple sites OA multiple sites, R knee pain, on 2 week course of Mobic started 11/05/19 for left hand/thumb, in brace, x-ray negative for fx,  continue Tylenol 500mg  bid, Norco 5/325mg  bid  Overactive bladder Chronic, continue Myrbetriq  HTN (hypertension) Blood  pressure is controlled.      Family/ staff Communication: plan of care reviewed with the patient and charge nurse.   Labs/tests ordered:  none  Time spend 25 minutes.

## 2019-11-12 DIAGNOSIS — M6281 Muscle weakness (generalized): Secondary | ICD-10-CM | POA: Diagnosis not present

## 2019-11-12 DIAGNOSIS — N3946 Mixed incontinence: Secondary | ICD-10-CM | POA: Diagnosis not present

## 2019-11-12 DIAGNOSIS — R2681 Unsteadiness on feet: Secondary | ICD-10-CM | POA: Diagnosis not present

## 2019-11-12 DIAGNOSIS — M150 Primary generalized (osteo)arthritis: Secondary | ICD-10-CM | POA: Diagnosis not present

## 2019-11-12 DIAGNOSIS — R41841 Cognitive communication deficit: Secondary | ICD-10-CM | POA: Diagnosis not present

## 2019-11-17 DIAGNOSIS — M150 Primary generalized (osteo)arthritis: Secondary | ICD-10-CM | POA: Diagnosis not present

## 2019-11-17 DIAGNOSIS — M6281 Muscle weakness (generalized): Secondary | ICD-10-CM | POA: Diagnosis not present

## 2019-11-17 DIAGNOSIS — R41841 Cognitive communication deficit: Secondary | ICD-10-CM | POA: Diagnosis not present

## 2019-11-17 DIAGNOSIS — R2681 Unsteadiness on feet: Secondary | ICD-10-CM | POA: Diagnosis not present

## 2019-11-17 DIAGNOSIS — N3946 Mixed incontinence: Secondary | ICD-10-CM | POA: Diagnosis not present

## 2019-11-18 DIAGNOSIS — N3946 Mixed incontinence: Secondary | ICD-10-CM | POA: Diagnosis not present

## 2019-11-18 DIAGNOSIS — M150 Primary generalized (osteo)arthritis: Secondary | ICD-10-CM | POA: Diagnosis not present

## 2019-11-18 DIAGNOSIS — R41841 Cognitive communication deficit: Secondary | ICD-10-CM | POA: Diagnosis not present

## 2019-11-18 DIAGNOSIS — R2681 Unsteadiness on feet: Secondary | ICD-10-CM | POA: Diagnosis not present

## 2019-11-18 DIAGNOSIS — M6281 Muscle weakness (generalized): Secondary | ICD-10-CM | POA: Diagnosis not present

## 2019-11-30 ENCOUNTER — Encounter: Payer: Self-pay | Admitting: Nurse Practitioner

## 2019-11-30 ENCOUNTER — Non-Acute Institutional Stay (SKILLED_NURSING_FACILITY): Payer: Medicare Other | Admitting: Nurse Practitioner

## 2019-11-30 DIAGNOSIS — N3281 Overactive bladder: Secondary | ICD-10-CM | POA: Diagnosis not present

## 2019-11-30 DIAGNOSIS — H109 Unspecified conjunctivitis: Secondary | ICD-10-CM | POA: Insufficient documentation

## 2019-11-30 DIAGNOSIS — M8949 Other hypertrophic osteoarthropathy, multiple sites: Secondary | ICD-10-CM | POA: Diagnosis not present

## 2019-11-30 DIAGNOSIS — H1033 Unspecified acute conjunctivitis, bilateral: Secondary | ICD-10-CM

## 2019-11-30 DIAGNOSIS — M159 Polyosteoarthritis, unspecified: Secondary | ICD-10-CM

## 2019-11-30 DIAGNOSIS — F0390 Unspecified dementia without behavioral disturbance: Secondary | ICD-10-CM

## 2019-11-30 DIAGNOSIS — K257 Chronic gastric ulcer without hemorrhage or perforation: Secondary | ICD-10-CM

## 2019-11-30 DIAGNOSIS — M150 Primary generalized (osteo)arthritis: Secondary | ICD-10-CM

## 2019-11-30 NOTE — Progress Notes (Signed)
Location:   [facility] Room Number: 21 Place of Service:  SNF (31) Provider:  Kolette Vey X, NP  Jaquay Morneault X, NP  Patient Care Team: Dalin Caldera X, NP as PCP - General (Internal Medicine) [name] Rolonda Pontarelli X, NP as Nurse Practitioner (Nurse Practitioner) [name], MD as Attending Physician (Urology) [name], MD as Consulting Physician (Gastroenterology)  Extended Emergency Contact Information Primary Emergency Contact: Lepage,[name] Address: [place] [address] [name] of [place] Phone: [phone] Relation: Son  Code Status:  DNR Goals of care: Advanced Directive information Advanced Directives 11/30/2019  Does Patient Have a Medical Advance Directive? Yes  Type of Advance Directive Out of facility DNR (pink MOST or yellow form);Living will  Does patient want to make changes to medical advance directive? No - Guardian declined  Copy of [place] in Chart? -  Pre-existing out of facility DNR order (yellow form or pink MOST form) Yellow form placed in chart (order not valid for inpatient use)     Chief Complaint  Patient presents with  . Acute Visit    Reddened eyes    HPI:  Pt is a 86 y.o. female seen today for an acute visit for redness R+L eyes, the patient denied itching or pain in eyes, stated she may need to wash her eyes, staff reported the patient rubbed her eyes, no drainage or change of vision. The patient resides in SNF [facility] for safety, care assistance, on Memantine, Donepezil for memory. OA multiple sites, aches/pains are controlled on Tylenol 500mg  bid, Norco bid. GERD, stable, on Pantoprazole 40mg  bid. Urinary frequency, stable, on Myrbetriq 50mg  qd.    Past Medical History:  Diagnosis Date  . Allergic rhinitis due to pollen 07/01/2009  . Anemia, unspecified 09/07/2010  . Arthritis   . Benign paroxysmal positional vertigo   . Candidiasis 01/01/2012  .  Cramp of limb 01/11/2011  . Disorder of bone and cartilage, unspecified 11/10/2010  . Dizziness and giddiness 11/10/2010  . Insomnia, unspecified 09/07/2010  . Knee pain, left anterior   . Lumbago 07/01/2009  . Lump or mass in breast 08/01/2012  . Memory loss 01/22/2012  . Myalgia and myositis, unspecified 05/17/2011  . Myopathy, unspecified   . Osteoarthrosis, unspecified whether generalized or localized, lower leg   . Other abnormal glucose 03/23/2010  . Other chest pain 08/28/2012  . Other disorders of bone and cartilage(733.99) 09/19/2012  . Overactive bladder   . Rash    inner legs- ? med allergy- to see PCP if doesnt improve by end of week  . Rash and nonspecific skin eruption 10/28/2014  . Right bundle branch block   . Seborrheic keratoses 05/17/2011  . Spasm of muscle 01/11/2011  . Spinal stenosis, lumbar region, without neurogenic claudication   . Stress incontinence 07/08/2014  . Unspecified constipation 12/18/2011  . Unspecified tinnitus   . Unspecified urinary incontinence   . Urgency of urination   . Urinary frequency 09/18/2012   Past Surgical History:  Procedure Laterality Date  . ABDOMINAL HYSTERECTOMY    . APPENDECTOMY  1956  . BACK SURGERY     lumbar-- ? type  Dr Tonita Cong  . BREAST LUMPECTOMY  1993   breast cyst  . BREAST SURGERY     right lumpectomy  . CLEFT LIP REPAIR     with repair palate  . ESOPHAGOGASTRODUODENOSCOPY Left 03/10/2013   Procedure: ESOPHAGOGASTRODUODENOSCOPY (EGD);  Surgeon: Beryle Beams, MD;  Location: [facility];  Service: Endoscopy;  Laterality: Left;  . EYE SURGERY     bilateral cataract extraction with IOL  . TOTAL KNEE ARTHROPLASTY  12/13/2011   Procedure: TOTAL KNEE ARTHROPLASTY;  Surgeon: Johnn Hai, MD;  Location: [facility];  Service: Orthopedics;  Laterality: Left;    No Known Allergies  Allergies as of 11/30/2019   No Known Allergies     Medication List       Accurate as of November 30, 2019 11:59 PM. If you have any questions, ask  your nurse or doctor.        acetaminophen 500 MG tablet Commonly known as: TYLENOL Take 500 mg by mouth 2 (two) times daily.   bisacodyl 5 MG EC tablet Commonly known as: DULCOLAX Take 5 mg by mouth daily as needed.   donepezil 5 MG tablet Commonly known as: ARICEPT Take 5 mg by mouth at bedtime.   HYDROcodone-acetaminophen 5-325 MG tablet Commonly known as: NORCO/VICODIN Take 1 tablet by mouth 2 (two) times daily. For back pain   Lidocaine 4 % Ptch Apply 4 % topically daily. Remove Lidocaine Patch every evening from left knee. 12 hours on 12 hours off   memantine 10 MG tablet Commonly known as: NAMENDA Take 10 mg by mouth 2 (two) times daily.   Myrbetriq 50 MG Tb24 tablet Generic drug: mirabegron ER Take 50 mg by mouth daily.   pantoprazole 40 MG tablet Commonly known as: Protonix Take 1 tablet (40 mg total) by mouth 2 (two) times daily.       Review of Systems  Constitutional: Negative for activity change, appetite change, fatigue and fever.  HENT: Positive for hearing loss. Negative for congestion and voice change.   Eyes: Positive for redness. Negative for pain, discharge, itching and visual disturbance.       Irritation of both eye, the patient rubbed.   Respiratory: Negative for cough and shortness of breath.   Cardiovascular: Negative for chest pain and leg swelling.  Gastrointestinal: Negative for abdominal distention, abdominal pain and constipation.  Genitourinary: Negative for difficulty urinating, dysuria and urgency.  Musculoskeletal: Positive for arthralgias, back pain and gait problem.  Skin: Negative for color change.  Neurological: Negative for speech difficulty, weakness, light-headedness and headaches.       Dementia  Psychiatric/Behavioral: Positive for confusion. Negative for agitation, behavioral problems and sleep disturbance.    Immunization History  Administered Date(s) Administered  . Influenza Whole 05/27/2012, 06/10/2013, 06/06/2018   . Influenza, High Dose Seasonal PF 05/30/2019  . Influenza-Unspecified 06/14/2014, 05/26/2015, 06/13/2016, 06/17/2017  . Moderna SARS-COVID-2 Vaccination 08/29/2019, 09/26/2019  . Pneumococcal Conjugate-13 06/24/2017  . Pneumococcal Polysaccharide-23 10/03/2011  . Td 10/03/2011  . Zoster 08/28/2007   Pertinent  Health Maintenance Due  Topic Date Due  . INFLUENZA VACCINE  03/27/2020  . DEXA SCAN  Completed  . PNA vac Low Risk Adult  Completed   Fall Risk  03/27/2019 05/17/2017 09/15/2015 01/21/2014  Falls in the past year? (No Data) No No No  Comment Emmi Telephone Survey: data to providers prior to load - - -  Number falls in past yr: (No Data) - - -  Comment Emmi Telephone Survey Actual Response =  - - -   Functional Status Survey:    Vitals:   11/30/19 1035  BP: 138/68  Pulse: 78  Resp: 20  Temp: 98 F (36.7 C)  SpO2: 92%  Weight: 177 lb 1.6 oz (80.3 kg)  Height:  5\' 2"  (1.575 m)   Body mass index is 32.39 kg/m. Physical Exam Vitals and nursing note reviewed.  Constitutional:      General: She is not in acute distress.    Appearance: Normal appearance. She is obese. She is not ill-appearing.  HENT:     Head: Normocephalic and atraumatic.     Nose: Nose normal.     Mouth/Throat:     Mouth: Mucous membranes are moist.  Eyes:     General:        Right eye: No discharge.        Left eye: No discharge.     Extraocular Movements: Extraocular movements intact.     Conjunctiva/sclera:     Right eye: Right conjunctiva is injected.     Left eye: Left conjunctiva is injected.     Pupils: Pupils are equal, round, and reactive to light.  Cardiovascular:     Rate and Rhythm: Normal rate and regular rhythm.     Heart sounds: No murmur.  Pulmonary:     Breath sounds: No rales.  Abdominal:     General: Bowel sounds are normal. There is no distension.     Palpations: Abdomen is soft.     Tenderness: There is no abdominal tenderness.  Musculoskeletal:     Cervical back:  Normal range of motion and neck supple.     Right lower leg: No edema.     Left lower leg: No edema.  Skin:    General: Skin is warm and dry.  Neurological:     General: No focal deficit present.     Mental Status: She is alert. Mental status is at baseline.     Motor: No weakness.     Coordination: Coordination normal.     Gait: Gait abnormal.     Comments: Oriented to self, her room  Psychiatric:        Mood and Affect: Mood normal.        Behavior: Behavior normal.     Labs reviewed: Recent Labs    07/21/19 0000 09/10/19 0000  NA 140 142  K 4.7 4.3  CL 106 108  CO2 26* 25*  BUN 18 20  CREATININE 0.9 0.8  CALCIUM 9.4 9.4   Recent Labs    07/21/19 0000 09/10/19 0000  AST 16 16  ALT 14 23  ALKPHOS 119 115  ALBUMIN 4.0 3.7   Recent Labs    07/21/19 0000 09/10/19 0000  WBC 7.9 7.0  NEUTROABS 4,866 4,256  HGB 14.9 14.2  HCT 45 43  PLT 297 295   Lab Results  Component Value Date   TSH 1.59 09/10/2019   Lab Results  Component Value Date   HGBA1C 5.6 04/24/2016   Lab Results  Component Value Date   CHOL 189 09/06/2015   HDL 65 09/06/2015   LDLCALC 99 09/06/2015   TRIG 126 09/06/2015    Significant Diagnostic Results in last 30 days:  No results found.  Assessment/Plan Conjunctivitis Denied itching or pain in eyes, stated she may need to wash her eyes, staff reported the patient rubbed her eyes, no drainage or change of vision, will Naphcon A 1gtt bid OU x 1 week  Gastric erosions Stable, continue Pantoprazole 40mg  bid.   Dementia without behavioral disturbance ([place]) Continue SNF [facility] for safety, care assistance, continue Memantine, Donepezil for memory.   Osteoarthritis, multiple sites Pain is controlled, continue Tylenol, Norco.   Overactive bladder Stable, continue Myrtetriq  Family/ staff Communication: plan of care reviewed with the patient and charge nurse.   Labs/tests ordered: none  Time spend 25 minutes.

## 2019-11-30 NOTE — Assessment & Plan Note (Addendum)
Denied itching or pain in eyes, stated she may need to wash her eyes, staff reported the patient rubbed her eyes, no drainage or change of vision, will Naphcon A 1gtt bid OU x 1 week

## 2019-12-03 ENCOUNTER — Encounter: Payer: Self-pay | Admitting: Nurse Practitioner

## 2019-12-03 NOTE — Assessment & Plan Note (Signed)
Continue SNF [facility] for safety, care assistance, continue Memantine, Donepezil for memory.

## 2019-12-03 NOTE — Assessment & Plan Note (Addendum)
Stable, continue Myrtetriq

## 2019-12-03 NOTE — Assessment & Plan Note (Signed)
Stable, continue Pantoprazole 40mg bid.  

## 2019-12-03 NOTE — Assessment & Plan Note (Signed)
Pain is controlled, continue Tylenol, Norco °

## 2019-12-18 ENCOUNTER — Non-Acute Institutional Stay (SKILLED_NURSING_FACILITY): Payer: Medicare Other | Admitting: Nurse Practitioner

## 2019-12-18 ENCOUNTER — Encounter: Payer: Self-pay | Admitting: Nurse Practitioner

## 2019-12-18 DIAGNOSIS — K257 Chronic gastric ulcer without hemorrhage or perforation: Secondary | ICD-10-CM

## 2019-12-18 DIAGNOSIS — N3281 Overactive bladder: Secondary | ICD-10-CM | POA: Diagnosis not present

## 2019-12-18 DIAGNOSIS — M159 Polyosteoarthritis, unspecified: Secondary | ICD-10-CM

## 2019-12-18 DIAGNOSIS — F0390 Unspecified dementia without behavioral disturbance: Secondary | ICD-10-CM | POA: Diagnosis not present

## 2019-12-18 DIAGNOSIS — M8949 Other hypertrophic osteoarthropathy, multiple sites: Secondary | ICD-10-CM

## 2019-12-18 NOTE — Progress Notes (Signed)
Location:   SNF [place] Room Number: 67 Place of Service:  SNF (31) Provider: Lennie Odor Seydou Hearns NP  Nyal Schachter X, NP  Patient Care Team: Makenlee Mckeag X, NP as PCP - General (Internal Medicine) [name] Marisella Puccio X, NP as Nurse Practitioner (Nurse Practitioner) [name], MD as Attending Physician (Urology) [name], MD as Consulting Physician (Gastroenterology)  Extended Emergency Contact Information Primary Emergency Contact: Odle,[name] Address: [place] [address] [name] of [place] Phone: [phone] Relation: Son  Code Status:  DNR Goals of care: Advanced Directive information Advanced Directives 12/18/2019  Does Patient Have a Medical Advance Directive? Yes  Type of Advance Directive Living will;Out of facility DNR (pink MOST or yellow form)  Does patient want to make changes to medical advance directive? No - Patient declined  Copy of [place] in Chart? -  Pre-existing out of facility DNR order (yellow form or pink MOST form) Yellow form placed in chart (order not valid for inpatient use)     Chief Complaint  Patient presents with  . Medical Management of Chronic Issues    HPI:  Pt is a 86 y.o. female seen today for medical management of chronic diseases.    The patient resides in SNF [facility] for safety, care assistance, ambulates with walker, on Memantine 10mg  bid, Donepezil 5mg  qd for memory. Hx of OA, stable, on Norco 5/325mg  bid, Tylenol 500mg  bid. GERD, stable, on Pantoprazole 40mg  bid, urinary frequency, managed on Myrbetriq 50mg  qd.    Past Medical History:  Diagnosis Date  . Allergic rhinitis due to pollen 07/01/2009  . Anemia, unspecified 09/07/2010  . Arthritis   . Benign paroxysmal positional vertigo   . Candidiasis 01/01/2012  . Cramp of limb 01/11/2011  . Disorder of bone and cartilage, unspecified 11/10/2010  . Dizziness and giddiness 11/10/2010  . Insomnia, unspecified  09/07/2010  . Knee pain, left anterior   . Lumbago 07/01/2009  . Lump or mass in breast 08/01/2012  . Memory loss 01/22/2012  . Myalgia and myositis, unspecified 05/17/2011  . Myopathy, unspecified   . Osteoarthrosis, unspecified whether generalized or localized, lower leg   . Other abnormal glucose 03/23/2010  . Other chest pain 08/28/2012  . Other disorders of bone and cartilage(733.99) 09/19/2012  . Overactive bladder   . Rash    inner legs- ? med allergy- to see PCP if doesnt improve by end of week  . Rash and nonspecific skin eruption 10/28/2014  . Right bundle branch block   . Seborrheic keratoses 05/17/2011  . Spasm of muscle 01/11/2011  . Spinal stenosis, lumbar region, without neurogenic claudication   . Stress incontinence 07/08/2014  . Unspecified constipation 12/18/2011  . Unspecified tinnitus   . Unspecified urinary incontinence   . Urgency of urination   . Urinary frequency 09/18/2012   Past Surgical History:  Procedure Laterality Date  . ABDOMINAL HYSTERECTOMY    . APPENDECTOMY  1956  . BACK SURGERY     lumbar-- ? type  Dr Tonita Cong  . BREAST LUMPECTOMY  1993   breast cyst  . BREAST SURGERY     right lumpectomy  . CLEFT LIP REPAIR     with repair palate  . ESOPHAGOGASTRODUODENOSCOPY Left 03/10/2013   Procedure: ESOPHAGOGASTRODUODENOSCOPY (EGD);  Surgeon: Beryle Beams, MD;  Location: [facility];  Service: Endoscopy;  Laterality: Left;  . EYE SURGERY     bilateral cataract extraction  with IOL  . TOTAL KNEE ARTHROPLASTY  12/13/2011   Procedure: TOTAL KNEE ARTHROPLASTY;  Surgeon: Johnn Hai, MD;  Location: [facility];  Service: Orthopedics;  Laterality: Left;    No Known Allergies  Allergies as of 12/18/2019   No Known Allergies     Medication List       Accurate as of December 18, 2019  2:44 PM. If you have any questions, ask your nurse or doctor.        acetaminophen 500 MG tablet Commonly known as: TYLENOL Take 500 mg by mouth 2 (two) times daily.   bisacodyl  5 MG EC tablet Commonly known as: DULCOLAX Take 5 mg by mouth daily as needed.   donepezil 5 MG tablet Commonly known as: ARICEPT Take 5 mg by mouth at bedtime.   HYDROcodone-acetaminophen 5-325 MG tablet Commonly known as: NORCO/VICODIN Take 1 tablet by mouth 2 (two) times daily. For back pain   Lidocaine 4 % Ptch Apply 4 % topically daily. Remove Lidocaine Patch every evening from left knee. 12 hours on 12 hours off   memantine 10 MG tablet Commonly known as: NAMENDA Take 10 mg by mouth 2 (two) times daily.   Myrbetriq 50 MG Tb24 tablet Generic drug: mirabegron ER Take 50 mg by mouth daily.   pantoprazole 40 MG tablet Commonly known as: Protonix Take 1 tablet (40 mg total) by mouth 2 (two) times daily.       Review of Systems  Constitutional: Positive for unexpected weight change. Negative for activity change, appetite change, fatigue and fever.  HENT: Positive for hearing loss. Negative for congestion and voice change.   Eyes: Negative for visual disturbance.       Irritation of both eye, the patient rubbed.   Respiratory: Negative for cough and shortness of breath.   Cardiovascular: Negative for leg swelling.  Gastrointestinal: Negative for abdominal distention, abdominal pain and constipation.  Genitourinary: Negative for difficulty urinating, dysuria and urgency.  Musculoskeletal: Positive for arthralgias, back pain and gait problem.  Skin: Negative for color change.  Neurological: Negative for dizziness, speech difficulty and weakness.       Dementia  Psychiatric/Behavioral: Positive for confusion. Negative for behavioral problems and sleep disturbance.    Immunization History  Administered Date(s) Administered  . Influenza Whole 05/27/2012, 06/10/2013, 06/06/2018  . Influenza, High Dose Seasonal PF 05/30/2019  . Influenza-Unspecified 06/14/2014, 05/26/2015, 06/13/2016, 06/17/2017  . Moderna SARS-COVID-2 Vaccination 08/29/2019, 09/26/2019  . Pneumococcal  Conjugate-13 06/24/2017  . Pneumococcal Polysaccharide-23 10/03/2011  . Td 10/03/2011  . Zoster 08/28/2007   Pertinent  Health Maintenance Due  Topic Date Due  . INFLUENZA VACCINE  03/27/2020  . DEXA SCAN  Completed  . PNA vac Low Risk Adult  Completed   Fall Risk  03/27/2019 05/17/2017 09/15/2015 01/21/2014  Falls in the past year? (No Data) No No No  Comment Emmi Telephone Survey: data to providers prior to load - - -  Number falls in past yr: (No Data) - - -  Comment Emmi Telephone Survey Actual Response =  - - -   Functional Status Survey:    Vitals:   12/18/19 1431  BP: 130/68  Pulse: 72  Resp: 20  Temp: 98 F (36.7 C)  SpO2: 92%  Weight: 177 lb 1.6 oz (80.3 kg)  Height: 5\' 2"  (1.575 m)   Body mass index is 32.39 kg/m. Physical Exam Vitals and nursing note reviewed.  Constitutional:      General: She is not in acute  distress.    Appearance: Normal appearance. She is obese. She is not ill-appearing.  HENT:     Head: Normocephalic and atraumatic.     Nose: Nose normal.     Mouth/Throat:     Mouth: Mucous membranes are moist.  Eyes:     Extraocular Movements: Extraocular movements intact.     Conjunctiva/sclera:     Right eye: Right conjunctiva is not injected.     Left eye: Left conjunctiva is not injected.     Pupils: Pupils are equal, round, and reactive to light.  Cardiovascular:     Rate and Rhythm: Normal rate and regular rhythm.     Heart sounds: No murmur.  Pulmonary:     Breath sounds: No rales.  Abdominal:     General: Bowel sounds are normal. There is no distension.     Palpations: Abdomen is soft.     Tenderness: There is no abdominal tenderness.  Musculoskeletal:     Cervical back: Normal range of motion and neck supple.     Right lower leg: No edema.     Left lower leg: No edema.  Skin:    General: Skin is warm and dry.  Neurological:     General: No focal deficit present.     Mental Status: She is alert. Mental status is at baseline.      Gait: Gait abnormal.     Comments: Oriented to self, her room  Psychiatric:        Mood and Affect: Mood normal.        Behavior: Behavior normal.     Labs reviewed: Recent Labs    07/21/19 0000 09/10/19 0000  NA 140 142  K 4.7 4.3  CL 106 108  CO2 26* 25*  BUN 18 20  CREATININE 0.9 0.8  CALCIUM 9.4 9.4   Recent Labs    07/21/19 0000 09/10/19 0000  AST 16 16  ALT 14 23  ALKPHOS 119 115  ALBUMIN 4.0 3.7   Recent Labs    07/21/19 0000 09/10/19 0000  WBC 7.9 7.0  NEUTROABS 4,866 4,256  HGB 14.9 14.2  HCT 45 43  PLT 297 295   Lab Results  Component Value Date   TSH 1.59 09/10/2019   Lab Results  Component Value Date   HGBA1C 5.6 04/24/2016   Lab Results  Component Value Date   CHOL 189 09/06/2015   HDL 65 09/06/2015   LDLCALC 99 09/06/2015   TRIG 126 09/06/2015    Significant Diagnostic Results in last 30 days:  No results found.  Assessment/Plan  Gastric erosions Stable, continue Pantoprazole 40mg  bid.   Dementia without behavioral disturbance ([place]) Stable, continue SNF [facility] for safety, care assistance, ambulates with walker, continue Memantine, Donepezil for memory.   Osteoarthritis, multiple sites Pain is controlled, continue Norco, Tylenol.   Overactive bladder No urinary retention, continue Myrbetriq   Family/ staff Communication: plan of care reviewed with the patient and charge nurse.   Labs/tests ordered: none  Time spend 25 minutes.

## 2019-12-18 NOTE — Progress Notes (Signed)
Location:   [place] Room Number: 3 Place of Service:  SNF (31) Provider:  Marlana Latus NP  Mast, Man X, NP  Patient Care Team: Mast, Man X, NP as PCP - General (Internal Medicine) [name] Mast, Man X, NP as Nurse Practitioner (Nurse Practitioner) [name], MD as Attending Physician (Urology) [name], MD as Consulting Physician (Gastroenterology)  Extended Emergency Contact Information Primary Emergency Contact: Geron,[name] Address: [place] [address] [name] of [place] Phone: [phone] Relation: Son  Code Status:  DNR Goals of care: Advanced Directive information Advanced Directives 12/18/2019  Does Patient Have a Medical Advance Directive? Yes  Type of Advance Directive Living will;Out of facility DNR (pink MOST or yellow form)  Does patient want to make changes to medical advance directive? No - Patient declined  Copy of [place] in Chart? -  Pre-existing out of facility DNR order (yellow form or pink MOST form) Yellow form placed in chart (order not valid for inpatient use)     Chief Complaint  Patient presents with  . Medical Management of Chronic Issues    HPI:  Pt is a 86 y.o. female seen today for medical management of chronic diseases.     Past Medical History:  Diagnosis Date  . Allergic rhinitis due to pollen 07/01/2009  . Anemia, unspecified 09/07/2010  . Arthritis   . Benign paroxysmal positional vertigo   . Candidiasis 01/01/2012  . Cramp of limb 01/11/2011  . Disorder of bone and cartilage, unspecified 11/10/2010  . Dizziness and giddiness 11/10/2010  . Insomnia, unspecified 09/07/2010  . Knee pain, left anterior   . Lumbago 07/01/2009  . Lump or mass in breast 08/01/2012  . Memory loss 01/22/2012  . Myalgia and myositis, unspecified 05/17/2011  . Myopathy, unspecified   . Osteoarthrosis, unspecified whether generalized or localized,  lower leg   . Other abnormal glucose 03/23/2010  . Other chest pain 08/28/2012  . Other disorders of bone and cartilage(733.99) 09/19/2012  . Overactive bladder   . Rash    inner legs- ? med allergy- to see PCP if doesnt improve by end of week  . Rash and nonspecific skin eruption 10/28/2014  . Right bundle branch block   . Seborrheic keratoses 05/17/2011  . Spasm of muscle 01/11/2011  . Spinal stenosis, lumbar region, without neurogenic claudication   . Stress incontinence 07/08/2014  . Unspecified constipation 12/18/2011  . Unspecified tinnitus   . Unspecified urinary incontinence   . Urgency of urination   . Urinary frequency 09/18/2012   Past Surgical History:  Procedure Laterality Date  . ABDOMINAL HYSTERECTOMY    . APPENDECTOMY  1956  . BACK SURGERY     lumbar-- ? type  Dr Tonita Cong  . BREAST LUMPECTOMY  1993   breast cyst  . BREAST SURGERY     right lumpectomy  . CLEFT LIP REPAIR     with repair palate  . ESOPHAGOGASTRODUODENOSCOPY Left 03/10/2013   Procedure: ESOPHAGOGASTRODUODENOSCOPY (EGD);  Surgeon: Beryle Beams, MD;  Location: [facility];  Service: Endoscopy;  Laterality: Left;  . EYE SURGERY     bilateral cataract extraction with IOL  . TOTAL KNEE ARTHROPLASTY  12/13/2011   Procedure: TOTAL KNEE ARTHROPLASTY;  Surgeon: Johnn Hai, MD;  [facility]: [facility];  Service: Orthopedics;  Laterality: Left;    No Known Allergies  Allergies as of 12/18/2019   No Known  Allergies     Medication List       Accurate as of December 18, 2019  2:37 PM. If you have any questions, ask your nurse or doctor.        acetaminophen 500 MG tablet Commonly known as: TYLENOL Take 500 mg by mouth 2 (two) times daily.   bisacodyl 5 MG EC tablet Commonly known as: DULCOLAX Take 5 mg by mouth daily as needed.   donepezil 5 MG tablet Commonly known as: ARICEPT Take 5 mg by mouth at bedtime.   HYDROcodone-acetaminophen 5-325 MG tablet Commonly known as: NORCO/VICODIN Take 1 tablet by  mouth 2 (two) times daily. For back pain   Lidocaine 4 % Ptch Apply 4 % topically daily. Remove Lidocaine Patch every evening from left knee. 12 hours on 12 hours off   memantine 10 MG tablet Commonly known as: NAMENDA Take 10 mg by mouth 2 (two) times daily.   Myrbetriq 50 MG Tb24 tablet Generic drug: mirabegron ER Take 50 mg by mouth daily.   pantoprazole 40 MG tablet Commonly known as: Protonix Take 1 tablet (40 mg total) by mouth 2 (two) times daily.       Review of Systems  Immunization History  Administered Date(s) Administered  . Influenza Whole 05/27/2012, 06/10/2013, 06/06/2018  . Influenza, High Dose Seasonal PF 05/30/2019  . Influenza-Unspecified 06/14/2014, 05/26/2015, 06/13/2016, 06/17/2017  . Moderna SARS-COVID-2 Vaccination 08/29/2019, 09/26/2019  . Pneumococcal Conjugate-13 06/24/2017  . Pneumococcal Polysaccharide-23 10/03/2011  . Td 10/03/2011  . Zoster 08/28/2007   Pertinent  Health Maintenance Due  Topic Date Due  . INFLUENZA VACCINE  03/27/2020  . DEXA SCAN  Completed  . PNA vac Low Risk Adult  Completed   Fall Risk  03/27/2019 05/17/2017 09/15/2015 01/21/2014  Falls in the past year? (No Data) No No No  Comment Emmi Telephone Survey: data to providers prior to load - - -  Number falls in past yr: (No Data) - - -  Comment Emmi Telephone Survey Actual Response =  - - -   Functional Status Survey:    Vitals:   12/18/19 1431  BP: 130/68  Pulse: 72  Resp: 20  Temp: 98 F (36.7 C)  SpO2: 92%  Weight: 177 lb 1.6 oz (80.3 kg)  Height: 5\' 2"  (1.575 m)   Body mass index is 32.39 kg/m. Physical Exam  Labs reviewed: Recent Labs    07/21/19 0000 09/10/19 0000  NA 140 142  K 4.7 4.3  CL 106 108  CO2 26* 25*  BUN 18 20  CREATININE 0.9 0.8  CALCIUM 9.4 9.4   Recent Labs    07/21/19 0000 09/10/19 0000  AST 16 16  ALT 14 23  ALKPHOS 119 115  ALBUMIN 4.0 3.7   Recent Labs    07/21/19 0000 09/10/19 0000  WBC 7.9 7.0  NEUTROABS  4,866 4,256  HGB 14.9 14.2  HCT 45 43  PLT 297 295   Lab Results  Component Value Date   TSH 1.59 09/10/2019   Lab Results  Component Value Date   HGBA1C 5.6 04/24/2016   Lab Results  Component Value Date   CHOL 189 09/06/2015   HDL 65 09/06/2015   LDLCALC 99 09/06/2015   TRIG 126 09/06/2015    Significant Diagnostic Results in last 30 days:  No results found.  Assessment/Plan There are no diagnoses linked to this encounter.   Family/ staff Communication:   Labs/tests ordered:

## 2019-12-18 NOTE — Assessment & Plan Note (Signed)
Stable, continue [facility] for safety, care assistance, ambulates with walker, continue Memantine, Donepezil for memory.

## 2019-12-18 NOTE — Assessment & Plan Note (Signed)
Stable, continue Pantoprazole 40mg bid.  

## 2019-12-18 NOTE — Assessment & Plan Note (Signed)
Pain is controlled, continue Norco, Tylenol.

## 2019-12-18 NOTE — Assessment & Plan Note (Signed)
No urinary retention, continue Myrbetriq 

## 2020-01-05 ENCOUNTER — Other Ambulatory Visit: Payer: Self-pay | Admitting: Internal Medicine

## 2020-01-05 ENCOUNTER — Encounter: Payer: Self-pay | Admitting: Internal Medicine

## 2020-01-05 ENCOUNTER — Non-Acute Institutional Stay (SKILLED_NURSING_FACILITY): Payer: Medicare Other | Admitting: Internal Medicine

## 2020-01-05 DIAGNOSIS — N3281 Overactive bladder: Secondary | ICD-10-CM | POA: Diagnosis not present

## 2020-01-05 DIAGNOSIS — M159 Polyosteoarthritis, unspecified: Secondary | ICD-10-CM

## 2020-01-05 DIAGNOSIS — F0390 Unspecified dementia without behavioral disturbance: Secondary | ICD-10-CM | POA: Diagnosis not present

## 2020-01-05 DIAGNOSIS — M8949 Other hypertrophic osteoarthropathy, multiple sites: Secondary | ICD-10-CM | POA: Diagnosis not present

## 2020-01-05 DIAGNOSIS — I10 Essential (primary) hypertension: Secondary | ICD-10-CM | POA: Diagnosis not present

## 2020-01-05 MED ORDER — HYDROCODONE-ACETAMINOPHEN 5-325 MG PO TABS: 1.0000 | ORAL_TABLET | Freq: Two times a day (BID) | ORAL | 0 refills | Status: DC

## 2020-01-05 NOTE — Progress Notes (Signed)
Location:   [place] Room Number: 72 Place of Service:  SNF (31) Provider:  Veleta Miners MD   Mast, Man X, NP  Patient Care Team: Mast, Man X, NP as PCP - General (Internal Medicine) [name] Mast, Man X, NP as Nurse Practitioner (Nurse Practitioner) [name], MD as Attending Physician (Urology) [name], MD as Consulting Physician (Gastroenterology)  Extended Emergency Contact Information Primary Emergency Contact: Lavery,[name] Address: [place] [address] [name] of [place] Phone: [phone] Relation: Son  Code Status:  DNR Goals of care: Advanced Directive information Advanced Directives 01/05/2020  Does Patient Have a Medical Advance Directive? Yes  Type of Paramedic of [place];Living will;Out of facility DNR (pink MOST or yellow form)  Does patient want to make changes to medical advance directive? No - Patient declined  Copy of [place] in Chart? Yes - validated most recent copy scanned in chart (See row information)  Pre-existing out of facility DNR order (yellow form or pink MOST form) Yellow form placed in chart (order not valid for inpatient use)     Chief Complaint  Patient presents with  . Medical Management of Chronic Issues    HPI:  Pt is a 86 y.o. female seen today for medical management of chronic diseases.    Patient was moved from [place] as she was needing more help with her ADLS due to her worsening dementia She has h/o Lumbago, Stress incontinence, Vertigo, h/o Duodenal Ulcer and dementia and Arthritis  Doing well.Weight stable No New Nursing issues Walks with her walker. Needs assist with her ADLS   Past Medical History:  Diagnosis Date  . Allergic rhinitis due to pollen 07/01/2009  . Anemia, unspecified 09/07/2010  . Arthritis   . Benign paroxysmal positional vertigo   . Candidiasis 01/01/2012  . Cramp of  limb 01/11/2011  . Disorder of bone and cartilage, unspecified 11/10/2010  . Dizziness and giddiness 11/10/2010  . Insomnia, unspecified 09/07/2010  . Knee pain, left anterior   . Lumbago 07/01/2009  . Lump or mass in breast 08/01/2012  . Memory loss 01/22/2012  . Myalgia and myositis, unspecified 05/17/2011  . Myopathy, unspecified   . Osteoarthrosis, unspecified whether generalized or localized, lower leg   . Other abnormal glucose 03/23/2010  . Other chest pain 08/28/2012  . Other disorders of bone and cartilage(733.99) 09/19/2012  . Overactive bladder   . Rash    inner legs- ? med allergy- to see PCP if doesnt improve by end of week  . Rash and nonspecific skin eruption 10/28/2014  . Right bundle branch block   . Seborrheic keratoses 05/17/2011  . Spasm of muscle 01/11/2011  . Spinal stenosis, lumbar region, without neurogenic claudication   . Stress incontinence 07/08/2014  . Unspecified constipation 12/18/2011  . Unspecified tinnitus   . Unspecified urinary incontinence   . Urgency of urination   . Urinary frequency 09/18/2012   Past Surgical History:  Procedure Laterality Date  . ABDOMINAL HYSTERECTOMY    . APPENDECTOMY  1956  . BACK SURGERY     lumbar-- ? type  Dr Tonita Cong  . BREAST LUMPECTOMY  1993   breast cyst  . BREAST SURGERY     right lumpectomy  . CLEFT LIP REPAIR     with repair palate  . ESOPHAGOGASTRODUODENOSCOPY Left 03/10/2013   Procedure: ESOPHAGOGASTRODUODENOSCOPY (EGD);  Surgeon: Beryle Beams, MD;  Location: [facility];  Service: Endoscopy;  Laterality: Left;  . EYE SURGERY     bilateral cataract extraction with IOL  . TOTAL KNEE ARTHROPLASTY  12/13/2011   Procedure: TOTAL KNEE ARTHROPLASTY;  Surgeon: Johnn Hai, MD;  Location: [facility];  Service: Orthopedics;  Laterality: Left;    No Known Allergies  Allergies as of 01/05/2020   No Known Allergies     Medication List       Accurate as of Jan 05, 2020 12:08 PM. If you have any questions, ask your nurse  or doctor.        acetaminophen 500 MG tablet Commonly known as: TYLENOL Take 500 mg by mouth 2 (two) times daily.   bisacodyl 5 MG EC tablet Commonly known as: DULCOLAX Take 5 mg by mouth daily as needed.   donepezil 5 MG tablet Commonly known as: ARICEPT Take 5 mg by mouth at bedtime.   HYDROcodone-acetaminophen 5-325 MG tablet Commonly known as: NORCO/VICODIN Take 1 tablet by mouth 2 (two) times daily. For back pain   Lidocaine 4 % Ptch Apply 4 % topically daily. Remove Lidocaine Patch every evening from left knee. 12 hours on 12 hours off   memantine 10 MG tablet Commonly known as: NAMENDA Take 10 mg by mouth 2 (two) times daily.   Myrbetriq 50 MG Tb24 tablet Generic drug: mirabegron ER Take 50 mg by mouth daily.   pantoprazole 40 MG tablet Commonly known as: Protonix Take 1 tablet (40 mg total) by mouth 2 (two) times daily.       Review of Systems  Denies Everything. ? Dementia Says' I am doing real Good '  Immunization History  Administered Date(s) Administered  . Influenza Whole 05/27/2012, 06/10/2013, 06/06/2018  . Influenza, High Dose Seasonal PF 05/30/2019  . Influenza-Unspecified 06/14/2014, 05/26/2015, 06/13/2016, 06/17/2017  . Moderna SARS-COVID-2 Vaccination 08/29/2019, 09/26/2019  . Pneumococcal Conjugate-13 06/24/2017  . Pneumococcal Polysaccharide-23 10/03/2011  . Td 10/03/2011  . Zoster 08/28/2007   Pertinent  Health Maintenance Due  Topic Date Due  . INFLUENZA VACCINE  03/27/2020  . DEXA SCAN  Completed  . PNA vac Low Risk Adult  Completed   Fall Risk  03/27/2019 05/17/2017 09/15/2015 01/21/2014  Falls in the past year? (No Data) No No No  Comment Emmi Telephone Survey: data to providers prior to load - - -  Number falls in past yr: (No Data) - - -  Comment Emmi Telephone Survey Actual Response =  - - -   Functional Status Survey:    Vitals:   01/05/20 1154  BP: 132/70  Pulse: 74  Resp: 17  Temp: (!) 97.1 F (36.2 C)  SpO2:  95%  Weight: 175 lb 14.4 oz (79.8 kg)  Height: 5\' 2"  (1.575 m)   Body mass index is 32.17 kg/m. Physical Exam  Constitutional:Well-developed and well-nourished.  HENT:  Head: Normocephalic.  Mouth/Throat: Oropharynx is clear and moist.  Eyes: Pupils are equal, round, and reactive to light.  Neck: Neck supple.  Cardiovascular: Normal rate and normal heart sounds.  No murmur heard. Pulmonary/Chest: Effort normal and breath sounds normal. No respiratory distress. No wheezes. She has no rales.  Abdominal: Soft. Bowel sounds are normal. No distension. There is no tenderness. There is no rebound.  Musculoskeletal: No edema.  Lymphadenopathy: none Neurological:No Focal Deficits Skin: Skin is warm and dry.  Psychiatric: Normal mood and affect. Behavior is normal. Thought content normal.    Labs reviewed: Recent Labs    07/21/19 0000 09/10/19  0000  NA 140 142  K 4.7 4.3  CL 106 108  CO2 26* 25*  BUN 18 20  CREATININE 0.9 0.8  CALCIUM 9.4 9.4   Recent Labs    07/21/19 0000 09/10/19 0000  AST 16 16  ALT 14 23  ALKPHOS 119 115  ALBUMIN 4.0 3.7   Recent Labs    07/21/19 0000 09/10/19 0000  WBC 7.9 7.0  NEUTROABS 4,866 4,256  HGB 14.9 14.2  HCT 45 43  PLT 297 295   Lab Results  Component Value Date   TSH 1.59 09/10/2019   Lab Results  Component Value Date   HGBA1C 5.6 04/24/2016   Lab Results  Component Value Date   CHOL 189 09/06/2015   HDL 65 09/06/2015   LDLCALC 99 09/06/2015   TRIG 126 09/06/2015    Significant Diagnostic Results in last 30 days:  No results found.  Assessment/Plan  Essential hypertension BP stable Off All meds  Primary osteoarthritis involving multiple joints Right Knee Xray showed Moderate Arthritis Continue on Tylenol and Norco Dementia without behavioral disturbance, MMSE 20/30 On Namenda and Aricept GERD Continue Protonix Urinary Incontinence On Myrbetriq   Family/ staff Communication:   Labs/tests ordered:    Total time spent in this patient care encounter was  25_  minutes; greater than 50% of the visit spent counseling patient and staff, reviewing records , Labs and coordinating care for problems addressed at this encounter.

## 2020-02-10 ENCOUNTER — Encounter: Payer: Self-pay | Admitting: Nurse Practitioner

## 2020-02-10 ENCOUNTER — Non-Acute Institutional Stay (SKILLED_NURSING_FACILITY): Payer: Medicare Other | Admitting: Nurse Practitioner

## 2020-02-10 DIAGNOSIS — N3281 Overactive bladder: Secondary | ICD-10-CM

## 2020-02-10 DIAGNOSIS — Z8719 Personal history of other diseases of the digestive system: Secondary | ICD-10-CM | POA: Diagnosis not present

## 2020-02-10 DIAGNOSIS — F0390 Unspecified dementia without behavioral disturbance: Secondary | ICD-10-CM

## 2020-02-10 DIAGNOSIS — M159 Polyosteoarthritis, unspecified: Secondary | ICD-10-CM

## 2020-02-10 DIAGNOSIS — M8949 Other hypertrophic osteoarthropathy, multiple sites: Secondary | ICD-10-CM | POA: Diagnosis not present

## 2020-02-10 DIAGNOSIS — M150 Primary generalized (osteo)arthritis: Secondary | ICD-10-CM

## 2020-02-10 DIAGNOSIS — R21 Rash and other nonspecific skin eruption: Secondary | ICD-10-CM

## 2020-02-10 DIAGNOSIS — K5901 Slow transit constipation: Secondary | ICD-10-CM | POA: Diagnosis not present

## 2020-02-10 NOTE — Assessment & Plan Note (Signed)
No behaviors, continue [facility] for safety, care assistance, continue Memantine, Donepezil, update CBC/diff, CMP/eGFR

## 2020-02-10 NOTE — Assessment & Plan Note (Signed)
Stable, continue prn Bisacodyl.

## 2020-02-10 NOTE — Progress Notes (Signed)
Location:   [place] Room Number: 37 Place of Service:  SNF (31) Provider:  Quanisha Drewry, NP    Patient Care Team: Bevan Vu X, NP as PCP - General (Internal Medicine) [name] Eugenia Eldredge X, NP as Nurse Practitioner (Nurse Practitioner) [name], MD as Attending Physician (Urology) [name], MD as Consulting Physician (Gastroenterology)  Extended Emergency Contact Information Primary Emergency Contact: Janet Hernandez,Janet Hernandez Address: [place] [address] Janet Hernandez of [place] Phone: [phone] Relation: Son  Code Status:  DNR Goals of care: Advanced Directive information Advanced Directives 02/10/2020  Does Patient Have a Medical Advance Directive? Yes  Type of Paramedic of [place];Living will;Out of facility DNR (pink MOST or yellow form)  Does patient want to make changes to medical advance directive? No - Patient declined  Copy of [place] in Chart? Yes - validated most recent copy scanned in chart (See row information)  Pre-existing out of facility DNR order (yellow form or pink MOST form) Yellow form placed in chart (order not valid for inpatient use)     Chief Complaint  Patient presents with   Medical Management of Chronic Issues    Routine Visit.     HPI:  Pt is a 86 y.o. female seen today for medical management of chronic diseases.   Hx of dementia, on Memantine 55m bid, Donepezil 531mqd for memory. Hx of OA, stable, on Norco 5/32556mid, Tylenol 500m39md. GERD/PUD, stable, on Pantoprazole 40mg87m, urinary frequency, managed on Myrbetriq 50mg 85mconstipation, stable, on prn Bisacodyl 5mg.  16mPast Medical History:  Diagnosis Date   Allergic rhinitis due to pollen 07/01/2009   Anemia, unspecified 09/07/2010   Arthritis    Benign paroxysmal positional vertigo    Candidiasis 01/01/2012   Cramp of limb 01/11/2011   Disorder of bone  and cartilage, unspecified 11/10/2010   Dizziness and giddiness 11/10/2010   Insomnia, unspecified 09/07/2010   Knee pain, left anterior    Lumbago 07/01/2009   Lump or mass in breast 08/01/2012   Memory loss 01/22/2012   Myalgia and myositis, unspecified 05/17/2011   Myopathy, unspecified    Osteoarthrosis, unspecified whether generalized or localized, lower leg    Other abnormal glucose 03/23/2010   Other chest pain 08/28/2012   Other disorders of bone and cartilage(733.99) 09/19/2012   Overactive bladder    Rash    inner legs- ? med allergy- to see PCP if doesnt improve by end of week   Rash and nonspecific skin eruption 10/28/2014   Right bundle branch block    Seborrheic keratoses 05/17/2011   Spasm of muscle 01/11/2011   Spinal stenosis, lumbar region, without neurogenic claudication    Stress incontinence 07/08/2014   Unspecified constipation 12/18/2011   Unspecified tinnitus    Unspecified urinary incontinence    Urgency of urination    Urinary frequency 09/18/2012   Past Surgical History:  Procedure Laterality Date   ABDOMINAL HYSTERECTOMY     APPENDECTOMY  1956   BACK SURGERY     lumbar-- ? type  Dr Beane  [facility]  1993   breast cyst   BREAST SURGERY     right lumpectomy   CLEFT LIP REPAIR     with repair palate   ESOPHAGOGASTRODUODENOSCOPY Left 03/10/2013   Procedure: ESOPHAGOGASTRODUODENOSCOPY (EGD);  Surgeon: [facility]eryle [facility];  Service:  Endoscopy;  Laterality: Left;   EYE SURGERY     bilateral cataract extraction with IOL   TOTAL KNEE ARTHROPLASTY  12/13/2011   Procedure: TOTAL KNEE ARTHROPLASTY;  Surgeon: Johnn Hai, MD;  [facility]: [facility];  Service: Orthopedics;  Laterality: Left;    No Known Allergies  Allergies as of 02/10/2020   No Known Allergies     Medication List       Accurate as of February 10, 2020  2:29 PM. If you have any questions, ask your nurse or doctor.        acetaminophen  500 MG tablet Commonly known as: TYLENOL Take 500 mg by mouth 2 (two) times daily.   bisacodyl 5 MG EC tablet Commonly known as: DULCOLAX Take 5 mg by mouth daily as needed.   donepezil 5 MG tablet Commonly known as: ARICEPT Take 5 mg by mouth at bedtime.   HYDROcodone-acetaminophen 5-325 MG tablet Commonly known as: NORCO/VICODIN Take 1 tablet by mouth 2 (two) times daily. For back pain   Lidocaine 4 % Ptch Apply 4 % topically daily. Remove Lidocaine Patch every evening from left knee. 12 hours on 12 hours off   memantine 10 MG tablet Commonly known as: NAMENDA Take 10 mg by mouth 2 (two) times daily.   Myrbetriq 50 MG Tb24 tablet Generic drug: mirabegron ER Take 50 mg by mouth daily.   pantoprazole 40 MG tablet Commonly known as: Protonix Take 1 tablet (40 mg total) by mouth 2 (two) times daily.       Review of Systems  Constitutional: Negative for appetite change, fatigue, fever and unexpected weight change.  HENT: Positive for hearing loss. Negative for congestion and voice change.   Eyes: Negative for visual disturbance.       Irritation of both eye, the patient rubbed.   Respiratory: Negative for cough.   Cardiovascular: Negative for leg swelling.  Gastrointestinal: Negative for abdominal pain and constipation.  Genitourinary: Negative for difficulty urinating, dysuria and urgency.  Musculoskeletal: Positive for arthralgias, back pain and gait problem.  Skin: Positive for rash. Negative for color change.  Neurological: Negative for speech difficulty, light-headedness and headaches.       Dementia  Psychiatric/Behavioral: Positive for confusion. Negative for behavioral problems and sleep disturbance. The patient is not nervous/anxious.     Immunization History  Administered Date(s) Administered   Influenza Whole 05/27/2012, 06/10/2013, 06/06/2018   Influenza, High Dose Seasonal PF 05/30/2019   Influenza-Unspecified 06/14/2014, 05/26/2015, 06/13/2016,  06/17/2017   Moderna SARS-COVID-2 Vaccination 08/29/2019, 09/26/2019   Pneumococcal Conjugate-13 06/24/2017   Pneumococcal Polysaccharide-23 10/03/2011   Td 10/03/2011   Zoster 08/28/2007   Pertinent  Health Maintenance Due  Topic Date Due   INFLUENZA VACCINE  03/27/2020   DEXA SCAN  Completed   PNA vac Low Risk Adult  Completed   Fall Risk  03/27/2019 05/17/2017 09/15/2015 01/21/2014  Falls in the past year? (No Data) No No No  Comment Emmi Telephone Survey: data to providers prior to load - - -  Number falls in past yr: (No Data) - - -  Comment Emmi Telephone Survey Actual Response =  - - -   Functional Status Survey:    Vitals:   02/10/20 1121  BP: 118/68  Pulse: 78  Resp: 16  Temp: (!) 97 F (36.1 C)  SpO2: 93%  Weight: 177 lb 3.2 oz (80.4 kg)  Height: _0  (1.575 m)   Body mass index is 32.41 kg/m. Physical Exam Vitals and nursing  note reviewed.  Constitutional:      Appearance: Normal appearance. She is obese.  HENT:     Head: Normocephalic and atraumatic.     Mouth/Throat:     Mouth: Mucous membranes are moist.  Eyes:     Extraocular Movements: Extraocular movements intact.     Conjunctiva/sclera:     Right eye: Right conjunctiva is not injected.     Left eye: Left conjunctiva is not injected.     Pupils: Pupils are equal, round, and reactive to light.  Cardiovascular:     Rate and Rhythm: Normal rate and regular rhythm.     Heart sounds: No murmur heard.   Pulmonary:     Breath sounds: No rales.  Abdominal:     General: Bowel sounds are normal.     Palpations: Abdomen is soft.     Tenderness: There is no abdominal tenderness.  Musculoskeletal:     Cervical back: Normal range of motion and neck supple.     Right lower leg: No edema.     Left lower leg: No edema.  Skin:    General: Skin is warm and dry.     Findings: Rash present.     Comments: Itching raised rah area in the middle of chin.   Neurological:     General: No focal deficit  present.     Mental Status: She is alert. Mental status is at baseline.     Gait: Gait abnormal.     Comments: Oriented to self, her room  Psychiatric:        Mood and Affect: Mood normal.        Behavior: Behavior normal.     Labs reviewed: Recent Labs    07/21/19 0000 09/10/19 0000  NA 140 142  K 4.7 4.3  CL 106 108  CO2 26* 25*  BUN 18 20  CREATININE 0.9 0.8  CALCIUM 9.4 9.4   Recent Labs    07/21/19 0000 09/10/19 0000  AST 16 16  ALT 14 23  ALKPHOS 119 115  ALBUMIN 4.0 3.7   Recent Labs    07/21/19 0000 09/10/19 0000  WBC 7.9 7.0  NEUTROABS 4,866 4,256  HGB 14.9 14.2  HCT 45 43  PLT 297 295   Lab Results  Component Value Date   TSH 1.59 09/10/2019   Lab Results  Component Value Date   HGBA1C 5.6 04/24/2016   Lab Results  Component Value Date   CHOL 189 09/06/2015   HDL 65 09/06/2015   LDLCALC 99 09/06/2015   TRIG 126 09/06/2015    Significant Diagnostic Results in last 30 days:  No results found.  Assessment/Plan Rash of face Mid of chin, itching, will apply 0.5% Triamcinolone cream bid to affected x 7 days, Dermatology referral if no better.   Dementia without behavioral disturbance (HCC) No behaviors, continue SN[facility] for safety, care assistance, continue Memantine, Donepezil, update CBC/diff, CMP/eGFR  Osteoarthritis, multiple sites Mostly in the right knee, continue Norco, Tylenol.   Overactive bladder Stable, continue Myrbetriq, off Oxybutynin 08/22/16  History of duodenal ulcer Stable,continue chronic Pantoprazole.   Slow transit constipation Stable, continue prn Bisacodyl.      Family/ staff Communication: plan of care reviewed with the patient and charge nurse.   Labs/tests ordered:  CBC/diff, CMP/eGFR  Time spend 25 minutes.

## 2020-02-10 NOTE — Assessment & Plan Note (Signed)
Stable, continue Myrbetriq, off Oxybutynin 08/22/16

## 2020-02-10 NOTE — Assessment & Plan Note (Signed)
Stable,continue chronic Pantoprazole.

## 2020-02-10 NOTE — Assessment & Plan Note (Signed)
Mostly in the right knee, continue Norco, Tylenol.

## 2020-02-10 NOTE — Assessment & Plan Note (Signed)
Mid of chin, itching, will apply 0.5% Triamcinolone cream bid to affected x 7 days, Dermatology referral if no better.

## 2020-02-11 LAB — HEPATIC FUNCTION PANEL
ALT: 14 (ref 7–35)
AST: 12 — AB (ref 13–35)
Alkaline Phosphatase: 118 (ref 25–125)
Bilirubin, Total: 0.4

## 2020-02-11 LAB — BASIC METABOLIC PANEL
BUN: 18 (ref 4–21)
CO2: 27 — AB (ref 13–22)
Chloride: 107 (ref 99–108)
Creatinine: 0.8 (ref 0.5–1.1)
Glucose: 90
Potassium: 4.1 (ref 3.4–5.3)
Sodium: 142 (ref 137–147)

## 2020-02-11 LAB — COMPREHENSIVE METABOLIC PANEL
Albumin: 4 (ref 3.5–5.0)
Calcium: 9.1 (ref 8.7–10.7)
Globulin: 2.2

## 2020-02-11 LAB — CBC AND DIFFERENTIAL
HCT: 44 (ref 36–46)
Hemoglobin: 14.6 (ref 12.0–16.0)
Neutrophils Absolute: 3802
Platelets: 235 (ref 150–399)
WBC: 6.4

## 2020-02-11 LAB — CBC: RBC: 5.1 (ref 3.87–5.11)

## 2020-03-21 ENCOUNTER — Other Ambulatory Visit: Payer: Self-pay | Admitting: *Deleted

## 2020-03-21 DIAGNOSIS — M159 Polyosteoarthritis, unspecified: Secondary | ICD-10-CM

## 2020-03-21 MED ORDER — HYDROCODONE-ACETAMINOPHEN 5-325 MG PO TABS: 1.0000 | ORAL_TABLET | Freq: Two times a day (BID) | ORAL | 0 refills | Status: DC

## 2020-03-21 NOTE — Telephone Encounter (Signed)
Received fax from pharmacy Pended Rx and sent to [name] for approval.  

## 2020-04-22 ENCOUNTER — Non-Acute Institutional Stay (SKILLED_NURSING_FACILITY): Payer: Medicare Other | Admitting: Nurse Practitioner

## 2020-04-22 ENCOUNTER — Encounter: Payer: Self-pay | Admitting: Nurse Practitioner

## 2020-04-22 DIAGNOSIS — K5901 Slow transit constipation: Secondary | ICD-10-CM | POA: Diagnosis not present

## 2020-04-22 DIAGNOSIS — N3281 Overactive bladder: Secondary | ICD-10-CM

## 2020-04-22 DIAGNOSIS — M159 Polyosteoarthritis, unspecified: Secondary | ICD-10-CM

## 2020-04-22 DIAGNOSIS — F0390 Unspecified dementia without behavioral disturbance: Secondary | ICD-10-CM | POA: Diagnosis not present

## 2020-04-22 DIAGNOSIS — M8949 Other hypertrophic osteoarthropathy, multiple sites: Secondary | ICD-10-CM

## 2020-04-22 DIAGNOSIS — K257 Chronic gastric ulcer without hemorrhage or perforation: Secondary | ICD-10-CM | POA: Diagnosis not present

## 2020-04-22 NOTE — Assessment & Plan Note (Signed)
Functioning well in SN[facility], continue Memantine, Donepezil.

## 2020-04-22 NOTE — Assessment & Plan Note (Signed)
Stable, continue Pantoprazole.  

## 2020-04-22 NOTE — Assessment & Plan Note (Signed)
Stable, continue Bisacodyl.

## 2020-04-22 NOTE — Assessment & Plan Note (Signed)
No significant change, continue Myrbetriq

## 2020-04-22 NOTE — Assessment & Plan Note (Signed)
Multiple sites, pain/aches is controlled, continue Tylenol, Norco

## 2020-04-22 NOTE — Progress Notes (Signed)
Location:   [facility] Room Number: 10 Place of Service:  SNF (31) Provider: Marlana Latus NP  Anyiah Coverdale X, NP  Patient Care Team: Halim Surrette X, NP as PCP - General (Internal Medicine) [name] Jorgia Manthei X, NP as Nurse Practitioner (Nurse Practitioner) [name], MD as Attending Physician (Urology) [name], MD as Consulting Physician (Gastroenterology)  Extended Emergency Contact Information Primary Emergency Contact: Hinton,[name] Address: [place] [address] [name] of [place] Phone: [phone] Relation: Son  Code Status:  DNR Goals of care: Advanced Directive information Advanced Directives 04/22/2020  Does Patient Have a Medical Advance Directive? Yes  Type of Advance Directive Out of facility DNR (pink MOST or yellow form);Living will  Does patient want to make changes to medical advance directive? No - Patient declined  Copy of [place] in Chart? -  Pre-existing out of facility DNR order (yellow form or pink MOST form) Yellow form placed in chart (order not valid for inpatient use)     Chief Complaint  Patient presents with  . Medical Management of Chronic Issues    HPI:  Pt is a 86 y.o. female seen today for medical management of chronic diseases.     Dementia, on Memantine 10mg  bid, Donepezil 5mg  qd for memory.   Hx of OA, stable, on Norco 5/325mg  bid, Tylenol 500mg  bid.  GERD/PUD, stable, on Pantoprazole 40mg  bid.   Urinary frequency, managed on Myrbetriq 50mg  qd.  Constipation, stable, on prn Bisacodyl 5mg .    Past Medical History:  Diagnosis Date  . Allergic rhinitis due to pollen 07/01/2009  . Anemia, unspecified 09/07/2010  . Arthritis   . Benign paroxysmal positional vertigo   . Candidiasis 01/01/2012  . Cramp of limb 01/11/2011  . Disorder of bone and cartilage, unspecified 11/10/2010  . Dizziness and giddiness 11/10/2010  . Insomnia, unspecified  09/07/2010  . Knee pain, left anterior   . Lumbago 07/01/2009  . Lump or mass in breast 08/01/2012  . Memory loss 01/22/2012  . Myalgia and myositis, unspecified 05/17/2011  . Myopathy, unspecified   . Osteoarthrosis, unspecified whether generalized or localized, lower leg   . Other abnormal glucose 03/23/2010  . Other chest pain 08/28/2012  . Other disorders of bone and cartilage(733.99) 09/19/2012  . Overactive bladder   . Rash    inner legs- ? med allergy- to see PCP if doesnt improve by end of week  . Rash and nonspecific skin eruption 10/28/2014  . Right bundle branch block   . Seborrheic keratoses 05/17/2011  . Spasm of muscle 01/11/2011  . Spinal stenosis, lumbar region, without neurogenic claudication   . Stress incontinence 07/08/2014  . Unspecified constipation 12/18/2011  . Unspecified tinnitus   . Unspecified urinary incontinence   . Urgency of urination   . Urinary frequency 09/18/2012   Past Surgical History:  Procedure Laterality Date  . ABDOMINAL HYSTERECTOMY    . APPENDECTOMY  1956  . BACK SURGERY     lumbar-- ? type  Dr Tonita Cong  . BREAST LUMPECTOMY  1993   breast cyst  . BREAST SURGERY     right lumpectomy  . CLEFT LIP REPAIR     with repair palate  . ESOPHAGOGASTRODUODENOSCOPY Left 03/10/2013   Procedure: ESOPHAGOGASTRODUODENOSCOPY (EGD);  Surgeon: Beryle Beams, MD;  Location: [facility];  Service: Endoscopy;  Laterality: Left;  . EYE SURGERY     bilateral  cataract extraction with IOL  . TOTAL KNEE ARTHROPLASTY  12/13/2011   Procedure: TOTAL KNEE ARTHROPLASTY;  Surgeon: Johnn Hai, MD;  Location: [facility];  Service: Orthopedics;  Laterality: Left;    No Known Allergies  Allergies as of 04/22/2020   No Known Allergies     Medication List       Accurate as of April 22, 2020  4:24 PM. If you have any questions, ask your nurse or doctor.        acetaminophen 500 MG tablet Commonly known as: TYLENOL Take 500 mg by mouth 2 (two) times daily.   bisacodyl  5 MG EC tablet Commonly known as: DULCOLAX Take 5 mg by mouth daily as needed.   donepezil 5 MG tablet Commonly known as: ARICEPT Take 5 mg by mouth at bedtime.   HYDROcodone-acetaminophen 5-325 MG tablet Commonly known as: NORCO/VICODIN Take 1 tablet by mouth 2 (two) times daily. For back pain   Lidocaine 4 % Ptch Apply 4 % topically daily. Remove Lidocaine Patch every evening from left knee. 12 hours on 12 hours off   memantine 10 MG tablet Commonly known as: NAMENDA Take 10 mg by mouth 2 (two) times daily.   Myrbetriq 50 MG Tb24 tablet Generic drug: mirabegron ER Take 50 mg by mouth daily.   pantoprazole 40 MG tablet Commonly known as: Protonix Take 1 tablet (40 mg total) by mouth 2 (two) times daily.       Review of Systems  Constitutional: Negative for activity change, fever and unexpected weight change.  HENT: Positive for hearing loss. Negative for congestion and voice change.   Eyes: Negative for visual disturbance.  Respiratory: Negative for cough.   Cardiovascular: Negative for leg swelling.  Gastrointestinal: Negative for abdominal pain and constipation.  Genitourinary: Positive for frequency. Negative for difficulty urinating, dysuria and urgency.  Musculoskeletal: Positive for arthralgias, back pain and gait problem.  Skin: Negative for color change.  Neurological: Negative for dizziness, speech difficulty and headaches.       Dementia  Psychiatric/Behavioral: Positive for confusion. Negative for behavioral problems and sleep disturbance. The patient is not nervous/anxious.     Immunization History  Administered Date(s) Administered  . Influenza Whole 05/27/2012, 06/10/2013, 06/06/2018  . Influenza, High Dose Seasonal PF 05/30/2019  . Influenza-Unspecified 06/14/2014, 05/26/2015, 06/13/2016, 06/17/2017  . Moderna SARS-COVID-2 Vaccination 08/29/2019, 09/26/2019  . Pneumococcal Conjugate-13 06/24/2017  . Pneumococcal Polysaccharide-23 10/03/2011  . Td  10/03/2011  . Zoster 08/28/2007   Pertinent  Health Maintenance Due  Topic Date Due  . INFLUENZA VACCINE  03/27/2020  . DEXA SCAN  Completed  . PNA vac Low Risk Adult  Completed   Fall Risk  03/27/2019 05/17/2017 09/15/2015 01/21/2014  Falls in the past year? (No Data) No No No  Comment Emmi Telephone Survey: data to providers prior to load - - -  Number falls in past yr: (No Data) - - -  Comment Emmi Telephone Survey Actual Response =  - - -   Functional Status Survey:    Vitals:   04/22/20 1535  BP: 122/90  Pulse: 64  Resp: 18  Temp: (!) 97.5 F (36.4 C)  SpO2: 97%  Weight: 180 lb 12.8 oz (82 kg)  Height: 5\' 2"  (1.575 m)   Body mass index is 33.07 kg/m. Physical Exam Vitals and nursing note reviewed.  Constitutional:      Appearance: Normal appearance.  HENT:     Head: Normocephalic and atraumatic.     Mouth/Throat:  Mouth: Mucous membranes are moist.  Eyes:     Extraocular Movements: Extraocular movements intact.     Conjunctiva/sclera:     Right eye: Right conjunctiva is not injected.     Left eye: Left conjunctiva is not injected.     Pupils: Pupils are equal, round, and reactive to light.  Cardiovascular:     Rate and Rhythm: Normal rate and regular rhythm.     Heart sounds: No murmur heard.   Pulmonary:     Breath sounds: No rales.  Abdominal:     General: Bowel sounds are normal.     Palpations: Abdomen is soft.     Tenderness: There is no abdominal tenderness.  Musculoskeletal:     Cervical back: Normal range of motion and neck supple.     Right lower leg: No edema.     Left lower leg: No edema.  Skin:    General: Skin is warm and dry.  Neurological:     General: No focal deficit present.     Mental Status: She is alert. Mental status is at baseline.     Gait: Gait abnormal.     Comments: Oriented to self, her room  Psychiatric:        Mood and Affect: Mood normal.        Behavior: Behavior normal.     Labs reviewed: Recent Labs     07/21/19 0000 09/10/19 0000 02/11/20 0000  NA 140 142 142  K 4.7 4.3 4.1  CL 106 108 107  CO2 26* 25* 27*  BUN 18 20 18   CREATININE 0.9 0.8 0.8  CALCIUM 9.4 9.4 9.1   Recent Labs    07/21/19 0000 09/10/19 0000 02/11/20 0000  AST 16 16 12*  ALT 14 23 14   ALKPHOS 119 115 118  ALBUMIN 4.0 3.7 4.0   Recent Labs    07/21/19 0000 09/10/19 0000 02/11/20 0000  WBC 7.9 7.0 6.4  NEUTROABS 4,866 4,256 3,802  HGB 14.9 14.2 14.6  HCT 45 43 44  PLT 297 295 235   Lab Results  Component Value Date   TSH 1.59 09/10/2019   Lab Results  Component Value Date   HGBA1C 5.6 04/24/2016   Lab Results  Component Value Date   CHOL 189 09/06/2015   HDL 65 09/06/2015   LDLCALC 99 09/06/2015   TRIG 126 09/06/2015    Significant Diagnostic Results in last 30 days:  No results found.  Assessment/Plan  Gastric erosions Stable, continue Pantoprazole.   Slow transit constipation Stable, continue Bisacodyl.   Dementia without behavioral disturbance ([place]) Functioning well in SNF [facility], continue Memantine, Donepezil.   Osteoarthritis, multiple sites Multiple sites, pain/aches is controlled, continue Tylenol, Norco  Overactive bladder No significant change, continue Myrbetriq   Family/ staff Communication: plan of care reviewed with the patient and charge nurse.   Labs/tests ordered: none  Time spend 25 minutes.

## 2020-05-12 ENCOUNTER — Non-Acute Institutional Stay (SKILLED_NURSING_FACILITY): Payer: Medicare Other | Admitting: Internal Medicine

## 2020-05-12 ENCOUNTER — Encounter: Payer: Self-pay | Admitting: Internal Medicine

## 2020-05-12 DIAGNOSIS — K219 Gastro-esophageal reflux disease without esophagitis: Secondary | ICD-10-CM

## 2020-05-12 DIAGNOSIS — N3281 Overactive bladder: Secondary | ICD-10-CM | POA: Diagnosis not present

## 2020-05-12 DIAGNOSIS — M8949 Other hypertrophic osteoarthropathy, multiple sites: Secondary | ICD-10-CM

## 2020-05-12 DIAGNOSIS — F0390 Unspecified dementia without behavioral disturbance: Secondary | ICD-10-CM

## 2020-05-12 DIAGNOSIS — M159 Polyosteoarthritis, unspecified: Secondary | ICD-10-CM

## 2020-05-12 NOTE — Progress Notes (Signed)
Location:   [facility] Room Number: 50 Place of Service:  SNF (31) Provider:  Veleta Miners MD  Mast, Man X, NP  Patient Care Team: Mast, Man X, NP as PCP - General (Internal Medicine) [name] Mast, Man X, NP as Nurse Practitioner (Nurse Practitioner) [name], MD as Attending Physician (Urology) [name], MD as Consulting Physician (Gastroenterology)  Extended Emergency Contact Information Primary Emergency Contact: Okelley,[name] Address: [place] [address] [name] of [place] Phone: [phone] Relation: Son  Code Status:  DNR Goals of care: Advanced Directive information Advanced Directives 05/12/2020  Does Patient Have a Medical Advance Directive? Yes  Type of Advance Directive Out of facility DNR (pink MOST or yellow form);Living will;Healthcare Power of Attorney  Does patient want to make changes to medical advance directive? No - Patient declined  Copy of [place] in Chart? Yes - validated most recent copy scanned in chart (See row information)  Pre-existing out of facility DNR order (yellow form or pink MOST form) Yellow form placed in chart (order not valid for inpatient use)     Chief Complaint  Patient presents with  . Medical Management of Chronic Issues    HPI:  Pt is a 86 y.o. female seen today for medical management of chronic diseases.    She has h/o Lumbago, Stress incontinence, Vertigo, h/o Duodenal Ulcer and dementiaand Arthritis Long term resident of facility Walks with the walker No New Issues Has gained weight. No Nursing issues  Past Medical History:  Diagnosis Date  . Allergic rhinitis due to pollen 07/01/2009  . Anemia, unspecified 09/07/2010  . Arthritis   . Benign paroxysmal positional vertigo   . Candidiasis 01/01/2012  . Cramp of limb 01/11/2011  . Disorder of bone and cartilage, unspecified 11/10/2010  . Dizziness and giddiness  11/10/2010  . Insomnia, unspecified 09/07/2010  . Knee pain, left anterior   . Lumbago 07/01/2009  . Lump or mass in breast 08/01/2012  . Memory loss 01/22/2012  . Myalgia and myositis, unspecified 05/17/2011  . Myopathy, unspecified   . Osteoarthrosis, unspecified whether generalized or localized, lower leg   . Other abnormal glucose 03/23/2010  . Other chest pain 08/28/2012  . Other disorders of bone and cartilage(733.99) 09/19/2012  . Overactive bladder   . Rash    inner legs- ? med allergy- to see PCP if doesnt improve by end of week  . Rash and nonspecific skin eruption 10/28/2014  . Right bundle branch block   . Seborrheic keratoses 05/17/2011  . Spasm of muscle 01/11/2011  . Spinal stenosis, lumbar region, without neurogenic claudication   . Stress incontinence 07/08/2014  . Unspecified constipation 12/18/2011  . Unspecified tinnitus   . Unspecified urinary incontinence   . Urgency of urination   . Urinary frequency 09/18/2012   Past Surgical History:  Procedure Laterality Date  . ABDOMINAL HYSTERECTOMY    . APPENDECTOMY  1956  . BACK SURGERY     lumbar-- ? type  Dr Tonita Cong  . BREAST LUMPECTOMY  1993   breast cyst  . BREAST SURGERY     right lumpectomy  . CLEFT LIP REPAIR     with repair palate  . ESOPHAGOGASTRODUODENOSCOPY Left 03/10/2013   Procedure: ESOPHAGOGASTRODUODENOSCOPY (EGD);  Surgeon: Beryle Beams, MD;  Location: [facility];  Service: Endoscopy;  Laterality: Left;  . EYE SURGERY     bilateral cataract extraction with  IOL  . TOTAL KNEE ARTHROPLASTY  12/13/2011   Procedure: TOTAL KNEE ARTHROPLASTY;  Surgeon: Johnn Hai, MD;  Location: [facility];  Service: Orthopedics;  Laterality: Left;    No Known Allergies  Allergies as of 05/12/2020   No Known Allergies     Medication List       Accurate as of May 12, 2020 11:17 AM. If you have any questions, ask your nurse or doctor.        acetaminophen 500 MG tablet Commonly known as: TYLENOL Take 500 mg by  mouth 2 (two) times daily.   bisacodyl 5 MG EC tablet Commonly known as: DULCOLAX Take 5 mg by mouth daily as needed.   donepezil 5 MG tablet Commonly known as: ARICEPT Take 5 mg by mouth at bedtime.   HYDROcodone-acetaminophen 5-325 MG tablet Commonly known as: NORCO/VICODIN Take 1 tablet by mouth 2 (two) times daily. For back pain   Lidocaine 4 % Ptch Apply 4 % topically daily. Remove Lidocaine Patch every evening from left knee. 12 hours on 12 hours off   memantine 10 MG tablet Commonly known as: NAMENDA Take 10 mg by mouth 2 (two) times daily.   Myrbetriq 50 MG Tb24 tablet Generic drug: mirabegron ER Take 50 mg by mouth daily.   pantoprazole 40 MG tablet Commonly known as: Protonix Take 1 tablet (40 mg total) by mouth 2 (two) times daily.       Review of Systems  Unable to perform ROS: Dementia    Immunization History  Administered Date(s) Administered  . Influenza Whole 05/27/2012, 06/10/2013, 06/06/2018  . Influenza, High Dose Seasonal PF 05/30/2019  . Influenza-Unspecified 06/14/2014, 05/26/2015, 06/13/2016, 06/17/2017  . Moderna SARS-COVID-2 Vaccination 08/29/2019, 09/26/2019  . Pneumococcal Conjugate-13 06/24/2017  . Pneumococcal Polysaccharide-23 10/03/2011  . Td 10/03/2011  . Zoster 08/28/2007   Pertinent  Health Maintenance Due  Topic Date Due  . INFLUENZA VACCINE  03/27/2020  . DEXA SCAN  Completed  . PNA vac Low Risk Adult  Completed   Fall Risk  03/27/2019 05/17/2017 09/15/2015 01/21/2014  Falls in the past year? (No Data) No No No  Comment [facility]i Telephone Survey: data to providers prior to load - - -  Number falls in past yr: (No Data) - - -  Comment Emmi Telephone Survey Actual Response =  - - -   Functional Status Survey:    Vitals:   05/12/20 1112  BP: 140/78  Pulse: 88  Resp: 18  Temp: 98.9 F (37.2 C)  SpO2: 94%  Weight: 181 lb 9.6 oz (82.4 kg)  Height: 5\' 2"  (1.575 m)   Body mass index is 33.22 kg/m. Physical Exam Vitals  reviewed.  Constitutional:      Appearance: Normal appearance.  HENT:     Head: Normocephalic.     Nose: Nose normal.     Mouth/Throat:     Mouth: Mucous membranes are moist.  Cardiovascular:     Rate and Rhythm: Normal rate and regular rhythm.     Pulses: Normal pulses.  Pulmonary:     Effort: Pulmonary effort is normal.     Breath sounds: Normal breath sounds.  Abdominal:     General: Abdomen is flat. Bowel sounds are normal.     Palpations: Abdomen is soft.  Musculoskeletal:     Cervical back: Neck supple.     Comments: Chronic Venous Changes  Skin:    General: Skin is warm.  Neurological:     General: No focal deficit present.  Mental Status: She is alert.     Comments: Walks with the walker Follows Commands but not Oriented  Psychiatric:        Mood and Affect: Mood normal.        Thought Content: Thought content normal.     Labs reviewed: Recent Labs    07/21/19 0000 09/10/19 0000 02/11/20 0000  NA 140 142 142  K 4.7 4.3 4.1  CL 106 108 107  CO2 26* 25* 27*  BUN 18 20 18   CREATININE 0.9 0.8 0.8  CALCIUM 9.4 9.4 9.1   Recent Labs    07/21/19 0000 09/10/19 0000 02/11/20 0000  AST 16 16 12*  ALT 14 23 14   ALKPHOS 119 115 118  ALBUMIN 4.0 3.7 4.0   Recent Labs    07/21/19 0000 09/10/19 0000 02/11/20 0000  WBC 7.9 7.0 6.4  NEUTROABS 4,866 4,256 3,802  HGB 14.9 14.2 14.6  HCT 45 43 44  PLT 297 295 235   Lab Results  Component Value Date   TSH 1.59 09/10/2019   Lab Results  Component Value Date   HGBA1C 5.6 04/24/2016   Lab Results  Component Value Date   CHOL 189 09/06/2015   HDL 65 09/06/2015   LDLCALC 99 09/06/2015   TRIG 126 09/06/2015    Significant Diagnostic Results in last 30 days:  No results found.  Assessment/Plan Primary osteoarthritis involving multiple joints Noroc BID Dementia without behavioral disturbance, Namenda and Aricept  Overactive bladder Myrebetriq Gastroesophageal reflux disease without  esophagitis On Protonix   Family/ staff Communication:   Labs/tests ordered:

## 2020-06-15 DIAGNOSIS — R29898 Other symptoms and signs involving the musculoskeletal system: Secondary | ICD-10-CM | POA: Diagnosis not present

## 2020-06-15 DIAGNOSIS — R2681 Unsteadiness on feet: Secondary | ICD-10-CM | POA: Diagnosis not present

## 2020-06-15 DIAGNOSIS — M25662 Stiffness of left knee, not elsewhere classified: Secondary | ICD-10-CM | POA: Diagnosis not present

## 2020-06-15 DIAGNOSIS — M25661 Stiffness of right knee, not elsewhere classified: Secondary | ICD-10-CM | POA: Diagnosis not present

## 2020-06-15 DIAGNOSIS — M6281 Muscle weakness (generalized): Secondary | ICD-10-CM | POA: Diagnosis not present

## 2020-06-15 DIAGNOSIS — Z9181 History of falling: Secondary | ICD-10-CM | POA: Diagnosis not present

## 2020-06-15 DIAGNOSIS — N3946 Mixed incontinence: Secondary | ICD-10-CM | POA: Diagnosis not present

## 2020-06-16 DIAGNOSIS — M25661 Stiffness of right knee, not elsewhere classified: Secondary | ICD-10-CM | POA: Diagnosis not present

## 2020-06-16 DIAGNOSIS — Z9181 History of falling: Secondary | ICD-10-CM | POA: Diagnosis not present

## 2020-06-16 DIAGNOSIS — M25662 Stiffness of left knee, not elsewhere classified: Secondary | ICD-10-CM | POA: Diagnosis not present

## 2020-06-16 DIAGNOSIS — M6281 Muscle weakness (generalized): Secondary | ICD-10-CM | POA: Diagnosis not present

## 2020-06-16 DIAGNOSIS — R2681 Unsteadiness on feet: Secondary | ICD-10-CM | POA: Diagnosis not present

## 2020-06-16 DIAGNOSIS — N3946 Mixed incontinence: Secondary | ICD-10-CM | POA: Diagnosis not present

## 2020-06-20 DIAGNOSIS — Z9181 History of falling: Secondary | ICD-10-CM | POA: Diagnosis not present

## 2020-06-20 DIAGNOSIS — R2681 Unsteadiness on feet: Secondary | ICD-10-CM | POA: Diagnosis not present

## 2020-06-20 DIAGNOSIS — M25661 Stiffness of right knee, not elsewhere classified: Secondary | ICD-10-CM | POA: Diagnosis not present

## 2020-06-20 DIAGNOSIS — M6281 Muscle weakness (generalized): Secondary | ICD-10-CM | POA: Diagnosis not present

## 2020-06-20 DIAGNOSIS — N3946 Mixed incontinence: Secondary | ICD-10-CM | POA: Diagnosis not present

## 2020-06-20 DIAGNOSIS — M25662 Stiffness of left knee, not elsewhere classified: Secondary | ICD-10-CM | POA: Diagnosis not present

## 2020-06-22 ENCOUNTER — Non-Acute Institutional Stay (SKILLED_NURSING_FACILITY): Payer: Medicare Other | Admitting: Nurse Practitioner

## 2020-06-22 ENCOUNTER — Encounter: Payer: Self-pay | Admitting: Nurse Practitioner

## 2020-06-22 DIAGNOSIS — R2681 Unsteadiness on feet: Secondary | ICD-10-CM | POA: Diagnosis not present

## 2020-06-22 DIAGNOSIS — F0390 Unspecified dementia without behavioral disturbance: Secondary | ICD-10-CM

## 2020-06-22 DIAGNOSIS — M8949 Other hypertrophic osteoarthropathy, multiple sites: Secondary | ICD-10-CM | POA: Diagnosis not present

## 2020-06-22 DIAGNOSIS — N3946 Mixed incontinence: Secondary | ICD-10-CM | POA: Diagnosis not present

## 2020-06-22 DIAGNOSIS — K5901 Slow transit constipation: Secondary | ICD-10-CM | POA: Diagnosis not present

## 2020-06-22 DIAGNOSIS — K269 Duodenal ulcer, unspecified as acute or chronic, without hemorrhage or perforation: Secondary | ICD-10-CM | POA: Diagnosis not present

## 2020-06-22 DIAGNOSIS — M25662 Stiffness of left knee, not elsewhere classified: Secondary | ICD-10-CM | POA: Diagnosis not present

## 2020-06-22 DIAGNOSIS — M25661 Stiffness of right knee, not elsewhere classified: Secondary | ICD-10-CM | POA: Diagnosis not present

## 2020-06-22 DIAGNOSIS — N3281 Overactive bladder: Secondary | ICD-10-CM | POA: Diagnosis not present

## 2020-06-22 DIAGNOSIS — M159 Polyosteoarthritis, unspecified: Secondary | ICD-10-CM

## 2020-06-22 DIAGNOSIS — Z9181 History of falling: Secondary | ICD-10-CM | POA: Diagnosis not present

## 2020-06-22 DIAGNOSIS — M6281 Muscle weakness (generalized): Secondary | ICD-10-CM | POA: Diagnosis not present

## 2020-06-22 NOTE — Progress Notes (Signed)
Location:   [place] Room Number: 67 Place of Service:  SNF (31) Provider: Taivon Haroon, Lennie Odor, NP  Chaunce Winkels X, NP  Patient Care Team: Mairi Stagliano X, NP as PCP - General (Internal Medicine) [name] Ashawnti Tangen X, NP as Nurse Practitioner (Nurse Practitioner) [name], MD as Attending Physician (Urology) [name], MD as Consulting Physician (Gastroenterology)  Extended Emergency Contact Information Primary Emergency Contact: Malenfant,[name] Address: [place] [address] [name] of [place] Phone: [phone] Relation: Son  Code Status:  DNR Goals of care: Advanced Directive information Advanced Directives 06/22/2020  Does Patient Have a Medical Advance Directive? Yes  Type of Paramedic of [place];Living will;Out of facility DNR (pink MOST or yellow form)  Does patient want to make changes to medical advance directive? No - Patient declined  Copy of [place] in Chart? No - copy requested  Pre-existing out of facility DNR order (yellow form or pink MOST form) Yellow form placed in chart (order not valid for inpatient use)     Chief Complaint  Patient presents with   Medical Management of Chronic Issues    Routine follow up visit.    HPI:  Pt is a 86 y.o. female seen today for medical management of chronic diseases.    Dementia,on Memantine 10mg  bid, Donepezil 5mg  qd for memory.              Hx of OA, stable, on Norco 5/325mg  bid, Tylenol 500mg  bid.  GERD/PUD, stable, on Pantoprazole 40mg  bid.              Urinary frequency, managed on Myrbetriq 50mg  qd.             Constipation, stable, on prn Bisacodyl 5mg .   Past Medical History:  Diagnosis Date   Allergic rhinitis due to pollen 07/01/2009   Anemia, unspecified 09/07/2010   Arthritis    Benign paroxysmal positional vertigo    Candidiasis 01/01/2012   Cramp of limb 01/11/2011    Disorder of bone and cartilage, unspecified 11/10/2010   Dizziness and giddiness 11/10/2010   Insomnia, unspecified 09/07/2010   Knee pain, left anterior    Lumbago 07/01/2009   Lump or mass in breast 08/01/2012   Memory loss 01/22/2012   Myalgia and myositis, unspecified 05/17/2011   Myopathy, unspecified    Osteoarthrosis, unspecified whether generalized or localized, lower leg    Other abnormal glucose 03/23/2010   Other chest pain 08/28/2012   Other disorders of bone and cartilage(733.99) 09/19/2012   Overactive bladder    Rash    inner legs- ? med allergy- to see PCP if doesnt improve by end of week   Rash and nonspecific skin eruption 10/28/2014   Right bundle branch block    Seborrheic keratoses 05/17/2011   Spasm of muscle 01/11/2011   Spinal stenosis, lumbar region, without neurogenic claudication    Stress incontinence 07/08/2014   Unspecified constipation 12/18/2011   Unspecified tinnitus    Unspecified urinary incontinence    Urgency of urination    Urinary frequency 09/18/2012   Past Surgical History:  Procedure Laterality Date   ABDOMINAL HYSTERECTOMY     APPENDECTOMY  1956   BACK SURGERY     lumbar-- ? type  Dr Tonita Cong   BREAST LUMPECTOMY  1993   breast cyst   BREAST SURGERY     right lumpectomy   CLEFT LIP  REPAIR     with repair palate   ESOPHAGOGASTRODUODENOSCOPY Left 03/10/2013   Procedure: ESOPHAGOGASTRODUODENOSCOPY (EGD);  Surgeon: Beryle Beams, MD;  Location: [facility];  Service: Endoscopy;  Laterality: Left;   EYE SURGERY     bilateral cataract extraction with IOL   TOTAL KNEE ARTHROPLASTY  12/13/2011   Procedure: TOTAL KNEE ARTHROPLASTY;  Surgeon: Johnn Hai, MD;  Location: [facility];  Service: Orthopedics;  Laterality: Left;    No Known Allergies  Allergies as of 06/22/2020   No Known Allergies     Medication List       Accurate as of June 22, 2020 11:59 PM. If you have any questions, ask your nurse or doctor.          acetaminophen 500 MG tablet Commonly known as: TYLENOL Take 500 mg by mouth 2 (two) times daily.   bisacodyl 5 MG EC tablet Commonly known as: DULCOLAX Take 5 mg by mouth daily as needed.   donepezil 5 MG tablet Commonly known as: ARICEPT Take 5 mg by mouth at bedtime.   HYDROcodone-acetaminophen 5-325 MG tablet Commonly known as: NORCO/VICODIN Take 1 tablet by mouth 2 (two) times daily. For back pain   Lidocaine 4 % Ptch Apply 4 % topically daily. Remove Lidocaine Patch every evening from left knee. 12 hours on 12 hours off   memantine 10 MG tablet Commonly known as: NAMENDA Take 10 mg by mouth 2 (two) times daily.   Myrbetriq 50 MG Tb24 tablet Generic drug: mirabegron ER Take 50 mg by mouth daily.   pantoprazole 40 MG tablet Commonly known as: Protonix Take 1 tablet (40 mg total) by mouth 2 (two) times daily.       Review of Systems  Constitutional: Negative for fatigue, fever and unexpected weight change.  HENT: Positive for hearing loss. Negative for congestion and voice change.   Eyes: Negative for visual disturbance.  Respiratory: Negative for cough.   Cardiovascular: Negative for leg swelling.  Gastrointestinal: Negative for abdominal pain and constipation.  Genitourinary: Positive for frequency. Negative for difficulty urinating, dysuria and urgency.  Musculoskeletal: Positive for arthralgias, back pain and gait problem.  Skin: Negative for color change.  Neurological: Negative for speech difficulty, light-headedness and headaches.       Dementia  Psychiatric/Behavioral: Positive for confusion. Negative for behavioral problems and sleep disturbance.    Immunization History  Administered Date(s) Administered   Influenza Whole 05/27/2012, 06/10/2013, 06/06/2018   Influenza, High Dose Seasonal PF 05/30/2019   Influenza-Unspecified 06/14/2014, 05/26/2015, 06/13/2016, 06/17/2017, 06/08/2020   Moderna SARS-COVID-2 Vaccination 08/29/2019, 09/26/2019    Pneumococcal Conjugate-13 06/24/2017   Pneumococcal Polysaccharide-23 10/03/2011   Td 10/03/2011   Zoster 08/28/2007   Pertinent  Health Maintenance Due  Topic Date Due   INFLUENZA VACCINE  Completed   DEXA SCAN  Completed   PNA vac Low Risk Adult  Completed   Fall Risk  03/27/2019 05/17/2017 09/15/2015 01/21/2014  Falls in the past year? (No Data) No No No  Comment Emmi Telephone Survey: data to providers prior to load - - -  Number falls in past yr: (No Data) - - -  Comment Emmi Telephone Survey Actual Response =  - - -   Functional Status Survey:    Vitals:   06/22/20 1026  BP: 120/90  Pulse: 60  Resp: 20  Temp: (!) 97.5 F (36.4 C)  SpO2: 93%  Weight: 182 lb 8 oz (82.8 kg)  Height: 5\' 2"  (1.575 m)   Body mass  index is 33.38 kg/m. Physical Exam Vitals and nursing note reviewed.  Constitutional:      Appearance: Normal appearance.  HENT:     Head: Normocephalic and atraumatic.     Mouth/Throat:     Mouth: Mucous membranes are moist.  Eyes:     Extraocular Movements: Extraocular movements intact.     Conjunctiva/sclera:     Right eye: Right conjunctiva is not injected.     Left eye: Left conjunctiva is not injected.     Pupils: Pupils are equal, round, and reactive to light.  Cardiovascular:     Rate and Rhythm: Normal rate and regular rhythm.     Heart sounds: No murmur heard.   Pulmonary:     Breath sounds: No rales.  Abdominal:     General: Bowel sounds are normal.     Palpations: Abdomen is soft.     Tenderness: There is no abdominal tenderness.  Musculoskeletal:     Cervical back: Normal range of motion and neck supple.     Right lower leg: No edema.     Left lower leg: No edema.  Skin:    General: Skin is warm and dry.  Neurological:     General: No focal deficit present.     Mental Status: She is alert. Mental status is at baseline.     Gait: Gait abnormal.     Comments: Oriented to self, her room  Psychiatric:        Mood and Affect:  Mood normal.        Behavior: Behavior normal.     Labs reviewed: Recent Labs    07/21/19 0000 09/10/19 0000 02/11/20 0000  NA 140 142 142  K 4.7 4.3 4.1  CL 106 108 107  CO2 26* 25* 27*  BUN 18 20 18   CREATININE 0.9 0.8 0.8  CALCIUM 9.4 9.4 9.1   Recent Labs    07/21/19 0000 09/10/19 0000 02/11/20 0000  AST 16 16 12*  ALT 14 23 14   ALKPHOS 119 115 118  ALBUMIN 4.0 3.7 4.0   Recent Labs    07/21/19 0000 09/10/19 0000 02/11/20 0000  WBC 7.9 7.0 6.4  NEUTROABS 4,866 4,256 3,802  HGB 14.9 14.2 14.6  HCT 45 43 44  PLT 297 295 235   Lab Results  Component Value Date   TSH 1.59 09/10/2019   Lab Results  Component Value Date   HGBA1C 5.6 04/24/2016   Lab Results  Component Value Date   CHOL 189 09/06/2015   HDL 65 09/06/2015   LDLCALC 99 09/06/2015   TRIG 126 09/06/2015    Significant Diagnostic Results in last 30 days:  No results found.  Assessment/Plan Dementia without behavioral disturbance (HCC) Dementia,on Memantine 10mg  bid, Donepezil 5mg  qd for memory.   Osteoarthritis, multiple sites Hx of OA, stable, on Norco 5/325mg  bid, Tylenol 500mg  bid.   Duodenal ulcer without hemorrhage or perforation GERD/PUD, stable, on Pantoprazole 40mg  bid.    Overactive bladder Urinary frequency, managed on Myrbetriq 50mg  qd.   Slow transit constipation Constipation, stable, on prn Bisacodyl 5mg .     Family/ staff Communication: plan of care reviewed with the patient and charge nurse.   Labs/tests ordered:  none  Time spend 35 minutes.

## 2020-06-23 ENCOUNTER — Encounter: Payer: Self-pay | Admitting: Nurse Practitioner

## 2020-06-23 DIAGNOSIS — Z9181 History of falling: Secondary | ICD-10-CM | POA: Diagnosis not present

## 2020-06-23 DIAGNOSIS — M25661 Stiffness of right knee, not elsewhere classified: Secondary | ICD-10-CM | POA: Diagnosis not present

## 2020-06-23 DIAGNOSIS — R2681 Unsteadiness on feet: Secondary | ICD-10-CM | POA: Diagnosis not present

## 2020-06-23 DIAGNOSIS — M6281 Muscle weakness (generalized): Secondary | ICD-10-CM | POA: Diagnosis not present

## 2020-06-23 DIAGNOSIS — M25662 Stiffness of left knee, not elsewhere classified: Secondary | ICD-10-CM | POA: Diagnosis not present

## 2020-06-23 DIAGNOSIS — N3946 Mixed incontinence: Secondary | ICD-10-CM | POA: Diagnosis not present

## 2020-06-23 NOTE — Assessment & Plan Note (Signed)
Constipation, stable, on prn Bisacodyl 5mg .

## 2020-06-23 NOTE — Assessment & Plan Note (Signed)
Hx of OA, stable, on Norco 5/325mg  bid, Tylenol 500mg  bid.

## 2020-06-23 NOTE — Assessment & Plan Note (Signed)
Urinary frequency, managed on Myrbetriq 50mg qd.  

## 2020-06-23 NOTE — Assessment & Plan Note (Signed)
GERD/PUD, stable, on Pantoprazole 40mg  bid.

## 2020-06-23 NOTE — Assessment & Plan Note (Signed)
Dementia,on Memantine 10mg  bid, Donepezil 5mg  qd for memory.

## 2020-06-24 DIAGNOSIS — M25661 Stiffness of right knee, not elsewhere classified: Secondary | ICD-10-CM | POA: Diagnosis not present

## 2020-06-24 DIAGNOSIS — Z9181 History of falling: Secondary | ICD-10-CM | POA: Diagnosis not present

## 2020-06-24 DIAGNOSIS — R2681 Unsteadiness on feet: Secondary | ICD-10-CM | POA: Diagnosis not present

## 2020-06-24 DIAGNOSIS — N3946 Mixed incontinence: Secondary | ICD-10-CM | POA: Diagnosis not present

## 2020-06-24 DIAGNOSIS — M6281 Muscle weakness (generalized): Secondary | ICD-10-CM | POA: Diagnosis not present

## 2020-06-24 DIAGNOSIS — M25662 Stiffness of left knee, not elsewhere classified: Secondary | ICD-10-CM | POA: Diagnosis not present

## 2020-06-27 ENCOUNTER — Encounter: Payer: Self-pay | Admitting: Nurse Practitioner

## 2020-06-27 DIAGNOSIS — Z9181 History of falling: Secondary | ICD-10-CM | POA: Diagnosis not present

## 2020-06-27 DIAGNOSIS — M25661 Stiffness of right knee, not elsewhere classified: Secondary | ICD-10-CM | POA: Diagnosis not present

## 2020-06-27 DIAGNOSIS — M25662 Stiffness of left knee, not elsewhere classified: Secondary | ICD-10-CM | POA: Diagnosis not present

## 2020-06-27 DIAGNOSIS — R2681 Unsteadiness on feet: Secondary | ICD-10-CM | POA: Diagnosis not present

## 2020-06-27 DIAGNOSIS — N3946 Mixed incontinence: Secondary | ICD-10-CM | POA: Diagnosis not present

## 2020-06-27 DIAGNOSIS — R29898 Other symptoms and signs involving the musculoskeletal system: Secondary | ICD-10-CM | POA: Diagnosis not present

## 2020-06-27 DIAGNOSIS — M6281 Muscle weakness (generalized): Secondary | ICD-10-CM | POA: Diagnosis not present

## 2020-06-28 ENCOUNTER — Non-Acute Institutional Stay (SKILLED_NURSING_FACILITY): Payer: Medicare Other | Admitting: Internal Medicine

## 2020-06-28 ENCOUNTER — Encounter: Payer: Self-pay | Admitting: Internal Medicine

## 2020-06-28 DIAGNOSIS — N3946 Mixed incontinence: Secondary | ICD-10-CM | POA: Diagnosis not present

## 2020-06-28 DIAGNOSIS — M25662 Stiffness of left knee, not elsewhere classified: Secondary | ICD-10-CM | POA: Diagnosis not present

## 2020-06-28 DIAGNOSIS — M8949 Other hypertrophic osteoarthropathy, multiple sites: Secondary | ICD-10-CM | POA: Diagnosis not present

## 2020-06-28 DIAGNOSIS — R2681 Unsteadiness on feet: Secondary | ICD-10-CM | POA: Diagnosis not present

## 2020-06-28 DIAGNOSIS — M25511 Pain in right shoulder: Secondary | ICD-10-CM | POA: Diagnosis not present

## 2020-06-28 DIAGNOSIS — M6281 Muscle weakness (generalized): Secondary | ICD-10-CM | POA: Diagnosis not present

## 2020-06-28 DIAGNOSIS — R29898 Other symptoms and signs involving the musculoskeletal system: Secondary | ICD-10-CM | POA: Diagnosis not present

## 2020-06-28 DIAGNOSIS — M159 Polyosteoarthritis, unspecified: Secondary | ICD-10-CM

## 2020-06-28 DIAGNOSIS — Z9181 History of falling: Secondary | ICD-10-CM | POA: Diagnosis not present

## 2020-06-28 NOTE — Progress Notes (Signed)
Location:   [place] Room Number: 97 Place of Service:  SNF (31) Provider: Veleta Miners, MD  Mast, Man X, NP  Patient Care Team: Mast, Man X, NP as PCP - General (Internal Medicine) [name] Mast, Man X, NP as Nurse Practitioner (Nurse Practitioner) [name], MD as Attending Physician (Urology) [name], MD as Consulting Physician (Gastroenterology)  Extended Emergency Contact Information Primary Emergency Contact: Cotrell,[name] Address: [place] [address] [name] of [place] Phone: [phone] Relation: Son  Code Status:  DNR Goals of care: Advanced Directive information Advanced Directives 06/28/2020  Does Patient Have a Medical Advance Directive? Yes  Type of Paramedic of [place];Living will;Out of facility DNR (pink MOST or yellow form)  Does patient want to make changes to medical advance directive? No - Patient declined  Copy of [place] in Chart? Yes - validated most recent copy scanned in chart (See row information)  Pre-existing out of facility DNR order (yellow form or pink MOST form) Yellow form placed in chart (order not valid for inpatient use)     Chief Complaint  Patient presents with  . Acute Visit    Right shoulder pain    HPI:  Pt is a 86 y.o. female seen today for an acute visit for Acute onset of Right Shoulder pain per Nurses Providing care  She has h/o Lumbago, Stress incontinence, Vertigo, h/o Duodenal Ulcer and dementiaand Arthritis Long term resident of facility Walks with the walker  Patient unable to give history due to her dementia.  By the nurses providing care they noticed that she was complaining of pain in her right shoulder. No history of injury or fall. Patient continues to walk with her walker. She denies any pain to me   Past Medical History:  Diagnosis Date  . Allergic rhinitis due to pollen  07/01/2009  . Anemia, unspecified 09/07/2010  . Arthritis   . Benign paroxysmal positional vertigo   . Candidiasis 01/01/2012  . Cramp of limb 01/11/2011  . Disorder of bone and cartilage, unspecified 11/10/2010  . Dizziness and giddiness 11/10/2010  . Insomnia, unspecified 09/07/2010  . Knee pain, left anterior   . Lumbago 07/01/2009  . Lump or mass in breast 08/01/2012  . Memory loss 01/22/2012  . Myalgia and myositis, unspecified 05/17/2011  . Myopathy, unspecified   . Osteoarthrosis, unspecified whether generalized or localized, lower leg   . Other abnormal glucose 03/23/2010  . Other chest pain 08/28/2012  . Other disorders of bone and cartilage(733.99) 09/19/2012  . Overactive bladder   . Rash    inner legs- ? med allergy- to see PCP if doesnt improve by end of week  . Rash and nonspecific skin eruption 10/28/2014  . Right bundle branch block   . Seborrheic keratoses 05/17/2011  . Spasm of muscle 01/11/2011  . Spinal stenosis, lumbar region, without neurogenic claudication   . Stress incontinence 07/08/2014  . Unspecified constipation 12/18/2011  . Unspecified tinnitus   . Unspecified urinary incontinence   . Urgency of urination   . Urinary frequency 09/18/2012   Past Surgical History:  Procedure Laterality Date  . ABDOMINAL HYSTERECTOMY    . APPENDECTOMY  1956  . BACK SURGERY     lumbar-- ? type  Dr Tonita Cong  . BREAST LUMPECTOMY  1993   breast cyst  . BREAST SURGERY     right lumpectomy  .  CLEFT LIP REPAIR     with repair palate  . ESOPHAGOGASTRODUODENOSCOPY Left 03/10/2013   Procedure: ESOPHAGOGASTRODUODENOSCOPY (EGD);  Surgeon: Beryle Beams, MD;  Location: [facility];  Service: Endoscopy;  Laterality: Left;  . EYE SURGERY     bilateral cataract extraction with IOL  . TOTAL KNEE ARTHROPLASTY  12/13/2011   Procedure: TOTAL KNEE ARTHROPLASTY;  Surgeon: Johnn Hai, MD;  Location: [facility];  Service: Orthopedics;  Laterality: Left;    No Known Allergies  Allergies as of  06/28/2020   No Known Allergies     Medication List       Accurate as of June 28, 2020 11:10 AM. If you have any questions, ask your nurse or doctor.        acetaminophen 500 MG tablet Commonly known as: TYLENOL Take 500 mg by mouth 2 (two) times daily.   bisacodyl 5 MG EC tablet Commonly known as: DULCOLAX Take 5 mg by mouth daily as needed.   donepezil 5 MG tablet Commonly known as: ARICEPT Take 5 mg by mouth at bedtime.   HYDROcodone-acetaminophen 5-325 MG tablet Commonly known as: NORCO/VICODIN Take 1 tablet by mouth 2 (two) times daily. For back pain   Lidocaine 4 % Ptch Apply 4 % topically daily. Remove Lidocaine Patch every evening from left knee. 12 hours on 12 hours off   memantine 10 MG tablet Commonly known as: NAMENDA Take 10 mg by mouth 2 (two) times daily.   Myrbetriq 50 MG Tb24 tablet Generic drug: mirabegron ER Take 50 mg by mouth daily.   pantoprazole 40 MG tablet Commonly known as: Protonix Take 1 tablet (40 mg total) by mouth 2 (two) times daily.       Review of Systems  Unable to perform ROS: Dementia    Immunization History  Administered Date(s) Administered  . Influenza Whole 05/27/2012, 06/10/2013, 06/06/2018  . Influenza, High Dose Seasonal PF 05/30/2019  . Influenza-Unspecified 06/14/2014, 05/26/2015, 06/13/2016, 06/17/2017, 06/08/2020  . Moderna SARS-COVID-2 Vaccination 08/29/2019, 09/26/2019  . Pneumococcal Conjugate-13 06/24/2017  . Pneumococcal Polysaccharide-23 10/03/2011  . Td 10/03/2011  . Zoster 08/28/2007   Pertinent  Health Maintenance Due  Topic Date Due  . INFLUENZA VACCINE  Completed  . DEXA SCAN  Completed  . PNA vac Low Risk Adult  Completed   Fall Risk  03/27/2019 05/17/2017 09/15/2015 01/21/2014  Falls in the past year? (No Data) No No No  Comment Emmi Telephone Survey: data to providers prior to load - - -  Number falls in past yr: (No Data) - - -  Comment Emmi Telephone Survey Actual Response =  - - -    Functional Status Survey:    Vitals:   06/28/20 1058  BP: 122/78  Pulse: 78  Resp: 20  Temp: (!) 97.3 F (36.3 C)  SpO2: 92%  Weight: 182 lb 8 oz (82.8 kg)  Height: 5\' 2"  (1.575 m)   Body mass index is 33.38 kg/m. Physical Exam Vitals reviewed.  Constitutional:      Appearance: Normal appearance.  HENT:     Head: Normocephalic.     Nose: Nose normal.     Mouth/Throat:     Mouth: Mucous membranes are moist.     Pharynx: Oropharynx is clear.  Eyes:     Pupils: Pupils are equal, round, and reactive to light.  Cardiovascular:     Rate and Rhythm: Normal rate.     Pulses: Normal pulses.  Pulmonary:     Effort: Pulmonary effort is  normal.     Breath sounds: Normal breath sounds.  Abdominal:     General: Abdomen is flat. Bowel sounds are normal.     Palpations: Abdomen is soft.  Musculoskeletal:     Cervical back: Neck supple.     Comments: Both shoulders were examined.  Patient had no pain with abduction or Adduction  With easily able to take it above her shoulder.  No swelling no crepitis  Skin:    General: Skin is warm and dry.  Neurological:     General: No focal deficit present.     Mental Status: She is alert.  Psychiatric:        Mood and Affect: Mood normal.        Thought Content: Thought content normal.     Labs reviewed: Recent Labs    07/21/19 0000 09/10/19 0000 02/11/20 0000  NA 140 142 142  K 4.7 4.3 4.1  CL 106 108 107  CO2 26* 25* 27*  BUN 18 20 18   CREATININE 0.9 0.8 0.8  CALCIUM 9.4 9.4 9.1   Recent Labs    07/21/19 0000 09/10/19 0000 02/11/20 0000  AST 16 16 12*  ALT 14 23 14   ALKPHOS 119 115 118  ALBUMIN 4.0 3.7 4.0   Recent Labs    07/21/19 0000 09/10/19 0000 02/11/20 0000  WBC 7.9 7.0 6.4  NEUTROABS 4,866 4,256 3,802  HGB 14.9 14.2 14.6  HCT 45 43 44  PLT 297 295 235   Lab Results  Component Value Date   TSH 1.59 09/10/2019   Lab Results  Component Value Date   HGBA1C 5.6 04/24/2016   Lab Results   Component Value Date   CHOL 189 09/06/2015   HDL 65 09/06/2015   LDLCALC 99 09/06/2015   TRIG 126 09/06/2015    Significant Diagnostic Results in last 30 days:  No results found.  Assessment/Plan Acute pain of right shoulder Resolved today Exam is benign Will Use Tylenol pRN If pain continues consider imaging  Primary osteoarthritis involving multiple joints Tylenol and Norco PRN    Family/ staff Communication:  Labs/tests ordered:

## 2020-06-29 DIAGNOSIS — R29898 Other symptoms and signs involving the musculoskeletal system: Secondary | ICD-10-CM | POA: Diagnosis not present

## 2020-06-29 DIAGNOSIS — M25662 Stiffness of left knee, not elsewhere classified: Secondary | ICD-10-CM | POA: Diagnosis not present

## 2020-06-29 DIAGNOSIS — Z9181 History of falling: Secondary | ICD-10-CM | POA: Diagnosis not present

## 2020-06-29 DIAGNOSIS — M6281 Muscle weakness (generalized): Secondary | ICD-10-CM | POA: Diagnosis not present

## 2020-06-29 DIAGNOSIS — R2681 Unsteadiness on feet: Secondary | ICD-10-CM | POA: Diagnosis not present

## 2020-06-29 DIAGNOSIS — N3946 Mixed incontinence: Secondary | ICD-10-CM | POA: Diagnosis not present

## 2020-06-30 DIAGNOSIS — M25662 Stiffness of left knee, not elsewhere classified: Secondary | ICD-10-CM | POA: Diagnosis not present

## 2020-06-30 DIAGNOSIS — R2681 Unsteadiness on feet: Secondary | ICD-10-CM | POA: Diagnosis not present

## 2020-06-30 DIAGNOSIS — R29898 Other symptoms and signs involving the musculoskeletal system: Secondary | ICD-10-CM | POA: Diagnosis not present

## 2020-06-30 DIAGNOSIS — M6281 Muscle weakness (generalized): Secondary | ICD-10-CM | POA: Diagnosis not present

## 2020-06-30 DIAGNOSIS — Z9181 History of falling: Secondary | ICD-10-CM | POA: Diagnosis not present

## 2020-06-30 DIAGNOSIS — N3946 Mixed incontinence: Secondary | ICD-10-CM | POA: Diagnosis not present

## 2020-07-04 ENCOUNTER — Other Ambulatory Visit: Payer: Self-pay

## 2020-07-04 DIAGNOSIS — Z9181 History of falling: Secondary | ICD-10-CM | POA: Diagnosis not present

## 2020-07-04 DIAGNOSIS — M6281 Muscle weakness (generalized): Secondary | ICD-10-CM | POA: Diagnosis not present

## 2020-07-04 DIAGNOSIS — R2681 Unsteadiness on feet: Secondary | ICD-10-CM | POA: Diagnosis not present

## 2020-07-04 DIAGNOSIS — M25662 Stiffness of left knee, not elsewhere classified: Secondary | ICD-10-CM | POA: Diagnosis not present

## 2020-07-04 DIAGNOSIS — M8949 Other hypertrophic osteoarthropathy, multiple sites: Secondary | ICD-10-CM

## 2020-07-04 DIAGNOSIS — M159 Polyosteoarthritis, unspecified: Secondary | ICD-10-CM

## 2020-07-04 DIAGNOSIS — R29898 Other symptoms and signs involving the musculoskeletal system: Secondary | ICD-10-CM | POA: Diagnosis not present

## 2020-07-04 DIAGNOSIS — N3946 Mixed incontinence: Secondary | ICD-10-CM | POA: Diagnosis not present

## 2020-07-04 MED ORDER — HYDROCODONE-ACETAMINOPHEN 5-325 MG PO TABS: 1.0000 | ORAL_TABLET | Freq: Two times a day (BID) | ORAL | 0 refills | Status: DC

## 2020-07-04 NOTE — Telephone Encounter (Signed)
Refill request received from [facility] for Hydrocodone (Norco) 5/325 mg tablet one twice a day. Medication pended and sent to Man X mast, NP for approval

## 2020-07-05 DIAGNOSIS — M25662 Stiffness of left knee, not elsewhere classified: Secondary | ICD-10-CM | POA: Diagnosis not present

## 2020-07-05 DIAGNOSIS — N3946 Mixed incontinence: Secondary | ICD-10-CM | POA: Diagnosis not present

## 2020-07-05 DIAGNOSIS — R2681 Unsteadiness on feet: Secondary | ICD-10-CM | POA: Diagnosis not present

## 2020-07-05 DIAGNOSIS — Z9181 History of falling: Secondary | ICD-10-CM | POA: Diagnosis not present

## 2020-07-05 DIAGNOSIS — Z23 Encounter for immunization: Secondary | ICD-10-CM | POA: Diagnosis not present

## 2020-07-05 DIAGNOSIS — R29898 Other symptoms and signs involving the musculoskeletal system: Secondary | ICD-10-CM | POA: Diagnosis not present

## 2020-07-05 DIAGNOSIS — M6281 Muscle weakness (generalized): Secondary | ICD-10-CM | POA: Diagnosis not present

## 2020-07-06 DIAGNOSIS — M6281 Muscle weakness (generalized): Secondary | ICD-10-CM | POA: Diagnosis not present

## 2020-07-06 DIAGNOSIS — R29898 Other symptoms and signs involving the musculoskeletal system: Secondary | ICD-10-CM | POA: Diagnosis not present

## 2020-07-06 DIAGNOSIS — R2681 Unsteadiness on feet: Secondary | ICD-10-CM | POA: Diagnosis not present

## 2020-07-06 DIAGNOSIS — Z9181 History of falling: Secondary | ICD-10-CM | POA: Diagnosis not present

## 2020-07-06 DIAGNOSIS — N3946 Mixed incontinence: Secondary | ICD-10-CM | POA: Diagnosis not present

## 2020-07-06 DIAGNOSIS — M25662 Stiffness of left knee, not elsewhere classified: Secondary | ICD-10-CM | POA: Diagnosis not present

## 2020-07-07 DIAGNOSIS — R29898 Other symptoms and signs involving the musculoskeletal system: Secondary | ICD-10-CM | POA: Diagnosis not present

## 2020-07-07 DIAGNOSIS — Z9181 History of falling: Secondary | ICD-10-CM | POA: Diagnosis not present

## 2020-07-07 DIAGNOSIS — R2681 Unsteadiness on feet: Secondary | ICD-10-CM | POA: Diagnosis not present

## 2020-07-07 DIAGNOSIS — M6281 Muscle weakness (generalized): Secondary | ICD-10-CM | POA: Diagnosis not present

## 2020-07-07 DIAGNOSIS — N3946 Mixed incontinence: Secondary | ICD-10-CM | POA: Diagnosis not present

## 2020-07-07 DIAGNOSIS — M25662 Stiffness of left knee, not elsewhere classified: Secondary | ICD-10-CM | POA: Diagnosis not present

## 2020-07-11 DIAGNOSIS — Z9181 History of falling: Secondary | ICD-10-CM | POA: Diagnosis not present

## 2020-07-11 DIAGNOSIS — N3946 Mixed incontinence: Secondary | ICD-10-CM | POA: Diagnosis not present

## 2020-07-11 DIAGNOSIS — R29898 Other symptoms and signs involving the musculoskeletal system: Secondary | ICD-10-CM | POA: Diagnosis not present

## 2020-07-11 DIAGNOSIS — M6281 Muscle weakness (generalized): Secondary | ICD-10-CM | POA: Diagnosis not present

## 2020-07-11 DIAGNOSIS — M25662 Stiffness of left knee, not elsewhere classified: Secondary | ICD-10-CM | POA: Diagnosis not present

## 2020-07-11 DIAGNOSIS — R2681 Unsteadiness on feet: Secondary | ICD-10-CM | POA: Diagnosis not present

## 2020-07-12 DIAGNOSIS — R29898 Other symptoms and signs involving the musculoskeletal system: Secondary | ICD-10-CM | POA: Diagnosis not present

## 2020-07-12 DIAGNOSIS — M25662 Stiffness of left knee, not elsewhere classified: Secondary | ICD-10-CM | POA: Diagnosis not present

## 2020-07-12 DIAGNOSIS — Z9181 History of falling: Secondary | ICD-10-CM | POA: Diagnosis not present

## 2020-07-12 DIAGNOSIS — N3946 Mixed incontinence: Secondary | ICD-10-CM | POA: Diagnosis not present

## 2020-07-12 DIAGNOSIS — R2681 Unsteadiness on feet: Secondary | ICD-10-CM | POA: Diagnosis not present

## 2020-07-12 DIAGNOSIS — M6281 Muscle weakness (generalized): Secondary | ICD-10-CM | POA: Diagnosis not present

## 2020-07-13 DIAGNOSIS — M25662 Stiffness of left knee, not elsewhere classified: Secondary | ICD-10-CM | POA: Diagnosis not present

## 2020-07-13 DIAGNOSIS — Z9181 History of falling: Secondary | ICD-10-CM | POA: Diagnosis not present

## 2020-07-13 DIAGNOSIS — R29898 Other symptoms and signs involving the musculoskeletal system: Secondary | ICD-10-CM | POA: Diagnosis not present

## 2020-07-13 DIAGNOSIS — N3946 Mixed incontinence: Secondary | ICD-10-CM | POA: Diagnosis not present

## 2020-07-13 DIAGNOSIS — R2681 Unsteadiness on feet: Secondary | ICD-10-CM | POA: Diagnosis not present

## 2020-07-13 DIAGNOSIS — M6281 Muscle weakness (generalized): Secondary | ICD-10-CM | POA: Diagnosis not present

## 2020-07-14 DIAGNOSIS — R2681 Unsteadiness on feet: Secondary | ICD-10-CM | POA: Diagnosis not present

## 2020-07-14 DIAGNOSIS — N3946 Mixed incontinence: Secondary | ICD-10-CM | POA: Diagnosis not present

## 2020-07-14 DIAGNOSIS — M25662 Stiffness of left knee, not elsewhere classified: Secondary | ICD-10-CM | POA: Diagnosis not present

## 2020-07-14 DIAGNOSIS — M6281 Muscle weakness (generalized): Secondary | ICD-10-CM | POA: Diagnosis not present

## 2020-07-14 DIAGNOSIS — Z9181 History of falling: Secondary | ICD-10-CM | POA: Diagnosis not present

## 2020-07-14 DIAGNOSIS — R29898 Other symptoms and signs involving the musculoskeletal system: Secondary | ICD-10-CM | POA: Diagnosis not present

## 2020-07-16 DIAGNOSIS — R29898 Other symptoms and signs involving the musculoskeletal system: Secondary | ICD-10-CM | POA: Diagnosis not present

## 2020-07-16 DIAGNOSIS — M25662 Stiffness of left knee, not elsewhere classified: Secondary | ICD-10-CM | POA: Diagnosis not present

## 2020-07-16 DIAGNOSIS — M6281 Muscle weakness (generalized): Secondary | ICD-10-CM | POA: Diagnosis not present

## 2020-07-16 DIAGNOSIS — R2681 Unsteadiness on feet: Secondary | ICD-10-CM | POA: Diagnosis not present

## 2020-07-16 DIAGNOSIS — Z9181 History of falling: Secondary | ICD-10-CM | POA: Diagnosis not present

## 2020-07-16 DIAGNOSIS — N3946 Mixed incontinence: Secondary | ICD-10-CM | POA: Diagnosis not present

## 2020-07-17 DIAGNOSIS — R29898 Other symptoms and signs involving the musculoskeletal system: Secondary | ICD-10-CM | POA: Diagnosis not present

## 2020-07-17 DIAGNOSIS — M6281 Muscle weakness (generalized): Secondary | ICD-10-CM | POA: Diagnosis not present

## 2020-07-17 DIAGNOSIS — Z9181 History of falling: Secondary | ICD-10-CM | POA: Diagnosis not present

## 2020-07-17 DIAGNOSIS — N3946 Mixed incontinence: Secondary | ICD-10-CM | POA: Diagnosis not present

## 2020-07-17 DIAGNOSIS — M25662 Stiffness of left knee, not elsewhere classified: Secondary | ICD-10-CM | POA: Diagnosis not present

## 2020-07-17 DIAGNOSIS — R2681 Unsteadiness on feet: Secondary | ICD-10-CM | POA: Diagnosis not present

## 2020-07-19 DIAGNOSIS — R29898 Other symptoms and signs involving the musculoskeletal system: Secondary | ICD-10-CM | POA: Diagnosis not present

## 2020-07-19 DIAGNOSIS — M25662 Stiffness of left knee, not elsewhere classified: Secondary | ICD-10-CM | POA: Diagnosis not present

## 2020-07-19 DIAGNOSIS — R2681 Unsteadiness on feet: Secondary | ICD-10-CM | POA: Diagnosis not present

## 2020-07-19 DIAGNOSIS — M6281 Muscle weakness (generalized): Secondary | ICD-10-CM | POA: Diagnosis not present

## 2020-07-19 DIAGNOSIS — N3946 Mixed incontinence: Secondary | ICD-10-CM | POA: Diagnosis not present

## 2020-07-19 DIAGNOSIS — Z9181 History of falling: Secondary | ICD-10-CM | POA: Diagnosis not present

## 2020-07-20 ENCOUNTER — Encounter: Payer: Self-pay | Admitting: Nurse Practitioner

## 2020-07-20 ENCOUNTER — Non-Acute Institutional Stay (SKILLED_NURSING_FACILITY): Payer: Medicare Other | Admitting: Nurse Practitioner

## 2020-07-20 DIAGNOSIS — R29898 Other symptoms and signs involving the musculoskeletal system: Secondary | ICD-10-CM | POA: Diagnosis not present

## 2020-07-20 DIAGNOSIS — K5901 Slow transit constipation: Secondary | ICD-10-CM

## 2020-07-20 DIAGNOSIS — F0390 Unspecified dementia without behavioral disturbance: Secondary | ICD-10-CM

## 2020-07-20 DIAGNOSIS — M8949 Other hypertrophic osteoarthropathy, multiple sites: Secondary | ICD-10-CM

## 2020-07-20 DIAGNOSIS — K269 Duodenal ulcer, unspecified as acute or chronic, without hemorrhage or perforation: Secondary | ICD-10-CM | POA: Diagnosis not present

## 2020-07-20 DIAGNOSIS — N3946 Mixed incontinence: Secondary | ICD-10-CM | POA: Diagnosis not present

## 2020-07-20 DIAGNOSIS — N3281 Overactive bladder: Secondary | ICD-10-CM | POA: Diagnosis not present

## 2020-07-20 DIAGNOSIS — M25662 Stiffness of left knee, not elsewhere classified: Secondary | ICD-10-CM | POA: Diagnosis not present

## 2020-07-20 DIAGNOSIS — Z9181 History of falling: Secondary | ICD-10-CM | POA: Diagnosis not present

## 2020-07-20 DIAGNOSIS — M159 Polyosteoarthritis, unspecified: Secondary | ICD-10-CM

## 2020-07-20 DIAGNOSIS — R2681 Unsteadiness on feet: Secondary | ICD-10-CM | POA: Diagnosis not present

## 2020-07-20 DIAGNOSIS — M6281 Muscle weakness (generalized): Secondary | ICD-10-CM | POA: Diagnosis not present

## 2020-07-20 NOTE — Assessment & Plan Note (Signed)
GERD/PUD, stable, on Pantoprazole 40mg  bid.

## 2020-07-20 NOTE — Assessment & Plan Note (Signed)
Urinary frequency, managed on Myrbetriq 50mg qd.  

## 2020-07-20 NOTE — Progress Notes (Signed)
Location:   [place] Room Number: 42 Place of Service:  SNF (31) Provider: Marlana Latus NP    Patient Care Team: Zeniah Briney X, NP as PCP - General (Internal Medicine) [name] Itati Brocksmith X, NP as Nurse Practitioner (Nurse Practitioner) [name], MD as Attending Physician (Urology) [name], MD as Consulting Physician (Gastroenterology)  Extended Emergency Contact Information Primary Emergency Contact: Montag,[name] Address: [place] [address] [name] of [place] Phone: [phone] Relation: Son  Code Status:  DNR Goals of care: Advanced Directive information Advanced Directives 07/20/2020  Does Patient Have a Medical Advance Directive? Yes  Type of Paramedic of [place];Living will;Out of facility DNR (pink MOST or yellow form)  Does patient want to make changes to medical advance directive? No - Patient declined  Copy of [place] in Chart? Yes - validated most recent copy scanned in chart (See row information)  Pre-existing out of facility DNR order (yellow form or pink MOST form) Yellow form placed in chart (order not valid for inpatient use)     Chief Complaint  Patient presents with  . Medical Management of Chronic Issues    Routine Visit.    HPI:  Pt is a 86 y.o. female seen today for medical management of chronic diseases.        Dementia,on Memantine 10mg  bid, Donepezil 5mg  qd for memory. MMSE 20/30 09/10/19 Hx of OA, stable, on Norco 5/325mg  bid, Tylenol 500mg  bid.            GERD/PUD, stable, on Pantoprazole 40mg  bid.  Urinary frequency, managed on Myrbetriq 50mg  qd. Constipation, stable, on prn Bisacodyl 5mg .  Past Medical History:  Diagnosis Date  . Allergic rhinitis due to pollen 07/01/2009  . Anemia, unspecified 09/07/2010  . Arthritis   . Benign paroxysmal positional vertigo   .  Candidiasis 01/01/2012  . Cramp of limb 01/11/2011  . Disorder of bone and cartilage, unspecified 11/10/2010  . Dizziness and giddiness 11/10/2010  . Insomnia, unspecified 09/07/2010  . Knee pain, left anterior   . Lumbago 07/01/2009  . Lump or mass in breast 08/01/2012  . Memory loss 01/22/2012  . Myalgia and myositis, unspecified 05/17/2011  . Myopathy, unspecified   . Osteoarthrosis, unspecified whether generalized or localized, lower leg   . Other abnormal glucose 03/23/2010  . Other chest pain 08/28/2012  . Other disorders of bone and cartilage(733.99) 09/19/2012  . Overactive bladder   . Rash    inner legs- ? med allergy- to see PCP if doesnt improve by end of week  . Rash and nonspecific skin eruption 10/28/2014  . Right bundle branch block   . Seborrheic keratoses 05/17/2011  . Spasm of muscle 01/11/2011  . Spinal stenosis, lumbar region, without neurogenic claudication   . Stress incontinence 07/08/2014  . Unspecified constipation 12/18/2011  . Unspecified tinnitus   . Unspecified urinary incontinence   . Urgency of urination   . Urinary frequency 09/18/2012   Past Surgical History:  Procedure Laterality Date  . ABDOMINAL HYSTERECTOMY    . APPENDECTOMY  1956  . BACK SURGERY     lumbar-- ? type  Dr Tonita Cong  . BREAST LUMPECTOMY  1993   breast cyst  . BREAST SURGERY     right lumpectomy  . CLEFT LIP REPAIR     with repair palate  . ESOPHAGOGASTRODUODENOSCOPY Left 03/10/2013   Procedure: ESOPHAGOGASTRODUODENOSCOPY (EGD);  Surgeon: Beryle Beams, MD;  Location: [facility];  Service: Endoscopy;  Laterality: Left;  . EYE SURGERY     bilateral cataract extraction with IOL  . TOTAL KNEE ARTHROPLASTY  12/13/2011   Procedure: TOTAL KNEE ARTHROPLASTY;  Surgeon: Johnn Hai, MD;  Location: [facility];  Service: Orthopedics;  Laterality: Left;    No Known Allergies  Allergies as of 07/20/2020   No Known Allergies     Medication List       Accurate as of July 20, 2020 11:59 PM. If  you have any questions, ask your nurse or doctor.        acetaminophen 500 MG tablet Commonly known as: TYLENOL Take 500 mg by mouth 2 (two) times daily.   bisacodyl 5 MG EC tablet Commonly known as: DULCOLAX Take 5 mg by mouth daily as needed.   donepezil 5 MG tablet Commonly known as: ARICEPT Take 5 mg by mouth at bedtime.   HYDROcodone-acetaminophen 5-325 MG tablet Commonly known as: NORCO/VICODIN Take 1 tablet by mouth 2 (two) times daily. For back pain   Lidocaine 4 % Ptch Apply 4 % topically daily. Remove Lidocaine Patch every evening from left knee. 12 hours on 12 hours off   memantine 10 MG tablet Commonly known as: NAMENDA Take 10 mg by mouth 2 (two) times daily.   Myrbetriq 50 MG Tb24 tablet Generic drug: mirabegron ER Take 50 mg by mouth daily.   pantoprazole 40 MG tablet Commonly known as: Protonix Take 1 tablet (40 mg total) by mouth 2 (two) times daily.       Review of Systems  Constitutional: Positive for unexpected weight change. Negative for activity change and fever.       #2Ibs weight gained in the past month.   HENT: Positive for hearing loss. Negative for congestion and voice change.   Eyes: Negative for visual disturbance.  Respiratory: Negative for cough.   Cardiovascular: Negative for leg swelling.  Gastrointestinal: Negative for abdominal pain and constipation.  Genitourinary: Positive for frequency. Negative for dysuria and urgency.  Musculoskeletal: Positive for arthralgias, back pain and gait problem.  Skin: Negative for color change.  Neurological: Negative for dizziness, speech difficulty and headaches.       Dementia  Psychiatric/Behavioral: Positive for confusion. Negative for behavioral problems and sleep disturbance. The patient is not nervous/anxious.     Immunization History  Administered Date(s) Administered  . Influenza Whole 05/27/2012, 06/10/2013, 06/06/2018  . Influenza, High Dose Seasonal PF 05/30/2019  .  Influenza-Unspecified 06/14/2014, 05/26/2015, 06/13/2016, 06/17/2017, 06/08/2020  . Moderna SARS-COVID-2 Vaccination 08/29/2019, 09/26/2019  . Pneumococcal Conjugate-13 06/24/2017  . Pneumococcal Polysaccharide-23 10/03/2011  . Td 10/03/2011  . Zoster 08/28/2007   Pertinent  Health Maintenance Due  Topic Date Due  . INFLUENZA VACCINE  Completed  . DEXA SCAN  Completed  . PNA vac Low Risk Adult  Completed   Fall Risk  03/27/2019 05/17/2017 09/15/2015 01/21/2014  Falls in the past year? (No Data) No No No  Comment Emmi Telephone Survey: data to providers prior to load - - -  Number falls in past yr: (No Data) - - -  Comment Emmi Telephone Survey Actual Response =  - - -   Functional Status Survey:    Vitals:   07/20/20 1406  BP: 122/80  Pulse: 74  Resp: 20  Temp: (!) 97.5 F (36.4 C)  SpO2: 93%  Weight: 184 lb 8 oz (83.7 kg)  Height: 5\' 2"  (1.575 m)   Body  mass index is 33.75 kg/m. Physical Exam Vitals and nursing note reviewed.  Constitutional:      Appearance: Normal appearance.  HENT:     Head: Normocephalic and atraumatic.     Mouth/Throat:     Mouth: Mucous membranes are moist.  Eyes:     Extraocular Movements: Extraocular movements intact.     Conjunctiva/sclera:     Right eye: Right conjunctiva is not injected.     Left eye: Left conjunctiva is not injected.     Pupils: Pupils are equal, round, and reactive to light.  Cardiovascular:     Rate and Rhythm: Normal rate and regular rhythm.     Heart sounds: No murmur heard.   Pulmonary:     Breath sounds: No rales.  Abdominal:     General: Bowel sounds are normal.     Palpations: Abdomen is soft.     Tenderness: There is no abdominal tenderness.  Musculoskeletal:     Cervical back: Normal range of motion and neck supple.     Right lower leg: No edema.     Left lower leg: No edema.     Comments: Ambulates with walker.   Skin:    General: Skin is warm and dry.  Neurological:     General: No focal deficit  present.     Mental Status: She is alert. Mental status is at baseline.     Gait: Gait abnormal.     Comments: Oriented to self, her room  Psychiatric:        Mood and Affect: Mood normal.        Behavior: Behavior normal.     Labs reviewed: Recent Labs    09/10/19 0000 02/11/20 0000  NA 142 142  K 4.3 4.1  CL 108 107  CO2 25* 27*  BUN 20 18  CREATININE 0.8 0.8  CALCIUM 9.4 9.1   Recent Labs    09/10/19 0000 02/11/20 0000  AST 16 12*  ALT 23 14  ALKPHOS 115 118  ALBUMIN 3.7 4.0   Recent Labs    09/10/19 0000 02/11/20 0000  WBC 7.0 6.4  NEUTROABS 4,256 3,802  HGB 14.2 14.6  HCT 43 44  PLT 295 235   Lab Results  Component Value Date   TSH 1.59 09/10/2019   Lab Results  Component Value Date   HGBA1C 5.6 04/24/2016   Lab Results  Component Value Date   CHOL 189 09/06/2015   HDL 65 09/06/2015   LDLCALC 99 09/06/2015   TRIG 126 09/06/2015    Significant Diagnostic Results in last 30 days:  No results found.  Assessment/Plan  Dementia without behavioral disturbance (HCC) Dementia,on Memantine 10mg  bid, Donepezil 5mg  qd for memory. 09/10/19 MMSE 20/30  Osteoarthritis, multiple sites Hx of OA, stable, on Norco 5/325mg  bid, Tylenol 500mg  bid.     Duodenal ulcer without hemorrhage or perforation GERD/PUD, stable, on Pantoprazole 40mg  bid.    Overactive bladder Urinary frequency, managed on Myrbetriq 50mg  qd.   Slow transit constipation Constipation, stable, on prn Bisacodyl 5mg .    Family/ staff Communication: plan of care reviewed with the patient and charge nurse.   Labs/tests ordered: none  Time spend 35 minutes.

## 2020-07-20 NOTE — Assessment & Plan Note (Signed)
Dementia,on Memantine 10mg  bid, Donepezil 5mg  qd for memory. 09/10/19 MMSE 20/30

## 2020-07-20 NOTE — Assessment & Plan Note (Signed)
Constipation, stable, on prn Bisacodyl 5mg .

## 2020-07-20 NOTE — Assessment & Plan Note (Signed)
Hx of OA, stable, on Norco 5/325mg  bid, Tylenol 500mg  bid.

## 2020-07-25 ENCOUNTER — Encounter: Payer: Self-pay | Admitting: Nurse Practitioner

## 2020-07-26 DIAGNOSIS — M25662 Stiffness of left knee, not elsewhere classified: Secondary | ICD-10-CM | POA: Diagnosis not present

## 2020-07-26 DIAGNOSIS — R2681 Unsteadiness on feet: Secondary | ICD-10-CM | POA: Diagnosis not present

## 2020-07-26 DIAGNOSIS — Z9181 History of falling: Secondary | ICD-10-CM | POA: Diagnosis not present

## 2020-07-26 DIAGNOSIS — R29898 Other symptoms and signs involving the musculoskeletal system: Secondary | ICD-10-CM | POA: Diagnosis not present

## 2020-07-26 DIAGNOSIS — N3946 Mixed incontinence: Secondary | ICD-10-CM | POA: Diagnosis not present

## 2020-07-26 DIAGNOSIS — M6281 Muscle weakness (generalized): Secondary | ICD-10-CM | POA: Diagnosis not present

## 2020-08-05 ENCOUNTER — Other Ambulatory Visit: Payer: Self-pay | Admitting: *Deleted

## 2020-08-05 DIAGNOSIS — M159 Polyosteoarthritis, unspecified: Secondary | ICD-10-CM

## 2020-08-05 DIAGNOSIS — M8949 Other hypertrophic osteoarthropathy, multiple sites: Secondary | ICD-10-CM

## 2020-08-05 MED ORDER — HYDROCODONE-ACETAMINOPHEN 5-325 MG PO TABS: 1.0000 | ORAL_TABLET | Freq: Two times a day (BID) | ORAL | 0 refills | Status: DC

## 2020-08-05 NOTE — Telephone Encounter (Signed)
Received refill Request from pharmacy Pended Rx and sent to [facility] for approval.

## 2020-08-09 ENCOUNTER — Encounter: Payer: Self-pay | Admitting: Internal Medicine

## 2020-08-09 ENCOUNTER — Non-Acute Institutional Stay (SKILLED_NURSING_FACILITY): Payer: Medicare Other | Admitting: Internal Medicine

## 2020-08-09 DIAGNOSIS — F0390 Unspecified dementia without behavioral disturbance: Secondary | ICD-10-CM | POA: Diagnosis not present

## 2020-08-09 DIAGNOSIS — M8949 Other hypertrophic osteoarthropathy, multiple sites: Secondary | ICD-10-CM

## 2020-08-09 DIAGNOSIS — K269 Duodenal ulcer, unspecified as acute or chronic, without hemorrhage or perforation: Secondary | ICD-10-CM

## 2020-08-09 DIAGNOSIS — N3281 Overactive bladder: Secondary | ICD-10-CM | POA: Diagnosis not present

## 2020-08-09 DIAGNOSIS — M159 Polyosteoarthritis, unspecified: Secondary | ICD-10-CM

## 2020-08-09 NOTE — Progress Notes (Signed)
Location:   [place] Room Number: 67 Place of Service:  SNF (31) Provider:  Veleta Miners MD  Mast, Man X, NP  Patient Care Team: Mast, Man X, NP as PCP - General (Internal Medicine) [name] Mast, Man X, NP as Nurse Practitioner (Nurse Practitioner) [name], MD as Attending Physician (Urology) [name], MD as Consulting Physician (Gastroenterology)  Extended Emergency Contact Information Primary Emergency Contact: Andaya,[name] Address: [place] [address] [name] of [place] Phone: [phone] Relation: Son  Code Status:  DNR Goals of care: Advanced Directive information Advanced Directives 07/20/2020  Does Patient Have a Medical Advance Directive? Yes  Type of Paramedic of [place];Living will;Out of facility DNR (pink MOST or yellow form)  Does patient want to make changes to medical advance directive? No - Patient declined  Copy of [place] in Chart? Yes - validated most recent copy scanned in chart (See row information)  Pre-existing out of facility DNR order (yellow form or pink MOST form) Yellow form placed in chart (order not valid for inpatient use)     Chief Complaint  Patient presents with  . Medical Management of Chronic Issues    HPI:  Pt is a 86 y.o. female seen today for medical management of chronic diseases.    She has h/o Lumbago, Stress incontinence, Vertigo, h/o Duodenal Ulcer and dementiaand Arthritis  Long term resident of facility Weight stable No New nursing issues Walks with the walker No Falls.  No Behavior issues Past Medical History:  Diagnosis Date  . Allergic rhinitis due to pollen 07/01/2009  . Anemia, unspecified 09/07/2010  . Arthritis   . Benign paroxysmal positional vertigo   . Candidiasis 01/01/2012  . Cramp of limb 01/11/2011  . Disorder of bone and cartilage, unspecified 11/10/2010  .  Dizziness and giddiness 11/10/2010  . Insomnia, unspecified 09/07/2010  . Knee pain, left anterior   . Lumbago 07/01/2009  . Lump or mass in breast 08/01/2012  . Memory loss 01/22/2012  . Myalgia and myositis, unspecified 05/17/2011  . Myopathy, unspecified   . Osteoarthrosis, unspecified whether generalized or localized, lower leg   . Other abnormal glucose 03/23/2010  . Other chest pain 08/28/2012  . Other disorders of bone and cartilage(733.99) 09/19/2012  . Overactive bladder   . Rash    inner legs- ? med allergy- to see PCP if doesnt improve by end of week  . Rash and nonspecific skin eruption 10/28/2014  . Right bundle branch block   . Seborrheic keratoses 05/17/2011  . Spasm of muscle 01/11/2011  . Spinal stenosis, lumbar region, without neurogenic claudication   . Stress incontinence 07/08/2014  . Unspecified constipation 12/18/2011  . Unspecified tinnitus   . Unspecified urinary incontinence   . Urgency of urination   . Urinary frequency 09/18/2012   Past Surgical History:  Procedure Laterality Date  . ABDOMINAL HYSTERECTOMY    . APPENDECTOMY  1956  . BACK SURGERY     lumbar-- ? type  Dr Tonita Cong  . BREAST LUMPECTOMY  1993   breast cyst  . BREAST SURGERY     right lumpectomy  . CLEFT LIP REPAIR     with repair palate  . ESOPHAGOGASTRODUODENOSCOPY Left 03/10/2013   Procedure: ESOPHAGOGASTRODUODENOSCOPY (EGD);  Surgeon: Beryle Beams, MD;  Location: [facility];  Service: Endoscopy;  Laterality: Left;  . EYE SURGERY     bilateral  cataract extraction with IOL  . TOTAL KNEE ARTHROPLASTY  12/13/2011   Procedure: TOTAL KNEE ARTHROPLASTY;  Surgeon: Johnn Hai, MD;  Location: [facility];  Service: Orthopedics;  Laterality: Left;    No Known Allergies  Allergies as of 08/09/2020   No Known Allergies     Medication List       Accurate as of August 09, 2020  3:57 PM. If you have any questions, ask your nurse or doctor.        acetaminophen 500 MG tablet Commonly known as:  TYLENOL Take 500 mg by mouth 2 (two) times daily.   bisacodyl 5 MG EC tablet Commonly known as: DULCOLAX Take 5 mg by mouth daily as needed.   donepezil 5 MG tablet Commonly known as: ARICEPT Take 5 mg by mouth at bedtime.   HYDROcodone-acetaminophen 5-325 MG tablet Commonly known as: NORCO/VICODIN Take 1 tablet by mouth 2 (two) times daily. For back pain   Lidocaine 4 % Ptch Apply 4 % topically daily. Remove Lidocaine Patch every evening from left knee. 12 hours on 12 hours off   memantine 10 MG tablet Commonly known as: NAMENDA Take 10 mg by mouth 2 (two) times daily.   Myrbetriq 50 MG Tb24 tablet Generic drug: mirabegron ER Take 50 mg by mouth daily.   pantoprazole 40 MG tablet Commonly known as: Protonix Take 1 tablet (40 mg total) by mouth 2 (two) times daily.       Review of Systems  Unable to perform ROS: Dementia    Immunization History  Administered Date(s) Administered  . Influenza Whole 05/27/2012, 06/10/2013, 06/06/2018  . Influenza, High Dose Seasonal PF 05/30/2019  . Influenza-Unspecified 06/14/2014, 05/26/2015, 06/13/2016, 06/17/2017, 06/08/2020  . Moderna Sars-Covid-2 Vaccination 08/29/2019, 09/26/2019  . Pneumococcal Conjugate-13 06/24/2017  . Pneumococcal Polysaccharide-23 10/03/2011  . Td 10/03/2011  . Zoster 08/28/2007   Pertinent  Health Maintenance Due  Topic Date Due  . INFLUENZA VACCINE  Completed  . DEXA SCAN  Completed  . PNA vac Low Risk Adult  Completed   Fall Risk  03/27/2019 05/17/2017 09/15/2015 01/21/2014  Falls in the past year? (No Data) No No No  Comment Emmi Telephone Survey: data to providers prior to load - - -  Number falls in past yr: (No Data) - - -  Comment Emmi Telephone Survey Actual Response =  - - -   Functional Status Survey:    Vitals:   08/09/20 1553  BP: 128/70  Pulse: 70  Resp: 16  Temp: (!) 97 F (36.1 C)  SpO2: 97%  Weight: 184 lb 4.8 oz (83.6 kg)  Height: 5\' 2"  (1.575 m)   Body mass index is  33.71 kg/m. Physical Exam Vitals reviewed.  Constitutional:      Appearance: Normal appearance.  HENT:     Head: Normocephalic.     Nose: Nose normal.     Mouth/Throat:     Mouth: Mucous membranes are moist.     Pharynx: Oropharynx is clear.  Eyes:     Pupils: Pupils are equal, round, and reactive to light.  Cardiovascular:     Rate and Rhythm: Normal rate.     Pulses: Normal pulses.     Heart sounds: Normal heart sounds.  Pulmonary:     Effort: Pulmonary effort is normal.     Breath sounds: Normal breath sounds.  Abdominal:     General: Abdomen is flat. Bowel sounds are normal.     Palpations: Abdomen is soft.  Musculoskeletal:  General: No swelling.     Cervical back: Neck supple.  Skin:    General: Skin is warm.  Neurological:     General: No focal deficit present.     Mental Status: She is alert.     Comments: Not Oriented Walks with her walker  Psychiatric:        Mood and Affect: Mood normal.     Labs reviewed: Recent Labs    09/10/19 0000 02/11/20 0000  NA 142 142  K 4.3 4.1  CL 108 107  CO2 25* 27*  BUN 20 18  CREATININE 0.8 0.8  CALCIUM 9.4 9.1   Recent Labs    09/10/19 0000 02/11/20 0000  AST 16 12*  ALT 23 14  ALKPHOS 115 118  ALBUMIN 3.7 4.0   Recent Labs    09/10/19 0000 02/11/20 0000  WBC 7.0 6.4  NEUTROABS 4,256 3,802  HGB 14.2 14.6  HCT 43 44  PLT 295 235   Lab Results  Component Value Date   TSH 1.59 09/10/2019   Lab Results  Component Value Date   HGBA1C 5.6 04/24/2016   Lab Results  Component Value Date   CHOL 189 09/06/2015   HDL 65 09/06/2015   LDLCALC 99 09/06/2015   TRIG 126 09/06/2015    Significant Diagnostic Results in last 30 days:  No results found.  Assessment/Plan  Dementia without behavioral disturbance,  Aricept and Namenda Primary osteoarthritis involving multiple joints On Norco Duodenal ulcer without hemorrhage or perforation On Protonix Overactive bladder Continue  Myrbetriq    Family/ staff Communication:   Labs/tests ordered:

## 2020-09-05 ENCOUNTER — Other Ambulatory Visit: Payer: Self-pay

## 2020-09-05 DIAGNOSIS — M8949 Other hypertrophic osteoarthropathy, multiple sites: Secondary | ICD-10-CM

## 2020-09-05 DIAGNOSIS — M159 Polyosteoarthritis, unspecified: Secondary | ICD-10-CM

## 2020-09-05 MED ORDER — HYDROCODONE-ACETAMINOPHEN 5-325 MG PO TABS: 1.0000 | ORAL_TABLET | Freq: Two times a day (BID) | ORAL | 0 refills | Status: DC

## 2020-09-05 NOTE — Telephone Encounter (Signed)
Refill request received from [facility] for Hydrocodone 5/325 mg tablet one twice a day for back pain. Medication pended and sent to Man X Mast, NP for approval.

## 2020-09-08 DIAGNOSIS — I10 Essential (primary) hypertension: Secondary | ICD-10-CM | POA: Diagnosis not present

## 2020-09-09 LAB — COMPREHENSIVE METABOLIC PANEL
Albumin: 3.5 (ref 3.5–5.0)
Calcium: 9.2 (ref 8.7–10.7)
Globulin: 2.5

## 2020-09-09 LAB — HEPATIC FUNCTION PANEL
ALT: 16 (ref 7–35)
AST: 15 (ref 13–35)
Alkaline Phosphatase: 120 (ref 25–125)
Bilirubin, Total: 0.3

## 2020-09-09 LAB — CBC: RBC: 4.87 (ref 3.87–5.11)

## 2020-09-09 LAB — TSH: TSH: 1.35 (ref 0.41–5.90)

## 2020-09-09 LAB — CBC AND DIFFERENTIAL
HCT: 43 (ref 36–46)
Hemoglobin: 14.9 (ref 12.0–16.0)
Platelets: 253 (ref 150–399)
WBC: 6

## 2020-09-09 LAB — BASIC METABOLIC PANEL
BUN: 18 (ref 4–21)
CO2: 25 — AB (ref 13–22)
Chloride: 108 (ref 99–108)
Creatinine: 0.9 (ref 0.5–1.1)
Glucose: 88
Potassium: 4.2 (ref 3.4–5.3)
Sodium: 142 (ref 137–147)

## 2020-09-22 ENCOUNTER — Non-Acute Institutional Stay (SKILLED_NURSING_FACILITY): Payer: Medicare Other | Admitting: Nurse Practitioner

## 2020-09-22 ENCOUNTER — Encounter: Payer: Self-pay | Admitting: Nurse Practitioner

## 2020-09-22 DIAGNOSIS — M159 Polyosteoarthritis, unspecified: Secondary | ICD-10-CM

## 2020-09-22 DIAGNOSIS — K269 Duodenal ulcer, unspecified as acute or chronic, without hemorrhage or perforation: Secondary | ICD-10-CM

## 2020-09-22 DIAGNOSIS — N3281 Overactive bladder: Secondary | ICD-10-CM

## 2020-09-22 DIAGNOSIS — K5901 Slow transit constipation: Secondary | ICD-10-CM

## 2020-09-22 DIAGNOSIS — M8949 Other hypertrophic osteoarthropathy, multiple sites: Secondary | ICD-10-CM

## 2020-09-22 DIAGNOSIS — F0390 Unspecified dementia without behavioral disturbance: Secondary | ICD-10-CM | POA: Diagnosis not present

## 2020-09-22 NOTE — Progress Notes (Signed)
Location:   [place] Room Number: 81 Place of Service:  SNF (31) Provider:  Veleta Miners MD  Nikolaus Pienta X, NP  Patient Care Team: Janesia Joswick X, NP as PCP - General (Internal Medicine) [name] Rohn Fritsch X, NP as Nurse Practitioner (Nurse Practitioner) [name], MD as Attending Physician (Urology) [name], MD as Consulting Physician (Gastroenterology)  Extended Emergency Contact Information Primary Emergency Contact: Hase,[name] Address: [place] [address] [name] of [place] Phone: [phone] Relation: Son  Code Status:  DNR Goals of care: Advanced Directive information Advanced Directives 07/20/2020  Does Patient Have a Medical Advance Directive? Yes  Type of Paramedic of [place];Living will;Out of facility DNR (pink MOST or yellow form)  Does patient want to make changes to medical advance directive? No - Patient declined  Copy of [place] in Chart? Yes - validated most recent copy scanned in chart (See row information)  Pre-existing out of facility DNR order (yellow form or pink MOST form) Yellow form placed in chart (order not valid for inpatient use)     Chief Complaint  Patient presents with  . Medical Management of Chronic Issues    HPI:  Pt is a 87 y.o. female seen today for medical management of chronic diseases.                 Dementia,on Memantine 10mg  bid, Donepezil 5mg  qd for memory. MMSE 20/30 09/10/19. TSH 1.35 09/09/20 Hx of OA, stable, on Norco 5/325mg  bid, Tylenol 500mg  bid.              GERD/PUD, stable, on Pantoprazole 40mg  bid. Hgb 14.9 09/09/20 Urinary frequency, managed on Myrbetriq 50mg  qd. Constipation, stable, on prn Bisacodyl 5mg .    Past Medical History:  Diagnosis Date  . Allergic rhinitis due to pollen 07/01/2009  . Anemia, unspecified 09/07/2010  .  Arthritis   . Benign paroxysmal positional vertigo   . Candidiasis 01/01/2012  . Cramp of limb 01/11/2011  . Disorder of bone and cartilage, unspecified 11/10/2010  . Dizziness and giddiness 11/10/2010  . Insomnia, unspecified 09/07/2010  . Knee pain, left anterior   . Lumbago 07/01/2009  . Lump or mass in breast 08/01/2012  . Memory loss 01/22/2012  . Myalgia and myositis, unspecified 05/17/2011  . Myopathy, unspecified   . Osteoarthrosis, unspecified whether generalized or localized, lower leg   . Other abnormal glucose 03/23/2010  . Other chest pain 08/28/2012  . Other disorders of bone and cartilage(733.99) 09/19/2012  . Overactive bladder   . Rash    inner legs- ? med allergy- to see PCP if doesnt improve by end of week  . Rash and nonspecific skin eruption 10/28/2014  . Right bundle branch block   . Seborrheic keratoses 05/17/2011  . Spasm of muscle 01/11/2011  . Spinal stenosis, lumbar region, without neurogenic claudication   . Stress incontinence 07/08/2014  . Unspecified constipation 12/18/2011  . Unspecified tinnitus   . Unspecified urinary incontinence   . Urgency of urination   . Urinary frequency 09/18/2012   Past Surgical History:  Procedure Laterality Date  . ABDOMINAL HYSTERECTOMY    . APPENDECTOMY  1956  . BACK SURGERY     lumbar-- ? type  Dr Tonita Cong  . BREAST LUMPECTOMY  1993   breast cyst  . BREAST SURGERY     right lumpectomy  . CLEFT LIP REPAIR  with repair palate  . ESOPHAGOGASTRODUODENOSCOPY Left 03/10/2013   Procedure: ESOPHAGOGASTRODUODENOSCOPY (EGD);  Surgeon: Beryle Beams, MD;  Location: [facility];  Service: Endoscopy;  Laterality: Left;  . EYE SURGERY     bilateral cataract extraction with IOL  . TOTAL KNEE ARTHROPLASTY  12/13/2011   Procedure: TOTAL KNEE ARTHROPLASTY;  Surgeon: Johnn Hai, MD;  Location: [facility];  Service: Orthopedics;  Laterality: Left;    No Known Allergies  Allergies as of 09/22/2020   No Known Allergies     Medication  List       Accurate as of September 22, 2020 11:59 PM. If you have any questions, ask your nurse or doctor.        acetaminophen 500 MG tablet Commonly known as: TYLENOL Take 500 mg by mouth 2 (two) times daily.   bisacodyl 5 MG EC tablet Commonly known as: DULCOLAX Take 5 mg by mouth daily as needed.   donepezil 5 MG tablet Commonly known as: ARICEPT Take 5 mg by mouth at bedtime.   HYDROcodone-acetaminophen 5-325 MG tablet Commonly known as: NORCO/VICODIN Take 1 tablet by mouth 2 (two) times daily. For back pain   Lidocaine 4 % Ptch Apply 4 % topically daily. Remove Lidocaine Patch every evening from left knee. 12 hours on 12 hours off   memantine 10 MG tablet Commonly known as: NAMENDA Take 10 mg by mouth 2 (two) times daily.   Myrbetriq 50 MG Tb24 tablet Generic drug: mirabegron ER Take 50 mg by mouth daily.   pantoprazole 40 MG tablet Commonly known as: Protonix Take 1 tablet (40 mg total) by mouth 2 (two) times daily.       Review of Systems  Constitutional: Negative for fatigue, fever and unexpected weight change.  HENT: Positive for hearing loss. Negative for congestion and voice change.   Eyes: Negative for visual disturbance.  Respiratory: Negative for cough.   Cardiovascular: Negative for leg swelling.  Gastrointestinal: Negative for abdominal pain and constipation.  Genitourinary: Positive for frequency. Negative for dysuria and urgency.  Musculoskeletal: Positive for arthralgias, back pain and gait problem.  Skin: Negative for color change.  Neurological: Negative for speech difficulty, light-headedness and headaches.       Dementia  Psychiatric/Behavioral: Positive for confusion. Negative for behavioral problems and sleep disturbance. The patient is not nervous/anxious.     Immunization History  Administered Date(s) Administered  . Influenza Whole 05/27/2012, 06/10/2013, 06/06/2018  . Influenza, High Dose Seasonal PF 05/30/2019  .  Influenza-Unspecified 06/14/2014, 05/26/2015, 06/13/2016, 06/17/2017, 06/08/2020  . Moderna Sars-Covid-2 Vaccination 08/29/2019, 09/26/2019, 07/05/2020  . Pneumococcal Conjugate-13 06/24/2017  . Pneumococcal Polysaccharide-23 10/03/2011  . Td 10/03/2011  . Zoster 08/28/2007   Pertinent  Health Maintenance Due  Topic Date Due  . INFLUENZA VACCINE  Completed  . DEXA SCAN  Completed  . PNA vac Low Risk Adult  Completed   Fall Risk  03/27/2019 05/17/2017 09/15/2015 01/21/2014  Falls in the past year? (No Data) No No No  Comment Emmi Telephone Survey: data to providers prior to load - - -  Number falls in past yr: (No Data) - - -  Comment Emmi Telephone Survey Actual Response =  - - -   Functional Status Survey:    Vitals:   09/22/20 1118  BP: 120/62  Pulse: 97  Resp: 18  Temp: (!) 97.3 F (36.3 C)  SpO2: 95%  Weight: 182 lb 9.6 oz (82.8 kg)  Height: 5\' 2"  (1.575 m)   Body mass index  is 33.4 kg/m. Physical Exam Vitals and nursing note reviewed.  Constitutional:      Appearance: Normal appearance.  HENT:     Head: Normocephalic and atraumatic.     Mouth/Throat:     Mouth: Mucous membranes are moist.  Eyes:     Extraocular Movements: Extraocular movements intact.     Conjunctiva/sclera:     Right eye: Right conjunctiva is not injected.     Left eye: Left conjunctiva is not injected.     Pupils: Pupils are equal, round, and reactive to light.  Cardiovascular:     Rate and Rhythm: Normal rate and regular rhythm.     Heart sounds: No murmur heard.   Pulmonary:     Breath sounds: No rales.  Abdominal:     General: Bowel sounds are normal.     Palpations: Abdomen is soft.     Tenderness: There is no abdominal tenderness.  Musculoskeletal:     Cervical back: Normal range of motion and neck supple.     Right lower leg: No edema.     Left lower leg: No edema.     Comments: Ambulates with walker.   Skin:    General: Skin is warm and dry.  Neurological:     General: No  focal deficit present.     Mental Status: She is alert. Mental status is at baseline.     Gait: Gait abnormal.     Comments: Oriented to self, her room  Psychiatric:        Mood and Affect: Mood normal.        Behavior: Behavior normal.     Labs reviewed: Recent Labs    02/11/20 0000 09/09/20 0000  NA 142 142  K 4.1 4.2  CL 107 108  CO2 27* 25*  BUN 18 18  CREATININE 0.8 0.9  CALCIUM 9.1 9.2   Recent Labs    02/11/20 0000 09/09/20 0000  AST 12* 15  ALT 14 16  ALKPHOS 118 120  ALBUMIN 4.0 3.5   Recent Labs    02/11/20 0000 09/09/20 0000  WBC 6.4 6.0  NEUTROABS 3,802  --   HGB 14.6 14.9  HCT 44 43  PLT 235 253   Lab Results  Component Value Date   TSH 1.35 09/09/2020   Lab Results  Component Value Date   HGBA1C 5.6 04/24/2016   Lab Results  Component Value Date   CHOL 189 09/06/2015   HDL 65 09/06/2015   LDLCALC 99 09/06/2015   TRIG 126 09/06/2015    Significant Diagnostic Results in last 30 days:  No results found.  Assessment/Plan Slow transit constipation Constipation, stable, on prn Bisacodyl 5mg .   Overactive bladder Urinary frequency, managed on Myrbetriq 50mg  qd.  Duodenal ulcer without hemorrhage or perforation GERD/PUD, stable, on Pantoprazole 40mg  bid. Hgb 14.9 09/09/20   Osteoarthritis, multiple sites Hx of OA, stable, on Norco 5/325mg  bid, Tylenol 500mg  bid.  Dementia without behavioral disturbance (HCC) Dementia,on Memantine 10mg  bid, Donepezil 5mg  qd for memory. MMSE 20/30 09/10/19. TSH 1.35 09/09/20     Family/ staff Communication: plan of care reviewed with the patient and charge nurse.   Labs/tests ordered:  none  Time spend 35 minutes. Marland Kitchen

## 2020-09-22 NOTE — Assessment & Plan Note (Signed)
Hx of OA, stable, on Norco 5/325mg bid, Tylenol 500mg bid.  

## 2020-09-22 NOTE — Assessment & Plan Note (Addendum)
GERD/PUD, stable, on Pantoprazole 40mg  bid. Hgb 14.9 09/09/20

## 2020-09-22 NOTE — Assessment & Plan Note (Signed)
Constipation, stable, on prn Bisacodyl 5mg.  

## 2020-09-22 NOTE — Assessment & Plan Note (Signed)
Urinary frequency, managed on Myrbetriq 50mg qd.  

## 2020-09-22 NOTE — Assessment & Plan Note (Addendum)
Dementia,on Memantine 10mg  bid, Donepezil 5mg  qd for memory. MMSE 20/30 09/10/19. TSH 1.35 09/09/20

## 2020-09-23 ENCOUNTER — Encounter: Payer: Self-pay | Admitting: Nurse Practitioner

## 2020-10-03 ENCOUNTER — Other Ambulatory Visit: Payer: Self-pay | Admitting: *Deleted

## 2020-10-03 DIAGNOSIS — M159 Polyosteoarthritis, unspecified: Secondary | ICD-10-CM

## 2020-10-03 DIAGNOSIS — M8949 Other hypertrophic osteoarthropathy, multiple sites: Secondary | ICD-10-CM

## 2020-10-03 MED ORDER — HYDROCODONE-ACETAMINOPHEN 5-325 MG PO TABS: 1.0000 | ORAL_TABLET | Freq: Two times a day (BID) | ORAL | 0 refills | Status: DC

## 2020-10-03 NOTE — Telephone Encounter (Signed)
Received refill Request from [facility] and sent to [facility] for approval.

## 2020-10-17 ENCOUNTER — Non-Acute Institutional Stay (SKILLED_NURSING_FACILITY): Payer: Medicare Other | Admitting: Nurse Practitioner

## 2020-10-17 ENCOUNTER — Encounter: Payer: Self-pay | Admitting: Nurse Practitioner

## 2020-10-17 DIAGNOSIS — M159 Polyosteoarthritis, unspecified: Secondary | ICD-10-CM

## 2020-10-17 DIAGNOSIS — F0150 Vascular dementia without behavioral disturbance: Secondary | ICD-10-CM

## 2020-10-17 DIAGNOSIS — N3281 Overactive bladder: Secondary | ICD-10-CM | POA: Diagnosis not present

## 2020-10-17 DIAGNOSIS — M8949 Other hypertrophic osteoarthropathy, multiple sites: Secondary | ICD-10-CM

## 2020-10-17 DIAGNOSIS — K269 Duodenal ulcer, unspecified as acute or chronic, without hemorrhage or perforation: Secondary | ICD-10-CM | POA: Diagnosis not present

## 2020-10-17 DIAGNOSIS — K5901 Slow transit constipation: Secondary | ICD-10-CM | POA: Diagnosis not present

## 2020-10-17 NOTE — Progress Notes (Signed)
Location:   [place] Room Number: 33 Place of Service:  SNF (31) Provider:  Veleta Miners MD  Nilda Keathley X, NP  Patient Care Team: Tawana Pasch X, NP as PCP - General (Internal Medicine) [name] Kiosha Buchan X, NP as Nurse Practitioner (Nurse Practitioner) [name], MD as Attending Physician (Urology) [name], MD as Consulting Physician (Gastroenterology)  Extended Emergency Contact Information Primary Emergency Contact: Forester,[name] Address: [place] [address] [name] of [place] Phone: [phone] Relation: Son  Code Status:  DNR Goals of care: Advanced Directive information Advanced Directives 10/17/2020  Does Patient Have a Medical Advance Directive? Yes  Type of Advance Directive Out of facility DNR (pink MOST or yellow form);Living will;Healthcare Power of Attorney  Does patient want to make changes to medical advance directive? No - Patient declined  Copy of [place] in Chart? Yes - validated most recent copy scanned in chart (See row information)  Pre-existing out of facility DNR order (yellow form or pink MOST form) Yellow form placed in chart (order not valid for inpatient use)     Chief Complaint  Patient presents with  . Medical Management of Chronic Issues    HPI:  Pt is a 87 y.o. female seen today for medical management of chronic diseases.      Dementia,on Memantine 10mg  bid, Donepezil 5mg  qd for memory.MMSE 20/30 09/10/19. TSH 1.35 09/09/20 Hx of OA, stable, on Norco 5/325mg  bid, Tylenol 500mg  bid.  GERD/PUD/gastric erosions, stable, on Pantoprazole 40mg  bid. Hgb 14.9 09/09/20 Urinary frequency, managed on Myrbetriq 50mg  qd. Constipation, stable, on prn Bisacodyl 5mg .   Past Medical History:  Diagnosis Date  . Allergic rhinitis due to pollen 07/01/2009  . Anemia, unspecified 09/07/2010  .  Arthritis   . Benign paroxysmal positional vertigo   . Candidiasis 01/01/2012  . Cramp of limb 01/11/2011  . Disorder of bone and cartilage, unspecified 11/10/2010  . Dizziness and giddiness 11/10/2010  . Insomnia, unspecified 09/07/2010  . Knee pain, left anterior   . Lumbago 07/01/2009  . Lump or mass in breast 08/01/2012  . Memory loss 01/22/2012  . Myalgia and myositis, unspecified 05/17/2011  . Myopathy, unspecified   . Osteoarthrosis, unspecified whether generalized or localized, lower leg   . Other abnormal glucose 03/23/2010  . Other chest pain 08/28/2012  . Other disorders of bone and cartilage(733.99) 09/19/2012  . Overactive bladder   . Rash    inner legs- ? med allergy- to see PCP if doesnt improve by end of week  . Rash and nonspecific skin eruption 10/28/2014  . Right bundle branch block   . Seborrheic keratoses 05/17/2011  . Spasm of muscle 01/11/2011  . Spinal stenosis, lumbar region, without neurogenic claudication   . Stress incontinence 07/08/2014  . Unspecified constipation 12/18/2011  . Unspecified tinnitus   . Unspecified urinary incontinence   . Urgency of urination   . Urinary frequency 09/18/2012   Past Surgical History:  Procedure Laterality Date  . ABDOMINAL HYSTERECTOMY    . APPENDECTOMY  1956  . BACK SURGERY     lumbar-- ? type  Dr Tonita Cong  . BREAST LUMPECTOMY  1993   breast cyst  . BREAST SURGERY     right lumpectomy  . CLEFT LIP REPAIR     with repair palate  . ESOPHAGOGASTRODUODENOSCOPY Left 03/10/2013   Procedure: ESOPHAGOGASTRODUODENOSCOPY (EGD);  Surgeon: Beryle Beams, MD;  Location: [facility];  Service: Endoscopy;  Laterality: Left;  . EYE SURGERY     bilateral cataract extraction with IOL  . TOTAL KNEE ARTHROPLASTY  12/13/2011   Procedure: TOTAL KNEE ARTHROPLASTY;  Surgeon: Johnn Hai, MD;  Location: [facility];  Service: Orthopedics;  Laterality: Left;    No Known Allergies  Allergies as of 10/17/2020   No Known Allergies     Medication  List       Accurate as of October 17, 2020 11:59 PM. If you have any questions, ask your nurse or doctor.        STOP taking these medications   bisacodyl 5 MG EC tablet Commonly known as: DULCOLAX Stopped by: Weston Kallman X Rossi Burdo, NP     TAKE these medications   acetaminophen 500 MG tablet Commonly known as: TYLENOL Take 500 mg by mouth 2 (two) times daily.   donepezil 5 MG tablet Commonly known as: ARICEPT Take 5 mg by mouth at bedtime.   HYDROcodone-acetaminophen 5-325 MG tablet Commonly known as: NORCO/VICODIN Take 1 tablet by mouth 2 (two) times daily. For back pain   Lidocaine 4 % Ptch Apply 4 % topically daily. Remove Lidocaine Patch every evening from left knee. 12 hours on 12 hours off   memantine 10 MG tablet Commonly known as: NAMENDA Take 10 mg by mouth 2 (two) times daily.   Myrbetriq 50 MG Tb24 tablet Generic drug: mirabegron ER Take 50 mg by mouth daily.   pantoprazole 40 MG tablet Commonly known as: Protonix Take 1 tablet (40 mg total) by mouth 2 (two) times daily.       Review of Systems  Constitutional: Negative for fatigue, fever and unexpected weight change.  HENT: Positive for hearing loss. Negative for congestion and voice change.   Eyes: Negative for visual disturbance.  Respiratory: Negative for cough.   Cardiovascular: Negative for leg swelling.  Gastrointestinal: Negative for abdominal pain and constipation.  Genitourinary: Positive for frequency. Negative for dysuria and urgency.  Musculoskeletal: Positive for arthralgias, back pain and gait problem.  Skin: Negative for color change.  Neurological: Negative for dizziness and speech difficulty.       Dementia  Psychiatric/Behavioral: Positive for confusion. Negative for behavioral problems and sleep disturbance.    Immunization History  Administered Date(s) Administered  . Influenza Whole 05/27/2012, 06/10/2013, 06/06/2018  . Influenza, High Dose Seasonal PF 05/30/2019  .  Influenza-Unspecified 06/14/2014, 05/26/2015, 06/13/2016, 06/17/2017, 06/08/2020  . Moderna Sars-Covid-2 Vaccination 08/29/2019, 09/26/2019, 07/05/2020  . Pneumococcal Conjugate-13 06/24/2017  . Pneumococcal Polysaccharide-23 10/03/2011  . Td 10/03/2011  . Zoster 08/28/2007   Pertinent  Health Maintenance Due  Topic Date Due  . INFLUENZA VACCINE  Completed  . DEXA SCAN  Completed  . PNA vac Low Risk Adult  Completed   Fall Risk  03/27/2019 05/17/2017 09/15/2015 01/21/2014  Falls in the past year? (No Data) No No No  Comment Emmi Telephone Survey: data to providers prior to load - - -  Number falls in past yr: (No Data) - - -  Comment Emmi Telephone Survey Actual Response =  - - -   Functional Status Survey:    Vitals:   10/17/20 0928  BP: 122/88  Pulse: 68  Resp: 20  Temp: (!) 97.3 F (36.3 C)  SpO2: 95%  Weight: 184 lb 3.2 oz (83.6 kg)  Height: 5\' 2"  (1.575 m)   Body mass index is 33.69 kg/m. Physical Exam Vitals and nursing note reviewed.  Constitutional:  Appearance: Normal appearance.  HENT:     Head: Normocephalic and atraumatic.  Eyes:     Extraocular Movements: Extraocular movements intact.     Conjunctiva/sclera:     Right eye: Right conjunctiva is not injected.     Left eye: Left conjunctiva is not injected.     Pupils: Pupils are equal, round, and reactive to light.  Cardiovascular:     Rate and Rhythm: Normal rate and regular rhythm.     Heart sounds: No murmur heard.   Pulmonary:     Breath sounds: No rales.  Abdominal:     General: Bowel sounds are normal.     Palpations: Abdomen is soft.     Tenderness: There is no abdominal tenderness.  Musculoskeletal:     Cervical back: Normal range of motion and neck supple.     Right lower leg: No edema.     Left lower leg: No edema.     Comments: Ambulates with walker.   Skin:    General: Skin is warm and dry.  Neurological:     General: No focal deficit present.     Mental Status: She is alert.  Mental status is at baseline.     Gait: Gait abnormal.     Comments: Oriented to self, her room  Psychiatric:        Mood and Affect: Mood normal.        Behavior: Behavior normal.     Labs reviewed: Recent Labs    02/11/20 0000 09/09/20 0000  NA 142 142  K 4.1 4.2  CL 107 108  CO2 27* 25*  BUN 18 18  CREATININE 0.8 0.9  CALCIUM 9.1 9.2   Recent Labs    02/11/20 0000 09/09/20 0000  AST 12* 15  ALT 14 16  ALKPHOS 118 120  ALBUMIN 4.0 3.5   Recent Labs    02/11/20 0000 09/09/20 0000  WBC 6.4 6.0  NEUTROABS 3,802  --   HGB 14.6 14.9  HCT 44 43  PLT 235 253   Lab Results  Component Value Date   TSH 1.35 09/09/2020   Lab Results  Component Value Date   HGBA1C 5.6 04/24/2016   Lab Results  Component Value Date   CHOL 189 09/06/2015   HDL 65 09/06/2015   LDLCALC 99 09/06/2015   TRIG 126 09/06/2015    Significant Diagnostic Results in last 30 days:  No results found.  Assessment/Plan Overactive bladder Urinary frequency, managed on Myrbetriq 50mg  qd.   Slow transit constipation stable, on prn Bisacodyl 5mg .   Duodenal ulcer without hemorrhage or perforation GERD/PUD/gastric erosions, stable, on Pantoprazole 40mg  bid. Hgb 14.9 09/09/20  Osteoarthritis, multiple sites  stable, on Norco 5/325mg  bid, Tylenol 500mg  bid.  Dementia without behavioral disturbance (HCC) on Memantine 10mg  bid, Donepezil 5mg  qd for memory.MMSE 20/30 09/10/19. TSH 1.35 09/09/20     Family/ staff Communication: plan of care reviewed with the patient and charge nurse.   Labs/tests ordered:  none  Time spend 35 minutes.

## 2020-10-18 ENCOUNTER — Encounter: Payer: Self-pay | Admitting: Nurse Practitioner

## 2020-10-18 NOTE — Assessment & Plan Note (Signed)
Urinary frequency, managed on Myrbetriq 50mg qd.  

## 2020-10-18 NOTE — Assessment & Plan Note (Signed)
stable, on prn Bisacodyl 5mg.  

## 2020-10-18 NOTE — Assessment & Plan Note (Signed)
stable, on Norco 5/325mg bid, Tylenol 500mg bid.  

## 2020-10-18 NOTE — Assessment & Plan Note (Signed)
on Memantine 10mg bid, Donepezil 5mg qd for memory.MMSE 20/30 09/10/19. TSH 1.35 09/09/20  

## 2020-10-18 NOTE — Assessment & Plan Note (Signed)
GERD/PUD/gastric erosions, stable, on Pantoprazole 40mg bid.Hgb 14.9 09/09/20  

## 2020-11-15 ENCOUNTER — Non-Acute Institutional Stay (SKILLED_NURSING_FACILITY): Payer: Medicare Other | Admitting: Internal Medicine

## 2020-11-15 ENCOUNTER — Encounter: Payer: Self-pay | Admitting: Internal Medicine

## 2020-11-15 DIAGNOSIS — K269 Duodenal ulcer, unspecified as acute or chronic, without hemorrhage or perforation: Secondary | ICD-10-CM

## 2020-11-15 DIAGNOSIS — F0150 Vascular dementia without behavioral disturbance: Secondary | ICD-10-CM

## 2020-11-15 DIAGNOSIS — M8949 Other hypertrophic osteoarthropathy, multiple sites: Secondary | ICD-10-CM | POA: Diagnosis not present

## 2020-11-15 DIAGNOSIS — N3281 Overactive bladder: Secondary | ICD-10-CM | POA: Diagnosis not present

## 2020-11-15 DIAGNOSIS — M159 Polyosteoarthritis, unspecified: Secondary | ICD-10-CM

## 2020-11-15 NOTE — Progress Notes (Signed)
Location:   [place] Room Number: 22 Place of Service:  SNF (31) Provider:  Veleta Miners MD  Mast, Man X, NP  Patient Care Team: Mast, Man X, NP as PCP - General (Internal Medicine) [name] Mast, Man X, NP as Nurse Practitioner (Nurse Practitioner) [name], MD as Attending Physician (Urology) [name], MD as Consulting Physician (Gastroenterology)  Extended Emergency Contact Information Primary Emergency Contact: Piacentini,[name] Address: [place] [address] [name] [place] Phone: [phone] Relation: Son  Code Status:  DNR Goals of care: Advanced Directive information Advanced Directives 11/15/2020  Does Patient Have a Medical Advance Directive? Yes  Type of Advance Directive Out of facility DNR (pink MOST or yellow form);Living will;Healthcare Power of Attorney  Does patient want to make changes to medical advance directive? -  Copy of [place] in Chart? Yes - validated most recent copy scanned in chart (See row information)  Pre-existing out of facility DNR order (yellow form or pink MOST form) Yellow form placed in chart (order not valid for inpatient use)     Chief Complaint  Patient presents with  . Medical Management of Chronic Issues    HPI:  Pt is a 87 y.o. female seen today for medical management of chronic diseases.    She has h/o Lumbago, Stress incontinence, Vertigo, h/o Duodenal Ulcer and dementiaand Arthritis  Long Term Care Patient  Weight stable  Walks with her walker No New Issues No behavior issues Past Medical History:  Diagnosis Date  . Allergic rhinitis due to pollen 07/01/2009  . Anemia, unspecified 09/07/2010  . Arthritis   . Benign paroxysmal positional vertigo   . Candidiasis 01/01/2012  . Cramp of limb 01/11/2011  . Disorder of bone and cartilage, unspecified 11/10/2010  . Dizziness and giddiness 11/10/2010  . Insomnia,  unspecified 09/07/2010  . Knee pain, left anterior   . Lumbago 07/01/2009  . Lump or mass in breast 08/01/2012  . Memory loss 01/22/2012  . Myalgia and myositis, unspecified 05/17/2011  . Myopathy, unspecified   . Osteoarthrosis, unspecified whether generalized or localized, lower leg   . Other abnormal glucose 03/23/2010  . Other chest pain 08/28/2012  . Other disorders of bone and cartilage(733.99) 09/19/2012  . Overactive bladder   . Rash    inner legs- ? med allergy- to see PCP if doesnt improve by end of week  . Rash and nonspecific skin eruption 10/28/2014  . Right bundle branch block   . Seborrheic keratoses 05/17/2011  . Spasm of muscle 01/11/2011  . Spinal stenosis, lumbar region, without neurogenic claudication   . Stress incontinence 07/08/2014  . Unspecified constipation 12/18/2011  . Unspecified tinnitus   . Unspecified urinary incontinence   . Urgency of urination   . Urinary frequency 09/18/2012   Past Surgical History:  Procedure Laterality Date  . ABDOMINAL HYSTERECTOMY    . APPENDECTOMY  1956  . BACK SURGERY     lumbar-- ? type  Dr Tonita Cong  . BREAST LUMPECTOMY  1993   breast cyst  . BREAST SURGERY     right lumpectomy  . CLEFT LIP REPAIR     with repair palate  . ESOPHAGOGASTRODUODENOSCOPY Left 03/10/2013   Procedure: ESOPHAGOGASTRODUODENOSCOPY (EGD);  Surgeon: Beryle Beams, MD;  Location: [facility];  Service: Endoscopy;  Laterality: Left;  . EYE SURGERY     bilateral cataract extraction with IOL  .  TOTAL KNEE ARTHROPLASTY  12/13/2011   Procedure: TOTAL KNEE ARTHROPLASTY;  Surgeon: Johnn Hai, MD;  Location: [facility];  Service: Orthopedics;  Laterality: Left;    No Known Allergies  Allergies as of 11/15/2020   No Known Allergies     Medication List       Accurate as of November 15, 2020 11:41 AM. If you have any questions, ask your nurse or doctor.        acetaminophen 500 MG tablet Commonly known as: TYLENOL Take 500 mg by mouth 2 (two) times daily.    donepezil 5 MG tablet Commonly known as: ARICEPT Take 5 mg by mouth at bedtime.   HYDROcodone-acetaminophen 5-325 MG tablet Commonly known as: NORCO/VICODIN Take 1 tablet by mouth 2 (two) times daily. For back pain   Lidocaine 4 % Ptch Apply 4 % topically daily. Remove Lidocaine Patch every evening from left knee. 12 hours on 12 hours off   memantine 10 MG tablet Commonly known as: NAMENDA Take 10 mg by mouth 2 (two) times daily.   Myrbetriq 50 MG Tb24 tablet Generic drug: mirabegron ER Take 50 mg by mouth daily.   pantoprazole 40 MG tablet Commonly known as: Protonix Take 1 tablet (40 mg total) by mouth 2 (two) times daily.       Review of Systems  Unable to perform ROS: Dementia    Immunization History  Administered Date(s) Administered  . Influenza Whole 05/27/2012, 06/10/2013, 06/06/2018  . Influenza, High Dose Seasonal PF 05/30/2019  . Influenza-Unspecified 06/14/2014, 05/26/2015, 06/13/2016, 06/17/2017, 06/08/2020  . Moderna Sars-Covid-2 Vaccination 08/29/2019, 09/26/2019, 07/05/2020  . Pneumococcal Conjugate-13 06/24/2017  . Pneumococcal Polysaccharide-23 10/03/2011  . Td 10/03/2011  . Zoster 08/28/2007   Pertinent  Health Maintenance Due  Topic Date Due  . INFLUENZA VACCINE  Completed  . DEXA SCAN  Completed  . PNA vac Low Risk Adult  Completed   Fall Risk  03/27/2019 05/17/2017 09/15/2015 01/21/2014  Falls in the past year? (No Data) No No No  Comment Emmi Telephone Survey: data to providers prior to load - - -  Number falls in past yr: (No Data) - - -  Comment Emmi Telephone Survey Actual Response =  - - -   Functional Status Survey:    Vitals:   11/15/20 1137  BP: 112/70  Pulse: 62  Resp: 20  Temp: (!) 97.5 F (36.4 C)  SpO2: 96%  Weight: 181 lb 9.6 oz (82.4 kg)  Height: 5\' 2"  (1.575 m)   Body mass index is 33.22 kg/m. Physical Exam  Constitutional:  Well-developed and well-nourished.  HENT:  Head: Normocephalic.  Mouth/Throat:  Oropharynx is clear and moist.  Eyes: Pupils are equal, round, and reactive to light.  Neck: Neck supple.  Cardiovascular: Normal rate and normal heart sounds.  No murmur heard. Pulmonary/Chest: Effort normal and breath sounds normal. No respiratory distress. No wheezes. She has no rales.  Abdominal: Soft. Bowel sounds are normal. No distension. There is no tenderness. There is no rebound.  Musculoskeletal: No edema.  Lymphadenopathy: none Neurological: No Focal Deficits Walks with her walker Oriented to herself  Skin: Skin is warm and dry.  Psychiatric: Normal mood and affect. Behavior is normal. Thought content normal.    Labs reviewed: Recent Labs    02/11/20 0000 09/09/20 0000  NA 142 142  K 4.1 4.2  CL 107 108  CO2 27* 25*  BUN 18 18  CREATININE 0.8 0.9  CALCIUM 9.1 9.2   Recent Labs  02/11/20 0000 09/09/20 0000  AST 12* 15  ALT 14 16  ALKPHOS 118 120  ALBUMIN 4.0 3.5   Recent Labs    02/11/20 0000 09/09/20 0000  WBC 6.4 6.0  NEUTROABS 3,802  --   HGB 14.6 14.9  HCT 44 43  PLT 235 253   Lab Results  Component Value Date   TSH 1.35 09/09/2020   Lab Results  Component Value Date   HGBA1C 5.6 04/24/2016   Lab Results  Component Value Date   CHOL 189 09/06/2015   HDL 65 09/06/2015   LDLCALC 99 09/06/2015   TRIG 126 09/06/2015    Significant Diagnostic Results in last 30 days:  No results found.  Assessment/Plan Overactive bladder Continue Myrebeetriq History of Duodenal ulcer without hemorrhage or perforation On Protonix Vascular dementia without behavioral disturbance (HCC) On Aricept and Namenda Supportive care Primary osteoarthritis involving multiple joints On Norco    Family/ staff Communication:   Labs/tests ordered:

## 2020-11-25 DIAGNOSIS — M6281 Muscle weakness (generalized): Secondary | ICD-10-CM | POA: Diagnosis not present

## 2020-11-25 DIAGNOSIS — R29898 Other symptoms and signs involving the musculoskeletal system: Secondary | ICD-10-CM | POA: Diagnosis not present

## 2020-11-25 DIAGNOSIS — M150 Primary generalized (osteo)arthritis: Secondary | ICD-10-CM | POA: Diagnosis not present

## 2020-11-25 DIAGNOSIS — R2681 Unsteadiness on feet: Secondary | ICD-10-CM | POA: Diagnosis not present

## 2020-11-28 DIAGNOSIS — R29898 Other symptoms and signs involving the musculoskeletal system: Secondary | ICD-10-CM | POA: Diagnosis not present

## 2020-11-28 DIAGNOSIS — R2681 Unsteadiness on feet: Secondary | ICD-10-CM | POA: Diagnosis not present

## 2020-11-28 DIAGNOSIS — M6281 Muscle weakness (generalized): Secondary | ICD-10-CM | POA: Diagnosis not present

## 2020-11-28 DIAGNOSIS — M150 Primary generalized (osteo)arthritis: Secondary | ICD-10-CM | POA: Diagnosis not present

## 2020-11-29 DIAGNOSIS — M150 Primary generalized (osteo)arthritis: Secondary | ICD-10-CM | POA: Diagnosis not present

## 2020-11-29 DIAGNOSIS — R2681 Unsteadiness on feet: Secondary | ICD-10-CM | POA: Diagnosis not present

## 2020-11-29 DIAGNOSIS — M6281 Muscle weakness (generalized): Secondary | ICD-10-CM | POA: Diagnosis not present

## 2020-11-29 DIAGNOSIS — R29898 Other symptoms and signs involving the musculoskeletal system: Secondary | ICD-10-CM | POA: Diagnosis not present

## 2020-11-30 DIAGNOSIS — M150 Primary generalized (osteo)arthritis: Secondary | ICD-10-CM | POA: Diagnosis not present

## 2020-11-30 DIAGNOSIS — M6281 Muscle weakness (generalized): Secondary | ICD-10-CM | POA: Diagnosis not present

## 2020-11-30 DIAGNOSIS — R2681 Unsteadiness on feet: Secondary | ICD-10-CM | POA: Diagnosis not present

## 2020-11-30 DIAGNOSIS — R29898 Other symptoms and signs involving the musculoskeletal system: Secondary | ICD-10-CM | POA: Diagnosis not present

## 2020-12-01 DIAGNOSIS — M6281 Muscle weakness (generalized): Secondary | ICD-10-CM | POA: Diagnosis not present

## 2020-12-01 DIAGNOSIS — R29898 Other symptoms and signs involving the musculoskeletal system: Secondary | ICD-10-CM | POA: Diagnosis not present

## 2020-12-01 DIAGNOSIS — R2681 Unsteadiness on feet: Secondary | ICD-10-CM | POA: Diagnosis not present

## 2020-12-01 DIAGNOSIS — M150 Primary generalized (osteo)arthritis: Secondary | ICD-10-CM | POA: Diagnosis not present

## 2020-12-02 DIAGNOSIS — M6281 Muscle weakness (generalized): Secondary | ICD-10-CM | POA: Diagnosis not present

## 2020-12-02 DIAGNOSIS — R2681 Unsteadiness on feet: Secondary | ICD-10-CM | POA: Diagnosis not present

## 2020-12-02 DIAGNOSIS — M150 Primary generalized (osteo)arthritis: Secondary | ICD-10-CM | POA: Diagnosis not present

## 2020-12-02 DIAGNOSIS — R29898 Other symptoms and signs involving the musculoskeletal system: Secondary | ICD-10-CM | POA: Diagnosis not present

## 2020-12-05 DIAGNOSIS — R2681 Unsteadiness on feet: Secondary | ICD-10-CM | POA: Diagnosis not present

## 2020-12-05 DIAGNOSIS — M6281 Muscle weakness (generalized): Secondary | ICD-10-CM | POA: Diagnosis not present

## 2020-12-05 DIAGNOSIS — M150 Primary generalized (osteo)arthritis: Secondary | ICD-10-CM | POA: Diagnosis not present

## 2020-12-05 DIAGNOSIS — R29898 Other symptoms and signs involving the musculoskeletal system: Secondary | ICD-10-CM | POA: Diagnosis not present

## 2020-12-06 DIAGNOSIS — M150 Primary generalized (osteo)arthritis: Secondary | ICD-10-CM | POA: Diagnosis not present

## 2020-12-06 DIAGNOSIS — R2681 Unsteadiness on feet: Secondary | ICD-10-CM | POA: Diagnosis not present

## 2020-12-06 DIAGNOSIS — R29898 Other symptoms and signs involving the musculoskeletal system: Secondary | ICD-10-CM | POA: Diagnosis not present

## 2020-12-06 DIAGNOSIS — M6281 Muscle weakness (generalized): Secondary | ICD-10-CM | POA: Diagnosis not present

## 2020-12-07 DIAGNOSIS — M6281 Muscle weakness (generalized): Secondary | ICD-10-CM | POA: Diagnosis not present

## 2020-12-07 DIAGNOSIS — R29898 Other symptoms and signs involving the musculoskeletal system: Secondary | ICD-10-CM | POA: Diagnosis not present

## 2020-12-07 DIAGNOSIS — R2681 Unsteadiness on feet: Secondary | ICD-10-CM | POA: Diagnosis not present

## 2020-12-07 DIAGNOSIS — M150 Primary generalized (osteo)arthritis: Secondary | ICD-10-CM | POA: Diagnosis not present

## 2020-12-08 DIAGNOSIS — R2681 Unsteadiness on feet: Secondary | ICD-10-CM | POA: Diagnosis not present

## 2020-12-08 DIAGNOSIS — M150 Primary generalized (osteo)arthritis: Secondary | ICD-10-CM | POA: Diagnosis not present

## 2020-12-08 DIAGNOSIS — R29898 Other symptoms and signs involving the musculoskeletal system: Secondary | ICD-10-CM | POA: Diagnosis not present

## 2020-12-08 DIAGNOSIS — M6281 Muscle weakness (generalized): Secondary | ICD-10-CM | POA: Diagnosis not present

## 2020-12-09 DIAGNOSIS — R2681 Unsteadiness on feet: Secondary | ICD-10-CM | POA: Diagnosis not present

## 2020-12-09 DIAGNOSIS — M150 Primary generalized (osteo)arthritis: Secondary | ICD-10-CM | POA: Diagnosis not present

## 2020-12-09 DIAGNOSIS — R29898 Other symptoms and signs involving the musculoskeletal system: Secondary | ICD-10-CM | POA: Diagnosis not present

## 2020-12-09 DIAGNOSIS — M6281 Muscle weakness (generalized): Secondary | ICD-10-CM | POA: Diagnosis not present

## 2020-12-12 ENCOUNTER — Non-Acute Institutional Stay (SKILLED_NURSING_FACILITY): Payer: Medicare Other | Admitting: Nurse Practitioner

## 2020-12-12 ENCOUNTER — Encounter: Payer: Self-pay | Admitting: Nurse Practitioner

## 2020-12-12 DIAGNOSIS — N3281 Overactive bladder: Secondary | ICD-10-CM

## 2020-12-12 DIAGNOSIS — K269 Duodenal ulcer, unspecified as acute or chronic, without hemorrhage or perforation: Secondary | ICD-10-CM

## 2020-12-12 DIAGNOSIS — R29898 Other symptoms and signs involving the musculoskeletal system: Secondary | ICD-10-CM | POA: Diagnosis not present

## 2020-12-12 DIAGNOSIS — K5901 Slow transit constipation: Secondary | ICD-10-CM

## 2020-12-12 DIAGNOSIS — R2681 Unsteadiness on feet: Secondary | ICD-10-CM | POA: Diagnosis not present

## 2020-12-12 DIAGNOSIS — M150 Primary generalized (osteo)arthritis: Secondary | ICD-10-CM | POA: Diagnosis not present

## 2020-12-12 DIAGNOSIS — M8949 Other hypertrophic osteoarthropathy, multiple sites: Secondary | ICD-10-CM | POA: Diagnosis not present

## 2020-12-12 DIAGNOSIS — R269 Unspecified abnormalities of gait and mobility: Secondary | ICD-10-CM

## 2020-12-12 DIAGNOSIS — M159 Polyosteoarthritis, unspecified: Secondary | ICD-10-CM

## 2020-12-12 DIAGNOSIS — F0150 Vascular dementia without behavioral disturbance: Secondary | ICD-10-CM

## 2020-12-12 DIAGNOSIS — G8929 Other chronic pain: Secondary | ICD-10-CM | POA: Diagnosis not present

## 2020-12-12 DIAGNOSIS — M25562 Pain in left knee: Secondary | ICD-10-CM | POA: Diagnosis not present

## 2020-12-12 DIAGNOSIS — M6281 Muscle weakness (generalized): Secondary | ICD-10-CM | POA: Diagnosis not present

## 2020-12-12 NOTE — Progress Notes (Signed)
Location:   SNF [place] Room Number: 37 Place of Service:  SNF (31) Provider: Lennie Odor Matin Mattioli NP  Hassani Sliney X, NP  Patient Care Team: Jakiya Bookbinder X, NP as PCP - General (Internal Medicine) [name] Jayveion Stalling X, NP as Nurse Practitioner (Nurse Practitioner) [name], MD as Attending Physician (Urology) [name], MD as Consulting Physician (Gastroenterology)  Extended Emergency Contact Information Primary Emergency Contact: Kerper,[name] Address: [place] [address] [name] [place] Phone: [phone] Relation: Son  Code Status: DNR Goals of care: Advanced Directive information Advanced Directives 11/15/2020  Does Patient Have a Medical Advance Directive? Yes  Type of Advance Directive Out of facility DNR (pink MOST or yellow form);Living will;Healthcare Power of Attorney  Does patient want to make changes to medical advance directive? -  Copy of [place] in Chart? Yes - validated most recent copy scanned in chart (See row information)  Pre-existing out of facility DNR order (yellow form or pink MOST form) Yellow form placed in chart (order not valid for inpatient use)     Chief Complaint  Patient presents with  . Acute Visit    Worsening of gait    HPI:  Pt is a 87 y.o. female seen today for an acute visit for staff reported the patient has a challenge ambulating which is sometimes her norm due to her dementia, the patient c/o left knee pain with ambulation, uses Lidoderm, no noted swelling, injury, redness, deformity of the left knee. S/p L TKR 2013. No noted focal weakness today.     Dementia,on Memantine 59m bid, Donepezil 538mqd for memory.MMSE 20/30 09/10/19. TSH 1.35 09/09/20 Hx of OA, stable, on Norco 5/32533mid, Tylenol 500m8md.  GERD/PUD/gastric erosions, stable, on Pantoprazole 40mg45m.Hgb 14.9 09/09/20 Urinary frequency,  managed on Myrbetriq 50mg 96mConstipation, stable, on prn Bisacodyl 5mg. P31m Medical History:  Diagnosis Date  . Allergic rhinitis due to pollen 07/01/2009  . Anemia, unspecified 09/07/2010  . Arthritis   . Benign paroxysmal positional vertigo   . Candidiasis 01/01/2012  . Cramp of limb 01/11/2011  . Disorder of bone and cartilage, unspecified 11/10/2010  . Dizziness and giddiness 11/10/2010  . Insomnia, unspecified 09/07/2010  . Knee pain, left anterior   . Lumbago 07/01/2009  . Lump or mass in breast 08/01/2012  . Memory loss 01/22/2012  . Myalgia and myositis, unspecified 05/17/2011  . Myopathy, unspecified   . Osteoarthrosis, unspecified whether generalized or localized, lower leg   . Other abnormal glucose 03/23/2010  . Other chest pain 08/28/2012  . Other disorders of bone and cartilage(733.99) 09/19/2012  . Overactive bladder   . Rash    inner legs- ? med allergy- to see PCP if doesnt improve by end of week  . Rash and nonspecific skin eruption 10/28/2014  . Right bundle branch block   . Seborrheic keratoses 05/17/2011  . Spasm of muscle 01/11/2011  . Spinal stenosis, lumbar region, without neurogenic claudication   . Stress incontinence 07/08/2014  . Unspecified constipation 12/18/2011  . Unspecified tinnitus   . Unspecified urinary incontinence   . Urgency of urination   . Urinary frequency 09/18/2012   Past Surgical History:  Procedure Laterality Date  . ABDOMINAL HYSTERECTOMY    . APPENDECTOMY  1956  . BACK SURGERY     lumbar-- ? type  Dr Beane  Tonita CongAST LUMPECTOMY  1993   breast cyst  .  BREAST SURGERY     right lumpectomy  . CLEFT LIP REPAIR     with repair palate  . ESOPHAGOGASTRODUODENOSCOPY Left 03/10/2013   Procedure: ESOPHAGOGASTRODUODENOSCOPY (EGD);  Surgeon: Beryle Beams, MD;  Location: [facility];  Service: Endoscopy;  Laterality: Left;  . EYE SURGERY     bilateral cataract extraction with IOL  . TOTAL KNEE ARTHROPLASTY  12/13/2011   Procedure:  TOTAL KNEE ARTHROPLASTY;  Surgeon: Johnn Hai, MD;  Location: [facility];  Service: Orthopedics;  Laterality: Left;    No Known Allergies  Allergies as of 12/12/2020   No Known Allergies     Medication List       Accurate as of December 12, 2020 11:59 PM. If you have any questions, ask your nurse or doctor.        acetaminophen 500 MG tablet Commonly known as: TYLENOL Take 500 mg by mouth 2 (two) times daily.   donepezil 5 MG tablet Commonly known as: ARICEPT Take 5 mg by mouth at bedtime.   HYDROcodone-acetaminophen 5-325 MG tablet Commonly known as: NORCO/VICODIN Take 1 tablet by mouth 2 (two) times daily. For back pain   Lidocaine 4 % Ptch Apply 4 % topically daily. Remove Lidocaine Patch every evening from left knee. 12 hours on 12 hours off   memantine 10 MG tablet Commonly known as: NAMENDA Take 10 mg by mouth 2 (two) times daily.   Myrbetriq 50 MG Tb24 tablet Generic drug: mirabegron ER Take 50 mg by mouth daily.   pantoprazole 40 MG tablet Commonly known as: Protonix Take 1 tablet (40 mg total) by mouth 2 (two) times daily.       Review of Systems  Constitutional: Negative for activity change, appetite change and fever.  HENT: Positive for hearing loss. Negative for congestion and voice change.   Eyes: Negative for visual disturbance.  Respiratory: Negative for cough.   Cardiovascular: Negative for leg swelling.  Gastrointestinal: Negative for abdominal pain and constipation.  Genitourinary: Positive for frequency. Negative for dysuria and urgency.  Musculoskeletal: Positive for arthralgias, back pain and gait problem.       Left knee pain  Skin: Negative for color change.  Neurological: Negative for facial asymmetry, speech difficulty, weakness, light-headedness and headaches.       Dementia  Psychiatric/Behavioral: Positive for confusion. Negative for behavioral problems and sleep disturbance.    Immunization History  Administered Date(s)  Administered  . Influenza Whole 05/27/2012, 06/10/2013, 06/06/2018  . Influenza, High Dose Seasonal PF 05/30/2019  . Influenza-Unspecified 06/14/2014, 05/26/2015, 06/13/2016, 06/17/2017, 06/08/2020  . Moderna Sars-Covid-2 Vaccination 08/29/2019, 09/26/2019, 07/05/2020  . Pneumococcal Conjugate-13 06/24/2017  . Pneumococcal Polysaccharide-23 10/03/2011  . Td 10/03/2011  . Zoster 08/28/2007   Pertinent  Health Maintenance Due  Topic Date Due  . INFLUENZA VACCINE  03/27/2021  . DEXA SCAN  Completed  . PNA vac Low Risk Adult  Completed   Fall Risk  03/27/2019 05/17/2017 09/15/2015 01/21/2014  Falls in the past year? (No Data) No No No  Comment Emmi Telephone Survey: data to providers prior to load - - -  Number falls in past yr: (No Data) - - -  Comment Emmi Telephone Survey Actual Response =  - - -   Functional Status Survey:    Vitals:   12/12/20 1122  BP: 115/73  Pulse: 91  Resp: 18  Temp: (!) 97.3 F (36.3 C)  SpO2: 94%   There is no height or weight on file to calculate BMI. Physical Exam  Vitals and nursing note reviewed.  Constitutional:      Appearance: Normal appearance.  HENT:     Head: Normocephalic and atraumatic.     Mouth/Throat:     Mouth: Mucous membranes are moist.  Eyes:     Extraocular Movements: Extraocular movements intact.     Conjunctiva/sclera:     Right eye: Right conjunctiva is not injected.     Left eye: Left conjunctiva is not injected.     Pupils: Pupils are equal, round, and reactive to light.  Cardiovascular:     Rate and Rhythm: Normal rate and regular rhythm.     Heart sounds: No murmur heard.   Pulmonary:     Breath sounds: No rales.  Abdominal:     General: Bowel sounds are normal.     Palpations: Abdomen is soft.     Tenderness: There is no abdominal tenderness.  Musculoskeletal:     Cervical back: Normal range of motion and neck supple.     Right lower leg: No edema.     Left lower leg: No edema.     Comments: Ambulates with  walker. Left knee pain when walking.   Skin:    General: Skin is warm and dry.  Neurological:     General: No focal deficit present.     Mental Status: She is alert. Mental status is at baseline.     Gait: Gait abnormal.     Comments: Oriented to self, her room  Psychiatric:        Mood and Affect: Mood normal.        Behavior: Behavior normal.     Labs reviewed: Recent Labs    02/11/20 0000 09/09/20 0000  NA 142 142  K 4.1 4.2  CL 107 108  CO2 27* 25*  BUN 18 18  CREATININE 0.8 0.9  CALCIUM 9.1 9.2   Recent Labs    02/11/20 0000 09/09/20 0000  AST 12* 15  ALT 14 16  ALKPHOS 118 120  ALBUMIN 4.0 3.5   Recent Labs    02/11/20 0000 09/09/20 0000  WBC 6.4 6.0  NEUTROABS 3,802  --   HGB 14.6 14.9  HCT 44 43  PLT 235 253   Lab Results  Component Value Date   TSH 1.35 09/09/2020   Lab Results  Component Value Date   HGBA1C 5.6 04/24/2016   Lab Results  Component Value Date   CHOL 189 09/06/2015   HDL 65 09/06/2015   LDLCALC 99 09/06/2015   TRIG 126 09/06/2015    Significant Diagnostic Results in last 30 days:  No results found.  Assessment/Plan: Gait abnormality Worsening, PT to eval, tx.   Left knee pain the patient c/o left knee pain with ambulation, uses Lidoderm, no noted swelling, injury, redness, deformity of the left knee. S/p L TKR 2013   Dementia without behavioral disturbance (HCC) on Memantine 76m bid, Donepezil 582mqd for memory.MMSE 20/30 09/10/19. TSH 1.35 09/09/20   Osteoarthritis, multiple sites stable, on Norco 5/32536mid, Tylenol 500m8md.   Duodenal ulcer without hemorrhage or perforation GERD/PUD/gastric erosions, stable, on Pantoprazole 40mg56m.Hgb 14.9 09/09/20   Overactive bladder managed on Myrbetriq 50mg 69m  Slow transit constipation stable, on prn Bisacodyl 5mg.  55mFamily/ staff Communication: plan of care reviewed with the patient and charge nurse.   Labs/tests ordered:  CBC/diff,  CMP/eGFR  Time spend 35 minutes.

## 2020-12-12 NOTE — Assessment & Plan Note (Signed)
on Memantine 10mg  bid, Donepezil 5mg  qd for memory.MMSE 20/30 09/10/19. TSH 1.35 09/09/20

## 2020-12-12 NOTE — Assessment & Plan Note (Signed)
the patient c/o left knee pain with ambulation, uses Lidoderm, no noted swelling, injury, redness, deformity of the left knee. S/p L TKR 2013

## 2020-12-12 NOTE — Assessment & Plan Note (Signed)
Worsening, PT to eval, tx.

## 2020-12-12 NOTE — Assessment & Plan Note (Signed)
stable, on prn Bisacodyl 5mg .

## 2020-12-12 NOTE — Assessment & Plan Note (Signed)
GERD/PUD/gastric erosions, stable, on Pantoprazole 40mg  bid.Hgb 14.9 09/09/20

## 2020-12-12 NOTE — Assessment & Plan Note (Signed)
managed on Myrbetriq 50mg  qd.

## 2020-12-12 NOTE — Assessment & Plan Note (Signed)
stable, on Norco 5/325mg  bid, Tylenol 500mg  bid.

## 2020-12-13 ENCOUNTER — Encounter: Payer: Self-pay | Admitting: Nurse Practitioner

## 2020-12-13 DIAGNOSIS — R2681 Unsteadiness on feet: Secondary | ICD-10-CM | POA: Diagnosis not present

## 2020-12-13 DIAGNOSIS — R29898 Other symptoms and signs involving the musculoskeletal system: Secondary | ICD-10-CM | POA: Diagnosis not present

## 2020-12-13 DIAGNOSIS — M6281 Muscle weakness (generalized): Secondary | ICD-10-CM | POA: Diagnosis not present

## 2020-12-13 DIAGNOSIS — I10 Essential (primary) hypertension: Secondary | ICD-10-CM | POA: Diagnosis not present

## 2020-12-13 DIAGNOSIS — M150 Primary generalized (osteo)arthritis: Secondary | ICD-10-CM | POA: Diagnosis not present

## 2020-12-14 DIAGNOSIS — M6281 Muscle weakness (generalized): Secondary | ICD-10-CM | POA: Diagnosis not present

## 2020-12-14 DIAGNOSIS — R29898 Other symptoms and signs involving the musculoskeletal system: Secondary | ICD-10-CM | POA: Diagnosis not present

## 2020-12-14 DIAGNOSIS — R2681 Unsteadiness on feet: Secondary | ICD-10-CM | POA: Diagnosis not present

## 2020-12-14 DIAGNOSIS — M150 Primary generalized (osteo)arthritis: Secondary | ICD-10-CM | POA: Diagnosis not present

## 2020-12-14 LAB — BASIC METABOLIC PANEL
BUN: 18 (ref 4–21)
CO2: 24 — AB (ref 13–22)
Chloride: 109 — AB (ref 99–108)
Creatinine: 0.7 (ref 0.5–1.1)
Glucose: 100
Potassium: 4.3 (ref 3.4–5.3)
Sodium: 143 (ref 137–147)

## 2020-12-14 LAB — HEPATIC FUNCTION PANEL
ALT: 14 (ref 7–35)
AST: 13 (ref 13–35)
Alkaline Phosphatase: 128 — AB (ref 25–125)
Bilirubin, Total: 0.4

## 2020-12-14 LAB — COMPREHENSIVE METABOLIC PANEL
Albumin: 3.6 (ref 3.5–5.0)
Calcium: 9.3 (ref 8.7–10.7)
GFR calc Af Amer: 85
GFR calc non Af Amer: 74
Globulin: 2.7

## 2020-12-14 LAB — CBC AND DIFFERENTIAL
HCT: 47 — AB (ref 36–46)
Hemoglobin: 15.2 (ref 12.0–16.0)
Neutrophils Absolute: 5686
Platelets: 337 (ref 150–399)
WBC: 8.3

## 2020-12-14 LAB — CBC: RBC: 0.31 — AB (ref 3.87–5.11)

## 2020-12-16 ENCOUNTER — Non-Acute Institutional Stay (SKILLED_NURSING_FACILITY): Payer: Medicare Other | Admitting: Nurse Practitioner

## 2020-12-16 ENCOUNTER — Encounter: Payer: Self-pay | Admitting: Nurse Practitioner

## 2020-12-16 DIAGNOSIS — M159 Polyosteoarthritis, unspecified: Secondary | ICD-10-CM

## 2020-12-16 DIAGNOSIS — N3281 Overactive bladder: Secondary | ICD-10-CM | POA: Diagnosis not present

## 2020-12-16 DIAGNOSIS — M8949 Other hypertrophic osteoarthropathy, multiple sites: Secondary | ICD-10-CM

## 2020-12-16 DIAGNOSIS — K5901 Slow transit constipation: Secondary | ICD-10-CM

## 2020-12-16 DIAGNOSIS — M6281 Muscle weakness (generalized): Secondary | ICD-10-CM | POA: Diagnosis not present

## 2020-12-16 DIAGNOSIS — F0150 Vascular dementia without behavioral disturbance: Secondary | ICD-10-CM | POA: Diagnosis not present

## 2020-12-16 DIAGNOSIS — R269 Unspecified abnormalities of gait and mobility: Secondary | ICD-10-CM | POA: Diagnosis not present

## 2020-12-16 DIAGNOSIS — R2681 Unsteadiness on feet: Secondary | ICD-10-CM | POA: Diagnosis not present

## 2020-12-16 DIAGNOSIS — K269 Duodenal ulcer, unspecified as acute or chronic, without hemorrhage or perforation: Secondary | ICD-10-CM | POA: Diagnosis not present

## 2020-12-16 DIAGNOSIS — M150 Primary generalized (osteo)arthritis: Secondary | ICD-10-CM | POA: Diagnosis not present

## 2020-12-16 DIAGNOSIS — R29898 Other symptoms and signs involving the musculoskeletal system: Secondary | ICD-10-CM | POA: Diagnosis not present

## 2020-12-16 NOTE — Assessment & Plan Note (Signed)
Dementia,on Memantine 10mg bid, Donepezil 5mg qd for memory. MMSE 20/30 09/10/19. TSH 1.35 09/09/20  

## 2020-12-16 NOTE — Assessment & Plan Note (Signed)
Hx of OA, s/p L TKR 2013,  on Norco 5/325mg bid, Tylenol 500mg bid.     

## 2020-12-16 NOTE — Assessment & Plan Note (Signed)
GERD/PUD/gastric erosions, stable, on Pantoprazole 40mg bid. Hgb 15/2 12/13/20 

## 2020-12-16 NOTE — Assessment & Plan Note (Signed)
Constipation, stable, on prn Bisacodyl 5mg.  

## 2020-12-16 NOTE — Assessment & Plan Note (Signed)
Gait abnormality, work with therapy, uses walker for ambulation.

## 2020-12-16 NOTE — Assessment & Plan Note (Signed)
Urinary frequency, managed on Myrbetriq 50mg qd.  

## 2020-12-16 NOTE — Progress Notes (Signed)
Location:   [facility] Room Number: 58 Place of Service:  SNF (31) Provider:  Shaheem Pichon X, NP  Jaison Petraglia X, NP  Patient Care Team: Philo Kurtz X, NP as PCP - General (Internal Medicine) [name] Ivalee Strauser X, NP as Nurse Practitioner (Nurse Practitioner) [name], MD as Attending Physician (Urology) [name], MD as Consulting Physician (Gastroenterology)  Extended Emergency Contact Information Primary Emergency Contact: Ow,[name] Address: [place] [address] [name] of [place] Phone: [phone] Relation: Son  Code Status:  DNR Goals of care: Advanced Directive information Advanced Directives 12/16/2020  Does Patient Have a Medical Advance Directive? Yes  Type of Paramedic of [place];Living will;Out of facility DNR (pink MOST or yellow form)  Does patient want to make changes to medical advance directive? No - Patient declined  Copy of [place] in Chart? Yes - validated most recent copy scanned in chart (See row information)  Pre-existing out of facility DNR order (yellow form or pink MOST form) Yellow form placed in chart (order not valid for inpatient use)     Chief Complaint  Patient presents with  . Medical Management of Chronic Issues    Routine follow up visit.    HPI:  Pt is a 87 y.o. female seen today for medical management of chronic diseases.    Dementia,on Memantine 10mg  bid, Donepezil 5mg  qd for memory.MMSE 20/30 09/10/19. TSH 1.35 09/09/20 Hx of OA, s/p L TKR 2013,  on Norco 5/325mg  bid, Tylenol 500mg  bid.   Gait abnormality, work with therapy, uses walker for ambulation.  GERD/PUD/gastric erosions, stable, on Pantoprazole 40mg  bid.Hgb 15/2 12/13/20 Urinary frequency, managed on Myrbetriq 50mg  qd. Constipation, stable, on prn Bisacodyl 5mg .   Past Medical History:   Diagnosis Date  . Allergic rhinitis due to pollen 07/01/2009  . Anemia, unspecified 09/07/2010  . Arthritis   . Benign paroxysmal positional vertigo   . Candidiasis 01/01/2012  . Cramp of limb 01/11/2011  . Disorder of bone and cartilage, unspecified 11/10/2010  . Dizziness and giddiness 11/10/2010  . Insomnia, unspecified 09/07/2010  . Knee pain, left anterior   . Lumbago 07/01/2009  . Lump or mass in breast 08/01/2012  . Memory loss 01/22/2012  . Myalgia and myositis, unspecified 05/17/2011  . Myopathy, unspecified   . Osteoarthrosis, unspecified whether generalized or localized, lower leg   . Other abnormal glucose 03/23/2010  . Other chest pain 08/28/2012  . Other disorders of bone and cartilage(733.99) 09/19/2012  . Overactive bladder   . Rash    inner legs- ? med allergy- to see PCP if doesnt improve by end of week  . Rash and nonspecific skin eruption 10/28/2014  . Right bundle branch block   . Seborrheic keratoses 05/17/2011  . Spasm of muscle 01/11/2011  . Spinal stenosis, lumbar region, without neurogenic claudication   . Stress incontinence 07/08/2014  . Unspecified constipation 12/18/2011  . Unspecified tinnitus   . Unspecified urinary incontinence   . Urgency of urination   . Urinary frequency 09/18/2012   Past Surgical History:  Procedure Laterality Date  . ABDOMINAL HYSTERECTOMY    . APPENDECTOMY  1956  . BACK SURGERY     lumbar-- ? type  Dr Tonita Cong  . BREAST LUMPECTOMY  1993   breast cyst  . BREAST SURGERY     right lumpectomy  . CLEFT LIP REPAIR  with repair palate  . ESOPHAGOGASTRODUODENOSCOPY Left 03/10/2013   Procedure: ESOPHAGOGASTRODUODENOSCOPY (EGD);  Surgeon: Beryle Beams, MD;  Location: [facility];  Service: Endoscopy;  Laterality: Left;  . EYE SURGERY     bilateral cataract extraction with IOL  . TOTAL KNEE ARTHROPLASTY  12/13/2011   Procedure: TOTAL KNEE ARTHROPLASTY;  Surgeon: Johnn Hai, MD;  Location: [facility];  Service: Orthopedics;  Laterality:  Left;    No Known Allergies  Allergies as of 12/16/2020   No Known Allergies     Medication List       Accurate as of December 16, 2020 11:59 PM. If you have any questions, ask your nurse or doctor.        acetaminophen 500 MG tablet Commonly known as: TYLENOL Take 500 mg by mouth 2 (two) times daily.   donepezil 5 MG tablet Commonly known as: ARICEPT Take 5 mg by mouth at bedtime.   HYDROcodone-acetaminophen 5-325 MG tablet Commonly known as: NORCO/VICODIN Take 1 tablet by mouth 2 (two) times daily. For back pain   Lidocaine 4 % Ptch Apply 4 % topically daily. Remove Lidocaine Patch every evening from left knee. 12 hours on 12 hours off   memantine 10 MG tablet Commonly known as: NAMENDA Take 10 mg by mouth 2 (two) times daily.   Myrbetriq 50 MG Tb24 tablet Generic drug: mirabegron ER Take 50 mg by mouth daily.   pantoprazole 40 MG tablet Commonly known as: Protonix Take 1 tablet (40 mg total) by mouth 2 (two) times daily.       Review of Systems  Constitutional: Negative for fatigue, fever and unexpected weight change.  HENT: Positive for hearing loss. Negative for congestion and voice change.   Eyes: Negative for visual disturbance.  Respiratory: Negative for cough.   Cardiovascular: Negative for leg swelling.  Gastrointestinal: Negative for abdominal pain and constipation.  Genitourinary: Positive for frequency. Negative for dysuria and urgency.  Musculoskeletal: Positive for arthralgias, back pain and gait problem.       Left knee pain  Skin: Negative for color change.  Neurological: Negative for speech difficulty, weakness and headaches.       Dementia  Psychiatric/Behavioral: Positive for confusion. Negative for behavioral problems and sleep disturbance.    Immunization History  Administered Date(s) Administered  . Influenza Whole 05/27/2012, 06/10/2013, 06/06/2018  . Influenza, High Dose Seasonal PF 05/30/2019  . Influenza-Unspecified 06/14/2014,  05/26/2015, 06/13/2016, 06/17/2017, 06/08/2020  . Moderna Sars-Covid-2 Vaccination 08/29/2019, 09/26/2019, 07/05/2020  . Pneumococcal Conjugate-13 06/24/2017  . Pneumococcal Polysaccharide-23 10/03/2011  . Td 10/03/2011  . Zoster 08/28/2007   Pertinent  Health Maintenance Due  Topic Date Due  . INFLUENZA VACCINE  03/27/2021  . DEXA SCAN  Completed  . PNA vac Low Risk Adult  Completed   Fall Risk  03/27/2019 05/17/2017 09/15/2015 01/21/2014  Falls in the past year? (No Data) No No No  Comment Emmi Telephone Survey: data to providers prior to load - - -  Number falls in past yr: (No Data) - - -  Comment Emmi Telephone Survey Actual Response =  - - -   Functional Status Survey:    Vitals:   12/16/20 1105  BP: 139/73  Pulse: 74  Resp: 20  Temp: (!) 97.5 F (36.4 C)  SpO2: 95%  Weight: 181 lb 9.6 oz (82.4 kg)  Height: 5\' 2"  (1.575 m)   Body mass index is 33.22 kg/m. Physical Exam Vitals and nursing note reviewed.  Constitutional:  Appearance: Normal appearance.  HENT:     Head: Normocephalic and atraumatic.     Mouth/Throat:     Mouth: Mucous membranes are moist.  Eyes:     Extraocular Movements: Extraocular movements intact.     Conjunctiva/sclera:     Right eye: Right conjunctiva is not injected.     Left eye: Left conjunctiva is not injected.     Pupils: Pupils are equal, round, and reactive to light.  Cardiovascular:     Rate and Rhythm: Normal rate and regular rhythm.     Heart sounds: No murmur heard.   Pulmonary:     Breath sounds: No rales.  Abdominal:     General: Bowel sounds are normal.     Palpations: Abdomen is soft.     Tenderness: There is no abdominal tenderness.  Musculoskeletal:     Cervical back: Normal range of motion and neck supple.     Right lower leg: No edema.     Left lower leg: No edema.     Comments: Ambulates with walker. Left knee pain when walking.   Skin:    General: Skin is warm and dry.  Neurological:     General: No  focal deficit present.     Mental Status: She is alert. Mental status is at baseline.     Gait: Gait abnormal.     Comments: Oriented to self, her room  Psychiatric:        Mood and Affect: Mood normal.        Behavior: Behavior normal.     Labs reviewed: Recent Labs    02/11/20 0000 09/09/20 0000  NA 142 142  K 4.1 4.2  CL 107 108  CO2 27* 25*  BUN 18 18  CREATININE 0.8 0.9  CALCIUM 9.1 9.2   Recent Labs    02/11/20 0000 09/09/20 0000  AST 12* 15  ALT 14 16  ALKPHOS 118 120  ALBUMIN 4.0 3.5   Recent Labs    02/11/20 0000 09/09/20 0000  WBC 6.4 6.0  NEUTROABS 3,802  --   HGB 14.6 14.9  HCT 44 43  PLT 235 253   Lab Results  Component Value Date   TSH 1.35 09/09/2020   Lab Results  Component Value Date   HGBA1C 5.6 04/24/2016   Lab Results  Component Value Date   CHOL 189 09/06/2015   HDL 65 09/06/2015   LDLCALC 99 09/06/2015   TRIG 126 09/06/2015    Significant Diagnostic Results in last 30 days:  No results found.  Assessment/Plan Duodenal ulcer without hemorrhage or perforation GERD/PUD/gastric erosions, stable, on Pantoprazole 40mg  bid.Hgb 15/2 12/13/20   Overactive bladder Urinary frequency, managed on Myrbetriq 50mg  qd.  Slow transit constipation Constipation, stable, on prn Bisacodyl 5mg .   Gait abnormality Gait abnormality, work with therapy, uses walker for ambulation.   Osteoarthritis, multiple sites Hx of OA, s/p L TKR 2013,  on Norco 5/325mg  bid, Tylenol 500mg  bid.   Dementia without behavioral disturbance (HCC) Dementia,on Memantine 10mg  bid, Donepezil 5mg  qd for memory.MMSE 20/30 09/10/19. TSH 1.35 09/09/20      Family/ staff Communication: plan of care reviewed with the patient and charge nurse.   Labs/tests ordered:  none  Time spend 35 minutes.

## 2020-12-19 ENCOUNTER — Encounter: Payer: Self-pay | Admitting: Nurse Practitioner

## 2020-12-20 DIAGNOSIS — M150 Primary generalized (osteo)arthritis: Secondary | ICD-10-CM | POA: Diagnosis not present

## 2020-12-20 DIAGNOSIS — R29898 Other symptoms and signs involving the musculoskeletal system: Secondary | ICD-10-CM | POA: Diagnosis not present

## 2020-12-20 DIAGNOSIS — R2681 Unsteadiness on feet: Secondary | ICD-10-CM | POA: Diagnosis not present

## 2020-12-20 DIAGNOSIS — M6281 Muscle weakness (generalized): Secondary | ICD-10-CM | POA: Diagnosis not present

## 2020-12-21 DIAGNOSIS — R29898 Other symptoms and signs involving the musculoskeletal system: Secondary | ICD-10-CM | POA: Diagnosis not present

## 2020-12-21 DIAGNOSIS — M150 Primary generalized (osteo)arthritis: Secondary | ICD-10-CM | POA: Diagnosis not present

## 2020-12-21 DIAGNOSIS — M6281 Muscle weakness (generalized): Secondary | ICD-10-CM | POA: Diagnosis not present

## 2020-12-21 DIAGNOSIS — R2681 Unsteadiness on feet: Secondary | ICD-10-CM | POA: Diagnosis not present

## 2020-12-22 DIAGNOSIS — R29898 Other symptoms and signs involving the musculoskeletal system: Secondary | ICD-10-CM | POA: Diagnosis not present

## 2020-12-22 DIAGNOSIS — M150 Primary generalized (osteo)arthritis: Secondary | ICD-10-CM | POA: Diagnosis not present

## 2020-12-22 DIAGNOSIS — R2681 Unsteadiness on feet: Secondary | ICD-10-CM | POA: Diagnosis not present

## 2020-12-22 DIAGNOSIS — M6281 Muscle weakness (generalized): Secondary | ICD-10-CM | POA: Diagnosis not present

## 2020-12-23 DIAGNOSIS — R29898 Other symptoms and signs involving the musculoskeletal system: Secondary | ICD-10-CM | POA: Diagnosis not present

## 2020-12-23 DIAGNOSIS — M150 Primary generalized (osteo)arthritis: Secondary | ICD-10-CM | POA: Diagnosis not present

## 2020-12-23 DIAGNOSIS — M6281 Muscle weakness (generalized): Secondary | ICD-10-CM | POA: Diagnosis not present

## 2020-12-23 DIAGNOSIS — R2681 Unsteadiness on feet: Secondary | ICD-10-CM | POA: Diagnosis not present

## 2020-12-26 DIAGNOSIS — R29898 Other symptoms and signs involving the musculoskeletal system: Secondary | ICD-10-CM | POA: Diagnosis not present

## 2020-12-26 DIAGNOSIS — M150 Primary generalized (osteo)arthritis: Secondary | ICD-10-CM | POA: Diagnosis not present

## 2020-12-26 DIAGNOSIS — M2681 Anterior soft tissue impingement: Secondary | ICD-10-CM | POA: Diagnosis not present

## 2020-12-26 DIAGNOSIS — R2681 Unsteadiness on feet: Secondary | ICD-10-CM | POA: Diagnosis not present

## 2020-12-27 DIAGNOSIS — R2681 Unsteadiness on feet: Secondary | ICD-10-CM | POA: Diagnosis not present

## 2020-12-27 DIAGNOSIS — M2681 Anterior soft tissue impingement: Secondary | ICD-10-CM | POA: Diagnosis not present

## 2020-12-27 DIAGNOSIS — M150 Primary generalized (osteo)arthritis: Secondary | ICD-10-CM | POA: Diagnosis not present

## 2020-12-27 DIAGNOSIS — R29898 Other symptoms and signs involving the musculoskeletal system: Secondary | ICD-10-CM | POA: Diagnosis not present

## 2020-12-28 DIAGNOSIS — M2681 Anterior soft tissue impingement: Secondary | ICD-10-CM | POA: Diagnosis not present

## 2020-12-28 DIAGNOSIS — M150 Primary generalized (osteo)arthritis: Secondary | ICD-10-CM | POA: Diagnosis not present

## 2020-12-28 DIAGNOSIS — R29898 Other symptoms and signs involving the musculoskeletal system: Secondary | ICD-10-CM | POA: Diagnosis not present

## 2020-12-28 DIAGNOSIS — R2681 Unsteadiness on feet: Secondary | ICD-10-CM | POA: Diagnosis not present

## 2020-12-30 DIAGNOSIS — R29898 Other symptoms and signs involving the musculoskeletal system: Secondary | ICD-10-CM | POA: Diagnosis not present

## 2020-12-30 DIAGNOSIS — R2681 Unsteadiness on feet: Secondary | ICD-10-CM | POA: Diagnosis not present

## 2020-12-30 DIAGNOSIS — M150 Primary generalized (osteo)arthritis: Secondary | ICD-10-CM | POA: Diagnosis not present

## 2020-12-30 DIAGNOSIS — M2681 Anterior soft tissue impingement: Secondary | ICD-10-CM | POA: Diagnosis not present

## 2021-01-02 DIAGNOSIS — M150 Primary generalized (osteo)arthritis: Secondary | ICD-10-CM | POA: Diagnosis not present

## 2021-01-02 DIAGNOSIS — R29898 Other symptoms and signs involving the musculoskeletal system: Secondary | ICD-10-CM | POA: Diagnosis not present

## 2021-01-02 DIAGNOSIS — M2681 Anterior soft tissue impingement: Secondary | ICD-10-CM | POA: Diagnosis not present

## 2021-01-02 DIAGNOSIS — R2681 Unsteadiness on feet: Secondary | ICD-10-CM | POA: Diagnosis not present

## 2021-01-04 DIAGNOSIS — M2681 Anterior soft tissue impingement: Secondary | ICD-10-CM | POA: Diagnosis not present

## 2021-01-04 DIAGNOSIS — R29898 Other symptoms and signs involving the musculoskeletal system: Secondary | ICD-10-CM | POA: Diagnosis not present

## 2021-01-04 DIAGNOSIS — R2681 Unsteadiness on feet: Secondary | ICD-10-CM | POA: Diagnosis not present

## 2021-01-04 DIAGNOSIS — M150 Primary generalized (osteo)arthritis: Secondary | ICD-10-CM | POA: Diagnosis not present

## 2021-01-05 DIAGNOSIS — M150 Primary generalized (osteo)arthritis: Secondary | ICD-10-CM | POA: Diagnosis not present

## 2021-01-05 DIAGNOSIS — M2681 Anterior soft tissue impingement: Secondary | ICD-10-CM | POA: Diagnosis not present

## 2021-01-05 DIAGNOSIS — R29898 Other symptoms and signs involving the musculoskeletal system: Secondary | ICD-10-CM | POA: Diagnosis not present

## 2021-01-05 DIAGNOSIS — R2681 Unsteadiness on feet: Secondary | ICD-10-CM | POA: Diagnosis not present

## 2021-01-09 ENCOUNTER — Non-Acute Institutional Stay (SKILLED_NURSING_FACILITY): Payer: Medicare Other | Admitting: Nurse Practitioner

## 2021-01-09 ENCOUNTER — Encounter: Payer: Self-pay | Admitting: Nurse Practitioner

## 2021-01-09 DIAGNOSIS — F0150 Vascular dementia without behavioral disturbance: Secondary | ICD-10-CM | POA: Diagnosis not present

## 2021-01-09 DIAGNOSIS — M159 Polyosteoarthritis, unspecified: Secondary | ICD-10-CM

## 2021-01-09 DIAGNOSIS — K269 Duodenal ulcer, unspecified as acute or chronic, without hemorrhage or perforation: Secondary | ICD-10-CM

## 2021-01-09 DIAGNOSIS — R269 Unspecified abnormalities of gait and mobility: Secondary | ICD-10-CM

## 2021-01-09 DIAGNOSIS — M8949 Other hypertrophic osteoarthropathy, multiple sites: Secondary | ICD-10-CM | POA: Diagnosis not present

## 2021-01-09 DIAGNOSIS — N3281 Overactive bladder: Secondary | ICD-10-CM

## 2021-01-09 DIAGNOSIS — M150 Primary generalized (osteo)arthritis: Secondary | ICD-10-CM | POA: Diagnosis not present

## 2021-01-09 DIAGNOSIS — K5901 Slow transit constipation: Secondary | ICD-10-CM

## 2021-01-09 DIAGNOSIS — M2681 Anterior soft tissue impingement: Secondary | ICD-10-CM | POA: Diagnosis not present

## 2021-01-09 DIAGNOSIS — R2681 Unsteadiness on feet: Secondary | ICD-10-CM | POA: Diagnosis not present

## 2021-01-09 DIAGNOSIS — R29898 Other symptoms and signs involving the musculoskeletal system: Secondary | ICD-10-CM | POA: Diagnosis not present

## 2021-01-09 NOTE — Progress Notes (Signed)
Location:   [facility] Room Number: [phone] Place of Service:  SNF (31) Provider:  Graciano Batson X Zymier Rodgers, NP  Maceo Hernan X, NP  Patient Care Team: Josiyah Tozzi X, NP as PCP - General (Internal Medicine) [name] Llesenia Fogal X, NP as Nurse Practitioner (Nurse Practitioner) [name], MD as Attending Physician (Urology) [name], MD as Consulting Physician (Gastroenterology)  Extended Emergency Contact Information Primary Emergency Contact: Vlachos,[name] Address: [place] [address] [name] of [place] Phone: [phone] Relation: Son  Code Status:  DNR Goals of care: Advanced Directive information Advanced Directives 01/09/2021  Does Patient Have a Medical Advance Directive? Yes  Type of Paramedic of [place];Living will;Out of facility DNR (pink MOST or yellow form)  Does patient want to make changes to medical advance directive? No - Patient declined  Copy of [place] in Chart? Yes - validated most recent copy scanned in chart (See row information)  Pre-existing out of facility DNR order (yellow form or pink MOST form) Yellow form placed in chart (order not valid for inpatient use)     Chief Complaint  Patient presents with  . Medical Management of Chronic Issues    Routine follow up.     HPI:  Pt is a 87 y.o. female seen today for medical management of chronic diseases.    Dementia,on Memantine 10mg  bid, Donepezil 5mg  qd for memory.MMSE 20/30 09/10/19. TSH 1.35 09/09/20 Hx of OA, s/p L TKR 2013,  on Norco 5/325mg  bid, Tylenol 500mg  bid.              Gait abnormalit, uses walker for ambulation.  GERD/PUD/gastric erosions, stable, on Pantoprazole 40mg  bid.Hgb 15/2 12/13/20 Urinary frequency, managed on Myrbetriq 50mg  qd. Constipation, stable, on prn Bisacodyl 5mg .   Past Medical History:  Diagnosis  Date  . Allergic rhinitis due to pollen 07/01/2009  . Anemia, unspecified 09/07/2010  . Arthritis   . Benign paroxysmal positional vertigo   . Candidiasis 01/01/2012  . Cramp of limb 01/11/2011  . Disorder of bone and cartilage, unspecified 11/10/2010  . Dizziness and giddiness 11/10/2010  . Insomnia, unspecified 09/07/2010  . Knee pain, left anterior   . Lumbago 07/01/2009  . Lump or mass in breast 08/01/2012  . Memory loss 01/22/2012  . Myalgia and myositis, unspecified 05/17/2011  . Myopathy, unspecified   . Osteoarthrosis, unspecified whether generalized or localized, lower leg   . Other abnormal glucose 03/23/2010  . Other chest pain 08/28/2012  . Other disorders of bone and cartilage(733.99) 09/19/2012  . Overactive bladder   . Rash    inner legs- ? med allergy- to see PCP if doesnt improve by end of week  . Rash and nonspecific skin eruption 10/28/2014  . Right bundle branch block   . Seborrheic keratoses 05/17/2011  . Spasm of muscle 01/11/2011  . Spinal stenosis, lumbar region, without neurogenic claudication   . Stress incontinence 07/08/2014  . Unspecified constipation 12/18/2011  . Unspecified tinnitus   . Unspecified urinary incontinence   . Urgency of urination   . Urinary frequency 09/18/2012   Past Surgical History:  Procedure Laterality Date  . ABDOMINAL HYSTERECTOMY    . APPENDECTOMY  1956  . BACK SURGERY     lumbar-- ? type  Dr Tonita Cong  . BREAST LUMPECTOMY  1993   breast cyst  . BREAST SURGERY     right lumpectomy  .  CLEFT LIP REPAIR     with repair palate  . ESOPHAGOGASTRODUODENOSCOPY Left 03/10/2013   Procedure: ESOPHAGOGASTRODUODENOSCOPY (EGD);  Surgeon: Beryle Beams, MD;  Location: [facility];  Service: Endoscopy;  Laterality: Left;  . EYE SURGERY     bilateral cataract extraction with IOL  . TOTAL KNEE ARTHROPLASTY  12/13/2011   Procedure: TOTAL KNEE ARTHROPLASTY;  Surgeon: Johnn Hai, MD;  Location: [facility];  Service: Orthopedics;  Laterality: Left;    No  Known Allergies  Allergies as of 01/09/2021   No Known Allergies     Medication List       Accurate as of Jan 09, 2021 11:59 PM. If you have any questions, ask your nurse or doctor.        acetaminophen 500 MG tablet Commonly known as: TYLENOL Take 500 mg by mouth 2 (two) times daily.   donepezil 5 MG tablet Commonly known as: ARICEPT Take 5 mg by mouth at bedtime.   HYDROcodone-acetaminophen 5-325 MG tablet Commonly known as: NORCO/VICODIN Take 1 tablet by mouth 2 (two) times daily. For back pain   Lidocaine 4 % Ptch Apply 4 % topically daily. Remove Lidocaine Patch every evening from left knee. 12 hours on 12 hours off   memantine 10 MG tablet Commonly known as: NAMENDA Take 10 mg by mouth 2 (two) times daily.   Myrbetriq 50 MG Tb24 tablet Generic drug: mirabegron ER Take 50 mg by mouth daily.   pantoprazole 40 MG tablet Commonly known as: Protonix Take 1 tablet (40 mg total) by mouth 2 (two) times daily.   PreviDent 5000 Booster Plus 1.1 % Pste Generic drug: Sodium Fluoride Place 1 application onto teeth at bedtime.       Review of Systems  Constitutional: Negative for activity change, appetite change and unexpected weight change.  HENT: Positive for hearing loss. Negative for congestion and voice change.   Eyes: Negative for visual disturbance.  Respiratory: Negative for cough.   Cardiovascular: Negative for leg swelling.  Gastrointestinal: Negative for abdominal pain and constipation.  Genitourinary: Positive for frequency. Negative for dysuria and urgency.  Musculoskeletal: Positive for arthralgias, back pain and gait problem.       Left knee pain  Skin: Negative for color change.  Neurological: Negative for speech difficulty, weakness and light-headedness.       Dementia  Psychiatric/Behavioral: Positive for confusion. Negative for behavioral problems and sleep disturbance.    Immunization History  Administered Date(s) Administered  . Influenza  Whole 05/27/2012, 06/10/2013, 06/06/2018  . Influenza, High Dose Seasonal PF 05/30/2019  . Influenza-Unspecified 06/14/2014, 05/26/2015, 06/13/2016, 06/17/2017, 06/08/2020  . Moderna Sars-Covid-2 Vaccination 08/29/2019, 09/26/2019, 07/05/2020  . Pneumococcal Conjugate-13 06/24/2017  . Pneumococcal Polysaccharide-23 10/03/2011  . Td 10/03/2011  . Zoster 08/28/2007   Pertinent  Health Maintenance Due  Topic Date Due  . INFLUENZA VACCINE  03/27/2021  . DEXA SCAN  Completed  . PNA vac Low Risk Adult  Completed   Fall Risk  03/27/2019 05/17/2017 09/15/2015 01/21/2014  Falls in the past year? (No Data) No No No  Comment Emmi Telephone Survey: data to providers prior to load - - -  Number falls in past yr: (No Data) - - -  Comment Emmi Telephone Survey Actual Response =  - - -   Functional Status Survey:    Vitals:   01/09/21 1138  BP: 124/64  Pulse: 86  Resp: 18  Temp: (!) 96.4 F (35.8 C)  SpO2: 94%  Weight: 181 lb 8 oz (  82.3 kg)  Height: 5\' 2"  (1.575 m)   Body mass index is 33.2 kg/m. Physical Exam Vitals and nursing note reviewed.  Constitutional:      Appearance: Normal appearance.  HENT:     Head: Normocephalic and atraumatic.     Mouth/Throat:     Mouth: Mucous membranes are moist.  Eyes:     Extraocular Movements: Extraocular movements intact.     Conjunctiva/sclera:     Right eye: Right conjunctiva is not injected.     Left eye: Left conjunctiva is not injected.     Pupils: Pupils are equal, round, and reactive to light.  Cardiovascular:     Rate and Rhythm: Normal rate and regular rhythm.     Heart sounds: No murmur heard.   Pulmonary:     Breath sounds: No rales.  Abdominal:     General: Bowel sounds are normal.     Palpations: Abdomen is soft.     Tenderness: There is no abdominal tenderness.  Musculoskeletal:     Cervical back: Normal range of motion and neck supple.     Right lower leg: No edema.     Left lower leg: No edema.     Comments:  Ambulates with walker. Left knee pain when walking.   Skin:    General: Skin is warm and dry.  Neurological:     General: No focal deficit present.     Mental Status: She is alert. Mental status is at baseline.     Gait: Gait abnormal.     Comments: Oriented to self, her room. Ambulates with walker with SBA  Psychiatric:        Mood and Affect: Mood normal.        Behavior: Behavior normal.     Labs reviewed: Recent Labs    02/11/20 0000 09/09/20 0000 12/14/20 0108  NA 142 142 143  K 4.1 4.2 4.3  CL 107 108 109*  CO2 27* 25* 24*  BUN 18 18 18   CREATININE 0.8 0.9 0.7  CALCIUM 9.1 9.2 9.3   Recent Labs    02/11/20 0000 09/09/20 0000 12/14/20 0108  AST 12* 15 13  ALT 14 16 14   ALKPHOS 118 120 128*  ALBUMIN 4.0 3.5 3.6   Recent Labs    02/11/20 0000 09/09/20 0000 12/14/20 0108  WBC 6.4 6.0 8.3  NEUTROABS 3,802  --  5,686.00  HGB 14.6 14.9 15.2  HCT 44 43 47*  PLT 235 253 337   Lab Results  Component Value Date   TSH 1.35 09/09/2020   Lab Results  Component Value Date   HGBA1C 5.6 04/24/2016   Lab Results  Component Value Date   CHOL 189 09/06/2015   HDL 65 09/06/2015   LDLCALC 99 09/06/2015   TRIG 126 09/06/2015    Significant Diagnostic Results in last 30 days:  No results found.  Assessment/Plan Slow transit constipation stable, on prn Bisacodyl 5mg .   Overactive bladder managed on Myrbetriq 50mg  qd.   Duodenal ulcer without hemorrhage or perforation GERD/PUD/gastric erosions, stable, on Pantoprazole 40mg  bid.Hgb 15/2 12/13/20   Gait abnormality uses walker for ambulation.    Osteoarthritis, multiple sites s/p L TKR 2013,  on Norco 5/325mg  bid, Tylenol 500mg  bid.   Dementia without behavioral disturbance (HCC) on Memantine 10mg  bid, Donepezil 5mg  qd for memory.MMSE 20/30 09/10/19. TSH 1.35 09/09/20      Family/ staff Communication: plan of care reviewed with the patient and charge nurse.   Labs/tests ordered:  none  Time spend 35 minutes.

## 2021-01-09 NOTE — Assessment & Plan Note (Signed)
s/p L TKR 2013,  on Norco 5/325mg bid, Tylenol 500mg bid.     

## 2021-01-09 NOTE — Assessment & Plan Note (Signed)
on Memantine 10mg bid, Donepezil 5mg qd for memory.MMSE 20/30 09/10/19. TSH 1.35 09/09/20  

## 2021-01-09 NOTE — Assessment & Plan Note (Signed)
managed on Myrbetriq 50mg qd.  ?

## 2021-01-09 NOTE — Assessment & Plan Note (Signed)
stable, on prn Bisacodyl 5mg.  

## 2021-01-09 NOTE — Assessment & Plan Note (Signed)
uses walker for ambulation.   

## 2021-01-09 NOTE — Assessment & Plan Note (Signed)
GERD/PUD/gastric erosions, stable, on Pantoprazole 40mg bid. Hgb 15/2 12/13/20 

## 2021-01-10 DIAGNOSIS — R29898 Other symptoms and signs involving the musculoskeletal system: Secondary | ICD-10-CM | POA: Diagnosis not present

## 2021-01-10 DIAGNOSIS — R2681 Unsteadiness on feet: Secondary | ICD-10-CM | POA: Diagnosis not present

## 2021-01-10 DIAGNOSIS — M2681 Anterior soft tissue impingement: Secondary | ICD-10-CM | POA: Diagnosis not present

## 2021-01-10 DIAGNOSIS — M150 Primary generalized (osteo)arthritis: Secondary | ICD-10-CM | POA: Diagnosis not present

## 2021-01-11 ENCOUNTER — Encounter: Payer: Self-pay | Admitting: Nurse Practitioner

## 2021-01-11 DIAGNOSIS — R2681 Unsteadiness on feet: Secondary | ICD-10-CM | POA: Diagnosis not present

## 2021-01-11 DIAGNOSIS — M150 Primary generalized (osteo)arthritis: Secondary | ICD-10-CM | POA: Diagnosis not present

## 2021-01-11 DIAGNOSIS — R29898 Other symptoms and signs involving the musculoskeletal system: Secondary | ICD-10-CM | POA: Diagnosis not present

## 2021-01-11 DIAGNOSIS — M2681 Anterior soft tissue impingement: Secondary | ICD-10-CM | POA: Diagnosis not present

## 2021-01-12 DIAGNOSIS — R2681 Unsteadiness on feet: Secondary | ICD-10-CM | POA: Diagnosis not present

## 2021-01-12 DIAGNOSIS — M2681 Anterior soft tissue impingement: Secondary | ICD-10-CM | POA: Diagnosis not present

## 2021-01-12 DIAGNOSIS — R29898 Other symptoms and signs involving the musculoskeletal system: Secondary | ICD-10-CM | POA: Diagnosis not present

## 2021-01-12 DIAGNOSIS — M150 Primary generalized (osteo)arthritis: Secondary | ICD-10-CM | POA: Diagnosis not present

## 2021-01-16 DIAGNOSIS — M2681 Anterior soft tissue impingement: Secondary | ICD-10-CM | POA: Diagnosis not present

## 2021-01-16 DIAGNOSIS — R2681 Unsteadiness on feet: Secondary | ICD-10-CM | POA: Diagnosis not present

## 2021-01-16 DIAGNOSIS — R29898 Other symptoms and signs involving the musculoskeletal system: Secondary | ICD-10-CM | POA: Diagnosis not present

## 2021-01-16 DIAGNOSIS — M150 Primary generalized (osteo)arthritis: Secondary | ICD-10-CM | POA: Diagnosis not present

## 2021-01-18 DIAGNOSIS — M2681 Anterior soft tissue impingement: Secondary | ICD-10-CM | POA: Diagnosis not present

## 2021-01-18 DIAGNOSIS — M150 Primary generalized (osteo)arthritis: Secondary | ICD-10-CM | POA: Diagnosis not present

## 2021-01-18 DIAGNOSIS — R2681 Unsteadiness on feet: Secondary | ICD-10-CM | POA: Diagnosis not present

## 2021-01-18 DIAGNOSIS — R29898 Other symptoms and signs involving the musculoskeletal system: Secondary | ICD-10-CM | POA: Diagnosis not present

## 2021-01-19 DIAGNOSIS — M150 Primary generalized (osteo)arthritis: Secondary | ICD-10-CM | POA: Diagnosis not present

## 2021-01-19 DIAGNOSIS — R2681 Unsteadiness on feet: Secondary | ICD-10-CM | POA: Diagnosis not present

## 2021-01-19 DIAGNOSIS — R29898 Other symptoms and signs involving the musculoskeletal system: Secondary | ICD-10-CM | POA: Diagnosis not present

## 2021-01-19 DIAGNOSIS — M2681 Anterior soft tissue impingement: Secondary | ICD-10-CM | POA: Diagnosis not present

## 2021-01-24 DIAGNOSIS — R29898 Other symptoms and signs involving the musculoskeletal system: Secondary | ICD-10-CM | POA: Diagnosis not present

## 2021-01-24 DIAGNOSIS — Z23 Encounter for immunization: Secondary | ICD-10-CM | POA: Diagnosis not present

## 2021-01-24 DIAGNOSIS — M150 Primary generalized (osteo)arthritis: Secondary | ICD-10-CM | POA: Diagnosis not present

## 2021-01-24 DIAGNOSIS — M2681 Anterior soft tissue impingement: Secondary | ICD-10-CM | POA: Diagnosis not present

## 2021-01-24 DIAGNOSIS — R2681 Unsteadiness on feet: Secondary | ICD-10-CM | POA: Diagnosis not present

## 2021-02-01 ENCOUNTER — Other Ambulatory Visit: Payer: Self-pay | Admitting: *Deleted

## 2021-02-01 DIAGNOSIS — M159 Polyosteoarthritis, unspecified: Secondary | ICD-10-CM

## 2021-02-01 DIAGNOSIS — M150 Primary generalized (osteo)arthritis: Secondary | ICD-10-CM

## 2021-02-01 MED ORDER — HYDROCODONE-ACETAMINOPHEN 5-325 MG PO TABS: 1.0000 | ORAL_TABLET | Freq: Two times a day (BID) | ORAL | 0 refills | Status: DC

## 2021-02-01 NOTE — Telephone Encounter (Signed)
Received refill Request from [facility] and sent to [facility] for approval.

## 2021-02-09 ENCOUNTER — Non-Acute Institutional Stay (SKILLED_NURSING_FACILITY): Payer: Medicare Other | Admitting: Nurse Practitioner

## 2021-02-09 ENCOUNTER — Encounter: Payer: Self-pay | Admitting: Nurse Practitioner

## 2021-02-09 DIAGNOSIS — F0150 Vascular dementia without behavioral disturbance: Secondary | ICD-10-CM | POA: Diagnosis not present

## 2021-02-09 DIAGNOSIS — N3281 Overactive bladder: Secondary | ICD-10-CM

## 2021-02-09 DIAGNOSIS — K257 Chronic gastric ulcer without hemorrhage or perforation: Secondary | ICD-10-CM

## 2021-02-09 DIAGNOSIS — K5901 Slow transit constipation: Secondary | ICD-10-CM

## 2021-02-09 DIAGNOSIS — R269 Unspecified abnormalities of gait and mobility: Secondary | ICD-10-CM | POA: Diagnosis not present

## 2021-02-09 DIAGNOSIS — M25562 Pain in left knee: Secondary | ICD-10-CM

## 2021-02-09 DIAGNOSIS — G8929 Other chronic pain: Secondary | ICD-10-CM | POA: Diagnosis not present

## 2021-02-09 NOTE — Assessment & Plan Note (Signed)
managed on Myrbetriq 50mg  qd.

## 2021-02-09 NOTE — Assessment & Plan Note (Signed)
GERD/PUD/gastric erosions, stable, on Pantoprazole 40mg  bid. Hgb 15/2 12/13/20

## 2021-02-09 NOTE — Assessment & Plan Note (Signed)
Hx of OA, s/p L TKR 2013,  on Norco 5/325mg  bid, Tylenol 500mg  bid.

## 2021-02-09 NOTE — Assessment & Plan Note (Signed)
Stable, diet controlled  

## 2021-02-09 NOTE — Assessment & Plan Note (Signed)
on Memantine 10mg  bid, Donepezil 5mg  qd for memory. MMSE 20/30 09/10/19. TSH 1.35 09/09/20. Continue SNF [facility] for supportive care.

## 2021-02-09 NOTE — Progress Notes (Signed)
Location:   [facility] Room Number: [phone] Place of Service:  SNF (31) Provider:  Damir Leung X Trine Fread, NP  Sadao Weyer X, NP  Patient Care Team: Berlin Viereck X, NP as PCP - General (Internal Medicine) [name] Shikara Mcauliffe X, NP as Nurse Practitioner (Nurse Practitioner) [name], MD as Attending Physician (Urology) [name], MD as Consulting Physician (Gastroenterology)  Extended Emergency Contact Information Primary Emergency Contact: Weller,[name] Address: [place] [address] [name] [place] Phone: [phone] Relation: Son  Code Status:  DNR Goals of care: Advanced Directive information Advanced Directives 02/10/2021  Does Patient Have a Medical Advance Directive? Yes  Type of Advance Directive Living will;Out of facility DNR (pink MOST or yellow form);Healthcare Power of Attorney  Does patient want to make changes to medical advance directive? No - Patient declined  Copy of [place] in Chart? Yes - validated most recent copy scanned in chart (See row information)  Pre-existing out of facility DNR order (yellow form or pink MOST form) Yellow form placed in chart (order not valid for inpatient use)     Chief Complaint  Patient presents with   Medical Management of Chronic Issues    Routine follow up.    Health Maintenance    Discuss need for shingles vaccine    HPI:  Pt is a 87 y.o. female seen today for medical management of chronic diseases.     Dementia, on Memantine 10mg  bid, Donepezil 5mg  qd for memory. MMSE 20/30 09/10/19. TSH 1.35 09/09/20             Hx of OA, s/p L TKR 2013,  on Norco 5/325mg  bid, Tylenol 500mg  bid.                 Gait abnormalit, uses walker for ambulation.                    GERD/PUD/gastric erosions, stable, on Pantoprazole 40mg  bid. Hgb 15/2 12/13/20             Urinary frequency, managed on Myrbetriq 50mg  qd.              Constipation, stable      Past Medical History:  Diagnosis Date   Allergic rhinitis due to pollen 07/01/2009   Anemia, unspecified 09/07/2010   Arthritis    Benign paroxysmal positional vertigo    Candidiasis 01/01/2012   Cramp of limb 01/11/2011   Disorder of bone and cartilage, unspecified 11/10/2010   Dizziness and giddiness 11/10/2010   Insomnia, unspecified 09/07/2010   Knee pain, left anterior    Lumbago 07/01/2009   Lump or mass in breast 08/01/2012   Memory loss 01/22/2012   Myalgia and myositis, unspecified 05/17/2011   Myopathy, unspecified    Osteoarthrosis, unspecified whether generalized or localized, lower leg    Other abnormal glucose 03/23/2010   Other chest pain 08/28/2012   Other disorders of bone and cartilage(733.99) 09/19/2012   Overactive bladder    Rash    inner legs- ? med allergy- to see PCP if doesnt improve by end of week   Rash and nonspecific skin eruption 10/28/2014   Right bundle branch block    Seborrheic keratoses 05/17/2011   Spasm of muscle 01/11/2011   Spinal stenosis, lumbar region, without neurogenic claudication    Stress incontinence 07/08/2014   Unspecified constipation 12/18/2011   Unspecified tinnitus  Unspecified urinary incontinence    Urgency of urination    Urinary frequency 09/18/2012   Past Surgical History:  Procedure Laterality Date   ABDOMINAL HYSTERECTOMY     APPENDECTOMY  1956   BACK SURGERY     lumbar-- ? type  Dr Tonita Cong   BREAST LUMPECTOMY  1993   breast cyst   BREAST SURGERY     right lumpectomy   CLEFT LIP REPAIR     with repair palate   ESOPHAGOGASTRODUODENOSCOPY Left 03/10/2013   Procedure: ESOPHAGOGASTRODUODENOSCOPY (EGD);  Surgeon: Beryle Beams, MD;  Location: [facility];  Service: Endoscopy;  Laterality: Left;   EYE SURGERY     bilateral cataract extraction with IOL   TOTAL KNEE ARTHROPLASTY  12/13/2011   Procedure: TOTAL KNEE ARTHROPLASTY;  Surgeon: Johnn Hai, MD;  Location: [facility];  Service: Orthopedics;  Laterality: Left;    No  Known Allergies  Allergies as of 02/09/2021   No Known Allergies      Medication List        Accurate as of February 09, 2021 11:59 PM. If you have any questions, ask your nurse or doctor.          STOP taking these medications    PreviDent 5000 Booster Plus 1.1 % Pste Generic drug: Sodium Fluoride Stopped by: Steadman Prosperi X Sybel Standish, NP       TAKE these medications    acetaminophen 500 MG tablet Commonly known as: TYLENOL Take 500 mg by mouth 2 (two) times daily.   donepezil 5 MG tablet Commonly known as: ARICEPT Take 5 mg by mouth at bedtime.   HYDROcodone-acetaminophen 5-325 MG tablet Commonly known as: NORCO/VICODIN Take 1 tablet by mouth 2 (two) times daily. For back pain   Lidocaine 4 % Ptch Apply 4 % topically daily. Remove Lidocaine Patch every evening from left knee. 12 hours on 12 hours off   memantine 10 MG tablet Commonly known as: NAMENDA Take 10 mg by mouth 2 (two) times daily.   Myrbetriq 50 MG Tb24 tablet Generic drug: mirabegron ER Take 50 mg by mouth daily.   pantoprazole 40 MG tablet Commonly known as: Protonix Take 1 tablet (40 mg total) by mouth 2 (two) times daily.        Review of Systems  Constitutional:  Negative for fatigue, fever and unexpected weight change.  HENT:  Positive for hearing loss. Negative for congestion and voice change.   Eyes:  Negative for visual disturbance.  Respiratory:  Negative for cough.   Cardiovascular:  Negative for leg swelling.  Gastrointestinal:  Negative for abdominal pain and constipation.  Genitourinary:  Positive for frequency. Negative for dysuria and urgency.  Musculoskeletal:  Positive for arthralgias, back pain and gait problem.       Left knee pain  Skin:  Negative for color change.  Neurological:  Negative for speech difficulty, weakness and headaches.       Dementia  Psychiatric/Behavioral:  Positive for confusion. Negative for behavioral problems and sleep disturbance.    Immunization History   Administered Date(s) Administered   Influenza Whole 05/27/2012, 06/10/2013, 06/06/2018   Influenza, High Dose Seasonal PF 05/30/2019   Influenza-Unspecified 06/14/2014, 05/26/2015, 06/13/2016, 06/17/2017, 06/08/2020   Moderna Sars-Covid-2 Vaccination 08/29/2019, 09/26/2019, 07/05/2020, 01/24/2021   Pneumococcal Conjugate-13 06/24/2017   Pneumococcal Polysaccharide-23 10/03/2011   Td 10/03/2011   Zoster, Live 08/28/2007   Pertinent  Health Maintenance Due  Topic Date Due   INFLUENZA VACCINE  03/27/2021   DEXA SCAN  Completed  PNA vac Low Risk Adult  Completed   Fall Risk  03/27/2019 05/17/2017 09/15/2015 01/21/2014  Falls in the past year? (No Data) No No No  Comment Emmi Telephone Survey: data to providers prior to load - - -  Number falls in past yr: (No Data) - - -  Comment Emmi Telephone Survey Actual Response =  - - -   Functional Status Survey:    Vitals:   02/09/21 0955  BP: 132/76  Pulse: 80  Resp: 16  Temp: (!) 97.5 F (36.4 C)  SpO2: 94%  Weight: 180 lb 6.4 oz (81.8 kg)  Height: 5\' 2"  (1.575 m)   Body mass index is 33 kg/m. Physical Exam Vitals and nursing note reviewed.  Constitutional:      Appearance: Normal appearance.  HENT:     Head: Normocephalic and atraumatic.  Eyes:     Extraocular Movements: Extraocular movements intact.     Conjunctiva/sclera:     Right eye: Right conjunctiva is not injected.     Left eye: Left conjunctiva is not injected.     Pupils: Pupils are equal, round, and reactive to light.  Cardiovascular:     Rate and Rhythm: Normal rate and regular rhythm.     Heart sounds: No murmur heard. Pulmonary:     Breath sounds: No rales.  Abdominal:     General: Bowel sounds are normal.     Palpations: Abdomen is soft.     Tenderness: There is no abdominal tenderness.  Musculoskeletal:     Cervical back: Normal range of motion and neck supple.     Right lower leg: No edema.     Left lower leg: No edema.     Comments: Ambulates with  walker. Left knee pain when walking.   Skin:    General: Skin is warm and dry.  Neurological:     General: No focal deficit present.     Mental Status: She is alert. Mental status is at baseline.     Gait: Gait abnormal.     Comments: Oriented to self, her room. Ambulates with walker with SBA  Psychiatric:        Mood and Affect: Mood normal.        Behavior: Behavior normal.    Labs reviewed: Recent Labs    09/09/20 0000 12/14/20 0108  NA 142 143  K 4.2 4.3  CL 108 109*  CO2 25* 24*  BUN 18 18  CREATININE 0.9 0.7  CALCIUM 9.2 9.3   Recent Labs    09/09/20 0000 12/14/20 0108  AST 15 13  ALT 16 14  ALKPHOS 120 128*  ALBUMIN 3.5 3.6   Recent Labs    09/09/20 0000 12/14/20 0108  WBC 6.0 8.3  NEUTROABS  --  5,686.00  HGB 14.9 15.2  HCT 43 47*  PLT 253 337   Lab Results  Component Value Date   TSH 1.35 09/09/2020   Lab Results  Component Value Date   HGBA1C 5.6 04/24/2016   Lab Results  Component Value Date   CHOL 189 09/06/2015   HDL 65 09/06/2015   LDLCALC 99 09/06/2015   TRIG 126 09/06/2015    Significant Diagnostic Results in last 30 days:  No results found.  Assessment/Plan Gastric erosions GERD/PUD/gastric erosions, stable, on Pantoprazole 40mg  bid. Hgb 15/2 12/13/20  Overactive bladder managed on Myrbetriq 50mg  qd.   Slow transit constipation Stable, diet controlled.   Gait abnormality uses walker for ambulation.  Left knee pain Hx of OA, s/p L TKR 2013,  on Norco 5/325mg  bid, Tylenol 500mg  bid.    Dementia without behavioral disturbance (HCC) on Memantine 10mg  bid, Donepezil 5mg  qd for memory. MMSE 20/30 09/10/19. TSH 1.35 09/09/20. Continue SNF [facility] for supportive care.     Family/ staff Communication: plan of care reviewed with the patient and charge nurse.   Labs/tests ordered:  none  Time spend 35 minutes.

## 2021-02-09 NOTE — Assessment & Plan Note (Signed)
uses walker for ambulation.   

## 2021-02-10 ENCOUNTER — Encounter: Payer: Self-pay | Admitting: Nurse Practitioner

## 2021-02-10 ENCOUNTER — Non-Acute Institutional Stay (SKILLED_NURSING_FACILITY): Payer: Medicare Other | Admitting: Nurse Practitioner

## 2021-02-10 DIAGNOSIS — R269 Unspecified abnormalities of gait and mobility: Secondary | ICD-10-CM

## 2021-02-10 DIAGNOSIS — N3281 Overactive bladder: Secondary | ICD-10-CM

## 2021-02-10 DIAGNOSIS — U071 COVID-19: Secondary | ICD-10-CM | POA: Diagnosis not present

## 2021-02-10 DIAGNOSIS — F0150 Vascular dementia without behavioral disturbance: Secondary | ICD-10-CM | POA: Diagnosis not present

## 2021-02-10 DIAGNOSIS — M8949 Other hypertrophic osteoarthropathy, multiple sites: Secondary | ICD-10-CM | POA: Diagnosis not present

## 2021-02-10 DIAGNOSIS — K5901 Slow transit constipation: Secondary | ICD-10-CM | POA: Diagnosis not present

## 2021-02-10 DIAGNOSIS — M159 Polyosteoarthritis, unspecified: Secondary | ICD-10-CM

## 2021-02-10 DIAGNOSIS — K269 Duodenal ulcer, unspecified as acute or chronic, without hemorrhage or perforation: Secondary | ICD-10-CM

## 2021-02-10 DIAGNOSIS — I10 Essential (primary) hypertension: Secondary | ICD-10-CM | POA: Diagnosis not present

## 2021-02-10 DIAGNOSIS — M150 Primary generalized (osteo)arthritis: Secondary | ICD-10-CM

## 2021-02-10 DIAGNOSIS — R059 Cough, unspecified: Secondary | ICD-10-CM | POA: Diagnosis not present

## 2021-02-10 LAB — CBC AND DIFFERENTIAL
HCT: 47 — AB (ref 36–46)
Hemoglobin: 15.6 (ref 12.0–16.0)
Neutrophils Absolute: 6707
Platelets: 319 (ref 150–399)
WBC: 7.9

## 2021-02-10 LAB — BASIC METABOLIC PANEL
BUN: 16 (ref 4–21)
CO2: 22 (ref 13–22)
Chloride: 106 (ref 99–108)
Creatinine: 1 (ref 0.5–1.1)
Glucose: 47
Potassium: 4 (ref 3.4–5.3)
Sodium: 137 (ref 137–147)

## 2021-02-10 LAB — COMPREHENSIVE METABOLIC PANEL
Albumin: 3.9 (ref 3.5–5.0)
Calcium: 9.3 (ref 8.7–10.7)
Globulin: 3.2

## 2021-02-10 LAB — HEPATIC FUNCTION PANEL
ALT: 16 (ref 7–35)
AST: 17 (ref 13–35)
Alkaline Phosphatase: 142 — AB (ref 25–125)
Bilirubin, Total: 0.5

## 2021-02-10 LAB — CBC: RBC: 5.25 — AB (ref 3.87–5.11)

## 2021-02-10 NOTE — Assessment & Plan Note (Addendum)
Hx of OA, s/p L TKR 2013,  on Norco 5/325mg  bid(hold while on Paxlovid), Tylenol 500mg  bid.

## 2021-02-10 NOTE — Progress Notes (Signed)
This encounter was created in error - please disregard.

## 2021-02-10 NOTE — Assessment & Plan Note (Signed)
Stable

## 2021-02-10 NOTE — Progress Notes (Addendum)
Location:   [name] Room Number: [id number] Place of Service:  SNF (31) Provider:   X , NP  ,  X, NP  Patient Care Team: ,  X, NP as PCP - General (Internal Medicine) [name] ,  X, NP as Nurse Practitioner (Nurse Practitioner) Herrick, Benjamin W, MD as Attending Physician (Urology) Hung, Patrick, MD as Consulting Physician (Gastroenterology)  Extended Emergency Contact Information Primary Emergency Contact: Martello,[name] Address: [address]          [place] [address] [place] Home Phone: [phone] Relation: Son  Code Status:  DNR Goals of care: Advanced Directive information Advanced Directives 02/10/2021  Does Patient Have a Medical Advance Directive? Yes  Type of Advance Directive Living will;Out of facility DNR (pink MOST or yellow form);Healthcare Power of Attorney  Does patient want to make changes to medical advance directive? No - Patient declined  Copy of Healthcare Power of Attorney in Chart? Yes - validated most recent copy scanned in chart (See row information)  Pre-existing out of facility DNR order (yellow form or pink MOST form) Yellow form placed in chart (order not valid for inpatient use)     Chief Complaint  Patient presents with  . Acute Visit    Patient presents with a COVID infection.     HPI:  Pt is a 87 y.o. female seen today for an acute visit for nasal congestion, fatigue, malaise, cough x 1 day, tested positive COVID. The patient is afebrile, denied sore throat, chest pain, or SOB, no O2 desaturation.      Dementia, on Memantine 10mg bid, Donepezil 5mg qd for memory. MMSE 20/30 09/10/19. TSH 1.35 09/09/20             Hx of OA, s/p L TKR 2013,  on Norco 5/325mg bid, Tylenol 500mg bid.                 Gait abnormalit, uses walker for ambulation.                    GERD/PUD/gastric erosions, stable, on Pantoprazole 40mg bid. Hgb 15/2 12/13/20             Urinary  frequency, managed on Myrbetriq 50mg qd.              Constipation, stable      Past Medical History:  Diagnosis Date  . Allergic rhinitis due to pollen 07/01/2009  . Anemia, unspecified 09/07/2010  . Arthritis   . Benign paroxysmal positional vertigo   . Candidiasis 01/01/2012  . Cramp of limb 01/11/2011  . Disorder of bone and cartilage, unspecified 11/10/2010  . Dizziness and giddiness 11/10/2010  . Insomnia, unspecified 09/07/2010  . Knee pain, left anterior   . Lumbago 07/01/2009  . Lump or mass in breast 08/01/2012  . Memory loss 01/22/2012  . Myalgia and myositis, unspecified 05/17/2011  . Myopathy, unspecified   . Osteoarthrosis, unspecified whether generalized or localized, lower leg   . Other abnormal glucose 03/23/2010  . Other chest pain 08/28/2012  . Other disorders of bone and cartilage(733.99) 09/19/2012  . Overactive bladder   . Rash    inner legs- ? med allergy- to see PCP if doesnt improve by end of week  . Rash and nonspecific skin eruption 10/28/2014  . Right bundle branch block   . Seborrheic keratoses 05/17/2011  . Spasm of muscle 01/11/2011  . Spinal stenosis, lumbar region, without neurogenic claudication   .   Stress incontinence 07/08/2014  . Unspecified constipation 12/18/2011  . Unspecified tinnitus   . Unspecified urinary incontinence   . Urgency of urination   . Urinary frequency 09/18/2012   Past Surgical History:  Procedure Laterality Date  . ABDOMINAL HYSTERECTOMY    . APPENDECTOMY  1956  . BACK SURGERY     lumbar-- ? type  Dr Beane  . BREAST LUMPECTOMY  1993   breast cyst  . BREAST SURGERY     right lumpectomy  . CLEFT LIP REPAIR     with repair palate  . ESOPHAGOGASTRODUODENOSCOPY Left 03/10/2013   Procedure: ESOPHAGOGASTRODUODENOSCOPY (EGD);  Surgeon: Patrick D Hung, MD;  Location: [facility];  Service: Endoscopy;  Laterality: Left;  . EYE SURGERY     bilateral cataract extraction with IOL  . TOTAL KNEE ARTHROPLASTY  12/13/2011   Procedure: TOTAL  KNEE ARTHROPLASTY;  Surgeon: Jeffrey C Beane, MD;  Location: [facility];  Service: Orthopedics;  Laterality: Left;    No Known Allergies  Allergies as of 02/10/2021   No Known Allergies      Medication List        Accurate as of February 10, 2021 11:59 PM. If you have any questions, ask your nurse or doctor.          acetaminophen 500 MG tablet Commonly known as: TYLENOL Take 500 mg by mouth 2 (two) times daily.   donepezil 5 MG tablet Commonly known as: ARICEPT Take 5 mg by mouth at bedtime.   HYDROcodone-acetaminophen 5-325 MG tablet Commonly known as: NORCO/VICODIN Take 1 tablet by mouth 2 (two) times daily. For back pain   Lidocaine 4 % Ptch Apply 4 % topically daily. Remove Lidocaine Patch every evening from left knee. 12 hours on 12 hours off   memantine 10 MG tablet Commonly known as: NAMENDA Take 10 mg by mouth 2 (two) times daily.   Myrbetriq 50 MG Tb24 tablet Generic drug: mirabegron ER Take 50 mg by mouth daily.   pantoprazole 40 MG tablet Commonly known as: Protonix Take 1 tablet (40 mg total) by mouth 2 (two) times daily.        Review of Systems  Constitutional:  Positive for activity change, appetite change and fatigue. Negative for fever.  HENT:  Positive for congestion, hearing loss and rhinorrhea. Negative for mouth sores, sinus pressure, sinus pain, sneezing, sore throat and voice change.   Eyes:  Negative for visual disturbance.  Respiratory:  Positive for cough. Negative for chest tightness, shortness of breath and wheezing.   Cardiovascular:  Negative for leg swelling.  Gastrointestinal:  Negative for abdominal pain and constipation.  Genitourinary:  Positive for frequency. Negative for dysuria and urgency.  Musculoskeletal:  Positive for arthralgias, back pain and gait problem.       Left knee pain  Skin:  Negative for color change.  Neurological:  Negative for speech difficulty, weakness and headaches.       Dementia   Psychiatric/Behavioral:  Positive for confusion. Negative for behavioral problems and sleep disturbance.    Immunization History  Administered Date(s) Administered  . Influenza Whole 05/27/2012, 06/10/2013, 06/06/2018  . Influenza, High Dose Seasonal PF 05/30/2019  . Influenza-Unspecified 06/14/2014, 05/26/2015, 06/13/2016, 06/17/2017, 06/08/2020  . Moderna Sars-Covid-2 Vaccination 08/29/2019, 09/26/2019, 07/05/2020, 01/24/2021  . Pneumococcal Conjugate-13 06/24/2017  . Pneumococcal Polysaccharide-23 10/03/2011  . Td 10/03/2011  . Zoster, Live 08/28/2007   Pertinent  Health Maintenance Due  Topic Date Due  . INFLUENZA VACCINE  03/27/2021  . DEXA SCAN    Completed  . PNA vac Low Risk Adult  Completed   Fall Risk  03/27/2019 05/17/2017 09/15/2015 01/21/2014  Falls in the past year? (No Data) No No No  Comment Emmi Telephone Survey: data to providers prior to load - - -  Number falls in past yr: (No Data) - - -  Comment Emmi Telephone Survey Actual Response =  - - -   Functional Status Survey:    Vitals:   02/10/21 1001  BP: 132/76  Pulse: 80  Resp: 16  Temp: 97.8 F (36.6 C)  SpO2: 93%  Weight: 180 lb 6.4 oz (81.8 kg)  Height: 5' 2" (1.575 m)   Body mass index is 33 kg/m. Physical Exam Vitals and nursing note reviewed.  Constitutional:      Comments: Tired appearance.   HENT:     Head: Normocephalic and atraumatic.     Nose: Congestion and rhinorrhea present.     Mouth/Throat:     Mouth: Mucous membranes are dry.     Pharynx: No oropharyngeal exudate or posterior oropharyngeal erythema.  Eyes:     Extraocular Movements: Extraocular movements intact.     Conjunctiva/sclera:     Right eye: Right conjunctiva is not injected.     Left eye: Left conjunctiva is not injected.     Pupils: Pupils are equal, round, and reactive to light.  Cardiovascular:     Rate and Rhythm: Normal rate and regular rhythm.     Heart sounds: No murmur heard. Pulmonary:     Effort:  Pulmonary effort is normal. No respiratory distress.     Breath sounds: No wheezing, rhonchi or rales.  Chest:     Chest wall: No tenderness.  Abdominal:     General: Bowel sounds are normal.     Palpations: Abdomen is soft.     Tenderness: There is no abdominal tenderness.  Musculoskeletal:     Cervical back: Normal range of motion and neck supple.     Right lower leg: No edema.     Left lower leg: No edema.     Comments: Ambulates with walker. Left knee pain when walking.   Skin:    General: Skin is warm and dry.  Neurological:     General: No focal deficit present.     Mental Status: She is alert. Mental status is at baseline.     Gait: Gait abnormal.     Comments: Oriented to self, her room. Ambulates with walker with SBA  Psychiatric:        Mood and Affect: Mood normal.        Behavior: Behavior normal.    Labs reviewed: Recent Labs    09/09/20 0000 12/14/20 0108  NA 142 143  K 4.2 4.3  CL 108 109*  CO2 25* 24*  BUN 18 18  CREATININE 0.9 0.7  CALCIUM 9.2 9.3   Recent Labs    09/09/20 0000 12/14/20 0108  AST 15 13  ALT 16 14  ALKPHOS 120 128*  ALBUMIN 3.5 3.6   Recent Labs    09/09/20 0000 12/14/20 0108  WBC 6.0 8.3  NEUTROABS  --  5,686.00  HGB 14.9 15.2  HCT 43 47*  PLT 253 337   Lab Results  Component Value Date   TSH 1.35 09/09/2020   Lab Results  Component Value Date   HGBA1C 5.6 04/24/2016   Lab Results  Component Value Date   CHOL 189 09/06/2015   HDL 65 09/06/2015   LDLCALC 99   09/06/2015   TRIG 126 09/06/2015    Significant Diagnostic Results in last 30 days:  No results found.  Assessment/Plan COVID-19 virus infection nasal congestion, fatigue, malaise, cough x 1 day, tested positive COVID. The patient is afebrile, denied sore throat, chest pain, or SOB, no O2 desaturation. Will treat with Paxlovid 100/150mg bid x5 days. Obtain CXR ap/lateral, CBC/diff, CMP/eGFR.  02/10/21 wbc 7.9, Hgb 15.6, plt 319, neutrophils 84.9%, Na  137, K 4.0, Bun 16, creat 0.95, eGFR 53 02/10/21 CXR no acute cardiopulmonary disease.   Dementia without behavioral disturbance (HCC) on Memantine 10mg bid, Donepezil 5mg qd for memory. MMSE 20/30 09/10/19. TSH 1.35 09/09/20  Osteoarthritis, multiple sites Hx of OA, s/p L TKR 2013,  on Norco 5/325mg bid(hold while on Paxlovid), Tylenol 500mg bid.    Gait abnormality Gait abnormalit, uses walker for ambulation usually, too weak to walk today, no focal weakness noted.   Duodenal ulcer without hemorrhage or perforation GERD/PUD/gastric erosions, stable, on Pantoprazole 40mg bid. Hgb 15/2 12/13/20  Overactive bladder Urinary frequency, managed on Myrbetriq 50mg qd.   Slow transit constipation Stable.     Family/ staff Communication: plan of care reviewed with the patient and charge nurse.   Labs/tests ordered:   CBC/diff, CMP/eGFR, CXR ap/lateral.   Time spend 35 minutes.  

## 2021-02-10 NOTE — Assessment & Plan Note (Signed)
on Memantine 10mg  bid, Donepezil 5mg  qd for memory. MMSE 20/30 09/10/19. TSH 1.35 09/09/20

## 2021-02-10 NOTE — Assessment & Plan Note (Addendum)
nasal congestion, fatigue, malaise, cough x 1 day, tested positive COVID. The patient is afebrile, denied sore throat, chest pain, or SOB, no O2 desaturation. Will treat with Paxlovid 100/$RemoveBeforeDEI'150mg'[id number]$  bid x5 days. Obtain CXR ap/lateral, CBC/diff, CMP/eGFR.  02/10/21 wbc 7.9, Hgb 15.6, plt 319, neutrophils 84.9%, Na 137, K 4.0, Bun 16, creat 0.95, eGFR 53 02/10/21 CXR no acute cardiopulmonary disease.

## 2021-02-10 NOTE — Assessment & Plan Note (Signed)
GERD/PUD/gastric erosions, stable, on Pantoprazole 40mg  bid. Hgb 15/2 12/13/20

## 2021-02-10 NOTE — Assessment & Plan Note (Signed)
Urinary frequency, managed on Myrbetriq 50mg  qd.

## 2021-02-10 NOTE — Assessment & Plan Note (Signed)
Gait abnormalit, uses walker for ambulation usually, too weak to walk today, no focal weakness noted.

## 2021-02-23 DIAGNOSIS — M25561 Pain in right knee: Secondary | ICD-10-CM | POA: Diagnosis not present

## 2021-02-23 DIAGNOSIS — M8949 Other hypertrophic osteoarthropathy, multiple sites: Secondary | ICD-10-CM | POA: Diagnosis not present

## 2021-02-23 DIAGNOSIS — F0150 Vascular dementia without behavioral disturbance: Secondary | ICD-10-CM | POA: Diagnosis not present

## 2021-02-23 DIAGNOSIS — K269 Duodenal ulcer, unspecified as acute or chronic, without hemorrhage or perforation: Secondary | ICD-10-CM | POA: Diagnosis not present

## 2021-02-23 DIAGNOSIS — R2681 Unsteadiness on feet: Secondary | ICD-10-CM | POA: Diagnosis not present

## 2021-02-23 DIAGNOSIS — K5900 Constipation, unspecified: Secondary | ICD-10-CM | POA: Diagnosis not present

## 2021-02-23 DIAGNOSIS — M25661 Stiffness of right knee, not elsewhere classified: Secondary | ICD-10-CM | POA: Diagnosis not present

## 2021-02-24 DIAGNOSIS — M25661 Stiffness of right knee, not elsewhere classified: Secondary | ICD-10-CM | POA: Diagnosis not present

## 2021-02-24 DIAGNOSIS — M6281 Muscle weakness (generalized): Secondary | ICD-10-CM | POA: Diagnosis not present

## 2021-02-24 DIAGNOSIS — Z8616 Personal history of COVID-19: Secondary | ICD-10-CM | POA: Diagnosis not present

## 2021-02-24 DIAGNOSIS — R2681 Unsteadiness on feet: Secondary | ICD-10-CM | POA: Diagnosis not present

## 2021-02-24 DIAGNOSIS — M25561 Pain in right knee: Secondary | ICD-10-CM | POA: Diagnosis not present

## 2021-02-24 DIAGNOSIS — R29898 Other symptoms and signs involving the musculoskeletal system: Secondary | ICD-10-CM | POA: Diagnosis not present

## 2021-02-27 DIAGNOSIS — Z8616 Personal history of COVID-19: Secondary | ICD-10-CM | POA: Diagnosis not present

## 2021-02-27 DIAGNOSIS — M6281 Muscle weakness (generalized): Secondary | ICD-10-CM | POA: Diagnosis not present

## 2021-02-27 DIAGNOSIS — M25661 Stiffness of right knee, not elsewhere classified: Secondary | ICD-10-CM | POA: Diagnosis not present

## 2021-02-27 DIAGNOSIS — M25561 Pain in right knee: Secondary | ICD-10-CM | POA: Diagnosis not present

## 2021-02-27 DIAGNOSIS — R29898 Other symptoms and signs involving the musculoskeletal system: Secondary | ICD-10-CM | POA: Diagnosis not present

## 2021-02-27 DIAGNOSIS — R2681 Unsteadiness on feet: Secondary | ICD-10-CM | POA: Diagnosis not present

## 2021-03-02 DIAGNOSIS — M25561 Pain in right knee: Secondary | ICD-10-CM | POA: Diagnosis not present

## 2021-03-02 DIAGNOSIS — M6281 Muscle weakness (generalized): Secondary | ICD-10-CM | POA: Diagnosis not present

## 2021-03-02 DIAGNOSIS — Z8616 Personal history of COVID-19: Secondary | ICD-10-CM | POA: Diagnosis not present

## 2021-03-02 DIAGNOSIS — R29898 Other symptoms and signs involving the musculoskeletal system: Secondary | ICD-10-CM | POA: Diagnosis not present

## 2021-03-02 DIAGNOSIS — M25661 Stiffness of right knee, not elsewhere classified: Secondary | ICD-10-CM | POA: Diagnosis not present

## 2021-03-02 DIAGNOSIS — R2681 Unsteadiness on feet: Secondary | ICD-10-CM | POA: Diagnosis not present

## 2021-03-04 DIAGNOSIS — R2681 Unsteadiness on feet: Secondary | ICD-10-CM | POA: Diagnosis not present

## 2021-03-04 DIAGNOSIS — M6281 Muscle weakness (generalized): Secondary | ICD-10-CM | POA: Diagnosis not present

## 2021-03-04 DIAGNOSIS — Z8616 Personal history of COVID-19: Secondary | ICD-10-CM | POA: Diagnosis not present

## 2021-03-04 DIAGNOSIS — M25661 Stiffness of right knee, not elsewhere classified: Secondary | ICD-10-CM | POA: Diagnosis not present

## 2021-03-04 DIAGNOSIS — M25561 Pain in right knee: Secondary | ICD-10-CM | POA: Diagnosis not present

## 2021-03-04 DIAGNOSIS — R29898 Other symptoms and signs involving the musculoskeletal system: Secondary | ICD-10-CM | POA: Diagnosis not present

## 2021-03-05 DIAGNOSIS — R2681 Unsteadiness on feet: Secondary | ICD-10-CM | POA: Diagnosis not present

## 2021-03-05 DIAGNOSIS — M25661 Stiffness of right knee, not elsewhere classified: Secondary | ICD-10-CM | POA: Diagnosis not present

## 2021-03-05 DIAGNOSIS — Z8616 Personal history of COVID-19: Secondary | ICD-10-CM | POA: Diagnosis not present

## 2021-03-05 DIAGNOSIS — R29898 Other symptoms and signs involving the musculoskeletal system: Secondary | ICD-10-CM | POA: Diagnosis not present

## 2021-03-05 DIAGNOSIS — M25561 Pain in right knee: Secondary | ICD-10-CM | POA: Diagnosis not present

## 2021-03-05 DIAGNOSIS — M6281 Muscle weakness (generalized): Secondary | ICD-10-CM | POA: Diagnosis not present

## 2021-03-07 DIAGNOSIS — M6281 Muscle weakness (generalized): Secondary | ICD-10-CM | POA: Diagnosis not present

## 2021-03-07 DIAGNOSIS — R2681 Unsteadiness on feet: Secondary | ICD-10-CM | POA: Diagnosis not present

## 2021-03-07 DIAGNOSIS — R29898 Other symptoms and signs involving the musculoskeletal system: Secondary | ICD-10-CM | POA: Diagnosis not present

## 2021-03-07 DIAGNOSIS — M25561 Pain in right knee: Secondary | ICD-10-CM | POA: Diagnosis not present

## 2021-03-07 DIAGNOSIS — M25661 Stiffness of right knee, not elsewhere classified: Secondary | ICD-10-CM | POA: Diagnosis not present

## 2021-03-07 DIAGNOSIS — Z8616 Personal history of COVID-19: Secondary | ICD-10-CM | POA: Diagnosis not present

## 2021-03-08 DIAGNOSIS — M25661 Stiffness of right knee, not elsewhere classified: Secondary | ICD-10-CM | POA: Diagnosis not present

## 2021-03-08 DIAGNOSIS — Z8616 Personal history of COVID-19: Secondary | ICD-10-CM | POA: Diagnosis not present

## 2021-03-08 DIAGNOSIS — M6281 Muscle weakness (generalized): Secondary | ICD-10-CM | POA: Diagnosis not present

## 2021-03-08 DIAGNOSIS — M25561 Pain in right knee: Secondary | ICD-10-CM | POA: Diagnosis not present

## 2021-03-08 DIAGNOSIS — R29898 Other symptoms and signs involving the musculoskeletal system: Secondary | ICD-10-CM | POA: Diagnosis not present

## 2021-03-08 DIAGNOSIS — R2681 Unsteadiness on feet: Secondary | ICD-10-CM | POA: Diagnosis not present

## 2021-03-09 DIAGNOSIS — Z8616 Personal history of COVID-19: Secondary | ICD-10-CM | POA: Diagnosis not present

## 2021-03-09 DIAGNOSIS — M25561 Pain in right knee: Secondary | ICD-10-CM | POA: Diagnosis not present

## 2021-03-09 DIAGNOSIS — R2681 Unsteadiness on feet: Secondary | ICD-10-CM | POA: Diagnosis not present

## 2021-03-09 DIAGNOSIS — R29898 Other symptoms and signs involving the musculoskeletal system: Secondary | ICD-10-CM | POA: Diagnosis not present

## 2021-03-09 DIAGNOSIS — M6281 Muscle weakness (generalized): Secondary | ICD-10-CM | POA: Diagnosis not present

## 2021-03-09 DIAGNOSIS — M25661 Stiffness of right knee, not elsewhere classified: Secondary | ICD-10-CM | POA: Diagnosis not present

## 2021-03-10 DIAGNOSIS — M25661 Stiffness of right knee, not elsewhere classified: Secondary | ICD-10-CM | POA: Diagnosis not present

## 2021-03-10 DIAGNOSIS — M6281 Muscle weakness (generalized): Secondary | ICD-10-CM | POA: Diagnosis not present

## 2021-03-10 DIAGNOSIS — R2681 Unsteadiness on feet: Secondary | ICD-10-CM | POA: Diagnosis not present

## 2021-03-10 DIAGNOSIS — Z8616 Personal history of COVID-19: Secondary | ICD-10-CM | POA: Diagnosis not present

## 2021-03-10 DIAGNOSIS — M25561 Pain in right knee: Secondary | ICD-10-CM | POA: Diagnosis not present

## 2021-03-10 DIAGNOSIS — R29898 Other symptoms and signs involving the musculoskeletal system: Secondary | ICD-10-CM | POA: Diagnosis not present

## 2021-03-12 DIAGNOSIS — Z8616 Personal history of COVID-19: Secondary | ICD-10-CM | POA: Diagnosis not present

## 2021-03-12 DIAGNOSIS — M25661 Stiffness of right knee, not elsewhere classified: Secondary | ICD-10-CM | POA: Diagnosis not present

## 2021-03-12 DIAGNOSIS — R2681 Unsteadiness on feet: Secondary | ICD-10-CM | POA: Diagnosis not present

## 2021-03-12 DIAGNOSIS — M25561 Pain in right knee: Secondary | ICD-10-CM | POA: Diagnosis not present

## 2021-03-12 DIAGNOSIS — M6281 Muscle weakness (generalized): Secondary | ICD-10-CM | POA: Diagnosis not present

## 2021-03-12 DIAGNOSIS — R29898 Other symptoms and signs involving the musculoskeletal system: Secondary | ICD-10-CM | POA: Diagnosis not present

## 2021-03-14 DIAGNOSIS — M6281 Muscle weakness (generalized): Secondary | ICD-10-CM | POA: Diagnosis not present

## 2021-03-14 DIAGNOSIS — Z8616 Personal history of COVID-19: Secondary | ICD-10-CM | POA: Diagnosis not present

## 2021-03-14 DIAGNOSIS — M25661 Stiffness of right knee, not elsewhere classified: Secondary | ICD-10-CM | POA: Diagnosis not present

## 2021-03-14 DIAGNOSIS — R29898 Other symptoms and signs involving the musculoskeletal system: Secondary | ICD-10-CM | POA: Diagnosis not present

## 2021-03-14 DIAGNOSIS — M25561 Pain in right knee: Secondary | ICD-10-CM | POA: Diagnosis not present

## 2021-03-14 DIAGNOSIS — R2681 Unsteadiness on feet: Secondary | ICD-10-CM | POA: Diagnosis not present

## 2021-03-15 DIAGNOSIS — R29898 Other symptoms and signs involving the musculoskeletal system: Secondary | ICD-10-CM | POA: Diagnosis not present

## 2021-03-15 DIAGNOSIS — M6281 Muscle weakness (generalized): Secondary | ICD-10-CM | POA: Diagnosis not present

## 2021-03-15 DIAGNOSIS — M25561 Pain in right knee: Secondary | ICD-10-CM | POA: Diagnosis not present

## 2021-03-15 DIAGNOSIS — R2681 Unsteadiness on feet: Secondary | ICD-10-CM | POA: Diagnosis not present

## 2021-03-15 DIAGNOSIS — M25661 Stiffness of right knee, not elsewhere classified: Secondary | ICD-10-CM | POA: Diagnosis not present

## 2021-03-15 DIAGNOSIS — Z8616 Personal history of COVID-19: Secondary | ICD-10-CM | POA: Diagnosis not present

## 2021-03-16 DIAGNOSIS — M6281 Muscle weakness (generalized): Secondary | ICD-10-CM | POA: Diagnosis not present

## 2021-03-16 DIAGNOSIS — M25661 Stiffness of right knee, not elsewhere classified: Secondary | ICD-10-CM | POA: Diagnosis not present

## 2021-03-16 DIAGNOSIS — R2681 Unsteadiness on feet: Secondary | ICD-10-CM | POA: Diagnosis not present

## 2021-03-16 DIAGNOSIS — R29898 Other symptoms and signs involving the musculoskeletal system: Secondary | ICD-10-CM | POA: Diagnosis not present

## 2021-03-16 DIAGNOSIS — M25561 Pain in right knee: Secondary | ICD-10-CM | POA: Diagnosis not present

## 2021-03-16 DIAGNOSIS — Z8616 Personal history of COVID-19: Secondary | ICD-10-CM | POA: Diagnosis not present

## 2021-03-17 DIAGNOSIS — R2681 Unsteadiness on feet: Secondary | ICD-10-CM | POA: Diagnosis not present

## 2021-03-17 DIAGNOSIS — M6281 Muscle weakness (generalized): Secondary | ICD-10-CM | POA: Diagnosis not present

## 2021-03-17 DIAGNOSIS — R29898 Other symptoms and signs involving the musculoskeletal system: Secondary | ICD-10-CM | POA: Diagnosis not present

## 2021-03-17 DIAGNOSIS — M25661 Stiffness of right knee, not elsewhere classified: Secondary | ICD-10-CM | POA: Diagnosis not present

## 2021-03-17 DIAGNOSIS — M25561 Pain in right knee: Secondary | ICD-10-CM | POA: Diagnosis not present

## 2021-03-17 DIAGNOSIS — Z8616 Personal history of COVID-19: Secondary | ICD-10-CM | POA: Diagnosis not present

## 2021-03-20 DIAGNOSIS — M25561 Pain in right knee: Secondary | ICD-10-CM | POA: Diagnosis not present

## 2021-03-20 DIAGNOSIS — Z8616 Personal history of COVID-19: Secondary | ICD-10-CM | POA: Diagnosis not present

## 2021-03-20 DIAGNOSIS — R29898 Other symptoms and signs involving the musculoskeletal system: Secondary | ICD-10-CM | POA: Diagnosis not present

## 2021-03-20 DIAGNOSIS — M6281 Muscle weakness (generalized): Secondary | ICD-10-CM | POA: Diagnosis not present

## 2021-03-20 DIAGNOSIS — R2681 Unsteadiness on feet: Secondary | ICD-10-CM | POA: Diagnosis not present

## 2021-03-20 DIAGNOSIS — M25661 Stiffness of right knee, not elsewhere classified: Secondary | ICD-10-CM | POA: Diagnosis not present

## 2021-03-21 DIAGNOSIS — R29898 Other symptoms and signs involving the musculoskeletal system: Secondary | ICD-10-CM | POA: Diagnosis not present

## 2021-03-21 DIAGNOSIS — M25561 Pain in right knee: Secondary | ICD-10-CM | POA: Diagnosis not present

## 2021-03-21 DIAGNOSIS — M25661 Stiffness of right knee, not elsewhere classified: Secondary | ICD-10-CM | POA: Diagnosis not present

## 2021-03-21 DIAGNOSIS — M6281 Muscle weakness (generalized): Secondary | ICD-10-CM | POA: Diagnosis not present

## 2021-03-21 DIAGNOSIS — R2681 Unsteadiness on feet: Secondary | ICD-10-CM | POA: Diagnosis not present

## 2021-03-21 DIAGNOSIS — Z8616 Personal history of COVID-19: Secondary | ICD-10-CM | POA: Diagnosis not present

## 2021-03-22 DIAGNOSIS — R2681 Unsteadiness on feet: Secondary | ICD-10-CM | POA: Diagnosis not present

## 2021-03-22 DIAGNOSIS — M6281 Muscle weakness (generalized): Secondary | ICD-10-CM | POA: Diagnosis not present

## 2021-03-22 DIAGNOSIS — M25561 Pain in right knee: Secondary | ICD-10-CM | POA: Diagnosis not present

## 2021-03-22 DIAGNOSIS — R29898 Other symptoms and signs involving the musculoskeletal system: Secondary | ICD-10-CM | POA: Diagnosis not present

## 2021-03-22 DIAGNOSIS — Z8616 Personal history of COVID-19: Secondary | ICD-10-CM | POA: Diagnosis not present

## 2021-03-22 DIAGNOSIS — M25661 Stiffness of right knee, not elsewhere classified: Secondary | ICD-10-CM | POA: Diagnosis not present

## 2021-03-23 DIAGNOSIS — M25661 Stiffness of right knee, not elsewhere classified: Secondary | ICD-10-CM | POA: Diagnosis not present

## 2021-03-23 DIAGNOSIS — M25561 Pain in right knee: Secondary | ICD-10-CM | POA: Diagnosis not present

## 2021-03-23 DIAGNOSIS — R29898 Other symptoms and signs involving the musculoskeletal system: Secondary | ICD-10-CM | POA: Diagnosis not present

## 2021-03-23 DIAGNOSIS — R2681 Unsteadiness on feet: Secondary | ICD-10-CM | POA: Diagnosis not present

## 2021-03-23 DIAGNOSIS — Z8616 Personal history of COVID-19: Secondary | ICD-10-CM | POA: Diagnosis not present

## 2021-03-23 DIAGNOSIS — M6281 Muscle weakness (generalized): Secondary | ICD-10-CM | POA: Diagnosis not present

## 2021-03-25 DIAGNOSIS — M6281 Muscle weakness (generalized): Secondary | ICD-10-CM | POA: Diagnosis not present

## 2021-03-25 DIAGNOSIS — M25561 Pain in right knee: Secondary | ICD-10-CM | POA: Diagnosis not present

## 2021-03-25 DIAGNOSIS — Z8616 Personal history of COVID-19: Secondary | ICD-10-CM | POA: Diagnosis not present

## 2021-03-25 DIAGNOSIS — R29898 Other symptoms and signs involving the musculoskeletal system: Secondary | ICD-10-CM | POA: Diagnosis not present

## 2021-03-25 DIAGNOSIS — M25661 Stiffness of right knee, not elsewhere classified: Secondary | ICD-10-CM | POA: Diagnosis not present

## 2021-03-25 DIAGNOSIS — R2681 Unsteadiness on feet: Secondary | ICD-10-CM | POA: Diagnosis not present

## 2021-03-27 DIAGNOSIS — M6281 Muscle weakness (generalized): Secondary | ICD-10-CM | POA: Diagnosis not present

## 2021-03-27 DIAGNOSIS — M25561 Pain in right knee: Secondary | ICD-10-CM | POA: Diagnosis not present

## 2021-03-27 DIAGNOSIS — M25661 Stiffness of right knee, not elsewhere classified: Secondary | ICD-10-CM | POA: Diagnosis not present

## 2021-03-27 DIAGNOSIS — Z8616 Personal history of COVID-19: Secondary | ICD-10-CM | POA: Diagnosis not present

## 2021-03-27 DIAGNOSIS — R29898 Other symptoms and signs involving the musculoskeletal system: Secondary | ICD-10-CM | POA: Diagnosis not present

## 2021-03-27 DIAGNOSIS — R41841 Cognitive communication deficit: Secondary | ICD-10-CM | POA: Diagnosis not present

## 2021-03-27 DIAGNOSIS — R2681 Unsteadiness on feet: Secondary | ICD-10-CM | POA: Diagnosis not present

## 2021-03-28 DIAGNOSIS — Z8616 Personal history of COVID-19: Secondary | ICD-10-CM | POA: Diagnosis not present

## 2021-03-28 DIAGNOSIS — M25561 Pain in right knee: Secondary | ICD-10-CM | POA: Diagnosis not present

## 2021-03-28 DIAGNOSIS — M25661 Stiffness of right knee, not elsewhere classified: Secondary | ICD-10-CM | POA: Diagnosis not present

## 2021-03-28 DIAGNOSIS — R2681 Unsteadiness on feet: Secondary | ICD-10-CM | POA: Diagnosis not present

## 2021-03-28 DIAGNOSIS — R41841 Cognitive communication deficit: Secondary | ICD-10-CM | POA: Diagnosis not present

## 2021-03-28 DIAGNOSIS — M6281 Muscle weakness (generalized): Secondary | ICD-10-CM | POA: Diagnosis not present

## 2021-03-30 DIAGNOSIS — M6281 Muscle weakness (generalized): Secondary | ICD-10-CM | POA: Diagnosis not present

## 2021-03-30 DIAGNOSIS — R41841 Cognitive communication deficit: Secondary | ICD-10-CM | POA: Diagnosis not present

## 2021-03-30 DIAGNOSIS — M25661 Stiffness of right knee, not elsewhere classified: Secondary | ICD-10-CM | POA: Diagnosis not present

## 2021-03-30 DIAGNOSIS — R2681 Unsteadiness on feet: Secondary | ICD-10-CM | POA: Diagnosis not present

## 2021-03-30 DIAGNOSIS — M25561 Pain in right knee: Secondary | ICD-10-CM | POA: Diagnosis not present

## 2021-03-30 DIAGNOSIS — Z8616 Personal history of COVID-19: Secondary | ICD-10-CM | POA: Diagnosis not present

## 2021-04-01 DIAGNOSIS — M25661 Stiffness of right knee, not elsewhere classified: Secondary | ICD-10-CM | POA: Diagnosis not present

## 2021-04-01 DIAGNOSIS — Z8616 Personal history of COVID-19: Secondary | ICD-10-CM | POA: Diagnosis not present

## 2021-04-01 DIAGNOSIS — M25561 Pain in right knee: Secondary | ICD-10-CM | POA: Diagnosis not present

## 2021-04-01 DIAGNOSIS — R2681 Unsteadiness on feet: Secondary | ICD-10-CM | POA: Diagnosis not present

## 2021-04-01 DIAGNOSIS — M6281 Muscle weakness (generalized): Secondary | ICD-10-CM | POA: Diagnosis not present

## 2021-04-01 DIAGNOSIS — R41841 Cognitive communication deficit: Secondary | ICD-10-CM | POA: Diagnosis not present

## 2021-04-03 DIAGNOSIS — M25561 Pain in right knee: Secondary | ICD-10-CM | POA: Diagnosis not present

## 2021-04-03 DIAGNOSIS — M25661 Stiffness of right knee, not elsewhere classified: Secondary | ICD-10-CM | POA: Diagnosis not present

## 2021-04-03 DIAGNOSIS — R41841 Cognitive communication deficit: Secondary | ICD-10-CM | POA: Diagnosis not present

## 2021-04-03 DIAGNOSIS — Z8616 Personal history of COVID-19: Secondary | ICD-10-CM | POA: Diagnosis not present

## 2021-04-03 DIAGNOSIS — M6281 Muscle weakness (generalized): Secondary | ICD-10-CM | POA: Diagnosis not present

## 2021-04-03 DIAGNOSIS — R2681 Unsteadiness on feet: Secondary | ICD-10-CM | POA: Diagnosis not present

## 2021-04-04 DIAGNOSIS — M25561 Pain in right knee: Secondary | ICD-10-CM | POA: Diagnosis not present

## 2021-04-04 DIAGNOSIS — R2681 Unsteadiness on feet: Secondary | ICD-10-CM | POA: Diagnosis not present

## 2021-04-04 DIAGNOSIS — M6281 Muscle weakness (generalized): Secondary | ICD-10-CM | POA: Diagnosis not present

## 2021-04-04 DIAGNOSIS — Z8616 Personal history of COVID-19: Secondary | ICD-10-CM | POA: Diagnosis not present

## 2021-04-04 DIAGNOSIS — R41841 Cognitive communication deficit: Secondary | ICD-10-CM | POA: Diagnosis not present

## 2021-04-04 DIAGNOSIS — M25661 Stiffness of right knee, not elsewhere classified: Secondary | ICD-10-CM | POA: Diagnosis not present

## 2021-04-06 DIAGNOSIS — M6281 Muscle weakness (generalized): Secondary | ICD-10-CM | POA: Diagnosis not present

## 2021-04-06 DIAGNOSIS — M25561 Pain in right knee: Secondary | ICD-10-CM | POA: Diagnosis not present

## 2021-04-06 DIAGNOSIS — R41841 Cognitive communication deficit: Secondary | ICD-10-CM | POA: Diagnosis not present

## 2021-04-06 DIAGNOSIS — M25661 Stiffness of right knee, not elsewhere classified: Secondary | ICD-10-CM | POA: Diagnosis not present

## 2021-04-06 DIAGNOSIS — R2681 Unsteadiness on feet: Secondary | ICD-10-CM | POA: Diagnosis not present

## 2021-04-06 DIAGNOSIS — Z8616 Personal history of COVID-19: Secondary | ICD-10-CM | POA: Diagnosis not present

## 2021-04-08 DIAGNOSIS — R41841 Cognitive communication deficit: Secondary | ICD-10-CM | POA: Diagnosis not present

## 2021-04-08 DIAGNOSIS — Z8616 Personal history of COVID-19: Secondary | ICD-10-CM | POA: Diagnosis not present

## 2021-04-08 DIAGNOSIS — M25561 Pain in right knee: Secondary | ICD-10-CM | POA: Diagnosis not present

## 2021-04-08 DIAGNOSIS — M6281 Muscle weakness (generalized): Secondary | ICD-10-CM | POA: Diagnosis not present

## 2021-04-08 DIAGNOSIS — M25661 Stiffness of right knee, not elsewhere classified: Secondary | ICD-10-CM | POA: Diagnosis not present

## 2021-04-08 DIAGNOSIS — R2681 Unsteadiness on feet: Secondary | ICD-10-CM | POA: Diagnosis not present

## 2021-04-09 DIAGNOSIS — M25661 Stiffness of right knee, not elsewhere classified: Secondary | ICD-10-CM | POA: Diagnosis not present

## 2021-04-09 DIAGNOSIS — M25561 Pain in right knee: Secondary | ICD-10-CM | POA: Diagnosis not present

## 2021-04-09 DIAGNOSIS — M6281 Muscle weakness (generalized): Secondary | ICD-10-CM | POA: Diagnosis not present

## 2021-04-09 DIAGNOSIS — R2681 Unsteadiness on feet: Secondary | ICD-10-CM | POA: Diagnosis not present

## 2021-04-09 DIAGNOSIS — R41841 Cognitive communication deficit: Secondary | ICD-10-CM | POA: Diagnosis not present

## 2021-04-09 DIAGNOSIS — Z8616 Personal history of COVID-19: Secondary | ICD-10-CM | POA: Diagnosis not present

## 2021-04-10 DIAGNOSIS — M25661 Stiffness of right knee, not elsewhere classified: Secondary | ICD-10-CM | POA: Diagnosis not present

## 2021-04-10 DIAGNOSIS — M25561 Pain in right knee: Secondary | ICD-10-CM | POA: Diagnosis not present

## 2021-04-10 DIAGNOSIS — Z8616 Personal history of COVID-19: Secondary | ICD-10-CM | POA: Diagnosis not present

## 2021-04-10 DIAGNOSIS — R41841 Cognitive communication deficit: Secondary | ICD-10-CM | POA: Diagnosis not present

## 2021-04-10 DIAGNOSIS — R2681 Unsteadiness on feet: Secondary | ICD-10-CM | POA: Diagnosis not present

## 2021-04-10 DIAGNOSIS — M6281 Muscle weakness (generalized): Secondary | ICD-10-CM | POA: Diagnosis not present

## 2021-04-11 ENCOUNTER — Non-Acute Institutional Stay (SKILLED_NURSING_FACILITY): Payer: Medicare Other | Admitting: Internal Medicine

## 2021-04-11 ENCOUNTER — Encounter: Payer: Self-pay | Admitting: Internal Medicine

## 2021-04-11 DIAGNOSIS — F0150 Vascular dementia without behavioral disturbance: Secondary | ICD-10-CM

## 2021-04-11 DIAGNOSIS — K269 Duodenal ulcer, unspecified as acute or chronic, without hemorrhage or perforation: Secondary | ICD-10-CM | POA: Diagnosis not present

## 2021-04-11 DIAGNOSIS — U099 Post covid-19 condition, unspecified: Secondary | ICD-10-CM

## 2021-04-11 DIAGNOSIS — R634 Abnormal weight loss: Secondary | ICD-10-CM

## 2021-04-11 DIAGNOSIS — M8949 Other hypertrophic osteoarthropathy, multiple sites: Secondary | ICD-10-CM | POA: Diagnosis not present

## 2021-04-11 DIAGNOSIS — N3281 Overactive bladder: Secondary | ICD-10-CM

## 2021-04-11 DIAGNOSIS — M159 Polyosteoarthritis, unspecified: Secondary | ICD-10-CM

## 2021-04-11 NOTE — Progress Notes (Signed)
Location:   [facility] Room Number: 51 Place of Service:  SNF (31) Provider:  Veleta Miners MD  Mast, Man X, NP  Patient Care Team: Mast, Man X, NP as PCP - General (Internal Medicine) [name] Mast, Man X, NP as Nurse Practitioner (Nurse Practitioner) [name], MD as Attending Physician (Urology) [name], MD as Consulting Physician (Gastroenterology)  Extended Emergency Contact Information Primary Emergency Contact: Capes,[name] Address: [place] [address] [name] of [place] Phone: [phone] Relation: Son  Code Status:  DNR Goals of care: Advanced Directive information Advanced Directives 04/11/2021  Does Patient Have a Medical Advance Directive? Yes  Type of Advance Directive Living will;Out of facility DNR (pink MOST or yellow form);Healthcare Power of Attorney  Does patient want to make changes to medical advance directive? No - Patient declined  Copy of [place] in Chart? Yes - validated most recent copy scanned in chart (See row information)  Pre-existing out of facility DNR order (yellow form or pink MOST form) Yellow form placed in chart (order not valid for inpatient use)     Chief Complaint  Patient presents with   Medical Management of Chronic Issues   Quality Metric Gaps    Shingrix, Flu shot    HPI:  Pt is a 87 y.o. female seen today for medical management of chronic diseases.    She has h/o Lumbago, Stress incontinence, Vertigo, h/o Duodenal Ulcer and dementia and Arthritis  Recovered from Covid Has lost weight almost 10 lbs ? Due to Covid. Seems to be stable otherwise No Falls.Mostly stays in her wheel chair Walks with Walker and Mild Assist No New nursing issues   Past Medical History:  Diagnosis Date   Allergic rhinitis due to pollen 07/01/2009   Anemia, unspecified 09/07/2010   Arthritis    Benign paroxysmal positional vertigo     Candidiasis 01/01/2012   Cramp of limb 01/11/2011   Disorder of bone and cartilage, unspecified 11/10/2010   Dizziness and giddiness 11/10/2010   Insomnia, unspecified 09/07/2010   Knee pain, left anterior    Lumbago 07/01/2009   Lump or mass in breast 08/01/2012   Memory loss 01/22/2012   Myalgia and myositis, unspecified 05/17/2011   Myopathy, unspecified    Osteoarthrosis, unspecified whether generalized or localized, lower leg    Other abnormal glucose 03/23/2010   Other chest pain 08/28/2012   Other disorders of bone and cartilage(733.99) 09/19/2012   Overactive bladder    Rash    inner legs- ? med allergy- to see PCP if doesnt improve by end of week   Rash and nonspecific skin eruption 10/28/2014   Right bundle branch block    Seborrheic keratoses 05/17/2011   Spasm of muscle 01/11/2011   Spinal stenosis, lumbar region, without neurogenic claudication    Stress incontinence 07/08/2014   Unspecified constipation 12/18/2011   Unspecified tinnitus    Unspecified urinary incontinence    Urgency of urination    Urinary frequency 09/18/2012   Past Surgical History:  Procedure Laterality Date   ABDOMINAL HYSTERECTOMY     APPENDECTOMY  1956   BACK SURGERY     lumbar-- ? type  Dr Tonita Cong   BREAST LUMPECTOMY  1993   breast cyst   BREAST SURGERY     right lumpectomy   CLEFT LIP REPAIR     with repair palate   ESOPHAGOGASTRODUODENOSCOPY Left 03/10/2013  Procedure: ESOPHAGOGASTRODUODENOSCOPY (EGD);  Surgeon: Beryle Beams, MD;  Location: [facility];  Service: Endoscopy;  Laterality: Left;   EYE SURGERY     bilateral cataract extraction with IOL   TOTAL KNEE ARTHROPLASTY  12/13/2011   Procedure: TOTAL KNEE ARTHROPLASTY;  Surgeon: Johnn Hai, MD;  Location: [facility];  Service: Orthopedics;  Laterality: Left;    No Known Allergies  Allergies as of 04/11/2021   No Known Allergies      Medication List        Accurate as of April 11, 2021 10:37 AM. If you have any questions, ask your  nurse or doctor.          acetaminophen 500 MG tablet Commonly known as: TYLENOL Take 500 mg by mouth 2 (two) times daily.   donepezil 5 MG tablet Commonly known as: ARICEPT Take 5 mg by mouth at bedtime.   HYDROcodone-acetaminophen 5-325 MG tablet Commonly known as: NORCO/VICODIN Take 1 tablet by mouth 2 (two) times daily. For back pain   Lidocaine 4 % Ptch Apply 4 % topically daily. Remove Lidocaine Patch every evening from left knee. 12 hours on 12 hours off   memantine 10 MG tablet Commonly known as: NAMENDA Take 10 mg by mouth 2 (two) times daily.   Myrbetriq 50 MG Tb24 tablet Generic drug: mirabegron ER Take 50 mg by mouth daily.   pantoprazole 40 MG tablet Commonly known as: Protonix Take 1 tablet (40 mg total) by mouth 2 (two) times daily.        Review of Systems  Unable to perform ROS: Dementia   Immunization History  Administered Date(s) Administered   Influenza Whole 05/27/2012, 06/10/2013, 06/06/2018   Influenza, High Dose Seasonal PF 05/30/2019   Influenza-Unspecified 06/14/2014, 05/26/2015, 06/13/2016, 06/17/2017, 06/08/2020   Moderna Sars-Covid-2 Vaccination 08/29/2019, 09/26/2019, 07/05/2020, 01/24/2021   Pneumococcal Conjugate-13 06/24/2017   Pneumococcal Polysaccharide-23 10/03/2011   Td 10/03/2011   Zoster, Live 08/28/2007   Pertinent  Health Maintenance Due  Topic Date Due   INFLUENZA VACCINE  03/27/2021   DEXA SCAN  Completed   PNA vac Low Risk Adult  Completed   Fall Risk  03/27/2019 05/17/2017 09/15/2015 01/21/2014  Falls in the past year? (No Data) No No No  Comment Emmi Telephone Survey: data to providers prior to load - - -  Number falls in past yr: (No Data) - - -  Comment Emmi Telephone Survey Actual Response =  - - -   Functional Status Survey:    Vitals:   04/11/21 1026  BP: 131/70  Pulse: 80  Resp: 18  Temp: 97.8 F (36.6 C)  SpO2: 98%  Weight: 168 lb 3.2 oz (76.3 kg)  Height: '5\' 6"'$  (1.676 m)   Body mass index is  27.15 kg/m. Physical Exam Vitals reviewed.  Constitutional:      Appearance: Normal appearance.  HENT:     Head: Normocephalic.     Nose: Nose normal.     Mouth/Throat:     Mouth: Mucous membranes are moist.     Pharynx: Oropharynx is clear.  Eyes:     Pupils: Pupils are equal, round, and reactive to light.  Cardiovascular:     Rate and Rhythm: Normal rate and regular rhythm.  Pulmonary:     Effort: Pulmonary effort is normal.     Breath sounds: Normal breath sounds.  Abdominal:     General: Abdomen is flat. Bowel sounds are normal.  Musculoskeletal:        General:  No swelling.     Cervical back: Neck supple.  Skin:    General: Skin is warm.  Neurological:     General: No focal deficit present.     Mental Status: She is alert.     Comments: Wheelchair Dependent  Psychiatric:        Mood and Affect: Mood normal.        Thought Content: Thought content normal.    Labs reviewed: Recent Labs    09/09/20 0000 12/14/20 0108 02/10/21 0000  NA 142 143 137  K 4.2 4.3 4.0  CL 108 109* 106  CO2 25* 24* 22  BUN '18 18 16  '$ CREATININE 0.9 0.7 1.0  CALCIUM 9.2 9.3 9.3   Recent Labs    09/09/20 0000 12/14/20 0108 02/10/21 0000  AST '15 13 17  '$ ALT '16 14 16  '$ ALKPHOS 120 128* 142*  ALBUMIN 3.5 3.6 3.9   Recent Labs    09/09/20 0000 12/14/20 0108 02/10/21 0000  WBC 6.0 8.3 7.9  NEUTROABS  --  5,686.00 6,707.00  HGB 14.9 15.2 15.6  HCT 43 47* 47*  PLT 253 337 319   Lab Results  Component Value Date   TSH 1.35 09/09/2020   Lab Results  Component Value Date   HGBA1C 5.6 04/24/2016   Lab Results  Component Value Date   CHOL 189 09/06/2015   HDL 65 09/06/2015   LDLCALC 99 09/06/2015   TRIG 126 09/06/2015    Significant Diagnostic Results in last 30 days:  No results found.  Assessment/Plan Weight loss ? Due to Covid/ Dementia Will continue to work with Dietary  Post covid-19 condition, unspecified Doing well Some Fatigue noticed Vascular dementia  without behavioral disturbance ([place]) On Aricept and Namenda Behaviors are controlled History Duodenal ulcer without hemorrhage or perforation On Protonix Overactive bladder Continue Myrbetriq Primary osteoarthritis involving multiple joints On tylenol and Norco   Family/ staff Communication:   Labs/tests ordered:

## 2021-04-13 DIAGNOSIS — M6281 Muscle weakness (generalized): Secondary | ICD-10-CM | POA: Diagnosis not present

## 2021-04-13 DIAGNOSIS — M25661 Stiffness of right knee, not elsewhere classified: Secondary | ICD-10-CM | POA: Diagnosis not present

## 2021-04-13 DIAGNOSIS — Z8616 Personal history of COVID-19: Secondary | ICD-10-CM | POA: Diagnosis not present

## 2021-04-13 DIAGNOSIS — M25561 Pain in right knee: Secondary | ICD-10-CM | POA: Diagnosis not present

## 2021-04-13 DIAGNOSIS — R41841 Cognitive communication deficit: Secondary | ICD-10-CM | POA: Diagnosis not present

## 2021-04-13 DIAGNOSIS — R2681 Unsteadiness on feet: Secondary | ICD-10-CM | POA: Diagnosis not present

## 2021-04-14 DIAGNOSIS — M25561 Pain in right knee: Secondary | ICD-10-CM | POA: Diagnosis not present

## 2021-04-14 DIAGNOSIS — R41841 Cognitive communication deficit: Secondary | ICD-10-CM | POA: Diagnosis not present

## 2021-04-14 DIAGNOSIS — M6281 Muscle weakness (generalized): Secondary | ICD-10-CM | POA: Diagnosis not present

## 2021-04-14 DIAGNOSIS — M25661 Stiffness of right knee, not elsewhere classified: Secondary | ICD-10-CM | POA: Diagnosis not present

## 2021-04-14 DIAGNOSIS — Z8616 Personal history of COVID-19: Secondary | ICD-10-CM | POA: Diagnosis not present

## 2021-04-14 DIAGNOSIS — R2681 Unsteadiness on feet: Secondary | ICD-10-CM | POA: Diagnosis not present

## 2021-04-16 DIAGNOSIS — M6281 Muscle weakness (generalized): Secondary | ICD-10-CM | POA: Diagnosis not present

## 2021-04-16 DIAGNOSIS — R41841 Cognitive communication deficit: Secondary | ICD-10-CM | POA: Diagnosis not present

## 2021-04-16 DIAGNOSIS — Z8616 Personal history of COVID-19: Secondary | ICD-10-CM | POA: Diagnosis not present

## 2021-04-16 DIAGNOSIS — M25561 Pain in right knee: Secondary | ICD-10-CM | POA: Diagnosis not present

## 2021-04-16 DIAGNOSIS — R2681 Unsteadiness on feet: Secondary | ICD-10-CM | POA: Diagnosis not present

## 2021-04-16 DIAGNOSIS — M25661 Stiffness of right knee, not elsewhere classified: Secondary | ICD-10-CM | POA: Diagnosis not present

## 2021-04-17 DIAGNOSIS — R2681 Unsteadiness on feet: Secondary | ICD-10-CM | POA: Diagnosis not present

## 2021-04-17 DIAGNOSIS — R41841 Cognitive communication deficit: Secondary | ICD-10-CM | POA: Diagnosis not present

## 2021-04-17 DIAGNOSIS — M6281 Muscle weakness (generalized): Secondary | ICD-10-CM | POA: Diagnosis not present

## 2021-04-17 DIAGNOSIS — Z8616 Personal history of COVID-19: Secondary | ICD-10-CM | POA: Diagnosis not present

## 2021-04-17 DIAGNOSIS — M25661 Stiffness of right knee, not elsewhere classified: Secondary | ICD-10-CM | POA: Diagnosis not present

## 2021-04-17 DIAGNOSIS — M25561 Pain in right knee: Secondary | ICD-10-CM | POA: Diagnosis not present

## 2021-04-18 DIAGNOSIS — R2681 Unsteadiness on feet: Secondary | ICD-10-CM | POA: Diagnosis not present

## 2021-04-18 DIAGNOSIS — R41841 Cognitive communication deficit: Secondary | ICD-10-CM | POA: Diagnosis not present

## 2021-04-18 DIAGNOSIS — M25661 Stiffness of right knee, not elsewhere classified: Secondary | ICD-10-CM | POA: Diagnosis not present

## 2021-04-18 DIAGNOSIS — M6281 Muscle weakness (generalized): Secondary | ICD-10-CM | POA: Diagnosis not present

## 2021-04-18 DIAGNOSIS — M25561 Pain in right knee: Secondary | ICD-10-CM | POA: Diagnosis not present

## 2021-04-18 DIAGNOSIS — Z8616 Personal history of COVID-19: Secondary | ICD-10-CM | POA: Diagnosis not present

## 2021-04-20 DIAGNOSIS — M6281 Muscle weakness (generalized): Secondary | ICD-10-CM | POA: Diagnosis not present

## 2021-04-20 DIAGNOSIS — Z8616 Personal history of COVID-19: Secondary | ICD-10-CM | POA: Diagnosis not present

## 2021-04-20 DIAGNOSIS — M25561 Pain in right knee: Secondary | ICD-10-CM | POA: Diagnosis not present

## 2021-04-20 DIAGNOSIS — R41841 Cognitive communication deficit: Secondary | ICD-10-CM | POA: Diagnosis not present

## 2021-04-20 DIAGNOSIS — M25661 Stiffness of right knee, not elsewhere classified: Secondary | ICD-10-CM | POA: Diagnosis not present

## 2021-04-20 DIAGNOSIS — R2681 Unsteadiness on feet: Secondary | ICD-10-CM | POA: Diagnosis not present

## 2021-04-24 DIAGNOSIS — R41841 Cognitive communication deficit: Secondary | ICD-10-CM | POA: Diagnosis not present

## 2021-04-24 DIAGNOSIS — Z8616 Personal history of COVID-19: Secondary | ICD-10-CM | POA: Diagnosis not present

## 2021-04-24 DIAGNOSIS — M25561 Pain in right knee: Secondary | ICD-10-CM | POA: Diagnosis not present

## 2021-04-24 DIAGNOSIS — M25661 Stiffness of right knee, not elsewhere classified: Secondary | ICD-10-CM | POA: Diagnosis not present

## 2021-04-24 DIAGNOSIS — M6281 Muscle weakness (generalized): Secondary | ICD-10-CM | POA: Diagnosis not present

## 2021-04-24 DIAGNOSIS — R2681 Unsteadiness on feet: Secondary | ICD-10-CM | POA: Diagnosis not present

## 2021-05-05 ENCOUNTER — Non-Acute Institutional Stay (SKILLED_NURSING_FACILITY): Payer: Medicare Other | Admitting: Nurse Practitioner

## 2021-05-05 DIAGNOSIS — F0150 Vascular dementia without behavioral disturbance: Secondary | ICD-10-CM | POA: Diagnosis not present

## 2021-05-05 DIAGNOSIS — N3281 Overactive bladder: Secondary | ICD-10-CM

## 2021-05-05 DIAGNOSIS — U071 COVID-19: Secondary | ICD-10-CM

## 2021-05-05 DIAGNOSIS — M8949 Other hypertrophic osteoarthropathy, multiple sites: Secondary | ICD-10-CM | POA: Diagnosis not present

## 2021-05-05 DIAGNOSIS — M159 Polyosteoarthritis, unspecified: Secondary | ICD-10-CM

## 2021-05-05 DIAGNOSIS — K269 Duodenal ulcer, unspecified as acute or chronic, without hemorrhage or perforation: Secondary | ICD-10-CM | POA: Diagnosis not present

## 2021-05-05 DIAGNOSIS — R269 Unspecified abnormalities of gait and mobility: Secondary | ICD-10-CM

## 2021-05-05 DIAGNOSIS — K5901 Slow transit constipation: Secondary | ICD-10-CM

## 2021-05-05 NOTE — Assessment & Plan Note (Signed)
uses walker for ambulation.   

## 2021-05-05 NOTE — Assessment & Plan Note (Signed)
stable °

## 2021-05-05 NOTE — Assessment & Plan Note (Signed)
COVID 6/22, treated with Paxlovid

## 2021-05-05 NOTE — Assessment & Plan Note (Signed)
GERD/PUD/gastric erosions, stable, on Pantoprazole '40mg'$  bid. Hgb 15.6 02/10/21

## 2021-05-05 NOTE — Assessment & Plan Note (Signed)
managed on Myrbetriq 50mg  qd.

## 2021-05-05 NOTE — Assessment & Plan Note (Signed)
s/p L TKR 2013,  on Norco 5/'325mg'$  bid, Tylenol '500mg'$  bid.

## 2021-05-05 NOTE — Assessment & Plan Note (Signed)
on Memantine '10mg'$  bid, Donepezil '5mg'$  qd for memory. MMSE 20/30 09/10/19. TSH 1.35 09/09/20

## 2021-05-05 NOTE — Progress Notes (Signed)
Location:   [facility] Room Number: 5 Place of Service:  SNF (31) Provider: Lennie Odor Keniah Klemmer NP  Jhon Mallozzi X, NP  Patient Care Team: Adiah Guereca X, NP as PCP - General (Internal Medicine) [name] Vuong Musa X, NP as Nurse Practitioner (Nurse Practitioner) [name], MD as Attending Physician (Urology) [name], MD as Consulting Physician (Gastroenterology)  Extended Emergency Contact Information Primary Emergency Contact: Cashaw,[name] Address: [place] [address] [name] of [place] Phone: [phone] Relation: Son  Code Status:  DNR Goals of care: Advanced Directive information Advanced Directives 04/11/2021  Does Patient Have a Medical Advance Directive? Yes  Type of Advance Directive Living will;Out of facility DNR (pink MOST or yellow form);Healthcare Power of Attorney  Does patient want to make changes to medical advance directive? No - Patient declined  Copy of [place] in Chart? Yes - validated most recent copy scanned in chart (See row information)  Pre-existing out of facility DNR order (yellow form or pink MOST form) Yellow form placed in chart (order not valid for inpatient use)     Chief Complaint  Patient presents with   Medical Management of Chronic Issues    HPI:  Pt is a 87 y.o. female seen today for medical management of chronic diseases.      COVID 6/22, treated with Paxlovid             Dementia, on Memantine '10mg'$  bid, Donepezil '5mg'$  qd for memory. MMSE 20/30 09/10/19. TSH 1.35 09/09/20             Hx of OA, s/p L TKR 2013,  on Norco 5/'325mg'$  bid, Tylenol '500mg'$  bid.                 Gait abnormalit, uses walker for ambulation.                    GERD/PUD/gastric erosions, stable, on Pantoprazole '40mg'$  bid. Hgb 15.6 02/10/21             Urinary frequency, managed on Myrbetriq '50mg'$  qd.              Constipation, stable      Past Medical History:  Diagnosis Date    Allergic rhinitis due to pollen 07/01/2009   Anemia, unspecified 09/07/2010   Arthritis    Benign paroxysmal positional vertigo    Candidiasis 01/01/2012   Cramp of limb 01/11/2011   Disorder of bone and cartilage, unspecified 11/10/2010   Dizziness and giddiness 11/10/2010   Insomnia, unspecified 09/07/2010   Knee pain, left anterior    Lumbago 07/01/2009   Lump or mass in breast 08/01/2012   Memory loss 01/22/2012   Myalgia and myositis, unspecified 05/17/2011   Myopathy, unspecified    Osteoarthrosis, unspecified whether generalized or localized, lower leg    Other abnormal glucose 03/23/2010   Other chest pain 08/28/2012   Other disorders of bone and cartilage(733.99) 09/19/2012   Overactive bladder    Rash    inner legs- ? med allergy- to see PCP if doesnt improve by end of week   Rash and nonspecific skin eruption 10/28/2014   Right bundle branch block    Seborrheic keratoses 05/17/2011   Spasm of muscle 01/11/2011   Spinal stenosis, lumbar region, without neurogenic claudication    Stress incontinence 07/08/2014   Unspecified constipation 12/18/2011   Unspecified tinnitus    Unspecified  urinary incontinence    Urgency of urination    Urinary frequency 09/18/2012   Past Surgical History:  Procedure Laterality Date   ABDOMINAL HYSTERECTOMY     APPENDECTOMY  1956   BACK SURGERY     lumbar-- ? type  Dr Tonita Cong   BREAST LUMPECTOMY  1993   breast cyst   BREAST SURGERY     right lumpectomy   CLEFT LIP REPAIR     with repair palate   ESOPHAGOGASTRODUODENOSCOPY Left 03/10/2013   Procedure: ESOPHAGOGASTRODUODENOSCOPY (EGD);  Surgeon: Beryle Beams, MD;  Location: [facility];  Service: Endoscopy;  Laterality: Left;   EYE SURGERY     bilateral cataract extraction with IOL   TOTAL KNEE ARTHROPLASTY  12/13/2011   Procedure: TOTAL KNEE ARTHROPLASTY;  Surgeon: Johnn Hai, MD;  Location: [facility];  Service: Orthopedics;  Laterality: Left;    No Known Allergies  Allergies as of 05/05/2021    No Known Allergies      Medication List        Accurate as of May 05, 2021 11:59 PM. If you have any questions, ask your nurse or doctor.          acetaminophen 500 MG tablet Commonly known as: TYLENOL Take 500 mg by mouth 2 (two) times daily.   donepezil 5 MG tablet Commonly known as: ARICEPT Take 5 mg by mouth at bedtime.   HYDROcodone-acetaminophen 5-325 MG tablet Commonly known as: NORCO/VICODIN Take 1 tablet by mouth 2 (two) times daily. For back pain   Lidocaine 4 % Ptch Apply 4 % topically daily. Remove Lidocaine Patch every evening from left knee. 12 hours on 12 hours off   memantine 10 MG tablet Commonly known as: NAMENDA Take 10 mg by mouth 2 (two) times daily.   Myrbetriq 50 MG Tb24 tablet Generic drug: mirabegron ER Take 50 mg by mouth daily.   pantoprazole 40 MG tablet Commonly known as: Protonix Take 1 tablet (40 mg total) by mouth 2 (two) times daily.        Review of Systems  Constitutional:  Negative for activity change, appetite change and fever.  HENT:  Positive for hearing loss. Negative for congestion, sinus pressure and trouble swallowing.   Eyes:  Negative for visual disturbance.  Respiratory:  Negative for cough and shortness of breath.   Cardiovascular:  Negative for leg swelling.  Gastrointestinal:  Negative for abdominal pain and constipation.  Genitourinary:  Positive for frequency. Negative for dysuria and urgency.  Musculoskeletal:  Positive for arthralgias, back pain and gait problem.       Left knee pain  Skin:  Negative for color change.  Neurological:  Negative for speech difficulty, weakness and light-headedness.       Dementia  Psychiatric/Behavioral:  Positive for confusion. Negative for behavioral problems and sleep disturbance.    Immunization History  Administered Date(s) Administered   Influenza Whole 05/27/2012, 06/10/2013, 06/06/2018   Influenza, High Dose Seasonal PF 05/30/2019   Influenza-Unspecified  06/14/2014, 05/26/2015, 06/13/2016, 06/17/2017, 06/08/2020   Moderna Sars-Covid-2 Vaccination 08/29/2019, 09/26/2019, 07/05/2020, 01/24/2021   Pneumococcal Conjugate-13 06/24/2017   Pneumococcal Polysaccharide-23 10/03/2011   Td 10/03/2011   Zoster, Live 08/28/2007   Pertinent  Health Maintenance Due  Topic Date Due   INFLUENZA VACCINE  03/27/2021   DEXA SCAN  Completed   PNA vac Low Risk Adult  Completed   Fall Risk  03/27/2019 05/17/2017 09/15/2015 01/21/2014  Falls in the past year? (No Data) No No No  Comment Emmi  Telephone Survey: data to providers prior to load - - -  Number falls in past yr: (No Data) - - -  Comment Emmi Telephone Survey Actual Response =  - - -   Functional Status Survey:    Vitals:   05/05/21 1016  BP: 111/66  Pulse: 99  Resp: 18  Temp: 98.1 F (36.7 C)  SpO2: 96%   There is no height or weight on file to calculate BMI. Physical Exam Vitals and nursing note reviewed.  Constitutional:      Appearance: Normal appearance.  HENT:     Head: Normocephalic and atraumatic.     Nose: Rhinorrhea present. No congestion.     Mouth/Throat:     Mouth: Mucous membranes are moist.  Eyes:     Extraocular Movements: Extraocular movements intact.     Conjunctiva/sclera:     Right eye: Right conjunctiva is not injected.     Left eye: Left conjunctiva is not injected.     Pupils: Pupils are equal, round, and reactive to light.  Cardiovascular:     Rate and Rhythm: Normal rate and regular rhythm.     Heart sounds: No murmur heard. Pulmonary:     Effort: Pulmonary effort is normal.     Breath sounds: No rales.  Abdominal:     General: Bowel sounds are normal.     Palpations: Abdomen is soft.     Tenderness: There is no abdominal tenderness.  Musculoskeletal:     Cervical back: Normal range of motion and neck supple.     Right lower leg: No edema.     Left lower leg: No edema.     Comments: Ambulates with walker. Left knee pain when walking.   Skin:     General: Skin is warm and dry.  Neurological:     General: No focal deficit present.     Mental Status: She is alert. Mental status is at baseline.     Gait: Gait abnormal.     Comments: Oriented to self, her room. Ambulates with walker with SBA  Psychiatric:        Mood and Affect: Mood normal.        Behavior: Behavior normal.    Labs reviewed: Recent Labs    09/09/20 0000 12/14/20 0108 02/10/21 0000  NA 142 143 137  K 4.2 4.3 4.0  CL 108 109* 106  CO2 25* 24* 22  BUN '18 18 16  '$ CREATININE 0.9 0.7 1.0  CALCIUM 9.2 9.3 9.3   Recent Labs    09/09/20 0000 12/14/20 0108 02/10/21 0000  AST '15 13 17  '$ ALT '16 14 16  '$ ALKPHOS 120 128* 142*  ALBUMIN 3.5 3.6 3.9   Recent Labs    09/09/20 0000 12/14/20 0108 02/10/21 0000  WBC 6.0 8.3 7.9  NEUTROABS  --  5,686.00 6,707.00  HGB 14.9 15.2 15.6  HCT 43 47* 47*  PLT 253 337 319   Lab Results  Component Value Date   TSH 1.35 09/09/2020   Lab Results  Component Value Date   HGBA1C 5.6 04/24/2016   Lab Results  Component Value Date   CHOL 189 09/06/2015   HDL 65 09/06/2015   LDLCALC 99 09/06/2015   TRIG 126 09/06/2015    Significant Diagnostic Results in last 30 days:  No results found.  Assessment/Plan  Duodenal ulcer without hemorrhage or perforation GERD/PUD/gastric erosions, stable, on Pantoprazole '40mg'$  bid. Hgb 15.6 02/10/21  Overactive bladder managed on Myrbetriq '50mg'$  qd.  Slow transit constipation stable  Gait abnormality uses walker for ambulation.         Osteoarthritis, multiple sites s/p L TKR 2013,  on Norco 5/'325mg'$  bid, Tylenol '500mg'$  bid.      Dementia without behavioral disturbance ([facility])  on Memantine '10mg'$  bid, Donepezil '5mg'$  qd for memory. MMSE 20/30 09/10/19. TSH 1.35 09/09/20  COVID-19 virus infection COVID 6/22, treated with Paxlovid   Family/ staff Communication: plan of care reviewed with the patient and charge nurse.   Labs/tests ordered: none  Time spend 35 minutes.

## 2021-05-08 ENCOUNTER — Encounter: Payer: Self-pay | Admitting: Nurse Practitioner

## 2021-05-16 DIAGNOSIS — Z23 Encounter for immunization: Secondary | ICD-10-CM | POA: Diagnosis not present

## 2021-05-31 ENCOUNTER — Encounter: Payer: Self-pay | Admitting: Nurse Practitioner

## 2021-05-31 ENCOUNTER — Non-Acute Institutional Stay (SKILLED_NURSING_FACILITY): Payer: Medicare Other | Admitting: Nurse Practitioner

## 2021-05-31 DIAGNOSIS — R269 Unspecified abnormalities of gait and mobility: Secondary | ICD-10-CM | POA: Diagnosis not present

## 2021-05-31 DIAGNOSIS — F0390 Unspecified dementia without behavioral disturbance: Secondary | ICD-10-CM

## 2021-05-31 DIAGNOSIS — M159 Polyosteoarthritis, unspecified: Secondary | ICD-10-CM | POA: Diagnosis not present

## 2021-05-31 DIAGNOSIS — U071 COVID-19: Secondary | ICD-10-CM

## 2021-05-31 DIAGNOSIS — K257 Chronic gastric ulcer without hemorrhage or perforation: Secondary | ICD-10-CM | POA: Diagnosis not present

## 2021-05-31 DIAGNOSIS — N3281 Overactive bladder: Secondary | ICD-10-CM

## 2021-05-31 DIAGNOSIS — M150 Primary generalized (osteo)arthritis: Secondary | ICD-10-CM

## 2021-05-31 NOTE — Assessment & Plan Note (Signed)
on Memantine, Donepezil for memory. MMSE 20/30 09/10/19. TSH 1.35 09/09/20

## 2021-05-31 NOTE — Progress Notes (Signed)
Location:   [facility] Room Number: 98 Place of Service:  SNF (31) Provider: Lennie Odor Ismael Karge NP  Avyonna Wagoner X, NP  Patient Care Team: Raphael Fitzpatrick X, NP as PCP - General (Internal Medicine) [name] Roseann Kees X, NP as Nurse Practitioner (Nurse Practitioner) [name], MD as Attending Physician (Urology) [name], MD as Consulting Physician (Gastroenterology)  Extended Emergency Contact Information Primary Emergency Contact: Joshua,[name] Address: [place] [address] [name] of [place] Phone: [phone] Relation: Son  Code Status:  DNR Goals of care: Advanced Directive information Advanced Directives 05/31/2021  Does Patient Have a Medical Advance Directive? Yes  Type of Paramedic of [place];Living will;Out of facility DNR (pink MOST or yellow form)  Does patient want to make changes to medical advance directive? No - Patient declined  Copy of [place] in Chart? Yes - validated most recent copy scanned in chart (See row information)  Pre-existing out of facility DNR order (yellow form or pink MOST form) Yellow form placed in chart (order not valid for inpatient use)     Chief Complaint  Patient presents with   Medical Management of Chronic Issues    Routine follow up visit   Health Maintenance    Zoster vaccine,flu vaccine, 3rd COVID booster     HPI:  Pt is a 87 y.o. female seen today for medical management of chronic diseases.      COVID 6/22, treated with Paxlovid, clinically recovered.              Dementia, on Memantine, Donepezil for memory. MMSE 20/30 09/10/19. TSH 1.35 09/09/20             Hx of OA, s/p L TKR 2013,  on Norco 5/325mg  bid, Tylenol 500mg  bid.                 Gait abnormalit, uses walker for ambulation.                    GERD/PUD/gastric erosions, stable, on Pantoprazole 40mg  bid. Hgb 15.6 02/10/21             Urinary frequency, managed on  Myrbetriq 50mg  qd.              Constipation, stable    Past Medical History:  Diagnosis Date   Allergic rhinitis due to pollen 07/01/2009   Anemia, unspecified 09/07/2010   Arthritis    Benign paroxysmal positional vertigo    Candidiasis 01/01/2012   Cramp of limb 01/11/2011   Disorder of bone and cartilage, unspecified 11/10/2010   Dizziness and giddiness 11/10/2010   Insomnia, unspecified 09/07/2010   Knee pain, left anterior    Lumbago 07/01/2009   Lump or mass in breast 08/01/2012   Memory loss 01/22/2012   Myalgia and myositis, unspecified 05/17/2011   Myopathy, unspecified    Osteoarthrosis, unspecified whether generalized or localized, lower leg    Other abnormal glucose 03/23/2010   Other chest pain 08/28/2012   Other disorders of bone and cartilage(733.99) 09/19/2012   Overactive bladder    Rash    inner legs- ? med allergy- to see PCP if doesnt improve by end of week   Rash and nonspecific skin eruption 10/28/2014   Right bundle branch block    Seborrheic keratoses 05/17/2011   Spasm of muscle 01/11/2011   Spinal stenosis, lumbar region, without neurogenic claudication  Stress incontinence 07/08/2014   Unspecified constipation 12/18/2011   Unspecified tinnitus    Unspecified urinary incontinence    Urgency of urination    Urinary frequency 09/18/2012   Past Surgical History:  Procedure Laterality Date   ABDOMINAL HYSTERECTOMY     APPENDECTOMY  1956   BACK SURGERY     lumbar-- ? type  Dr Tonita Cong   BREAST LUMPECTOMY  1993   breast cyst   BREAST SURGERY     right lumpectomy   CLEFT LIP REPAIR     with repair palate   ESOPHAGOGASTRODUODENOSCOPY Left 03/10/2013   Procedure: ESOPHAGOGASTRODUODENOSCOPY (EGD);  Surgeon: Beryle Beams, MD;  Location: [facility];  Service: Endoscopy;  Laterality: Left;   EYE SURGERY     bilateral cataract extraction with IOL   TOTAL KNEE ARTHROPLASTY  12/13/2011   Procedure: TOTAL KNEE ARTHROPLASTY;  Surgeon: Johnn Hai, MD;  Location: [facility];  Service: Orthopedics;  Laterality: Left;    No Known Allergies  Allergies as of 05/31/2021   No Known Allergies      Medication List        Accurate as of May 31, 2021 11:59 PM. If you have any questions, ask your nurse or doctor.          acetaminophen 500 MG tablet Commonly known as: TYLENOL Take 500 mg by mouth 2 (two) times daily.   donepezil 5 MG tablet Commonly known as: ARICEPT Take 5 mg by mouth at bedtime.   HYDROcodone-acetaminophen 5-325 MG tablet Commonly known as: NORCO/VICODIN Take 1 tablet by mouth 2 (two) times daily. For back pain   Lidocaine 4 % Ptch Apply 4 % topically daily. Remove Lidocaine Patch every evening from left knee. 12 hours on 12 hours off   memantine 10 MG tablet Commonly known as: NAMENDA Take 10 mg by mouth 2 (two) times daily.   Myrbetriq 50 MG Tb24 tablet Generic drug: mirabegron ER Take 50 mg by mouth daily.   pantoprazole 40 MG tablet Commonly known as: Protonix Take 1 tablet (40 mg total) by mouth 2 (two) times daily.   polyethylene glycol 17 g packet Commonly known as: MIRALAX / GLYCOLAX Take 17 g by mouth daily.       ROS was provided with assistance of staff Review of Systems  Constitutional:  Negative for fatigue, fever and unexpected weight change.  HENT:  Positive for hearing loss. Negative for congestion, sinus pressure and trouble swallowing.   Eyes:  Negative for visual disturbance.  Respiratory:  Negative for cough and wheezing.   Cardiovascular:  Negative for leg swelling.  Gastrointestinal:  Negative for abdominal pain.  Genitourinary:  Positive for frequency. Negative for dysuria and urgency.  Musculoskeletal:  Positive for arthralgias, back pain and gait problem.       Left knee pain  Skin:  Negative for color change.  Neurological:  Negative for speech difficulty, weakness and headaches.       Dementia  Psychiatric/Behavioral:  Positive for confusion. Negative for behavioral problems and  sleep disturbance.    Immunization History  Administered Date(s) Administered   Influenza Whole 05/27/2012, 06/10/2013, 06/06/2018   Influenza, High Dose Seasonal PF 05/30/2019   Influenza-Unspecified 06/14/2014, 05/26/2015, 06/13/2016, 06/17/2017, 06/08/2020   Moderna Sars-Covid-2 Vaccination 08/29/2019, 09/26/2019, 07/05/2020, 01/24/2021   Pneumococcal Conjugate-13 06/24/2017   Pneumococcal Polysaccharide-23 10/03/2011   Td 10/03/2011   Zoster, Live 08/28/2007   Pertinent  Health Maintenance Due  Topic Date Due   INFLUENZA VACCINE  03/27/2021  DEXA SCAN  Completed   Fall Risk  03/27/2019 05/17/2017 09/15/2015 01/21/2014  Falls in the past year? (No Data) No No No  Comment Emmi Telephone Survey: data to providers prior to load - - -  Number falls in past yr: (No Data) - - -  Comment Emmi Telephone Survey Actual Response =  - - -   Functional Status Survey:    Vitals:   05/31/21 1536  BP: 132/76  Pulse: 76  Resp: 17  Temp: 98.4 F (36.9 C)  SpO2: 93%  Weight: 165 lb 3.2 oz (74.9 kg)  Height: 5\' 6"  (1.676 m)   Body mass index is 26.66 kg/m. Physical Exam Vitals and nursing note reviewed.  Constitutional:      Appearance: Normal appearance.  HENT:     Head: Normocephalic and atraumatic.     Nose: Nose normal. No congestion.     Mouth/Throat:     Mouth: Mucous membranes are moist.  Eyes:     Extraocular Movements: Extraocular movements intact.     Conjunctiva/sclera:     Right eye: Right conjunctiva is not injected.     Left eye: Left conjunctiva is not injected.     Pupils: Pupils are equal, round, and reactive to light.  Cardiovascular:     Rate and Rhythm: Normal rate and regular rhythm.     Heart sounds: No murmur heard. Pulmonary:     Effort: Pulmonary effort is normal.     Breath sounds: No rales.  Abdominal:     General: Bowel sounds are normal.     Palpations: Abdomen is soft.     Tenderness: There is no abdominal tenderness.  Musculoskeletal:      Cervical back: Normal range of motion and neck supple.     Right lower leg: No edema.     Left lower leg: No edema.     Comments: Ambulates with walker. Left knee pain when walking.   Skin:    General: Skin is warm and dry.  Neurological:     General: No focal deficit present.     Mental Status: She is alert. Mental status is at baseline.     Gait: Gait abnormal.     Comments: Oriented to self, her room. Ambulates with walker with SBA  Psychiatric:        Mood and Affect: Mood normal.        Behavior: Behavior normal.    Labs reviewed: Recent Labs    09/09/20 0000 12/14/20 0108 02/10/21 0000  NA 142 143 137  K 4.2 4.3 4.0  CL 108 109* 106  CO2 25* 24* 22  BUN 18 18 16   CREATININE 0.9 0.7 1.0  CALCIUM 9.2 9.3 9.3   Recent Labs    09/09/20 0000 12/14/20 0108 02/10/21 0000  AST 15 13 17   ALT 16 14 16   ALKPHOS 120 128* 142*  ALBUMIN 3.5 3.6 3.9   Recent Labs    09/09/20 0000 12/14/20 0108 02/10/21 0000  WBC 6.0 8.3 7.9  NEUTROABS  --  5,686.00 6,707.00  HGB 14.9 15.2 15.6  HCT 43 47* 47*  PLT 253 337 319   Lab Results  Component Value Date   TSH 1.35 09/09/2020   Lab Results  Component Value Date   HGBA1C 5.6 04/24/2016   Lab Results  Component Value Date   CHOL 189 09/06/2015   HDL 65 09/06/2015   LDLCALC 99 09/06/2015   TRIG 126 09/06/2015    Significant Diagnostic Results in  last 30 days:  No results found.  Assessment/Plan  Dementia without behavioral disturbance (HCC) on Memantine, Donepezil for memory. MMSE 20/30 09/10/19. TSH 1.35 09/09/20  Osteoarthritis, multiple sites Hx of OA, s/p L TKR 2013,  on Norco 5/325mg  bid, Tylenol 500mg  bid.      Gait abnormality uses walker for ambulation.         Gastric erosions stable, on Pantoprazole 40mg  bid. Hgb 15.6 02/10/21  Overactive bladder managed on Myrbetriq 50mg  qd.   COVID-19 virus infection 6/22, treated with Paxlovid, clinically recovered. Weight down  #180 01/2021>>168 03/2021>>165  05/2021. Continue to observe.    Family/ staff Communication: plan of care reviewed with the patient and charge nurse.   Labs/tests ordered:  none  Time spend 35 minutes.

## 2021-05-31 NOTE — Assessment & Plan Note (Signed)
managed on Myrbetriq 50mg  qd.

## 2021-05-31 NOTE — Assessment & Plan Note (Signed)
uses walker for ambulation.   

## 2021-05-31 NOTE — Assessment & Plan Note (Signed)
Hx of OA, s/p L TKR 2013,  on Norco 5/325mg  bid, Tylenol 500mg  bid.

## 2021-05-31 NOTE — Assessment & Plan Note (Signed)
stable, on Pantoprazole 40mg  bid. Hgb 15.6 02/10/21

## 2021-06-02 ENCOUNTER — Encounter: Payer: Self-pay | Admitting: Nurse Practitioner

## 2021-06-02 NOTE — Assessment & Plan Note (Signed)
6/22, treated with Paxlovid, clinically recovered. Weight down  #180 01/2021>>168 03/2021>>165 05/2021. Continue to observe.

## 2021-06-07 ENCOUNTER — Other Ambulatory Visit: Payer: Self-pay | Admitting: Orthopedic Surgery

## 2021-06-07 DIAGNOSIS — M159 Polyosteoarthritis, unspecified: Secondary | ICD-10-CM

## 2021-06-07 MED ORDER — HYDROCODONE-ACETAMINOPHEN 5-325 MG PO TABS: 1.0000 | ORAL_TABLET | Freq: Two times a day (BID) | ORAL | 0 refills | Status: DC

## 2021-06-27 ENCOUNTER — Encounter: Payer: Self-pay | Admitting: Internal Medicine

## 2021-06-27 ENCOUNTER — Non-Acute Institutional Stay (SKILLED_NURSING_FACILITY): Payer: Medicare Other | Admitting: Internal Medicine

## 2021-06-27 DIAGNOSIS — K269 Duodenal ulcer, unspecified as acute or chronic, without hemorrhage or perforation: Secondary | ICD-10-CM | POA: Diagnosis not present

## 2021-06-27 DIAGNOSIS — N3281 Overactive bladder: Secondary | ICD-10-CM | POA: Diagnosis not present

## 2021-06-27 DIAGNOSIS — F0390 Unspecified dementia without behavioral disturbance: Secondary | ICD-10-CM | POA: Diagnosis not present

## 2021-06-27 DIAGNOSIS — M159 Polyosteoarthritis, unspecified: Secondary | ICD-10-CM | POA: Diagnosis not present

## 2021-06-27 NOTE — Progress Notes (Signed)
Location:   [facility] Room Number: 51 Place of Service:  SNF (31) Provider:  Veleta Miners MD  Mast, Man X, NP  Patient Care Team: Mast, Man X, NP as PCP - General (Internal Medicine) [name] Mast, Man X, NP as Nurse Practitioner (Nurse Practitioner) [name], MD as Attending Physician (Urology) [name], MD as Consulting Physician (Gastroenterology)  Extended Emergency Contact Information Primary Emergency Contact: Monarrez,[name] Address: [place] [address] [name] [place] Phone: [phone] Relation: Son  Code Status:  DNR Goals of care: Advanced Directive information Advanced Directives 06/27/2021  Does Patient Have a Medical Advance Directive? Yes  Type of Paramedic of [place];Living will;Out of facility DNR (pink MOST or yellow form)  Does patient want to make changes to medical advance directive? No - Patient declined  Copy of [place] in Chart? Yes - validated most recent copy scanned in chart (See row information)  Pre-existing out of facility DNR order (yellow form or pink MOST form) Yellow form placed in chart (order not valid for inpatient use)     Chief Complaint  Patient presents with   Medical Management of Chronic Issues   Quality Metric Gaps    Shingrix    HPI:  Pt is a 87 y.o. female seen today for medical management of chronic diseases.    She has h/o Lumbago, Stress incontinence, Vertigo, h/o Duodenal Ulcer and dementia and Arthritis   Continues to stay stable Had lost weight but now her weight is stable Mostly now stays in Wheelchair No Falls No Behavior issues  Past Medical History:  Diagnosis Date   Allergic rhinitis due to pollen 07/01/2009   Anemia, unspecified 09/07/2010   Arthritis    Benign paroxysmal positional vertigo    Candidiasis 01/01/2012   Cramp of limb 01/11/2011   Disorder of bone and  cartilage, unspecified 11/10/2010   Dizziness and giddiness 11/10/2010   Insomnia, unspecified 09/07/2010   Knee pain, left anterior    Lumbago 07/01/2009   Lump or mass in breast 08/01/2012   Memory loss 01/22/2012   Myalgia and myositis, unspecified 05/17/2011   Myopathy, unspecified    Osteoarthrosis, unspecified whether generalized or localized, lower leg    Other abnormal glucose 03/23/2010   Other chest pain 08/28/2012   Other disorders of bone and cartilage(733.99) 09/19/2012   Overactive bladder    Rash    inner legs- ? med allergy- to see PCP if doesnt improve by end of week   Rash and nonspecific skin eruption 10/28/2014   Right bundle branch block    Seborrheic keratoses 05/17/2011   Spasm of muscle 01/11/2011   Spinal stenosis, lumbar region, without neurogenic claudication    Stress incontinence 07/08/2014   Unspecified constipation 12/18/2011   Unspecified tinnitus    Unspecified urinary incontinence    Urgency of urination    Urinary frequency 09/18/2012   Past Surgical History:  Procedure Laterality Date   ABDOMINAL HYSTERECTOMY     APPENDECTOMY  1956   BACK SURGERY     lumbar-- ? type  Dr Tonita Cong   BREAST LUMPECTOMY  1993   breast cyst   BREAST SURGERY     right lumpectomy   CLEFT LIP REPAIR     with repair palate   ESOPHAGOGASTRODUODENOSCOPY Left 03/10/2013   Procedure: ESOPHAGOGASTRODUODENOSCOPY (EGD);  Surgeon: Beryle Beams, MD;  Location: [facility];  Service: Endoscopy;  Laterality: Left;   EYE SURGERY     bilateral cataract extraction with IOL   TOTAL KNEE ARTHROPLASTY  12/13/2011   Procedure: TOTAL KNEE ARTHROPLASTY;  Surgeon: Johnn Hai, MD;  Location: [facility];  Service: Orthopedics;  Laterality: Left;    No Known Allergies  Allergies as of 06/27/2021   No Known Allergies      Medication List        Accurate as of June 27, 2021 12:00 PM. If you have any questions, ask your nurse or doctor.          STOP taking these medications     Lidocaine 4 % Ptch Stopped by: Virgie Dad, MD       TAKE these medications    acetaminophen 500 MG tablet Commonly known as: TYLENOL Take 500 mg by mouth 2 (two) times daily.   donepezil 5 MG tablet Commonly known as: ARICEPT Take 5 mg by mouth at bedtime.   HYDROcodone-acetaminophen 5-325 MG tablet Commonly known as: NORCO/VICODIN Take 1 tablet by mouth 2 (two) times daily. For back pain   lactose free nutrition Liqd Take 237 mLs by mouth daily.   memantine 10 MG tablet Commonly known as: NAMENDA Take 10 mg by mouth 2 (two) times daily.   Myrbetriq 50 MG Tb24 tablet Generic drug: mirabegron ER Take 50 mg by mouth daily.   pantoprazole 40 MG tablet Commonly known as: Protonix Take 1 tablet (40 mg total) by mouth 2 (two) times daily.   polyethylene glycol 17 g packet Commonly known as: MIRALAX / GLYCOLAX Take 17 g by mouth daily.        Review of Systems  Unable to perform ROS: Dementia   Immunization History  Administered Date(s) Administered   Influenza Whole 05/27/2012, 06/10/2013, 06/06/2018   Influenza, High Dose Seasonal PF 05/30/2019   Influenza-Unspecified 06/14/2014, 05/26/2015, 06/13/2016, 06/17/2017, 06/08/2020, 06/14/2021   Moderna Sars-Covid-2 Vaccination 08/29/2019, 09/26/2019, 07/05/2020, 01/24/2021   Pfizer Covid-19 Vaccine Bivalent Booster 34yrs & up 05/16/2021   Pneumococcal Conjugate-13 06/24/2017   Pneumococcal Polysaccharide-23 10/03/2011   Td 10/03/2011   Zoster, Live 08/28/2007   Pertinent  Health Maintenance Due  Topic Date Due   INFLUENZA VACCINE  Completed   DEXA SCAN  Completed   Fall Risk 01/21/2014 09/15/2015 05/17/2017 03/27/2019  Falls in the past year? No No No (No Data)  Number of falls in past year - - - Emmi Telephone Survey Actual Response =    Functional Status Survey:    Vitals:   06/27/21 1156  BP: 121/78  Pulse: 80  Resp: 17  Temp: 98.1 F (36.7 C)  SpO2: 97%  Weight: 164 lb 11.2 oz (74.7 kg)   Height: 5\' 6"  (1.676 m)   Body mass index is 26.58 kg/m. Physical Exam Vitals reviewed.  Constitutional:      Appearance: Normal appearance.  HENT:     Head: Normocephalic.     Nose: Nose normal.     Mouth/Throat:     Mouth: Mucous membranes are moist.     Pharynx: Oropharynx is clear.  Eyes:     Pupils: Pupils are equal, round, and reactive to light.  Cardiovascular:     Rate and Rhythm: Normal rate and regular rhythm.     Pulses: Normal pulses.     Heart sounds: Normal heart sounds.  Pulmonary:     Effort: Pulmonary effort is normal.     Breath sounds: Normal breath sounds.  Abdominal:  General: Abdomen is flat. Bowel sounds are normal.     Palpations: Abdomen is soft.  Musculoskeletal:        General: No swelling.     Cervical back: Neck supple.  Skin:    General: Skin is warm.  Neurological:     General: No focal deficit present.     Mental Status: She is alert.     Comments: Pleasantly confused Follows simple commands No Focal Deficits  Psychiatric:        Mood and Affect: Mood normal.        Thought Content: Thought content normal.    Labs reviewed: Recent Labs    09/09/20 0000 12/14/20 0108 02/10/21 0000  NA 142 143 137  K 4.2 4.3 4.0  CL 108 109* 106  CO2 25* 24* 22  BUN 18 18 16   CREATININE 0.9 0.7 1.0  CALCIUM 9.2 9.3 9.3   Recent Labs    09/09/20 0000 12/14/20 0108 02/10/21 0000  AST 15 13 17   ALT 16 14 16   ALKPHOS 120 128* 142*  ALBUMIN 3.5 3.6 3.9   Recent Labs    09/09/20 0000 12/14/20 0108 02/10/21 0000  WBC 6.0 8.3 7.9  NEUTROABS  --  5,686.00 6,707.00  HGB 14.9 15.2 15.6  HCT 43 47* 47*  PLT 253 337 319   Lab Results  Component Value Date   TSH 1.35 09/09/2020   Lab Results  Component Value Date   HGBA1C 5.6 04/24/2016   Lab Results  Component Value Date   CHOL 189 09/06/2015   HDL 65 09/06/2015   LDLCALC 99 09/06/2015   TRIG 126 09/06/2015    Significant Diagnostic Results in last 30 days:  No results  found.  Assessment/Plan Primary osteoarthritis involving multiple joints Continue Tylenol and Norco Dementia without behavioral disturbance (HCC) On Aricept and Namenda No Behavior issues Overactive bladder On Myrbetriq Duodenal ulcer without hemorrhage or perforation Continue Protonix   Family/ staff Communication:   Labs/tests ordered:

## 2021-08-14 ENCOUNTER — Encounter: Payer: Self-pay | Admitting: Nurse Practitioner

## 2021-08-14 ENCOUNTER — Non-Acute Institutional Stay (SKILLED_NURSING_FACILITY): Payer: Medicare Other | Admitting: Nurse Practitioner

## 2021-08-14 DIAGNOSIS — R269 Unspecified abnormalities of gait and mobility: Secondary | ICD-10-CM

## 2021-08-14 DIAGNOSIS — M159 Polyosteoarthritis, unspecified: Secondary | ICD-10-CM

## 2021-08-14 DIAGNOSIS — F0390 Unspecified dementia without behavioral disturbance: Secondary | ICD-10-CM | POA: Diagnosis not present

## 2021-08-14 DIAGNOSIS — N3281 Overactive bladder: Secondary | ICD-10-CM | POA: Diagnosis not present

## 2021-08-14 DIAGNOSIS — K269 Duodenal ulcer, unspecified as acute or chronic, without hemorrhage or perforation: Secondary | ICD-10-CM | POA: Diagnosis not present

## 2021-08-14 DIAGNOSIS — K5901 Slow transit constipation: Secondary | ICD-10-CM | POA: Diagnosis not present

## 2021-08-14 NOTE — Assessment & Plan Note (Addendum)
on Memantine, Donepezil for memory. MMSE 20/30 09/10/19. TSH 1.35 09/09/20, gradual weight loss about a pound or two monthly in the past 3-4 months. Supportive care, f/u dietary

## 2021-08-14 NOTE — Assessment & Plan Note (Signed)
managed on Myrbetriq 50mg  qd.

## 2021-08-14 NOTE — Assessment & Plan Note (Signed)
stable, on MiraLax daily.  

## 2021-08-14 NOTE — Assessment & Plan Note (Signed)
GERD/PUD/gastric erosions, stable, on Pantoprazole,  Hgb 15.6 02/10/21

## 2021-08-14 NOTE — Progress Notes (Signed)
Location:   [place] Room Number: 37 Place of Service:  SNF (31) Provider:  Dulse Rutan X, NP  Shealee Yordy X, NP  Patient Care Team: Nael Petrosyan X, NP as PCP - General (Internal Medicine) [name] Winda Summerall X, NP as Nurse Practitioner (Nurse Practitioner) [name], MD as Attending Physician (Urology) [name], MD as Consulting Physician (Gastroenterology)  Extended Emergency Contact Information Primary Emergency Contact: Greenly,[name] Address: [place] [address] [name] of [place] Phone: [phone] Relation: Son  Code Status:  DNR Goals of care: Advanced Directive information Advanced Directives 08/14/2021  Does Patient Have a Medical Advance Directive? Yes  Type of Paramedic of [place];Living will;Out of facility DNR (pink MOST or yellow form)  Does patient want to make changes to medical advance directive? No - Patient declined  Copy of [place] in Chart? Yes - validated most recent copy scanned in chart (See row information)  Pre-existing out of facility DNR order (yellow form or pink MOST form) Yellow form placed in chart (order not valid for inpatient use)     Chief Complaint  Patient presents with   Medical Management of Chronic Issues    Routine follow up visit.   Health Maintenance    Zoster vaccine    HPI:  Pt is a 87 y.o. female seen today for medical management of chronic diseases.    COVID 6/22, treated with Paxlovid, clinically recovered.              Dementia, on Memantine, Donepezil for memory. MMSE 20/30 09/10/19. TSH 1.35 09/09/20             Hx of OA, s/p L TKR 2013,  on Norco, Tylenol             Gait abnormalit, uses walker for ambulation.                    GERD/PUD/gastric erosions, stable, on Pantoprazole,  Hgb 15.6 02/10/21             Urinary frequency, managed on Myrbetriq 50mg  qd.              Constipation,  stable, on MiraLax daily.    Past Medical History:  Diagnosis Date   Allergic rhinitis due to pollen 07/01/2009   Anemia, unspecified 09/07/2010   Arthritis    Benign paroxysmal positional vertigo    Candidiasis 01/01/2012   Cramp of limb 01/11/2011   Disorder of bone and cartilage, unspecified 11/10/2010   Dizziness and giddiness 11/10/2010   Insomnia, unspecified 09/07/2010   Knee pain, left anterior    Lumbago 07/01/2009   Lump or mass in breast 08/01/2012   Memory loss 01/22/2012   Myalgia and myositis, unspecified 05/17/2011   Myopathy, unspecified    Osteoarthrosis, unspecified whether generalized or localized, lower leg    Other abnormal glucose 03/23/2010   Other chest pain 08/28/2012   Other disorders of bone and cartilage(733.99) 09/19/2012   Overactive bladder    Rash    inner legs- ? med allergy- to see PCP if doesnt improve by end of week   Rash and nonspecific skin eruption 10/28/2014   Right bundle branch block    Seborrheic keratoses 05/17/2011   Spasm of muscle 01/11/2011   Spinal stenosis, lumbar region, without neurogenic claudication    Stress incontinence 07/08/2014   Unspecified constipation 12/18/2011  Unspecified tinnitus    Unspecified urinary incontinence    Urgency of urination    Urinary frequency 09/18/2012   Past Surgical History:  Procedure Laterality Date   ABDOMINAL HYSTERECTOMY     APPENDECTOMY  1956   BACK SURGERY     lumbar-- ? type  Dr Tonita Cong   BREAST LUMPECTOMY  1993   breast cyst   BREAST SURGERY     right lumpectomy   CLEFT LIP REPAIR     with repair palate   ESOPHAGOGASTRODUODENOSCOPY Left 03/10/2013   Procedure: ESOPHAGOGASTRODUODENOSCOPY (EGD);  Surgeon: Beryle Beams, MD;  Location: [facility];  Service: Endoscopy;  Laterality: Left;   EYE SURGERY     bilateral cataract extraction with IOL   TOTAL KNEE ARTHROPLASTY  12/13/2011   Procedure: TOTAL KNEE ARTHROPLASTY;  Surgeon: Johnn Hai, MD;  Location: [facility];  Service: Orthopedics;   Laterality: Left;    No Known Allergies  Allergies as of 08/14/2021   No Known Allergies      Medication List        Accurate as of August 14, 2021 11:59 PM. If you have any questions, ask your nurse or doctor.          acetaminophen 500 MG tablet Commonly known as: TYLENOL Take 500 mg by mouth 2 (two) times daily.   donepezil 5 MG tablet Commonly known as: ARICEPT Take 5 mg by mouth at bedtime.   HYDROcodone-acetaminophen 5-325 MG tablet Commonly known as: NORCO/VICODIN Take 1 tablet by mouth 2 (two) times daily. For back pain   lactose free nutrition Liqd Take 237 mLs by mouth daily.   memantine 10 MG tablet Commonly known as: NAMENDA Take 10 mg by mouth 2 (two) times daily.   Myrbetriq 50 MG Tb24 tablet Generic drug: mirabegron ER Take 50 mg by mouth daily.   pantoprazole 40 MG tablet Commonly known as: Protonix Take 1 tablet (40 mg total) by mouth 2 (two) times daily.   polyethylene glycol 17 g packet Commonly known as: MIRALAX / GLYCOLAX Take 17 g by mouth daily.        Review of Systems  Unable to perform ROS: Dementia   Immunization History  Administered Date(s) Administered   Influenza Whole 05/27/2012, 06/10/2013, 06/06/2018   Influenza, High Dose Seasonal PF 05/30/2019   Influenza-Unspecified 06/14/2014, 05/26/2015, 06/13/2016, 06/17/2017, 06/08/2020, 06/14/2021   Moderna Sars-Covid-2 Vaccination 08/29/2019, 09/26/2019, 07/05/2020, 01/24/2021   Pfizer Covid-19 Vaccine Bivalent Booster 79yrs & up 05/16/2021   Pneumococcal Conjugate-13 06/24/2017   Pneumococcal Polysaccharide-23 10/03/2011   Td 10/03/2011   Zoster, Live 08/28/2007   Pertinent  Health Maintenance Due  Topic Date Due   INFLUENZA VACCINE  Completed   DEXA SCAN  Completed   Fall Risk 01/21/2014 09/15/2015 05/17/2017 03/27/2019  Falls in the past year? No No No (No Data)  Number of falls in past year - - - Emmi Telephone Survey Actual Response =    Functional Status  Survey:    Vitals:   08/14/21 1156  BP: 132/68  Pulse: 82  Resp: 18  Temp: 97.6 F (36.4 C)  SpO2: 96%  Weight: 162 lb 6.4 oz (73.7 kg)  Height: 5\' 6"  (1.676 m)   Body mass index is 26.21 kg/m. Physical Exam Vitals and nursing note reviewed.  Constitutional:      Appearance: Normal appearance.  HENT:     Head: Normocephalic and atraumatic.     Mouth/Throat:     Mouth: Mucous membranes are moist.  Eyes:  Extraocular Movements: Extraocular movements intact.     Conjunctiva/sclera:     Right eye: Right conjunctiva is not injected.     Left eye: Left conjunctiva is not injected.     Pupils: Pupils are equal, round, and reactive to light.  Cardiovascular:     Rate and Rhythm: Normal rate and regular rhythm.     Heart sounds: No murmur heard. Pulmonary:     Effort: Pulmonary effort is normal.     Breath sounds: No rales.  Abdominal:     General: Bowel sounds are normal.     Palpations: Abdomen is soft.     Tenderness: There is no abdominal tenderness.  Musculoskeletal:     Cervical back: Normal range of motion and neck supple.     Right lower leg: No edema.     Left lower leg: No edema.     Comments: Ambulates with walker. Left knee pain when walking.   Skin:    General: Skin is warm and dry.  Neurological:     General: No focal deficit present.     Mental Status: She is alert. Mental status is at baseline.     Gait: Gait abnormal.     Comments: Oriented to self, her room. Ambulates with walker with SBA  Psychiatric:        Mood and Affect: Mood normal.        Behavior: Behavior normal.    Labs reviewed: Recent Labs    09/09/20 0000 12/14/20 0108 02/10/21 0000  NA 142 143 137  K 4.2 4.3 4.0  CL 108 109* 106  CO2 25* 24* 22  BUN 18 18 16   CREATININE 0.9 0.7 1.0  CALCIUM 9.2 9.3 9.3   Recent Labs    09/09/20 0000 12/14/20 0108 02/10/21 0000  AST 15 13 17   ALT 16 14 16   ALKPHOS 120 128* 142*  ALBUMIN 3.5 3.6 3.9   Recent Labs     09/09/20 0000 12/14/20 0108 02/10/21 0000  WBC 6.0 8.3 7.9  NEUTROABS  --  5,686.00 6,707.00  HGB 14.9 15.2 15.6  HCT 43 47* 47*  PLT 253 337 319   Lab Results  Component Value Date   TSH 1.35 09/09/2020   Lab Results  Component Value Date   HGBA1C 5.6 04/24/2016   Lab Results  Component Value Date   CHOL 189 09/06/2015   HDL 65 09/06/2015   LDLCALC 99 09/06/2015   TRIG 126 09/06/2015    Significant Diagnostic Results in last 30 days:  No results found.  Assessment/Plan Duodenal ulcer without hemorrhage or perforation GERD/PUD/gastric erosions, stable, on Pantoprazole,  Hgb 15.6 02/10/21  Overactive bladder  managed on Myrbetriq 50mg  qd.   Slow transit constipation  stable, on MiraLax daily.   Gait abnormality  uses walker for ambulation.         Osteoarthritis, multiple sites s/p L TKR 2013,  on Norco, Tylenol  Dementia without behavioral disturbance (HCC) on Memantine, Donepezil for memory. MMSE 20/30 09/10/19. TSH 1.35 09/09/20, gradual weight loss about a pound or two monthly in the past 3-4 months. Supportive care, f/u dietary      Family/ staff Communication: plan of care reviewed with the patient and charge nurse.   Labs/tests ordered:  none  Time spend 35 minutes.

## 2021-08-14 NOTE — Assessment & Plan Note (Signed)
uses walker for ambulation.   

## 2021-08-14 NOTE — Assessment & Plan Note (Signed)
s/p L TKR 2013,  on Norco, Tylenol 

## 2021-08-15 ENCOUNTER — Encounter: Payer: Self-pay | Admitting: Nurse Practitioner

## 2021-08-29 ENCOUNTER — Encounter: Payer: Self-pay | Admitting: Nurse Practitioner

## 2021-08-29 ENCOUNTER — Non-Acute Institutional Stay (SKILLED_NURSING_FACILITY): Payer: Medicare Other | Admitting: Nurse Practitioner

## 2021-08-29 DIAGNOSIS — I10 Essential (primary) hypertension: Secondary | ICD-10-CM | POA: Diagnosis not present

## 2021-08-29 DIAGNOSIS — R634 Abnormal weight loss: Secondary | ICD-10-CM

## 2021-08-29 DIAGNOSIS — M159 Polyosteoarthritis, unspecified: Secondary | ICD-10-CM | POA: Diagnosis not present

## 2021-08-29 DIAGNOSIS — K5901 Slow transit constipation: Secondary | ICD-10-CM | POA: Diagnosis not present

## 2021-08-29 DIAGNOSIS — K257 Chronic gastric ulcer without hemorrhage or perforation: Secondary | ICD-10-CM

## 2021-08-29 DIAGNOSIS — F0390 Unspecified dementia without behavioral disturbance: Secondary | ICD-10-CM

## 2021-08-29 DIAGNOSIS — N3281 Overactive bladder: Secondary | ICD-10-CM | POA: Diagnosis not present

## 2021-08-29 DIAGNOSIS — R269 Unspecified abnormalities of gait and mobility: Secondary | ICD-10-CM | POA: Diagnosis not present

## 2021-08-29 NOTE — Progress Notes (Addendum)
°Location:   [facility] [name] °Nursing Home Room Number: 051 °Place of Service:  SNF (31) °Provider:  , Xie NP ° °,  X, NP ° °Patient Care Team: °,  X, NP as PCP - General (Internal Medicine) °[name] °,  X, NP as Nurse Practitioner (Nurse Practitioner) °Herrick, Benjamin W, MD as Attending Physician (Urology) °Hung, Patrick, MD as Consulting Physician (Gastroenterology) ° °Extended Emergency Contact Information °Primary Emergency Contact: Moten,[name] °Address: [address] °         [place] [address] [place] °Home Phone: [phone] °Relation: Son ° °Code Status:  DNR °Goals of care: Advanced Directive information °Advanced Directives 08/29/2021  °Does Patient Have a Medical Advance Directive? Yes  °Type of Advance Directive Healthcare Power of Attorney;Living will;Out of facility DNR (pink MOST or yellow form)  °Does patient want to make changes to medical advance directive? No - Patient declined  °Copy of Healthcare Power of Attorney in Chart? Yes - validated most recent copy scanned in chart (See row information)  °Pre-existing out of facility DNR order (yellow form or pink MOST form) Yellow form placed in chart (order not valid for inpatient use)  ° ° ° °Chief Complaint  °Patient presents with  °• Medical agement of Chronic Issues  °• Quality Metric Gaps  °  Shingrix  ° ° °HPI:  °Pt is a 88 y.o. female seen today for medical management of chronic diseases.   ° ° COVID 6/22, treated with Paxlovid, clinically recovered. gradual weight loss.  °            Dementia, on Memantine, Donepezil for memory. MMSE 20/30 09/10/19. TSH 1.35 09/09/20 °            Hx of OA, s/p L TKR 2013,  on Norco, Tylenol °            Gait abnormalit, uses walker for ambulation.        °            GERD/PUD/gastric erosions, stable, on Pantoprazole,  Hgb 15.6 02/10/21 °            Urinary frequency, managed on Myrbetriq 50mg qd.  °            Constipation, stable, on  MiraLax daily.  °  °Past Medical History:  °Diagnosis Date  °• Allergic rhinitis due to pollen 07/01/2009  °• Anemia, unspecified 09/07/2010  °• Arthritis   °• Benign paroxysmal positional vertigo   °• Candidiasis 01/01/2012  °• Cramp of limb 01/11/2011  °• Disorder of bone and cartilage, unspecified 11/10/2010  °• Dizziness and giddiness 11/10/2010  °• Insomnia, unspecified 09/07/2010  °• Knee pain, left anterior   °• Lumbago 07/01/2009  °• Lump or mass in breast 08/01/2012  °• Memory loss 01/22/2012  °• Myalgia and myositis, unspecified 05/17/2011  °• Myopathy, unspecified   °• Osteoarthrosis, unspecified whether generalized or localized, lower leg   °• Other abnormal glucose 03/23/2010  °• Other chest pain 08/28/2012  °• Other disorders of bone and cartilage(733.99) 09/19/2012  °• Overactive bladder   °• Rash   ° inner legs- ? med allergy- to see PCP if doesnt improve by end of week  °• Rash and nonspecific skin eruption 10/28/2014  °• Right bundle branch block   °• Seborrheic keratoses 05/17/2011  °• Spasm of muscle 01/11/2011  °• Spinal stenosis, lumbar region, without neurogenic claudication   °• Stress incontinence 07/08/2014  °• Unspecified constipation 12/18/2011  °• Unspecified tinnitus   °•   Unspecified urinary incontinence   °• Urgency of urination   °• Urinary frequency 09/18/2012  ° °Past Surgical History:  °Procedure Laterality Date  °• ABDOMINAL HYSTERECTOMY    °• APPENDECTOMY  1956  °• BACK SURGERY    ° lumbar-- ? type  Dr Beane  °• BREAST LUMPECTOMY  1993  ° breast cyst  °• BREAST SURGERY    ° right lumpectomy  °• CLEFT LIP REPAIR    ° with repair palate  °• ESOPHAGOGASTRODUODENOSCOPY Left 03/10/2013  ° Procedure: ESOPHAGOGASTRODUODENOSCOPY (EGD);  Surgeon: Patrick D Hung, MD;  Location: [facility];  Service: Endoscopy;  Laterality: Left;  °• EYE SURGERY    ° bilateral cataract extraction with IOL  °• TOTAL KNEE ARTHROPLASTY  12/13/2011  ° Procedure: TOTAL KNEE ARTHROPLASTY;  Surgeon: Jeffrey C Beane, MD;  Location: [facility];  Service: Orthopedics;  Laterality: Left;  ° ° °No Known Allergies ° °Allergies as of 08/29/2021   °No Known Allergies °  ° °  °Medication List  °  ° °  ° Accurate as of August 29, 2021 11:59 PM. If you have any questions, ask your nurse or doctor.  °  °  ° °  ° °acetaminophen 500 MG tablet °Commonly known as: TYLENOL °Take 500 mg by mouth 2 (two) times daily. °  °donepezil 5 MG tablet °Commonly known as: ARICEPT °Take 5 mg by mouth at bedtime. °  °HYDROcodone-acetaminophen 5-325 MG tablet °Commonly known as: NORCO/VICODIN °Take 1 tablet by mouth 2 (two) times daily. For back pain °  °lactose free nutrition Liqd °Take 237 mLs by mouth daily. °  °memantine 10 MG tablet °Commonly known as: NAMENDA °Take 10 mg by mouth 2 (two) times daily. °  °Myrbetriq 50 MG Tb24 tablet °Generic drug: mirabegron ER °Take 50 mg by mouth daily. °  °pantoprazole 40 MG tablet °Commonly known as: Protonix °Take 1 tablet (40 mg total) by mouth 2 (two) times daily. °  °polyethylene glycol 17 g packet °Commonly known as: MIRALAX / GLYCOLAX °Take 17 g by mouth daily. °  ° °  ° ° °Review of Systems  °Unable to perform ROS: Dementia  ° °Immunization History  °Administered Date(s) Administered  °• Influenza Whole 05/27/2012, 06/10/2013, 06/06/2018  °• Influenza, High Dose Seasonal PF 05/30/2019  °• Influenza-Unspecified 06/14/2014, 05/26/2015, 06/13/2016, 06/17/2017, 06/08/2020, 06/14/2021  °• Moderna Sars-Covid-2 Vaccination 08/29/2019, 09/26/2019, 07/05/2020, 01/24/2021  °• Pfizer Covid-19 Vaccine Bivalent Booster 12yrs & up 05/16/2021  °• Pneumococcal Conjugate-13 06/24/2017  °• Pneumococcal Polysaccharide-23 10/03/2011  °• Td 10/03/2011  °• Zoster, Live 08/28/2007  ° °Pertinent  Health Maintenance Due  °Topic Date Due  °• INFLUENZA VACCINE  Completed  °• DEXA SCAN  Completed  ° °Fall Risk 01/21/2014 09/15/2015 05/17/2017 03/27/2019  °Falls in the past year? No No No (No Data)  °Number of falls in past year - - - Emmi Telephone Survey Actual  Response =   ° °Functional Status Survey: °  ° °Vitals:  ° 08/29/21 1108  °BP: 96/64  °Pulse: 77  °Resp: 18  °Temp: (!) 97 °F (36.1 °C)  °SpO2: 95%  °Weight: 160 lb (72.6 kg)  °Height: 5' 6" (1.676 m)  ° °Body mass index is 25.82 kg/m². °Physical Exam °Vitals and nursing note reviewed.  °Constitutional:   °   Appearance: Normal appearance.  °   Comments: Weight loss #5Ibs in 3 months.   °HENT:  °   Head: Normocephalic and atraumatic.  °   Mouth/Throat:  °   Mouth:   Mouth: Mucous membranes are moist.  Eyes:     Extraocular Movements: Extraocular movements intact.     Conjunctiva/sclera:     Right eye: Right conjunctiva is not injected.     Left eye: Left conjunctiva is not injected.     Pupils: Pupils are equal, round, and reactive to light.  Cardiovascular:     Rate and Rhythm: Normal rate and regular rhythm.     Heart sounds: No murmur heard. Pulmonary:     Effort: Pulmonary effort is normal.     Breath sounds: No rales.  Abdominal:     General: Bowel sounds are normal.     Palpations: Abdomen is soft.     Tenderness: There is no abdominal tenderness.  Musculoskeletal:     Cervical back: Normal range of motion and neck supple.     Right lower leg: No edema.     Left lower leg: No edema.     Comments: Ambulates with walker. Left knee pain when walking.   Skin:    General: Skin is warm and dry.  Neurological:     General: No focal deficit present.     Mental Status: She is alert. Mental status is at baseline.     Gait: Gait abnormal.     Comments: Oriented to self, her room. Ambulates with walker with SBA  Psychiatric:        Mood and Affect: Mood normal.        Behavior: Behavior normal.    Labs reviewed: Recent Labs    09/09/20 0000 12/14/20 0108 02/10/21 0000  NA 142 143 137  K 4.2 4.3 4.0  CL 108 109* 106  CO2 25* 24* 22  BUN _0 CREATININE 0.9 0.7 1.0  CALCIUM 9.2 9.3 9.3   Recent Labs    09/09/20 0000 12/14/20 0108 02/10/21 0000  AST _1 ALT _2 ALKPHOS 120 128* 142*  ALBUMIN 3.5 3.6 3.9   Recent Labs    09/09/20 0000 12/14/20 0108 02/10/21 0000  WBC 6.0 8.3 7.9  NEUTROABS  --  5,686.00 6,707.00  HGB 14.9 15.2 15.6  HCT 43 47* 47*  PLT 253 337 319   Lab Results  Component Value Date   TSH 1.35 09/09/2020   Lab Results  Component Value Date   HGBA1C 5.6 04/24/2016   Lab Results  Component Value Date   CHOL 189 09/06/2015   HDL 65 09/06/2015   LDLCALC 99 09/06/2015   TRIG 126 09/06/2015    Significant Diagnostic Results in last 30 days:  No results found.  Assessment/Plan Weight loss COVID 6/22, treated with Paxlovid, clinically recovered. gradual weight loss. Will update CBC/diff, CMP/eGFR, TSH  Dementia without behavioral disturbance (HCC) on Memantine, Donepezil for memory. MMSE 20/30 09/10/19. TSH 1.35 09/09/20  Osteoarthritis, multiple sites  s/p L TKR 2013,  on Norco, Tylenol  Gait abnormality uses walker for ambulation.    Gastric erosions GERD/PUD/gastric erosions, stable, on Pantoprazole,  Hgb 15.6 02/10/21  Slow transit constipation Stable, continue MiraLax.   Overactive bladder managed on Myrbetriq 43m qd for chronic urinary frequency.  HTN (hypertension) Low Bp measurements, no noted dizziness, headache, chest pain, palpitation, or generalized weakness. Will update CBC/diff, CMP/eGFR, TSH. VS daily x5 days.     Family/ staff Communication: plan of care reviewed with the patient and charge nurse.   Labs/tests ordered:  CBC/diff, CMP/eGFR, TSH  Time spend 35 minutes.

## 2021-08-31 ENCOUNTER — Encounter: Payer: Self-pay | Admitting: Nurse Practitioner

## 2021-08-31 DIAGNOSIS — R634 Abnormal weight loss: Secondary | ICD-10-CM | POA: Insufficient documentation

## 2021-08-31 NOTE — Assessment & Plan Note (Addendum)
COVID 6/22, treated with Paxlovid, clinically recovered. gradual weight loss. Will update CBC/diff, CMP/eGFR, TSH

## 2021-08-31 NOTE — Assessment & Plan Note (Signed)
Stable, continue MiraLax.  °

## 2021-08-31 NOTE — Assessment & Plan Note (Signed)
uses walker for ambulation.   

## 2021-08-31 NOTE — Assessment & Plan Note (Signed)
s/p L TKR 2013,  on Norco, Tylenol 

## 2021-08-31 NOTE — Assessment & Plan Note (Signed)
managed on Myrbetriq 50mg  qd for chronic urinary frequency.

## 2021-08-31 NOTE — Assessment & Plan Note (Signed)
on Memantine, Donepezil for memory. MMSE 20/30 09/10/19. TSH 1.35 09/09/20

## 2021-08-31 NOTE — Assessment & Plan Note (Signed)
GERD/PUD/gastric erosions, stable, on Pantoprazole,  Hgb 15.6 02/10/21

## 2021-09-04 ENCOUNTER — Encounter: Payer: Self-pay | Admitting: Nurse Practitioner

## 2021-09-04 NOTE — Assessment & Plan Note (Addendum)
Low Bp measurements, no noted dizziness, headache, chest pain, palpitation, or generalized weakness. Will update CBC/diff, CMP/eGFR, TSH. VS daily x5 days.

## 2021-09-05 DIAGNOSIS — I10 Essential (primary) hypertension: Secondary | ICD-10-CM | POA: Diagnosis not present

## 2021-09-05 LAB — COMPREHENSIVE METABOLIC PANEL
Albumin: 3.3 — AB (ref 3.5–5.0)
Calcium: 9.1 (ref 8.7–10.7)
Globulin: 2.4

## 2021-09-05 LAB — BASIC METABOLIC PANEL
BUN: 15 (ref 4–21)
CO2: 26 — AB (ref 13–22)
Chloride: 107 (ref 99–108)
Creatinine: 0.7 (ref 0.5–1.1)
Glucose: 94
Potassium: 4.1 (ref 3.4–5.3)
Sodium: 141 (ref 137–147)

## 2021-09-05 LAB — CBC AND DIFFERENTIAL
HCT: 40 (ref 36–46)
Hemoglobin: 13.5 (ref 12.0–16.0)
Platelets: 290 (ref 150–399)
WBC: 6.9

## 2021-09-05 LAB — HEPATIC FUNCTION PANEL
ALT: 6 — AB (ref 7–35)
AST: 11 — AB (ref 13–35)
Alkaline Phosphatase: 107 (ref 25–125)

## 2021-09-05 LAB — CBC: RBC: 4.61 (ref 3.87–5.11)

## 2021-09-05 LAB — TSH: TSH: 1.11 (ref 0.41–5.90)

## 2021-10-17 ENCOUNTER — Non-Acute Institutional Stay (SKILLED_NURSING_FACILITY): Payer: Medicare Other | Admitting: Internal Medicine

## 2021-10-17 ENCOUNTER — Encounter: Payer: Self-pay | Admitting: Internal Medicine

## 2021-10-17 DIAGNOSIS — F0390 Unspecified dementia without behavioral disturbance: Secondary | ICD-10-CM | POA: Diagnosis not present

## 2021-10-17 DIAGNOSIS — K269 Duodenal ulcer, unspecified as acute or chronic, without hemorrhage or perforation: Secondary | ICD-10-CM | POA: Diagnosis not present

## 2021-10-17 DIAGNOSIS — M150 Primary generalized (osteo)arthritis: Secondary | ICD-10-CM

## 2021-10-17 DIAGNOSIS — M159 Polyosteoarthritis, unspecified: Secondary | ICD-10-CM | POA: Diagnosis not present

## 2021-10-17 DIAGNOSIS — N3281 Overactive bladder: Secondary | ICD-10-CM

## 2021-10-17 NOTE — Progress Notes (Signed)
Location:  [facility] of Service:  SNF (31)  Provider: Virgie Dad   Code Status: DNR Goals of Care:  Advanced Directives 08/29/2021  Does Patient Have a Medical Advance Directive? Yes  Type of Paramedic of [place];Living will;Out of facility DNR (pink MOST or yellow form)  Does patient want to make changes to medical advance directive? No - Patient declined  Copy of [place] in Chart? Yes - validated most recent copy scanned in chart (See row information)  Pre-existing out of facility DNR order (yellow form or pink MOST form) Yellow form placed in chart (order not valid for inpatient use)     Chief Complaint  Patient presents with   Medical Management of Chronic Issues    HPI: Patient is a 88 y.o. female seen today for medical management of chronic diseases.     She has h/o Lumbago, Stress incontinence, Vertigo, h/o Duodenal Ulcer and dementia and Arthritis    She  is stable.No new Nursing issues. No Behavior issues Her weight is stable Wheelchair dependent No Falls Wt Readings from Last 3 Encounters:  10/17/21 161 lb 11.2 oz (73.3 kg)  08/29/21 160 lb (72.6 kg)  08/14/21 162 lb 6.4 oz (73.7 kg)    Past Medical History:  Diagnosis Date   Allergic rhinitis due to pollen 07/01/2009   Anemia, unspecified 09/07/2010   Arthritis    Benign paroxysmal positional vertigo    Candidiasis 01/01/2012   Cramp of limb 01/11/2011   Disorder of bone and cartilage, unspecified 11/10/2010   Dizziness and giddiness 11/10/2010   Insomnia, unspecified 09/07/2010   Knee pain, left anterior    Lumbago 07/01/2009   Lump or mass in breast 08/01/2012   Memory loss 01/22/2012   Myalgia and myositis, unspecified 05/17/2011   Myopathy, unspecified    Osteoarthrosis, unspecified whether generalized or localized, lower leg    Other abnormal glucose 03/23/2010   Other chest pain 08/28/2012   Other disorders of bone and cartilage(733.99)  09/19/2012   Overactive bladder    Rash    inner legs- ? med allergy- to see PCP if doesnt improve by end of week   Rash and nonspecific skin eruption 10/28/2014   Right bundle branch block    Seborrheic keratoses 05/17/2011   Spasm of muscle 01/11/2011   Spinal stenosis, lumbar region, without neurogenic claudication    Stress incontinence 07/08/2014   Unspecified constipation 12/18/2011   Unspecified tinnitus    Unspecified urinary incontinence    Urgency of urination    Urinary frequency 09/18/2012    Past Surgical History:  Procedure Laterality Date   ABDOMINAL HYSTERECTOMY     APPENDECTOMY  1956   BACK SURGERY     lumbar-- ? type  Dr Tonita Cong   BREAST LUMPECTOMY  1993   breast cyst   BREAST SURGERY     right lumpectomy   CLEFT LIP REPAIR     with repair palate   ESOPHAGOGASTRODUODENOSCOPY Left 03/10/2013   Procedure: ESOPHAGOGASTRODUODENOSCOPY (EGD);  Surgeon: Beryle Beams, MD;  Location: [facility];  Service: Endoscopy;  Laterality: Left;   EYE SURGERY     bilateral cataract extraction with IOL   TOTAL KNEE ARTHROPLASTY  12/13/2011   Procedure: TOTAL KNEE ARTHROPLASTY;  Surgeon: Johnn Hai, MD;  Location: [facility];  Service: Orthopedics;  Laterality: Left;    No Known Allergies  Outpatient Encounter Medications as of 10/17/2021  Medication Sig   acetaminophen (  TYLENOL) 500 MG tablet Take 500 mg by mouth 2 (two) times daily.   donepezil (ARICEPT) 5 MG tablet Take 5 mg by mouth at bedtime.   HYDROcodone-acetaminophen (NORCO/VICODIN) 5-325 MG tablet Take 1 tablet by mouth 2 (two) times daily. For back pain   lactose free nutrition (BOOST PLUS) LIQD Take 237 mLs by mouth daily.   memantine (NAMENDA) 10 MG tablet Take 10 mg by mouth 2 (two) times daily.   MYRBETRIQ 50 MG TB24 tablet Take 50 mg by mouth daily.    pantoprazole (PROTONIX) 40 MG tablet Take 1 tablet (40 mg total) by mouth 2 (two) times daily.   polyethylene glycol (MIRALAX / GLYCOLAX) 17 g packet Take 17 g by  mouth daily.   No facility-administered encounter medications on file as of 10/17/2021.    Review of Systems:  Review of Systems  Unable to perform ROS: Dementia   Health Maintenance  Topic Date Due   Zoster Vaccines- Shingrix (1 of 2) Never done   TETANUS/TDAP  10/02/2021   Pneumonia Vaccine 62+ Years old  Completed   INFLUENZA VACCINE  Completed   DEXA SCAN  Completed   COVID-19 Vaccine  Completed   HPV VACCINES  Aged Out    Physical Exam: Vitals:   10/17/21 1448  BP: 138/72  Pulse: 74  Resp: 17  Temp: 98.5 F (36.9 C)  Weight: 161 lb 11.2 oz (73.3 kg)   Body mass index is 26.1 kg/m. Physical Exam Vitals reviewed.  Constitutional:      Appearance: Normal appearance.  HENT:     Head: Normocephalic.     Nose: Nose normal.     Mouth/Throat:     Mouth: Mucous membranes are moist.     Pharynx: Oropharynx is clear.  Eyes:     Pupils: Pupils are equal, round, and reactive to light.  Cardiovascular:     Rate and Rhythm: Normal rate and regular rhythm.     Pulses: Normal pulses.     Heart sounds: Normal heart sounds. No murmur heard. Pulmonary:     Effort: Pulmonary effort is normal.     Breath sounds: Normal breath sounds.  Abdominal:     General: Abdomen is flat. Bowel sounds are normal.     Palpations: Abdomen is soft.  Musculoskeletal:        General: Swelling present.     Cervical back: Neck supple.  Skin:    General: Skin is warm.  Neurological:     General: No focal deficit present.     Mental Status: She is alert.     Comments:  Pleasantly confused Follows simple commands No Focal Deficits   Psychiatric:        Mood and Affect: Mood normal.        Thought Content: Thought content normal.    Labs reviewed: Basic Metabolic Panel: Recent Labs    12/14/20 0108 02/10/21 0000  NA 143 137  K 4.3 4.0  CL 109* 106  CO2 24* 22  BUN 18 16  CREATININE 0.7 1.0  CALCIUM 9.3 9.3   Liver Function Tests: Recent Labs    12/14/20 0108 02/10/21 0000   AST 13 17  ALT 14 16  ALKPHOS 128* 142*  ALBUMIN 3.6 3.9   No results for input(s): LIPASE, AMYLASE in the last 8760 hours. No results for input(s): AMMONIA in the last 8760 hours. CBC: Recent Labs    12/14/20 0108 02/10/21 0000  WBC 8.3 7.9  NEUTROABS 5,686.00 6,707.00  HGB  15.2 15.6  HCT 47* 47*  PLT 337 319   Lipid Panel: No results for input(s): CHOL, HDL, LDLCALC, TRIG, CHOLHDL, LDLDIRECT in the last 8760 hours. Lab Results  Component Value Date   HGBA1C 5.6 04/24/2016    Procedures since last visit: No results found.  Assessment/Plan 1. Primary osteoarthritis involving multiple joint On Tylenol and Norco  2. Dementia without behavioral disturbance (HCC) On Aricept and Namenda No Behavior issues   3. Overactive bladder On Myrbetriq  4. Duodenal ulcer without hemorrhage or perforation Protonix  Labs don ein 01/23 all were normal  Labs/tests ordered:  * No order type specified *  Virgie Dad, MD

## 2021-10-30 ENCOUNTER — Non-Acute Institutional Stay (SKILLED_NURSING_FACILITY): Payer: Medicare Other | Admitting: Nurse Practitioner

## 2021-10-30 ENCOUNTER — Encounter: Payer: Self-pay | Admitting: Nurse Practitioner

## 2021-10-30 DIAGNOSIS — R269 Unspecified abnormalities of gait and mobility: Secondary | ICD-10-CM

## 2021-10-30 DIAGNOSIS — N3281 Overactive bladder: Secondary | ICD-10-CM

## 2021-10-30 DIAGNOSIS — K269 Duodenal ulcer, unspecified as acute or chronic, without hemorrhage or perforation: Secondary | ICD-10-CM

## 2021-10-30 DIAGNOSIS — F0390 Unspecified dementia without behavioral disturbance: Secondary | ICD-10-CM

## 2021-10-30 DIAGNOSIS — K5901 Slow transit constipation: Secondary | ICD-10-CM

## 2021-10-30 DIAGNOSIS — M159 Polyosteoarthritis, unspecified: Secondary | ICD-10-CM

## 2021-10-30 NOTE — Progress Notes (Signed)
Location:   [facility] Room Number: 51-A Place of Service:  SNF (31) Provider:  Baylie Drakes, NP    Patient Care Team: Ayanah Snader X, NP as PCP - General (Internal Medicine) [name] Chade Pitner X, NP as Nurse Practitioner (Nurse Practitioner) [name], MD as Attending Physician (Urology) [name], MD as Consulting Physician (Gastroenterology)  Extended Emergency Contact Information Primary Emergency Contact: Haros,[name] Address: [place] [address] [name] of [place] Phone: [phone] Relation: Son  Code Status:  DNR Goals of care: Advanced Directive information Advanced Directives 10/30/2021  Does Patient Have a Medical Advance Directive? Yes  Type of Paramedic of [place];Living will;Out of facility DNR (pink MOST or yellow form)  Does patient want to make changes to medical advance directive? No - Patient declined  Copy of [place] in Chart? Yes - validated most recent copy scanned in chart (See row information)  Pre-existing out of facility DNR order (yellow form or pink MOST form) -     Chief Complaint  Patient presents with   Medical Management of Chronic Issues    Routine Visit.    Immunizations    Discuss the need for Shingrix vaccine, and Tetanus vaccine.    HPI:  Pt is a 88 y.o. female seen today for medical management of chronic diseases.      COVID 6/22, treated with Paxlovid             Dementia, on Memantine, Donepezil for memory. MMSE 20/30 09/10/19. TSH 1.11 09/05/21             Hx of OA, s/p L TKR 2013,  on Norco, Tylenol             Gait abnormalit, uses walker for ambulation.                    GERD/PUD/gastric erosions, stable, on Pantoprazole,  Hgb 13.5 09/05/21             Urinary frequency, managed on Myrbetriq '50mg'$  qd.              Constipation, stable, on MiraLax daily.     Past Medical History:  Diagnosis Date    Allergic rhinitis due to pollen 07/01/2009   Anemia, unspecified 09/07/2010   Arthritis    Benign paroxysmal positional vertigo    Candidiasis 01/01/2012   Cramp of limb 01/11/2011   Disorder of bone and cartilage, unspecified 11/10/2010   Dizziness and giddiness 11/10/2010   Insomnia, unspecified 09/07/2010   Knee pain, left anterior    Lumbago 07/01/2009   Lump or mass in breast 08/01/2012   Memory loss 01/22/2012   Myalgia and myositis, unspecified 05/17/2011   Myopathy, unspecified    Osteoarthrosis, unspecified whether generalized or localized, lower leg    Other abnormal glucose 03/23/2010   Other chest pain 08/28/2012   Other disorders of bone and cartilage(733.99) 09/19/2012   Overactive bladder    Rash    inner legs- ? med allergy- to see PCP if doesnt improve by end of week   Rash and nonspecific skin eruption 10/28/2014   Right bundle branch block    Seborrheic keratoses 05/17/2011   Spasm of muscle 01/11/2011   Spinal stenosis, lumbar region, without neurogenic claudication    Stress incontinence 07/08/2014   Unspecified constipation 12/18/2011   Unspecified tinnitus    Unspecified  urinary incontinence    Urgency of urination    Urinary frequency 09/18/2012   Past Surgical History:  Procedure Laterality Date   ABDOMINAL HYSTERECTOMY     APPENDECTOMY  1956   BACK SURGERY     lumbar-- ? type  Dr Tonita Cong   BREAST LUMPECTOMY  1993   breast cyst   BREAST SURGERY     right lumpectomy   CLEFT LIP REPAIR     with repair palate   ESOPHAGOGASTRODUODENOSCOPY Left 03/10/2013   Procedure: ESOPHAGOGASTRODUODENOSCOPY (EGD);  Surgeon: Beryle Beams, MD;  Location: [facility];  Service: Endoscopy;  Laterality: Left;   EYE SURGERY     bilateral cataract extraction with IOL   TOTAL KNEE ARTHROPLASTY  12/13/2011   Procedure: TOTAL KNEE ARTHROPLASTY;  Surgeon: Johnn Hai, MD;  Location: [facility];  Service: Orthopedics;  Laterality: Left;    No Known Allergies  Allergies as of 10/30/2021    No Known Allergies      Medication List        Accurate as of October 30, 2021 11:59 PM. If you have any questions, ask your nurse or doctor.          STOP taking these medications    lactose free nutrition Liqd Stopped by: Zoelle Markus X Aibhlinn Kalmar, NP       TAKE these medications    acetaminophen 500 MG tablet Commonly known as: TYLENOL Take 500 mg by mouth 2 (two) times daily.   donepezil 5 MG tablet Commonly known as: ARICEPT Take 5 mg by mouth at bedtime.   HYDROcodone-acetaminophen 5-325 MG tablet Commonly known as: NORCO/VICODIN Take 1 tablet by mouth 2 (two) times daily. For back pain   memantine 10 MG tablet Commonly known as: NAMENDA Take 10 mg by mouth 2 (two) times daily.   Myrbetriq 50 MG Tb24 tablet Generic drug: mirabegron ER Take 50 mg by mouth daily.   pantoprazole 40 MG tablet Commonly known as: Protonix Take 1 tablet (40 mg total) by mouth 2 (two) times daily.   polyethylene glycol 17 g packet Commonly known as: MIRALAX / GLYCOLAX Take 17 g by mouth daily.        Review of Systems  Unable to perform ROS: Dementia   Immunization History  Administered Date(s) Administered   Influenza Whole 05/27/2012, 06/10/2013, 06/06/2018   Influenza, High Dose Seasonal PF 05/30/2019   Influenza-Unspecified 06/14/2014, 05/26/2015, 06/13/2016, 06/17/2017, 06/08/2020, 06/14/2021   Moderna Sars-Covid-2 Vaccination 08/29/2019, 09/26/2019, 07/05/2020, 01/24/2021   Pfizer Covid-19 Vaccine Bivalent Booster 17yr & up 05/16/2021   Pneumococcal Conjugate-13 06/24/2017   Pneumococcal Polysaccharide-23 10/03/2011   Td 10/03/2011   Zoster, Live 08/28/2007   Pertinent  Health Maintenance Due  Topic Date Due   INFLUENZA VACCINE  Completed   DEXA SCAN  Completed   Fall Risk 01/21/2014 09/15/2015 05/17/2017 03/27/2019  Falls in the past year? No No No (No Data)  Number of falls in past year - - - [place] Telephone Survey Actual Response =    Functional Status Survey:     Vitals:   10/30/21 1012  BP: 124/70  Pulse: 80  Resp: 16  Temp: 98.4 F (36.9 C)  SpO2: 93%  Weight: 161 lb 1.6 oz (73.1 kg)  Height: '5\' 6"'$  (1.676 m)   Body mass index is 26 kg/m. Physical Exam Vitals and nursing note reviewed.  Constitutional:      Appearance: Normal appearance.  HENT:     Head: Normocephalic and atraumatic.     Mouth/Throat:  Mouth: Mucous membranes are moist.  Eyes:     Extraocular Movements: Extraocular movements intact.     Conjunctiva/sclera:     Right eye: Right conjunctiva is not injected.     Left eye: Left conjunctiva is not injected.     Pupils: Pupils are equal, round, and reactive to light.  Cardiovascular:     Rate and Rhythm: Normal rate and regular rhythm.     Heart sounds: No murmur heard. Pulmonary:     Effort: Pulmonary effort is normal.     Breath sounds: No rales.  Abdominal:     General: Bowel sounds are normal.     Palpations: Abdomen is soft.     Tenderness: There is no abdominal tenderness.  Musculoskeletal:     Cervical back: Normal range of motion and neck supple.     Right lower leg: No edema.     Left lower leg: No edema.     Comments: Ambulates with walker. Left knee pain when walking.   Skin:    General: Skin is warm and dry.  Neurological:     General: No focal deficit present.     Mental Status: She is alert. Mental status is at baseline.     Gait: Gait abnormal.     Comments: Oriented to self, her room. Ambulates with walker with SBA  Psychiatric:        Mood and Affect: Mood normal.        Behavior: Behavior normal.    Labs reviewed: Recent Labs    12/14/20 0108 02/10/21 0000 09/05/21 0000  NA 143 137 141  K 4.3 4.0 4.1  CL 109* 106 107  CO2 24* 22 26*  BUN '18 16 15  '$ CREATININE 0.7 1.0 0.7  CALCIUM 9.3 9.3 9.1   Recent Labs    12/14/20 0108 02/10/21 0000 09/05/21 0000  AST 13 17 11*  ALT 14 16 6*  ALKPHOS 128* 142* 107  ALBUMIN 3.6 3.9 3.3*   Recent Labs    12/14/20 0108  02/10/21 0000 09/05/21 0000  WBC 8.3 7.9 6.9  NEUTROABS 5,686.00 6,707.00  --   HGB 15.2 15.6 13.5  HCT 47* 47* 40  PLT 337 319 290   Lab Results  Component Value Date   TSH 1.11 09/05/2021   Lab Results  Component Value Date   HGBA1C 5.6 04/24/2016   Lab Results  Component Value Date   CHOL 189 09/06/2015   HDL 65 09/06/2015   LDLCALC 99 09/06/2015   TRIG 126 09/06/2015    Significant Diagnostic Results in last 30 days:  No results found.  Assessment/Plan Duodenal ulcer without hemorrhage or perforation GERD/PUD/gastric erosions, stable, on Pantoprazole,  Hgb 13.5 09/05/21  Overactive bladder managed on Myrbetriq '50mg'$  qd.   Slow transit constipation stable, on MiraLax daily.   Gait abnormality uses walker for ambulation.         Osteoarthritis, multiple sites  s/p L TKR 2013,  on Norco, Tylenol  Dementia without behavioral disturbance (HCC) on Memantine, Donepezil for memory. MMSE 20/30 09/10/19. TSH 1.11 09/05/21     Family/ staff Communication: plan of care reviewed with the patient and charge nurse.   Labs/tests ordered:  none  Time spend 25 minutes.

## 2021-10-30 NOTE — Assessment & Plan Note (Signed)
uses walker for ambulation.   

## 2021-10-30 NOTE — Assessment & Plan Note (Signed)
managed on Myrbetriq '50mg'$  qd.  ?

## 2021-10-30 NOTE — Assessment & Plan Note (Signed)
GERD/PUD/gastric erosions, stable, on Pantoprazole,  Hgb 13.5 09/05/21 

## 2021-10-30 NOTE — Assessment & Plan Note (Signed)
s/p L TKR 2013,  on Norco, Tylenol 

## 2021-10-30 NOTE — Assessment & Plan Note (Signed)
stable, on MiraLax daily.  

## 2021-10-30 NOTE — Assessment & Plan Note (Signed)
on Memantine, Donepezil for memory. MMSE 20/30 09/10/19. TSH 1.11 09/05/21 

## 2021-10-31 ENCOUNTER — Encounter: Payer: Self-pay | Admitting: Nurse Practitioner

## 2021-10-31 ENCOUNTER — Non-Acute Institutional Stay (SKILLED_NURSING_FACILITY): Payer: Medicare Other | Admitting: Nurse Practitioner

## 2021-10-31 DIAGNOSIS — Z0000 Encounter for general adult medical examination without abnormal findings: Secondary | ICD-10-CM | POA: Diagnosis not present

## 2021-10-31 NOTE — Progress Notes (Signed)
Subjective:   Janet Hernandez is a 88 y.o. female who presents for Medicare Annual (Subsequent) preventive examination at SNF [facility]       Objective:    Today's Vitals   10/31/21 1114 10/31/21 1538  BP: 103/80   Pulse: 66   Resp: 16   Temp: (!) 97.3 F (36.3 C)   SpO2: 93%   Weight: 161 lb 1.6 oz (73.1 kg)   Height: '5\' 6"'$  (1.676 m)   PainSc:  2    Body mass index is 26 kg/m.  Advanced Directives 10/31/2021 10/30/2021 08/29/2021 08/14/2021 06/27/2021 05/31/2021 04/11/2021  Does Patient Have a Medical Advance Directive? Yes Yes Yes Yes Yes Yes Yes  Type of Paramedic of [place];Living will;Out of facility DNR (pink MOST or yellow form) [place];Living will;Out of facility DNR (pink MOST or yellow form) [place];Living will;Out of facility DNR (pink MOST or yellow form) [place];Living will;Out of facility DNR (pink MOST or yellow form) [place];Living will;Out of facility DNR (pink MOST or yellow form) [place];Living will;Out of facility DNR (pink MOST or yellow form) Living will;Out of facility DNR (pink MOST or yellow form);Healthcare Power of Attorney  Does patient want to make changes to medical advance directive? No - Patient declined No - Patient declined No - Patient declined No - Patient declined No - Patient declined No - Patient declined No - Patient declined  Copy of [place] in Chart? Yes - validated most recent copy scanned in chart (See row information) Yes - validated most recent copy scanned in chart (See row information) Yes - validated most recent copy scanned in chart (See row information) Yes - validated most recent copy scanned in chart (See row information) Yes - validated most recent copy scanned in chart (See row information) Yes - validated most recent copy scanned in chart (See row information) Yes - validated most recent copy  scanned in chart (See row information)  Pre-existing out of facility DNR order (yellow form or pink MOST form) - - Yellow form placed in chart (order not valid for inpatient use) Yellow form placed in chart (order not valid for inpatient use) Yellow form placed in chart (order not valid for inpatient use) Yellow form placed in chart (order not valid for inpatient use) Yellow form placed in chart (order not valid for inpatient use)    Current Medications (verified) Outpatient Encounter Medications as of 10/31/2021  Medication Sig   acetaminophen (TYLENOL) 500 MG tablet Take 500 mg by mouth 2 (two) times daily.   donepezil (ARICEPT) 5 MG tablet Take 5 mg by mouth at bedtime.   HYDROcodone-acetaminophen (NORCO/VICODIN) 5-325 MG tablet Take 1 tablet by mouth 2 (two) times daily. For back pain   memantine (NAMENDA) 10 MG tablet Take 10 mg by mouth 2 (two) times daily.   MYRBETRIQ 50 MG TB24 tablet Take 50 mg by mouth daily.    pantoprazole (PROTONIX) 40 MG tablet Take 1 tablet (40 mg total) by mouth 2 (two) times daily.   polyethylene glycol (MIRALAX / GLYCOLAX) 17 g packet Take 17 g by mouth daily.   No facility-administered encounter medications on file as of 10/31/2021.    Allergies (verified) Patient has no known allergies.   History: Past Medical History:  Diagnosis Date   Allergic rhinitis due to pollen 07/01/2009   Anemia, unspecified 09/07/2010   Arthritis    Benign paroxysmal positional vertigo  Candidiasis 01/01/2012   Cramp of limb 01/11/2011   Disorder of bone and cartilage, unspecified 11/10/2010   Dizziness and giddiness 11/10/2010   Insomnia, unspecified 09/07/2010   Knee pain, left anterior    Lumbago 07/01/2009   Lump or mass in breast 08/01/2012   Memory loss 01/22/2012   Myalgia and myositis, unspecified 05/17/2011   Myopathy, unspecified    Osteoarthrosis, unspecified whether generalized or localized, lower leg    Other abnormal glucose 03/23/2010   Other chest pain 08/28/2012    Other disorders of bone and cartilage(733.99) 09/19/2012   Overactive bladder    Rash    inner legs- ? med allergy- to see PCP if doesnt improve by end of week   Rash and nonspecific skin eruption 10/28/2014   Right bundle branch block    Seborrheic keratoses 05/17/2011   Spasm of muscle 01/11/2011   Spinal stenosis, lumbar region, without neurogenic claudication    Stress incontinence 07/08/2014   Unspecified constipation 12/18/2011   Unspecified tinnitus    Unspecified urinary incontinence    Urgency of urination    Urinary frequency 09/18/2012   Past Surgical History:  Procedure Laterality Date   ABDOMINAL HYSTERECTOMY     APPENDECTOMY  1956   BACK SURGERY     lumbar-- ? type  Dr Tonita Cong   BREAST LUMPECTOMY  1993   breast cyst   BREAST SURGERY     right lumpectomy   CLEFT LIP REPAIR     with repair palate   ESOPHAGOGASTRODUODENOSCOPY Left 03/10/2013   Procedure: ESOPHAGOGASTRODUODENOSCOPY (EGD);  Surgeon: Beryle Beams, MD;  Location: [facility];  Service: Endoscopy;  Laterality: Left;   EYE SURGERY     bilateral cataract extraction with IOL   TOTAL KNEE ARTHROPLASTY  12/13/2011   Procedure: TOTAL KNEE ARTHROPLASTY;  Surgeon: Johnn Hai, MD;  Location: [facility];  Service: Orthopedics;  Laterality: Left;   No family history on file. Social History   Socioeconomic History   Marital status: Divorced    Spouse name: Not on file   Number of children: Not on file   Years of education: Not on file   Highest education level: Not on file  Occupational History   Occupation: retired Fish farm manager  Tobacco Use   Smoking status: Never   Smokeless tobacco: Never  Substance and Sexual Activity   Alcohol use: No    Comment: socially   wine   Drug use: No   Sexual activity: Never    Birth control/protection: Post-menopausal  Other Topics Concern   Not on file  Social History Narrative   Lives at [facility] since 08/2007   Divorce    Never smoked   Alcohol -rare    Exercise none    Living Will   Social Determinants of Health   Financial Resource Strain: Not on file  Food Insecurity: Not on file  Transportation Needs: Not on file  Physical Activity: Not on file  Stress: Not on file  Social Connections: Not on file    Tobacco Counseling Counseling given: Not Answered   Clinical Intake:  Pre-visit preparation completed: Yes  Pain : 0-10 Pain Score: 2  Pain Type: Chronic pain Pain Location: Knee Pain Orientation: Right, Left Pain Descriptors / Indicators: Aching Pain Onset: More than a month ago Pain Frequency: Several days a week Pain Relieving Factors: Norco, rest Effect of Pain on Daily Activities: limited walking  Pain Relieving Factors: Norco, rest  BMI - recorded: 26 Nutritional Status: BMI  25 -29 Overweight Nutritional Risks: None Diabetes: No  How often do you need to have someone help you when you read instructions, pamphlets, or other written materials from your doctor or pharmacy?: 4 - Often What is the last grade level you completed in school?: high school  Diabetic?no  Interpreter Needed?: No  Information entered by :: Jigar Zielke Bretta Bang NP   Activities of Daily Living No flowsheet data found.  Patient Care Team: Melvina Pangelinan X, NP as PCP - General (Internal Medicine) [name] Kian Gamarra X, NP as Nurse Practitioner (Nurse Practitioner) [name], MD as Attending Physician (Urology) [name], MD as Consulting Physician (Gastroenterology)  Indicate any recent Medical Services you may have received from other than [facility] providers in the past year (date may be approximate).     Assessment:   This is a routine wellness examination for [place].  Hearing/Vision screen No results found.  Dietary issues and exercise activities discussed:     Goals Addressed             This Visit's Progress    Functional Decline Minimized       Evidence-based guidance:  Assess fall risk, including  balance and gait impairment, muscle weakness, diminished vision or hearing, environmental hazards, effects of medication and dehydration.  Assess and review gait speed, decreased muscle strength or impaired functional mobility; consider use of validated tool if available.  Prepare patient for rehabilitation services to develop an individualized activity and exercise program as indicated, such as progressive resistance strengthening, balance and activity tolerance training or home exercise program.  Prevent falls with environmental adjustment; encourage compliance with exercise program, management of incontinence and adequate vitamin D and calcium intake from food or supplements.  Promote gradual increase in intensity of activity and exercise as tolerated, such as duration, frequency, exercise repetition and sets and intensity.  Provide guidance and interventions aimed at fall prevention.  Review or assess presence of reduced muscle mass or results of dual-energy x-ray absorptiometry.   Notes:        Depression Screen PHQ 2/9 Scores 05/17/2017 09/15/2015 01/21/2014  PHQ - 2 Score 0 0 0    Fall Risk Fall Risk  03/27/2019 05/17/2017 09/15/2015 01/21/2014  Falls in the past year? (No Data) No No No  Comment Emmi Telephone Survey: data to providers prior to load - - -  Number falls in past yr: (No Data) - - -  Comment Emmi Telephone Survey Actual Response =  - - -    FALL RISK PREVENTION PERTAINING TO THE HOME:  Any stairs in or around the home? Yes  If so, are there any without handrails? No  Home free of loose throw rugs in walkways, pet beds, electrical cords, etc? Yes  Adequate lighting in your home to reduce risk of falls? Yes   ASSISTIVE DEVICES UTILIZED TO PREVENT FALLS:  Life alert? No  Use of a cane, walker or w/c? Yes  Grab bars in the bathroom? Yes  Shower chair or bench in shower? Yes  Elevated toilet seat or a handicapped toilet? Yes   TIMED UP AND GO:  Was the test  performed? No .   Gait slow and steady with assistive device  Cognitive Function: MMSE - Mini Mental State Exam 05/17/2017 09/15/2015 07/08/2014  Orientation to time '3 4 5  '$ Orientation to Place '5 5 5  '$ Registration '3 3 3  '$ Attention/ Calculation '5 5 5  '$ Recall 0 0 1  Language- name 2 objects  $'2 2 2  'Z$ Language- repeat '1 1 1  '$ Language- follow 3 step command '3 2 3  '$ Language- read & follow direction '1 1 1  '$ Write a sentence '1 1 1  '$ Copy design '1 1 1  '$ Total score '25 25 28        '$ Immunizations Immunization History  Administered Date(s) Administered   Influenza Whole 05/27/2012, 06/10/2013, 06/06/2018   Influenza, High Dose Seasonal PF 05/30/2019   Influenza-Unspecified 06/14/2014, 05/26/2015, 06/13/2016, 06/17/2017, 06/08/2020, 06/14/2021   Moderna Sars-Covid-2 Vaccination 08/29/2019, 09/26/2019, 07/05/2020, 01/24/2021   Pfizer Covid-19 Vaccine Bivalent Booster 32yr & up 05/16/2021   Pneumococcal Conjugate-13 06/24/2017   Pneumococcal Polysaccharide-23 10/03/2011   Td 10/03/2011   Zoster, Live 08/28/2007    TDAP status: Due, Education has been provided regarding the importance of this vaccine. Advised may receive this vaccine at local pharmacy or Health Dept. Aware to provide a copy of the vaccination record if obtained from local pharmacy or Health Dept. Verbalized acceptance and understanding.  Flu Vaccine status: Up to date  Pneumococcal vaccine status: Up to date  Covid-19 vaccine status: Completed vaccines  Qualifies for Shingles Vaccine? Yes   Zostavax completed No   Shingrix Completed?: No.    Education has been provided regarding the importance of this vaccine. Patient has been advised to call insurance company to determine out of pocket expense if they have not yet received this vaccine. Advised may also receive vaccine at local pharmacy or Health Dept. Verbalized acceptance and understanding.  Screening Tests Health Maintenance  Topic Date Due   Zoster Vaccines-  Shingrix (1 of 2) Never done   TETANUS/TDAP  10/02/2021   Pneumonia Vaccine 88 Years old  Completed   INFLUENZA VACCINE  Completed   DEXA SCAN  Completed   COVID-19 Vaccine  Completed   HPV VACCINES  Aged Out    Health Maintenance  Health Maintenance Due  Topic Date Due   Zoster Vaccines- Shingrix (1 of 2) Never done   TETANUS/TDAP  10/02/2021    Colorectal cancer screening: No longer required.   Mammogram status: No longer required due to aged out.  Bone Density status: Ordered DEXA. Pt provided with contact info and advised to call to schedule appt.  Lung Cancer Screening: (Low Dose CT Chest recommended if Age 86-80years, 30 pack-year currently smoking OR have quit w/in 15years.) does not qualify.    Additional Screening:  Hepatitis C Screening: does not qualify  Vision Screening: Recommended annual ophthalmology exams for early detection of glaucoma and other disorders of the eye. Is the patient up to date with their annual eye exam?  No  Who is the provider or what is the name of the office in which the patient attends annual eye exams? Will refer.  If pt is not established with a provider, would they like to be referred to a provider to establish care? No .   Dental Screening: Recommended annual dental exams for proper oral hygiene  Community Resource Referral / Chronic Care Management: CRR required this visit?  No   CCM required this visit?  No      Plan:    Tdap, Shingrix, DEXA, MMSE Eye Exam recommended.  I have personally reviewed and noted the following in the patients chart:   Medical and social history Use of alcohol, tobacco or illicit drugs  Current medications and supplements including opioid prescriptions.  Functional ability and status Nutritional status Physical activity Advanced directives List of other physicians Hospitalizations, surgeries, and ER visits  in previous 12 months Vitals Screenings to include cognitive, depression, and  falls Referrals and appointments  In addition, I have reviewed and discussed with patient certain preventive protocols, quality metrics, and best practice recommendations. A written personalized care plan for preventive services as well as general preventive health recommendations were provided to patient.     Aramis Weil X Jenisis Harmsen, NP   10/31/2021

## 2021-11-03 ENCOUNTER — Other Ambulatory Visit: Payer: Self-pay | Admitting: Orthopedic Surgery

## 2021-11-03 DIAGNOSIS — M159 Polyosteoarthritis, unspecified: Secondary | ICD-10-CM

## 2021-11-03 MED ORDER — HYDROCODONE-ACETAMINOPHEN 5-325 MG PO TABS: 1.0000 | ORAL_TABLET | Freq: Two times a day (BID) | ORAL | 0 refills | Status: DC

## 2021-11-04 ENCOUNTER — Encounter: Payer: Self-pay | Admitting: Nurse Practitioner

## 2021-12-07 ENCOUNTER — Non-Acute Institutional Stay (SKILLED_NURSING_FACILITY): Payer: Medicare Other | Admitting: Nurse Practitioner

## 2021-12-07 ENCOUNTER — Encounter: Payer: Self-pay | Admitting: Nurse Practitioner

## 2021-12-07 DIAGNOSIS — K5901 Slow transit constipation: Secondary | ICD-10-CM

## 2021-12-07 DIAGNOSIS — K269 Duodenal ulcer, unspecified as acute or chronic, without hemorrhage or perforation: Secondary | ICD-10-CM

## 2021-12-07 DIAGNOSIS — F0390 Unspecified dementia without behavioral disturbance: Secondary | ICD-10-CM

## 2021-12-07 DIAGNOSIS — R269 Unspecified abnormalities of gait and mobility: Secondary | ICD-10-CM | POA: Diagnosis not present

## 2021-12-07 DIAGNOSIS — N3281 Overactive bladder: Secondary | ICD-10-CM

## 2021-12-07 DIAGNOSIS — M159 Polyosteoarthritis, unspecified: Secondary | ICD-10-CM | POA: Diagnosis not present

## 2021-12-07 NOTE — Assessment & Plan Note (Signed)
uses walker for ambulation.   

## 2021-12-07 NOTE — Assessment & Plan Note (Signed)
GERD/PUD/gastric erosions, stable, on Pantoprazole,  Hgb 13.5 09/05/21 

## 2021-12-07 NOTE — Assessment & Plan Note (Signed)
s/p L TKR 2013,  on Norco, Tylenol 

## 2021-12-07 NOTE — Assessment & Plan Note (Signed)
stable, on MiraLax daily.  

## 2021-12-07 NOTE — Progress Notes (Signed)
?Location:  [facility] [name] ?Nursing Home Room Number: 51-A ?Place of Service:  SNF (31) ?Provider:  Romar Woodrick X, NP ? ? ?Appollonia Klee X, NP ? ?Patient Care Team: ?Lemoyne Scarpati X, NP as PCP - General (Internal Medicine) ?[name] ?Jovanni Rash X, NP as Nurse Practitioner (Nurse Practitioner) ?[name], MD as Attending Physician (Urology) ?[name], MD as Consulting Physician (Gastroenterology) ? ?Extended Emergency Contact Information ?Primary Emergency Contact: Manganaro,[name] ?Address: [address] ?         [place] [address] [place] ?Home Phone: [phone] ?Relation: Son ? ?Code Status:  DNR ?Goals of care: Advanced Directive information ? ?  12/07/2021  ? 10:48 AM  ?Advanced Directives  ?Does Patient Have a Medical Advance Directive? Yes  ?Type of Paramedic of [place];Living will;Out of facility DNR (pink MOST or yellow form)  ?Does patient want to make changes to medical advance directive? No - Patient declined  ?Copy of [place] in Chart? Yes - validated most recent copy scanned in chart (See row information)  ?Pre-existing out of facility DNR order (yellow form or pink MOST form) Yellow form placed in chart (order not valid for inpatient use)  ? ? ? ?Chief Complaint  ?Patient presents with  ? Routine  ? ? ?HPI:  ?Pt is a 88 y.o. female seen today for medical management of chronic diseases.   ?  ?  Dementia, on Memantine, Donepezil for memory. MMSE 20/30 09/10/19. TSH 1.11 09/05/21 ?            Hx of OA, s/p L TKR 2013,  on Norco, Tylenol ?            Gait abnormalit, uses walker for ambulation.        ?            GERD/PUD/gastric erosions, stable, on Pantoprazole,  Hgb 13.5 09/05/21 ?            Urinary frequency, managed on Myrbetriq  ?            Constipation, stable, on MiraLax daily.  ?  ? ?Past Medical History:  ?Diagnosis Date  ? Allergic rhinitis due to pollen 07/01/2009  ? Anemia, unspecified 09/07/2010  ?  Arthritis   ? Benign paroxysmal positional vertigo   ? Candidiasis 01/01/2012  ? Cramp of limb 01/11/2011  ? Disorder of bone and cartilage, unspecified 11/10/2010  ? Dizziness and giddiness 11/10/2010  ? Insomnia, unspecified 09/07/2010  ? Knee pain, left anterior   ? Lumbago 07/01/2009  ? Lump or mass in breast 08/01/2012  ? Memory loss 01/22/2012  ? Myalgia and myositis, unspecified 05/17/2011  ? Myopathy, unspecified   ? Osteoarthrosis, unspecified whether generalized or localized, lower leg   ? Other abnormal glucose 03/23/2010  ? Other chest pain 08/28/2012  ? Other disorders of bone and cartilage(733.99) 09/19/2012  ? Overactive bladder   ? Rash   ? inner legs- ? med allergy- to see PCP if doesnt improve by end of week  ? Rash and nonspecific skin eruption 10/28/2014  ? Right bundle branch block   ? Seborrheic keratoses 05/17/2011  ? Spasm of muscle 01/11/2011  ? Spinal stenosis, lumbar region, without neurogenic claudication   ? Stress incontinence 07/08/2014  ? Unspecified constipation 12/18/2011  ? Unspecified tinnitus   ? Unspecified urinary incontinence   ? Urgency of urination   ? Urinary frequency 09/18/2012  ? ?Past Surgical History:  ?Procedure Laterality  Date  ? ABDOMINAL HYSTERECTOMY    ? APPENDECTOMY  1956  ? BACK SURGERY    ? lumbar-- ? type  Dr Tonita Cong  ? BREAST LUMPECTOMY  1993  ? breast cyst  ? BREAST SURGERY    ? right lumpectomy  ? CLEFT LIP REPAIR    ? with repair palate  ? ESOPHAGOGASTRODUODENOSCOPY Left 03/10/2013  ? Procedure: ESOPHAGOGASTRODUODENOSCOPY (EGD);  Surgeon: Beryle Beams, MD;  Location: [facility];  Service: Endoscopy;  Laterality: Left;  ? EYE SURGERY    ? bilateral cataract extraction with IOL  ? TOTAL KNEE ARTHROPLASTY  12/13/2011  ? Procedure: TOTAL KNEE ARTHROPLASTY;  Surgeon: Johnn Hai, MD;  Location: [facility];  Service: Orthopedics;  Laterality: Left;  ? ? ?No Known Allergies ? ?Outpatient Encounter Medications as of 12/07/2021  ?Medication Sig  ? acetaminophen (TYLENOL) 500 MG tablet  Take 500 mg by mouth 2 (two) times daily.  ? donepezil (ARICEPT) 5 MG tablet Take 5 mg by mouth at bedtime.  ? HYDROcodone-acetaminophen (NORCO/VICODIN) 5-325 MG tablet Take 1 tablet by mouth 2 (two) times daily. For back pain  ? memantine (NAMENDA) 10 MG tablet Take 10 mg by mouth 2 (two) times daily.  ? MYRBETRIQ 50 MG TB24 tablet Take 50 mg by mouth daily.   ? pantoprazole (PROTONIX) 40 MG tablet Take 1 tablet (40 mg total) by mouth 2 (two) times daily.  ? polyethylene glycol (MIRALAX / GLYCOLAX) 17 g packet Take 17 g by mouth daily.  ? ?No facility-administered encounter medications on file as of 12/07/2021.  ? ? ?Review of Systems  ?Constitutional:  Negative for fatigue, fever and unexpected weight change.  ?HENT:  Positive for hearing loss. Negative for congestion, sinus pressure and trouble swallowing.   ?Eyes:  Negative for visual disturbance.  ?Respiratory:  Negative for cough and wheezing.   ?Cardiovascular:  Negative for leg swelling.  ?Gastrointestinal:  Negative for abdominal pain.  ?Genitourinary:  Positive for frequency. Negative for dysuria and urgency.  ?Musculoskeletal:  Positive for arthralgias, back pain and gait problem.  ?     Left knee pain  ?Skin:  Negative for color change.  ?Neurological:  Negative for speech difficulty, weakness and headaches.  ?     Dementia  ?Psychiatric/Behavioral:  Positive for confusion. Negative for behavioral problems and sleep disturbance.   ? ?Immunization History  ?Administered Date(s) Administered  ? Influenza Whole 05/27/2012, 06/10/2013, 06/06/2018  ? Influenza, High Dose Seasonal PF 05/30/2019  ? Influenza-Unspecified 06/14/2014, 05/26/2015, 06/13/2016, 06/17/2017, 06/08/2020, 06/14/2021  ? Moderna Sars-Covid-2 Vaccination 08/29/2019, 09/26/2019, 07/05/2020, 01/24/2021  ? Pension scheme manager 84yr & up 05/16/2021  ? Pneumococcal Conjugate-13 06/24/2017  ? Pneumococcal Polysaccharide-23 10/03/2011  ? Td 10/03/2011  ? Zoster, Live 08/28/2007   ? ?Pertinent  Health Maintenance Due  ?Topic Date Due  ? INFLUENZA VACCINE  03/27/2022  ? DEXA SCAN  Completed  ? ? ?  01/21/2014  ?  2:14 PM 09/15/2015  ?  3:29 PM 05/17/2017  ?  9:30 AM 03/27/2019  ?  5:06 PM  ?Fall Risk  ?Falls in the past year? No No No   ?Number of falls in past year - Comments    Emmi Telephone Survey Actual Response =   ? ?Functional Status Survey: ?  ? ?Vitals:  ? 12/07/21 1042  ?BP: 107/69  ?Pulse: 78  ?Resp: 17  ?Temp: (!) 97 ?F (36.1 ?C)  ?SpO2: 94%  ?Weight: 161 lb 1.6 oz (73.1 kg)  ?Height: '5\' 6"'$  (  1.676 m)  ? ?Body mass index is 26 kg/m?Marland Kitchen ?Physical Exam ?Vitals and nursing note reviewed.  ?Constitutional:   ?   Appearance: Normal appearance.  ?HENT:  ?   Head: Normocephalic and atraumatic.  ?   Mouth/Throat:  ?   Mouth: Mucous membranes are moist.  ?Eyes:  ?   Extraocular Movements: Extraocular movements intact.  ?   Conjunctiva/sclera:  ?   Right eye: Right conjunctiva is not injected.  ?   Left eye: Left conjunctiva is not injected.  ?   Pupils: Pupils are equal, round, and reactive to light.  ?Cardiovascular:  ?   Rate and Rhythm: Normal rate and regular rhythm.  ?   Heart sounds: No murmur heard. ?Pulmonary:  ?   Effort: Pulmonary effort is normal.  ?   Breath sounds: No rales.  ?Abdominal:  ?   General: Bowel sounds are normal.  ?   Palpations: Abdomen is soft.  ?   Tenderness: There is no abdominal tenderness.  ?Musculoskeletal:  ?   Cervical back: Normal range of motion and neck supple.  ?   Right lower leg: No edema.  ?   Left lower leg: No edema.  ?   Comments: Ambulates with walker. Left knee pain when walking.   ?Skin: ?   General: Skin is warm and dry.  ?Neurological:  ?   General: No focal deficit present.  ?   Mental Status: She is alert. Mental status is at baseline.  ?   Gait: Gait abnormal.  ?   Comments: Oriented to self, her room. Ambulates with walker with SBA  ?Psychiatric:     ?   Mood and Affect: Mood normal.     ?   Behavior: Behavior normal.  ? ? ?Labs  reviewed: ?Recent Labs  ?  12/14/20 ?0108 02/10/21 ?0000 09/05/21 ?0000  ?NA 143 137 141  ?K 4.3 4.0 4.1  ?CL 109* 106 107  ?CO2 24* 22 26*  ?BUN '18 16 15  '$ ?CREATININE 0.7 1.0 0.7  ?CALCIUM 9.3 9.3 9.1  ? ?Recent Labs

## 2021-12-07 NOTE — Assessment & Plan Note (Signed)
Urinary frequency, managed on Myrbetriq  

## 2021-12-07 NOTE — Assessment & Plan Note (Signed)
on Memantine, Donepezil for memory. MMSE 20/30 09/10/19. TSH 1.11 09/05/21 

## 2021-12-08 ENCOUNTER — Encounter: Payer: Self-pay | Admitting: Nurse Practitioner

## 2022-01-01 ENCOUNTER — Other Ambulatory Visit: Payer: Self-pay | Admitting: Adult Health

## 2022-01-01 DIAGNOSIS — M159 Polyosteoarthritis, unspecified: Secondary | ICD-10-CM

## 2022-01-01 MED ORDER — HYDROCODONE-ACETAMINOPHEN 5-325 MG PO TABS: 1.0000 | ORAL_TABLET | Freq: Two times a day (BID) | ORAL | 0 refills | Status: DC

## 2022-01-09 ENCOUNTER — Non-Acute Institutional Stay (SKILLED_NURSING_FACILITY): Payer: Medicare Other | Admitting: Internal Medicine

## 2022-01-09 ENCOUNTER — Encounter: Payer: Self-pay | Admitting: Internal Medicine

## 2022-01-09 DIAGNOSIS — F0390 Unspecified dementia without behavioral disturbance: Secondary | ICD-10-CM | POA: Diagnosis not present

## 2022-01-09 DIAGNOSIS — N3281 Overactive bladder: Secondary | ICD-10-CM | POA: Diagnosis not present

## 2022-01-09 DIAGNOSIS — K269 Duodenal ulcer, unspecified as acute or chronic, without hemorrhage or perforation: Secondary | ICD-10-CM | POA: Diagnosis not present

## 2022-01-09 DIAGNOSIS — M159 Polyosteoarthritis, unspecified: Secondary | ICD-10-CM | POA: Diagnosis not present

## 2022-01-09 NOTE — Progress Notes (Signed)
Location:   [facility] Room Number: 51 Place of Service:  SNF (31) Provider:  Veleta Miners MD   Mast, Man X, NP  Patient Care Team: Mast, Man X, NP as PCP - General (Internal Medicine) [name] Mast, Man X, NP as Nurse Practitioner (Nurse Practitioner) [name], MD as Attending Physician (Urology) [name], MD as Consulting Physician (Gastroenterology)  Extended Emergency Contact Information Primary Emergency Contact: Picariello,[name] Address: [place] [address] [name] of [place] Phone: [phone] Relation: Son  Code Status:  DNR Goals of care: Advanced Directive information    01/09/2022    4:13 PM  Advanced Directives  Does Patient Have a Medical Advance Directive? Yes  Type of Paramedic of [place];Living will;Out of facility DNR (pink MOST or yellow form)  Does patient want to make changes to medical advance directive? No - Patient declined  Copy of [place] in Chart? Yes - validated most recent copy scanned in chart (See row information)  Pre-existing out of facility DNR order (yellow form or pink MOST form) Yellow form placed in chart (order not valid for inpatient use)     Chief Complaint  Patient presents with   Medical Management of Chronic Issues   Quality Metric Gaps    Verified [facility] and [facility] patient is due for #2 shingrix    HPI:  Pt is a 88 y.o. female seen today for medical management of chronic diseases.    Lives in SNF She has h/o Lumbago, Stress incontinence, Vertigo, h/o Duodenal Ulcer and dementia and Arthritis      She is stable. No new Nursing issues. No Behavior issues Her weight is stable Wheelchair Dependent No Falls Wt Readings from Last 3 Encounters:  01/09/22 160 lb 12.8 oz (72.9 kg)  12/07/21 161 lb 1.6 oz (73.1 kg)  10/31/21 161 lb 1.6 oz (73.1 kg)    Past Medical History:  Diagnosis Date    Allergic rhinitis due to pollen 07/01/2009   Anemia, unspecified 09/07/2010   Arthritis    Benign paroxysmal positional vertigo    Candidiasis 01/01/2012   Cramp of limb 01/11/2011   Disorder of bone and cartilage, unspecified 11/10/2010   Dizziness and giddiness 11/10/2010   Insomnia, unspecified 09/07/2010   Knee pain, left anterior    Lumbago 07/01/2009   Lump or mass in breast 08/01/2012   Memory loss 01/22/2012   Myalgia and myositis, unspecified 05/17/2011   Myopathy, unspecified    Osteoarthrosis, unspecified whether generalized or localized, lower leg    Other abnormal glucose 03/23/2010   Other chest pain 08/28/2012   Other disorders of bone and cartilage(733.99) 09/19/2012   Overactive bladder    Rash    inner legs- ? med allergy- to see PCP if doesnt improve by end of week   Rash and nonspecific skin eruption 10/28/2014   Right bundle branch block    Seborrheic keratoses 05/17/2011   Spasm of muscle 01/11/2011   Spinal stenosis, lumbar region, without neurogenic claudication    Stress incontinence 07/08/2014   Unspecified constipation 12/18/2011   Unspecified tinnitus    Unspecified urinary incontinence    Urgency of urination    Urinary frequency 09/18/2012   Past Surgical History:  Procedure Laterality Date   ABDOMINAL HYSTERECTOMY     APPENDECTOMY  1956   BACK SURGERY     lumbar-- ? type  Dr Tonita Cong  BREAST LUMPECTOMY  1993   breast cyst   BREAST SURGERY     right lumpectomy   CLEFT LIP REPAIR     with repair palate   ESOPHAGOGASTRODUODENOSCOPY Left 03/10/2013   Procedure: ESOPHAGOGASTRODUODENOSCOPY (EGD);  Surgeon: Beryle Beams, MD;  Location: [facility];  Service: Endoscopy;  Laterality: Left;   EYE SURGERY     bilateral cataract extraction with IOL   TOTAL KNEE ARTHROPLASTY  12/13/2011   Procedure: TOTAL KNEE ARTHROPLASTY;  Surgeon: Johnn Hai, MD;  Location: [facility];  Service: Orthopedics;  Laterality: Left;    No Known Allergies  Allergies as of 01/09/2022    No Known Allergies      Medication List        Accurate as of Jan 09, 2022  4:13 PM. If you have any questions, ask your nurse or doctor.          acetaminophen 500 MG tablet Commonly known as: TYLENOL Take 500 mg by mouth 2 (two) times daily.   donepezil 5 MG tablet Commonly known as: ARICEPT Take 5 mg by mouth at bedtime.   HYDROcodone-acetaminophen 5-325 MG tablet Commonly known as: NORCO/VICODIN Take 1 tablet by mouth 2 (two) times daily. For back pain   memantine 10 MG tablet Commonly known as: NAMENDA Take 10 mg by mouth 2 (two) times daily.   Myrbetriq 50 MG Tb24 tablet Generic drug: mirabegron ER Take 50 mg by mouth daily.   pantoprazole 40 MG tablet Commonly known as: Protonix Take 1 tablet (40 mg total) by mouth 2 (two) times daily.   polyethylene glycol 17 g packet Commonly known as: MIRALAX / GLYCOLAX Take 17 g by mouth daily.        Review of Systems  Unable to perform ROS: Dementia   Immunization History  Administered Date(s) Administered   Influenza Whole 05/27/2012, 06/10/2013, 06/06/2018   Influenza, High Dose Seasonal PF 05/30/2019   Influenza-Unspecified 06/14/2014, 05/26/2015, 06/13/2016, 06/17/2017, 06/08/2020, 06/14/2021   Moderna Sars-Covid-2 Vaccination 08/29/2019, 09/26/2019, 07/05/2020, 01/24/2021   Pfizer Covid-19 Vaccine Bivalent Booster 2yr & up 05/16/2021   Pneumococcal Conjugate-13 06/24/2017   Pneumococcal Polysaccharide-23 10/03/2011   Td 10/03/2011   Tdap 10/31/2021   Zoster Recombinat (Shingrix) 10/31/2021   Zoster, Live 08/28/2007   Pertinent  Health Maintenance Due  Topic Date Due   INFLUENZA VACCINE  03/27/2022   DEXA SCAN  Completed      01/21/2014    2:14 PM 09/15/2015    3:29 PM 05/17/2017    9:30 AM 03/27/2019    5:06 PM  Fall Risk  Falls in the past year? No No No   Number of falls in past year - Comments    Emmi Telephone Survey Actual Response =    Functional Status Survey:    Vitals:    01/09/22 1608  BP: 121/68  Pulse: 78  Resp: 17  Temp: (!) 97.5 F (36.4 C)  SpO2: 94%  Weight: 160 lb 12.8 oz (72.9 kg)  Height: '5\' 6"'$  (1.676 m)   Body mass index is 25.95 kg/m. Physical Exam Vitals reviewed.  Constitutional:      Appearance: Normal appearance.  HENT:     Head: Normocephalic.     Nose: Nose normal.     Mouth/Throat:     Mouth: Mucous membranes are moist.     Pharynx: Oropharynx is clear.  Eyes:     Pupils: Pupils are equal, round, and reactive to light.  Cardiovascular:     Rate and  Rhythm: Normal rate and regular rhythm.     Pulses: Normal pulses.     Heart sounds: Normal heart sounds. No murmur heard. Pulmonary:     Effort: Pulmonary effort is normal.     Breath sounds: Normal breath sounds.  Abdominal:     General: Abdomen is flat. Bowel sounds are normal.     Palpations: Abdomen is soft.  Musculoskeletal:        General: No swelling.     Cervical back: Neck supple.  Skin:    General: Skin is warm.  Neurological:     General: No focal deficit present.     Mental Status: She is alert.  Psychiatric:        Mood and Affect: Mood normal.        Thought Content: Thought content normal.    Labs reviewed: Recent Labs    02/10/21 0000 09/05/21 0000  NA 137 141  K 4.0 4.1  CL 106 107  CO2 22 26*  BUN 16 15  CREATININE 1.0 0.7  CALCIUM 9.3 9.1   Recent Labs    02/10/21 0000 09/05/21 0000  AST 17 11*  ALT 16 6*  ALKPHOS 142* 107  ALBUMIN 3.9 3.3*   Recent Labs    02/10/21 0000 09/05/21 0000  WBC 7.9 6.9  NEUTROABS 6,707.00  --   HGB 15.6 13.5  HCT 47* 40  PLT 319 290   Lab Results  Component Value Date   TSH 1.11 09/05/2021   Lab Results  Component Value Date   HGBA1C 5.6 04/24/2016   Lab Results  Component Value Date   CHOL 189 09/06/2015   HDL 65 09/06/2015   LDLCALC 99 09/06/2015   TRIG 126 09/06/2015    Significant Diagnostic Results in last 30 days:  No results found.  Assessment/Plan 1. Primary  osteoarthritis involving multiple joints Tylenol and Norco   2. Duodenal ulcer without hemorrhage or perforation On Protonix  3. Overactive bladder Continue Myrbetriq  4 Dementia On Aricept and Namenda  Family/ staff Communication:   Labs/tests ordered:

## 2022-01-12 DIAGNOSIS — Z23 Encounter for immunization: Secondary | ICD-10-CM | POA: Diagnosis not present

## 2022-01-31 ENCOUNTER — Non-Acute Institutional Stay (SKILLED_NURSING_FACILITY): Payer: Medicare Other | Admitting: Adult Health

## 2022-01-31 ENCOUNTER — Encounter: Payer: Self-pay | Admitting: Adult Health

## 2022-01-31 DIAGNOSIS — N3281 Overactive bladder: Secondary | ICD-10-CM

## 2022-01-31 DIAGNOSIS — F0390 Unspecified dementia without behavioral disturbance: Secondary | ICD-10-CM | POA: Diagnosis not present

## 2022-01-31 DIAGNOSIS — K5901 Slow transit constipation: Secondary | ICD-10-CM | POA: Diagnosis not present

## 2022-01-31 DIAGNOSIS — K269 Duodenal ulcer, unspecified as acute or chronic, without hemorrhage or perforation: Secondary | ICD-10-CM | POA: Diagnosis not present

## 2022-01-31 NOTE — Progress Notes (Signed)
Location:  [facility] Room Number: 49 A Place of Service:  SNF (31) Provider:  Durenda Age, DNP, FNP-BC  Patient Care Team: Mast, Man X, NP as PCP - General (Internal Medicine) [name] Mast, Man X, NP as Nurse Practitioner (Nurse Practitioner) [name], MD as Attending Physician (Urology) [name], MD as Consulting Physician (Gastroenterology)  Extended Emergency Contact Information Primary Emergency Contact: Cho,[name] Address: [place] [address] [name] of [place] Phone: [phone] Relation: Son  Code Status:  DNR  Goals of care: Advanced Directive information    01/09/2022    4:13 PM  Advanced Directives  Does Patient Have a Medical Advance Directive? Yes  Type of Paramedic of [place];Living will;Out of facility DNR (pink MOST or yellow form)  Does patient want to make changes to medical advance directive? No - Patient declined  Copy of [place] in Chart? Yes - validated most recent copy scanned in chart (See row information)  Pre-existing out of facility DNR order (yellow form or pink MOST form) Yellow form placed in chart (order not valid for inpatient use)     Chief Complaint  Patient presents with   Medical Management of Chronic Issues    Routine Visit    HPI:  Pt is a 88 y.o. female seen today for medical management of chronic diseases. She is a long-term care resident of [facility].  Overactive bladder  - takes Myrbetriq 24-hour 50 mg 1 tab daily  Duodenal ulcer without hemorrhage or perforation -   denies abdominal pain and episodes of bloody stool, takes Protonix 40 mg twice a day  Slow transit constipation -   takes MiraLAX 17 g daily  Dementia without behavioral disturbance (HCC) -  BIMS score 3/15, takes Aricept 5 mg at bedtime and Namenda 10 mg twice a day    Past Medical History:   Diagnosis Date   Allergic rhinitis due to pollen 07/01/2009   Anemia, unspecified 09/07/2010   Arthritis    Benign paroxysmal positional vertigo    Candidiasis 01/01/2012   Cramp of limb 01/11/2011   Disorder of bone and cartilage, unspecified 11/10/2010   Dizziness and giddiness 11/10/2010   Insomnia, unspecified 09/07/2010   Knee pain, left anterior    Lumbago 07/01/2009   Lump or mass in breast 08/01/2012   Memory loss 01/22/2012   Myalgia and myositis, unspecified 05/17/2011   Myopathy, unspecified    Osteoarthrosis, unspecified whether generalized or localized, lower leg    Other abnormal glucose 03/23/2010   Other chest pain 08/28/2012   Other disorders of bone and cartilage(733.99) 09/19/2012   Overactive bladder    Rash    inner legs- ? med allergy- to see PCP if doesnt improve by end of week   Rash and nonspecific skin eruption 10/28/2014   Right bundle branch block    Seborrheic keratoses 05/17/2011   Spasm of muscle 01/11/2011   Spinal stenosis, lumbar region, without neurogenic claudication    Stress incontinence 07/08/2014   Unspecified constipation 12/18/2011   Unspecified tinnitus    Unspecified urinary incontinence    Urgency of urination    Urinary frequency 09/18/2012   Past Surgical History:  Procedure Laterality Date   ABDOMINAL HYSTERECTOMY     APPENDECTOMY  1956   BACK SURGERY     lumbar-- ? type  Dr Tonita Cong   BREAST LUMPECTOMY  1993   breast cyst   BREAST SURGERY     right lumpectomy   CLEFT LIP REPAIR     with repair palate   ESOPHAGOGASTRODUODENOSCOPY Left 03/10/2013   Procedure: ESOPHAGOGASTRODUODENOSCOPY (EGD);  Surgeon: Beryle Beams, MD;  Location: [facility];  Service: Endoscopy;  Laterality: Left;   EYE SURGERY     bilateral cataract extraction with IOL   TOTAL KNEE ARTHROPLASTY  12/13/2011   Procedure: TOTAL KNEE ARTHROPLASTY;  Surgeon: Johnn Hai, MD;  Location: [facility];  Service: Orthopedics;  Laterality: Left;    No Known  Allergies  Outpatient Encounter Medications as of 01/31/2022  Medication Sig   acetaminophen (TYLENOL) 500 MG tablet Take 500 mg by mouth 2 (two) times daily.   donepezil (ARICEPT) 5 MG tablet Take 5 mg by mouth at bedtime.   HYDROcodone-acetaminophen (NORCO/VICODIN) 5-325 MG tablet Take 1 tablet by mouth 2 (two) times daily. For back pain   memantine (NAMENDA) 10 MG tablet Take 10 mg by mouth 2 (two) times daily.   MYRBETRIQ 50 MG TB24 tablet Take 50 mg by mouth daily.    pantoprazole (PROTONIX) 40 MG tablet Take 1 tablet (40 mg total) by mouth 2 (two) times daily.   polyethylene glycol (MIRALAX / GLYCOLAX) 17 g packet Take 17 g by mouth daily.   No facility-administered encounter medications on file as of 01/31/2022.    Review of Systems  Constitutional:  Negative for appetite change, chills, fatigue and fever.  HENT:  Negative for congestion, hearing loss, rhinorrhea and sore throat.   Eyes: Negative.   Respiratory:  Negative for cough, shortness of breath and wheezing.   Cardiovascular:  Negative for chest pain, palpitations and leg swelling.  Gastrointestinal:  Negative for abdominal pain, constipation, diarrhea, nausea and vomiting.  Genitourinary:  Negative for dysuria.  Musculoskeletal:  Negative for arthralgias, back pain and myalgias.  Skin:  Negative for color change, rash and wound.  Neurological:  Negative for dizziness, weakness and headaches.  Psychiatric/Behavioral:  Negative for behavioral problems. The patient is not nervous/anxious.       Immunization History  Administered Date(s) Administered   Influenza Whole 05/27/2012, 06/10/2013, 06/06/2018   Influenza, High Dose Seasonal PF 05/30/2019   Influenza-Unspecified 06/14/2014, 05/26/2015, 06/13/2016, 06/17/2017, 06/08/2020, 06/14/2021   Moderna Sars-Covid-2 Vaccination 08/29/2019, 09/26/2019, 07/05/2020, 01/24/2021   Pfizer Covid-19 Vaccine Bivalent Booster 43yr & up 05/16/2021   Pneumococcal Conjugate-13  06/24/2017   Pneumococcal Polysaccharide-23 10/03/2011   Td 10/03/2011   Tdap 10/31/2021   Zoster Recombinat (Shingrix) 10/31/2021   Zoster, Live 08/28/2007   Pertinent  Health Maintenance Due  Topic Date Due   INFLUENZA VACCINE  03/27/2022   DEXA SCAN  Completed      01/21/2014    2:14 PM 09/15/2015    3:29 PM 05/17/2017    9:30 AM 03/27/2019    5:06 PM  Fall Risk  Falls in the past year? No No No   Number of falls in past year - Comments    Emmi Telephone Survey Actual Response =      Vitals:   01/31/22 1723  BP: 130/72  Pulse: 82  Resp: 16  Temp: 98 F (36.7 C)  SpO2: 94%  Weight: 161 lb 12.8 oz (73.4 kg)  Height: '5\' 2"'$  (1.575 m)   Body mass index is 29.59 kg/m.  Physical Exam Constitutional:      Appearance: Normal appearance.  HENT:     Head: Normocephalic and atraumatic.     Nose: Nose  normal.     Mouth/Throat:     Mouth: Mucous membranes are moist.  Eyes:     Conjunctiva/sclera: Conjunctivae normal.  Cardiovascular:     Rate and Rhythm: Normal rate and regular rhythm.  Pulmonary:     Effort: Pulmonary effort is normal.     Breath sounds: Normal breath sounds.  Abdominal:     General: Bowel sounds are normal.     Palpations: Abdomen is soft.  Musculoskeletal:        General: Normal range of motion.     Cervical back: Normal range of motion.  Skin:    General: Skin is warm and dry.  Neurological:     Mental Status: She is alert. Mental status is at baseline. She is disoriented.     Comments: Alert to self, disoriented to time and place.  Psychiatric:        Mood and Affect: Mood normal.        Behavior: Behavior normal.        Thought Content: Thought content normal.        Judgment: Judgment normal.        Labs reviewed: Recent Labs    02/10/21 0000 09/05/21 0000  NA 137 141  K 4.0 4.1  CL 106 107  CO2 22 26*  BUN 16 15  CREATININE 1.0 0.7  CALCIUM 9.3 9.1   Recent Labs    02/10/21 0000 09/05/21 0000  AST 17 11*  ALT 16 6*   ALKPHOS 142* 107  ALBUMIN 3.9 3.3*   Recent Labs    02/10/21 0000 09/05/21 0000  WBC 7.9 6.9  NEUTROABS 6,707.00  --   HGB 15.6 13.5  HCT 47* 40  PLT 319 290   Lab Results  Component Value Date   TSH 1.11 09/05/2021   Lab Results  Component Value Date   HGBA1C 5.6 04/24/2016   Lab Results  Component Value Date   CHOL 189 09/06/2015   HDL 65 09/06/2015   LDLCALC 99 09/06/2015   TRIG 126 09/06/2015    Significant Diagnostic Results in last 30 days:  No results found.  Assessment/Plan  1. Overactive bladder -   Stable, continue Myrbetriq  2. Duodenal ulcer without hemorrhage or perforation -   Denies abdominal pain or bloody stool -   Continue Protonix  3. Slow transit constipation -   Stable, continue MiraLAX  4. Dementia without behavioral disturbance ([place]) -   Has severe cognitive impairment, continue Aricept 10 Namenda -   Continue supportive care    Family/ staff Communication: Discussed plan of care with resident and charge nurse  Labs/tests ordered:   None    Durenda Age, DNP, MSN, FNP-BC [facility] [phone] (Monday-Friday 8:00 a.m. - 5:00 p.m.) [phone] (after hours)

## 2022-02-01 ENCOUNTER — Other Ambulatory Visit: Payer: Self-pay | Admitting: Adult Health

## 2022-02-01 DIAGNOSIS — M159 Polyosteoarthritis, unspecified: Secondary | ICD-10-CM

## 2022-02-01 MED ORDER — HYDROCODONE-ACETAMINOPHEN 5-325 MG PO TABS: 1.0000 | ORAL_TABLET | Freq: Two times a day (BID) | ORAL | 0 refills | Status: DC

## 2022-02-16 DIAGNOSIS — K219 Gastro-esophageal reflux disease without esophagitis: Secondary | ICD-10-CM | POA: Diagnosis not present

## 2022-02-16 DIAGNOSIS — F0390 Unspecified dementia without behavioral disturbance: Secondary | ICD-10-CM | POA: Diagnosis not present

## 2022-02-16 DIAGNOSIS — R41841 Cognitive communication deficit: Secondary | ICD-10-CM | POA: Diagnosis not present

## 2022-02-16 DIAGNOSIS — R1312 Dysphagia, oropharyngeal phase: Secondary | ICD-10-CM | POA: Diagnosis not present

## 2022-02-19 DIAGNOSIS — F0390 Unspecified dementia without behavioral disturbance: Secondary | ICD-10-CM | POA: Diagnosis not present

## 2022-02-19 DIAGNOSIS — K219 Gastro-esophageal reflux disease without esophagitis: Secondary | ICD-10-CM | POA: Diagnosis not present

## 2022-02-19 DIAGNOSIS — R1312 Dysphagia, oropharyngeal phase: Secondary | ICD-10-CM | POA: Diagnosis not present

## 2022-02-19 DIAGNOSIS — R41841 Cognitive communication deficit: Secondary | ICD-10-CM | POA: Diagnosis not present

## 2022-02-20 DIAGNOSIS — R1312 Dysphagia, oropharyngeal phase: Secondary | ICD-10-CM | POA: Diagnosis not present

## 2022-02-20 DIAGNOSIS — R41841 Cognitive communication deficit: Secondary | ICD-10-CM | POA: Diagnosis not present

## 2022-02-20 DIAGNOSIS — F0390 Unspecified dementia without behavioral disturbance: Secondary | ICD-10-CM | POA: Diagnosis not present

## 2022-02-20 DIAGNOSIS — K219 Gastro-esophageal reflux disease without esophagitis: Secondary | ICD-10-CM | POA: Diagnosis not present

## 2022-02-21 DIAGNOSIS — F0390 Unspecified dementia without behavioral disturbance: Secondary | ICD-10-CM | POA: Diagnosis not present

## 2022-02-21 DIAGNOSIS — R41841 Cognitive communication deficit: Secondary | ICD-10-CM | POA: Diagnosis not present

## 2022-02-21 DIAGNOSIS — R1312 Dysphagia, oropharyngeal phase: Secondary | ICD-10-CM | POA: Diagnosis not present

## 2022-02-21 DIAGNOSIS — K219 Gastro-esophageal reflux disease without esophagitis: Secondary | ICD-10-CM | POA: Diagnosis not present

## 2022-02-22 DIAGNOSIS — R1312 Dysphagia, oropharyngeal phase: Secondary | ICD-10-CM | POA: Diagnosis not present

## 2022-02-22 DIAGNOSIS — K219 Gastro-esophageal reflux disease without esophagitis: Secondary | ICD-10-CM | POA: Diagnosis not present

## 2022-02-22 DIAGNOSIS — R41841 Cognitive communication deficit: Secondary | ICD-10-CM | POA: Diagnosis not present

## 2022-02-22 DIAGNOSIS — F0390 Unspecified dementia without behavioral disturbance: Secondary | ICD-10-CM | POA: Diagnosis not present

## 2022-02-23 DIAGNOSIS — R1312 Dysphagia, oropharyngeal phase: Secondary | ICD-10-CM | POA: Diagnosis not present

## 2022-02-23 DIAGNOSIS — K219 Gastro-esophageal reflux disease without esophagitis: Secondary | ICD-10-CM | POA: Diagnosis not present

## 2022-02-23 DIAGNOSIS — F0390 Unspecified dementia without behavioral disturbance: Secondary | ICD-10-CM | POA: Diagnosis not present

## 2022-02-23 DIAGNOSIS — R41841 Cognitive communication deficit: Secondary | ICD-10-CM | POA: Diagnosis not present

## 2022-02-26 DIAGNOSIS — K219 Gastro-esophageal reflux disease without esophagitis: Secondary | ICD-10-CM | POA: Diagnosis not present

## 2022-02-26 DIAGNOSIS — R1312 Dysphagia, oropharyngeal phase: Secondary | ICD-10-CM | POA: Diagnosis not present

## 2022-02-26 DIAGNOSIS — R41841 Cognitive communication deficit: Secondary | ICD-10-CM | POA: Diagnosis not present

## 2022-02-26 DIAGNOSIS — F0390 Unspecified dementia without behavioral disturbance: Secondary | ICD-10-CM | POA: Diagnosis not present

## 2022-02-27 DIAGNOSIS — K219 Gastro-esophageal reflux disease without esophagitis: Secondary | ICD-10-CM | POA: Diagnosis not present

## 2022-02-27 DIAGNOSIS — F0390 Unspecified dementia without behavioral disturbance: Secondary | ICD-10-CM | POA: Diagnosis not present

## 2022-02-27 DIAGNOSIS — R1312 Dysphagia, oropharyngeal phase: Secondary | ICD-10-CM | POA: Diagnosis not present

## 2022-02-27 DIAGNOSIS — R41841 Cognitive communication deficit: Secondary | ICD-10-CM | POA: Diagnosis not present

## 2022-03-01 DIAGNOSIS — K219 Gastro-esophageal reflux disease without esophagitis: Secondary | ICD-10-CM | POA: Diagnosis not present

## 2022-03-01 DIAGNOSIS — R1312 Dysphagia, oropharyngeal phase: Secondary | ICD-10-CM | POA: Diagnosis not present

## 2022-03-01 DIAGNOSIS — R41841 Cognitive communication deficit: Secondary | ICD-10-CM | POA: Diagnosis not present

## 2022-03-01 DIAGNOSIS — F0390 Unspecified dementia without behavioral disturbance: Secondary | ICD-10-CM | POA: Diagnosis not present

## 2022-03-05 ENCOUNTER — Other Ambulatory Visit: Payer: Self-pay | Admitting: Adult Health

## 2022-03-05 DIAGNOSIS — K219 Gastro-esophageal reflux disease without esophagitis: Secondary | ICD-10-CM | POA: Diagnosis not present

## 2022-03-05 DIAGNOSIS — R41841 Cognitive communication deficit: Secondary | ICD-10-CM | POA: Diagnosis not present

## 2022-03-05 DIAGNOSIS — M159 Polyosteoarthritis, unspecified: Secondary | ICD-10-CM

## 2022-03-05 DIAGNOSIS — F0390 Unspecified dementia without behavioral disturbance: Secondary | ICD-10-CM | POA: Diagnosis not present

## 2022-03-05 DIAGNOSIS — R1312 Dysphagia, oropharyngeal phase: Secondary | ICD-10-CM | POA: Diagnosis not present

## 2022-03-05 MED ORDER — HYDROCODONE-ACETAMINOPHEN 5-325 MG PO TABS: 1.0000 | ORAL_TABLET | Freq: Two times a day (BID) | ORAL | 0 refills | Status: DC

## 2022-03-06 DIAGNOSIS — R41841 Cognitive communication deficit: Secondary | ICD-10-CM | POA: Diagnosis not present

## 2022-03-06 DIAGNOSIS — F0390 Unspecified dementia without behavioral disturbance: Secondary | ICD-10-CM | POA: Diagnosis not present

## 2022-03-06 DIAGNOSIS — K219 Gastro-esophageal reflux disease without esophagitis: Secondary | ICD-10-CM | POA: Diagnosis not present

## 2022-03-06 DIAGNOSIS — R1312 Dysphagia, oropharyngeal phase: Secondary | ICD-10-CM | POA: Diagnosis not present

## 2022-03-07 ENCOUNTER — Non-Acute Institutional Stay (SKILLED_NURSING_FACILITY): Payer: Medicare Other | Admitting: Adult Health

## 2022-03-07 ENCOUNTER — Encounter: Payer: Self-pay | Admitting: Adult Health

## 2022-03-07 DIAGNOSIS — M159 Polyosteoarthritis, unspecified: Secondary | ICD-10-CM

## 2022-03-07 DIAGNOSIS — K5901 Slow transit constipation: Secondary | ICD-10-CM | POA: Diagnosis not present

## 2022-03-07 DIAGNOSIS — K269 Duodenal ulcer, unspecified as acute or chronic, without hemorrhage or perforation: Secondary | ICD-10-CM

## 2022-03-07 DIAGNOSIS — R1312 Dysphagia, oropharyngeal phase: Secondary | ICD-10-CM | POA: Diagnosis not present

## 2022-03-07 DIAGNOSIS — F0390 Unspecified dementia without behavioral disturbance: Secondary | ICD-10-CM

## 2022-03-07 DIAGNOSIS — N3281 Overactive bladder: Secondary | ICD-10-CM

## 2022-03-07 DIAGNOSIS — R41841 Cognitive communication deficit: Secondary | ICD-10-CM | POA: Diagnosis not present

## 2022-03-07 DIAGNOSIS — K219 Gastro-esophageal reflux disease without esophagitis: Secondary | ICD-10-CM | POA: Diagnosis not present

## 2022-03-07 NOTE — Progress Notes (Signed)
Location:  [facility] Room Number: NO/51/A Place of Service:  SNF (31) Provider:  Durenda Age, DNP, FNP-BC  Patient Care Team: Mast, Man X, NP as PCP - General (Internal Medicine) [name] Mast, Man X, NP as Nurse Practitioner (Nurse Practitioner) [name], MD as Attending Physician (Urology) [name], MD as Consulting Physician (Gastroenterology)  Extended Emergency Contact Information Primary Emergency Contact: Kazmi,[name] Address: [place] [address] [name] of [place] Phone: [phone] Relation: Son  Code Status:  DNR  Goals of care: Advanced Directive information    03/07/2022    1:56 PM  Advanced Directives  Does Patient Have a Medical Advance Directive? Yes  Type of Paramedic of [place];Living will;Out of facility DNR (pink MOST or yellow form)  Does patient want to make changes to medical advance directive? No - Patient declined  Copy of [place] in Chart? Yes - validated most recent copy scanned in chart (See row information)  Pre-existing out of facility DNR order (yellow form or pink MOST form) Yellow form placed in chart (order not valid for inpatient use)     Chief Complaint  Patient presents with   Medical Management of Chronic Issues    Routine Visit     HPI:  Pt is a 88 y.o. female seen today for medical management of chronic diseases. She is a long-term care resident of [facility].  Overactive bladder  -  takes Myrbetriq ER 50 mg daily  Duodenal ulcer without hemorrhage or perforation -   denies abdominal pain, takes Protonix 40 mg twice a day  Primary osteoarthritis involving multiple joints  -denies pain, takes Norco 5-325 mg twice daily  Slow transit constipation  -   takes MiraLAX 17 g daily  Dementia without behavioral disturbance (HCC)  -BIMS score 0/15, ranging in severe  cognitive impairment, takes Aricept 5 mg at bedtime and Namenda 10 mg twice a day    Past Medical History:  Diagnosis Date   Allergic rhinitis due to pollen 07/01/2009   Anemia, unspecified 09/07/2010   Arthritis    Benign paroxysmal positional vertigo    Candidiasis 01/01/2012   Cramp of limb 01/11/2011   Disorder of bone and cartilage, unspecified 11/10/2010   Dizziness and giddiness 11/10/2010   Insomnia, unspecified 09/07/2010   Knee pain, left anterior    Lumbago 07/01/2009   Lump or mass in breast 08/01/2012   Memory loss 01/22/2012   Myalgia and myositis, unspecified 05/17/2011   Myopathy, unspecified    Osteoarthrosis, unspecified whether generalized or localized, lower leg    Other abnormal glucose 03/23/2010   Other chest pain 08/28/2012   Other disorders of bone and cartilage(733.99) 09/19/2012   Overactive bladder    Rash    inner legs- ? med allergy- to see PCP if doesnt improve by end of week   Rash and nonspecific skin eruption 10/28/2014   Right bundle branch block    Seborrheic keratoses 05/17/2011   Spasm of muscle 01/11/2011   Spinal stenosis, lumbar region, without neurogenic claudication    Stress incontinence 07/08/2014   Unspecified constipation 12/18/2011   Unspecified tinnitus    Unspecified urinary incontinence    Urgency of urination    Urinary frequency 09/18/2012   Past Surgical History:  Procedure Laterality Date   ABDOMINAL HYSTERECTOMY     APPENDECTOMY  1956   BACK SURGERY  lumbar-- ? type  Dr Tonita Cong   BREAST LUMPECTOMY  1993   breast cyst   BREAST SURGERY     right lumpectomy   CLEFT LIP REPAIR     with repair palate   ESOPHAGOGASTRODUODENOSCOPY Left 03/10/2013   Procedure: ESOPHAGOGASTRODUODENOSCOPY (EGD);  Surgeon: Beryle Beams, MD;  Location: [facility];  Service: Endoscopy;  Laterality: Left;   EYE SURGERY     bilateral cataract extraction with IOL   TOTAL KNEE ARTHROPLASTY  12/13/2011   Procedure: TOTAL KNEE ARTHROPLASTY;  Surgeon: Johnn Hai, MD;  Location: [facility];  Service: Orthopedics;  Laterality: Left;    No Known Allergies  Outpatient Encounter Medications as of 03/07/2022  Medication Sig   acetaminophen (TYLENOL) 500 MG tablet Take 500 mg by mouth 2 (two) times daily.   donepezil (ARICEPT) 5 MG tablet Take 5 mg by mouth at bedtime.   HYDROcodone-acetaminophen (NORCO/VICODIN) 5-325 MG tablet Take 1 tablet by mouth 2 (two) times daily. For back pain   memantine (NAMENDA) 10 MG tablet Take 10 mg by mouth 2 (two) times daily.   MYRBETRIQ 50 MG TB24 tablet Take 50 mg by mouth daily.    pantoprazole (PROTONIX) 40 MG tablet Take 1 tablet (40 mg total) by mouth 2 (two) times daily.   polyethylene glycol (MIRALAX / GLYCOLAX) 17 g packet Take 17 g by mouth daily.   No facility-administered encounter medications on file as of 03/07/2022.    Review of Systems Unable to obtain due to dementia.    Immunization History  Administered Date(s) Administered   Influenza Whole 05/27/2012, 06/10/2013, 06/06/2018   Influenza, High Dose Seasonal PF 05/30/2019   Influenza-Unspecified 06/14/2014, 05/26/2015, 06/13/2016, 06/17/2017, 06/08/2020, 06/14/2021   Moderna Sars-Covid-2 Vaccination 08/29/2019, 09/26/2019, 07/05/2020, 01/24/2021   Pfizer Covid-19 Vaccine Bivalent Booster 56yr & up 05/16/2021   Pneumococcal Conjugate-13 06/24/2017   Pneumococcal Polysaccharide-23 10/03/2011   Td 10/03/2011   Tdap 10/31/2021   Zoster Recombinat (Shingrix) 10/31/2021   Zoster, Live 08/28/2007   Pertinent  Health Maintenance Due  Topic Date Due   INFLUENZA VACCINE  03/27/2022   DEXA SCAN  Completed      01/21/2014    2:14 PM 09/15/2015    3:29 PM 05/17/2017    9:30 AM 03/27/2019    5:06 PM  Fall Risk  Falls in the past year? No No No   Number of falls in past year - Comments    Emmi Telephone Survey Actual Response =      Vitals:   03/07/22 1351  BP: 122/68  Pulse: 77  Resp: 18  Temp: 98.4 F (36.9 C)  SpO2: 98%  Weight: 161  lb 1.6 oz (73.1 kg)  Height: '5\' 6"'$  (1.676 m)   Body mass index is 26 kg/m.  Physical Exam Constitutional:      General: She is not in acute distress.    Appearance: Normal appearance.  HENT:     Head: Normocephalic and atraumatic.     Nose: Nose normal.     Mouth/Throat:     Mouth: Mucous membranes are moist.  Eyes:     Conjunctiva/sclera: Conjunctivae normal.  Cardiovascular:     Rate and Rhythm: Normal rate and regular rhythm.  Pulmonary:     Effort: Pulmonary effort is normal.     Breath sounds: Normal breath sounds.  Abdominal:     General: Bowel sounds are normal.     Palpations: Abdomen is soft.  Musculoskeletal:  General: Normal range of motion.     Cervical back: Normal range of motion.  Skin:    General: Skin is warm and dry.  Neurological:     General: No focal deficit present.     Mental Status: She is alert. She is disoriented.     Comments: Alert to self, disoriented to time and place.  Psychiatric:        Mood and Affect: Mood normal.        Behavior: Behavior normal.      Labs reviewed: Recent Labs    09/05/21 0000  NA 141  K 4.1  CL 107  CO2 26*  BUN 15  CREATININE 0.7  CALCIUM 9.1   Recent Labs    09/05/21 0000  AST 11*  ALT 6*  ALKPHOS 107  ALBUMIN 3.3*   Recent Labs    09/05/21 0000  WBC 6.9  HGB 13.5  HCT 40  PLT 290   Lab Results  Component Value Date   TSH 1.11 09/05/2021   Lab Results  Component Value Date   HGBA1C 5.6 04/24/2016   Lab Results  Component Value Date   CHOL 189 09/06/2015   HDL 65 09/06/2015   LDLCALC 99 09/06/2015   TRIG 126 09/06/2015    Significant Diagnostic Results in last 30 days:  No results found.  Assessment/Plan  1. Overactive bladder -   stable, continue Myrbetriq  2. Duodenal ulcer without hemorrhage or perforation -  no abdominal pain. Continue Protonix  3. Primary osteoarthritis involving multiple joints -  stable, continue Norco  4. Slow transit constipation -   has regular BMs, continue Miralax  5. Dementia without behavioral disturbance (HCC) -   has severe cognitive impairment per BIMS score of 0/15, continue Aricept and Namenda -  continue supportive care   Family/ staff Communication: Discussed plan of care with charge nurse  Labs/tests ordered:   None    Durenda Age, DNP, MSN, FNP-BC [facility] [phone] (Monday-Friday 8:00 a.m. - 5:00 p.m.) [phone] (after hours)

## 2022-03-08 DIAGNOSIS — K219 Gastro-esophageal reflux disease without esophagitis: Secondary | ICD-10-CM | POA: Diagnosis not present

## 2022-03-08 DIAGNOSIS — R41841 Cognitive communication deficit: Secondary | ICD-10-CM | POA: Diagnosis not present

## 2022-03-08 DIAGNOSIS — F0390 Unspecified dementia without behavioral disturbance: Secondary | ICD-10-CM | POA: Diagnosis not present

## 2022-03-08 DIAGNOSIS — R1312 Dysphagia, oropharyngeal phase: Secondary | ICD-10-CM | POA: Diagnosis not present

## 2022-03-09 DIAGNOSIS — K219 Gastro-esophageal reflux disease without esophagitis: Secondary | ICD-10-CM | POA: Diagnosis not present

## 2022-03-09 DIAGNOSIS — F0390 Unspecified dementia without behavioral disturbance: Secondary | ICD-10-CM | POA: Diagnosis not present

## 2022-03-09 DIAGNOSIS — R41841 Cognitive communication deficit: Secondary | ICD-10-CM | POA: Diagnosis not present

## 2022-03-09 DIAGNOSIS — R1312 Dysphagia, oropharyngeal phase: Secondary | ICD-10-CM | POA: Diagnosis not present

## 2022-03-11 DIAGNOSIS — F0390 Unspecified dementia without behavioral disturbance: Secondary | ICD-10-CM | POA: Diagnosis not present

## 2022-03-11 DIAGNOSIS — R41841 Cognitive communication deficit: Secondary | ICD-10-CM | POA: Diagnosis not present

## 2022-03-11 DIAGNOSIS — R1312 Dysphagia, oropharyngeal phase: Secondary | ICD-10-CM | POA: Diagnosis not present

## 2022-03-11 DIAGNOSIS — K219 Gastro-esophageal reflux disease without esophagitis: Secondary | ICD-10-CM | POA: Diagnosis not present

## 2022-03-12 DIAGNOSIS — R1312 Dysphagia, oropharyngeal phase: Secondary | ICD-10-CM | POA: Diagnosis not present

## 2022-03-12 DIAGNOSIS — K219 Gastro-esophageal reflux disease without esophagitis: Secondary | ICD-10-CM | POA: Diagnosis not present

## 2022-03-12 DIAGNOSIS — R41841 Cognitive communication deficit: Secondary | ICD-10-CM | POA: Diagnosis not present

## 2022-03-12 DIAGNOSIS — F0390 Unspecified dementia without behavioral disturbance: Secondary | ICD-10-CM | POA: Diagnosis not present

## 2022-03-13 DIAGNOSIS — K219 Gastro-esophageal reflux disease without esophagitis: Secondary | ICD-10-CM | POA: Diagnosis not present

## 2022-03-13 DIAGNOSIS — R41841 Cognitive communication deficit: Secondary | ICD-10-CM | POA: Diagnosis not present

## 2022-03-13 DIAGNOSIS — R1312 Dysphagia, oropharyngeal phase: Secondary | ICD-10-CM | POA: Diagnosis not present

## 2022-03-13 DIAGNOSIS — F0390 Unspecified dementia without behavioral disturbance: Secondary | ICD-10-CM | POA: Diagnosis not present

## 2022-03-15 DIAGNOSIS — R1312 Dysphagia, oropharyngeal phase: Secondary | ICD-10-CM | POA: Diagnosis not present

## 2022-03-15 DIAGNOSIS — F0390 Unspecified dementia without behavioral disturbance: Secondary | ICD-10-CM | POA: Diagnosis not present

## 2022-03-15 DIAGNOSIS — R41841 Cognitive communication deficit: Secondary | ICD-10-CM | POA: Diagnosis not present

## 2022-03-15 DIAGNOSIS — K219 Gastro-esophageal reflux disease without esophagitis: Secondary | ICD-10-CM | POA: Diagnosis not present

## 2022-03-16 DIAGNOSIS — R1312 Dysphagia, oropharyngeal phase: Secondary | ICD-10-CM | POA: Diagnosis not present

## 2022-03-16 DIAGNOSIS — F0390 Unspecified dementia without behavioral disturbance: Secondary | ICD-10-CM | POA: Diagnosis not present

## 2022-03-16 DIAGNOSIS — K219 Gastro-esophageal reflux disease without esophagitis: Secondary | ICD-10-CM | POA: Diagnosis not present

## 2022-03-16 DIAGNOSIS — R41841 Cognitive communication deficit: Secondary | ICD-10-CM | POA: Diagnosis not present

## 2022-03-18 DIAGNOSIS — R41841 Cognitive communication deficit: Secondary | ICD-10-CM | POA: Diagnosis not present

## 2022-03-18 DIAGNOSIS — K219 Gastro-esophageal reflux disease without esophagitis: Secondary | ICD-10-CM | POA: Diagnosis not present

## 2022-03-18 DIAGNOSIS — R1312 Dysphagia, oropharyngeal phase: Secondary | ICD-10-CM | POA: Diagnosis not present

## 2022-03-18 DIAGNOSIS — F0390 Unspecified dementia without behavioral disturbance: Secondary | ICD-10-CM | POA: Diagnosis not present

## 2022-03-20 DIAGNOSIS — F0390 Unspecified dementia without behavioral disturbance: Secondary | ICD-10-CM | POA: Diagnosis not present

## 2022-03-20 DIAGNOSIS — R1312 Dysphagia, oropharyngeal phase: Secondary | ICD-10-CM | POA: Diagnosis not present

## 2022-03-20 DIAGNOSIS — K219 Gastro-esophageal reflux disease without esophagitis: Secondary | ICD-10-CM | POA: Diagnosis not present

## 2022-03-20 DIAGNOSIS — R41841 Cognitive communication deficit: Secondary | ICD-10-CM | POA: Diagnosis not present

## 2022-03-21 DIAGNOSIS — F0390 Unspecified dementia without behavioral disturbance: Secondary | ICD-10-CM | POA: Diagnosis not present

## 2022-03-21 DIAGNOSIS — K219 Gastro-esophageal reflux disease without esophagitis: Secondary | ICD-10-CM | POA: Diagnosis not present

## 2022-03-21 DIAGNOSIS — R41841 Cognitive communication deficit: Secondary | ICD-10-CM | POA: Diagnosis not present

## 2022-03-21 DIAGNOSIS — R1312 Dysphagia, oropharyngeal phase: Secondary | ICD-10-CM | POA: Diagnosis not present

## 2022-03-22 DIAGNOSIS — R1312 Dysphagia, oropharyngeal phase: Secondary | ICD-10-CM | POA: Diagnosis not present

## 2022-03-22 DIAGNOSIS — F0390 Unspecified dementia without behavioral disturbance: Secondary | ICD-10-CM | POA: Diagnosis not present

## 2022-03-22 DIAGNOSIS — R41841 Cognitive communication deficit: Secondary | ICD-10-CM | POA: Diagnosis not present

## 2022-03-22 DIAGNOSIS — K219 Gastro-esophageal reflux disease without esophagitis: Secondary | ICD-10-CM | POA: Diagnosis not present

## 2022-03-23 DIAGNOSIS — R41841 Cognitive communication deficit: Secondary | ICD-10-CM | POA: Diagnosis not present

## 2022-03-23 DIAGNOSIS — R1312 Dysphagia, oropharyngeal phase: Secondary | ICD-10-CM | POA: Diagnosis not present

## 2022-03-23 DIAGNOSIS — F0390 Unspecified dementia without behavioral disturbance: Secondary | ICD-10-CM | POA: Diagnosis not present

## 2022-03-23 DIAGNOSIS — K219 Gastro-esophageal reflux disease without esophagitis: Secondary | ICD-10-CM | POA: Diagnosis not present

## 2022-03-26 DIAGNOSIS — R41841 Cognitive communication deficit: Secondary | ICD-10-CM | POA: Diagnosis not present

## 2022-03-26 DIAGNOSIS — R1312 Dysphagia, oropharyngeal phase: Secondary | ICD-10-CM | POA: Diagnosis not present

## 2022-03-26 DIAGNOSIS — F0390 Unspecified dementia without behavioral disturbance: Secondary | ICD-10-CM | POA: Diagnosis not present

## 2022-03-26 DIAGNOSIS — K219 Gastro-esophageal reflux disease without esophagitis: Secondary | ICD-10-CM | POA: Diagnosis not present

## 2022-03-27 DIAGNOSIS — R41841 Cognitive communication deficit: Secondary | ICD-10-CM | POA: Diagnosis not present

## 2022-03-27 DIAGNOSIS — R1312 Dysphagia, oropharyngeal phase: Secondary | ICD-10-CM | POA: Diagnosis not present

## 2022-03-27 DIAGNOSIS — F0390 Unspecified dementia without behavioral disturbance: Secondary | ICD-10-CM | POA: Diagnosis not present

## 2022-03-27 DIAGNOSIS — K219 Gastro-esophageal reflux disease without esophagitis: Secondary | ICD-10-CM | POA: Diagnosis not present

## 2022-03-28 DIAGNOSIS — K219 Gastro-esophageal reflux disease without esophagitis: Secondary | ICD-10-CM | POA: Diagnosis not present

## 2022-03-28 DIAGNOSIS — R41841 Cognitive communication deficit: Secondary | ICD-10-CM | POA: Diagnosis not present

## 2022-03-28 DIAGNOSIS — R1312 Dysphagia, oropharyngeal phase: Secondary | ICD-10-CM | POA: Diagnosis not present

## 2022-03-28 DIAGNOSIS — F0390 Unspecified dementia without behavioral disturbance: Secondary | ICD-10-CM | POA: Diagnosis not present

## 2022-03-29 DIAGNOSIS — R41841 Cognitive communication deficit: Secondary | ICD-10-CM | POA: Diagnosis not present

## 2022-03-29 DIAGNOSIS — R1312 Dysphagia, oropharyngeal phase: Secondary | ICD-10-CM | POA: Diagnosis not present

## 2022-03-29 DIAGNOSIS — F0390 Unspecified dementia without behavioral disturbance: Secondary | ICD-10-CM | POA: Diagnosis not present

## 2022-03-29 DIAGNOSIS — K219 Gastro-esophageal reflux disease without esophagitis: Secondary | ICD-10-CM | POA: Diagnosis not present

## 2022-03-30 DIAGNOSIS — F0390 Unspecified dementia without behavioral disturbance: Secondary | ICD-10-CM | POA: Diagnosis not present

## 2022-03-30 DIAGNOSIS — R1312 Dysphagia, oropharyngeal phase: Secondary | ICD-10-CM | POA: Diagnosis not present

## 2022-03-30 DIAGNOSIS — R41841 Cognitive communication deficit: Secondary | ICD-10-CM | POA: Diagnosis not present

## 2022-03-30 DIAGNOSIS — K219 Gastro-esophageal reflux disease without esophagitis: Secondary | ICD-10-CM | POA: Diagnosis not present

## 2022-04-02 DIAGNOSIS — R1312 Dysphagia, oropharyngeal phase: Secondary | ICD-10-CM | POA: Diagnosis not present

## 2022-04-02 DIAGNOSIS — R41841 Cognitive communication deficit: Secondary | ICD-10-CM | POA: Diagnosis not present

## 2022-04-02 DIAGNOSIS — K219 Gastro-esophageal reflux disease without esophagitis: Secondary | ICD-10-CM | POA: Diagnosis not present

## 2022-04-02 DIAGNOSIS — F0390 Unspecified dementia without behavioral disturbance: Secondary | ICD-10-CM | POA: Diagnosis not present

## 2022-04-03 DIAGNOSIS — R41841 Cognitive communication deficit: Secondary | ICD-10-CM | POA: Diagnosis not present

## 2022-04-03 DIAGNOSIS — R1312 Dysphagia, oropharyngeal phase: Secondary | ICD-10-CM | POA: Diagnosis not present

## 2022-04-03 DIAGNOSIS — K219 Gastro-esophageal reflux disease without esophagitis: Secondary | ICD-10-CM | POA: Diagnosis not present

## 2022-04-03 DIAGNOSIS — F0390 Unspecified dementia without behavioral disturbance: Secondary | ICD-10-CM | POA: Diagnosis not present

## 2022-04-04 DIAGNOSIS — F0390 Unspecified dementia without behavioral disturbance: Secondary | ICD-10-CM | POA: Diagnosis not present

## 2022-04-04 DIAGNOSIS — K219 Gastro-esophageal reflux disease without esophagitis: Secondary | ICD-10-CM | POA: Diagnosis not present

## 2022-04-04 DIAGNOSIS — R1312 Dysphagia, oropharyngeal phase: Secondary | ICD-10-CM | POA: Diagnosis not present

## 2022-04-04 DIAGNOSIS — R41841 Cognitive communication deficit: Secondary | ICD-10-CM | POA: Diagnosis not present

## 2022-04-05 ENCOUNTER — Encounter: Payer: Self-pay | Admitting: Nurse Practitioner

## 2022-04-05 ENCOUNTER — Non-Acute Institutional Stay (SKILLED_NURSING_FACILITY): Payer: Medicare Other | Admitting: Nurse Practitioner

## 2022-04-05 DIAGNOSIS — M159 Polyosteoarthritis, unspecified: Secondary | ICD-10-CM | POA: Diagnosis not present

## 2022-04-05 DIAGNOSIS — K5901 Slow transit constipation: Secondary | ICD-10-CM | POA: Diagnosis not present

## 2022-04-05 DIAGNOSIS — N3281 Overactive bladder: Secondary | ICD-10-CM | POA: Diagnosis not present

## 2022-04-05 DIAGNOSIS — F0390 Unspecified dementia without behavioral disturbance: Secondary | ICD-10-CM | POA: Diagnosis not present

## 2022-04-05 DIAGNOSIS — K219 Gastro-esophageal reflux disease without esophagitis: Secondary | ICD-10-CM | POA: Diagnosis not present

## 2022-04-05 DIAGNOSIS — R41841 Cognitive communication deficit: Secondary | ICD-10-CM | POA: Diagnosis not present

## 2022-04-05 DIAGNOSIS — K269 Duodenal ulcer, unspecified as acute or chronic, without hemorrhage or perforation: Secondary | ICD-10-CM

## 2022-04-05 DIAGNOSIS — R269 Unspecified abnormalities of gait and mobility: Secondary | ICD-10-CM | POA: Diagnosis not present

## 2022-04-05 DIAGNOSIS — R1312 Dysphagia, oropharyngeal phase: Secondary | ICD-10-CM | POA: Diagnosis not present

## 2022-04-05 NOTE — Assessment & Plan Note (Signed)
uses walker for ambulation.   

## 2022-04-05 NOTE — Assessment & Plan Note (Signed)
managed on Myrbetriq

## 2022-04-05 NOTE — Assessment & Plan Note (Signed)
GERD/PUD/gastric erosions, stable, on Pantoprazole,  Hgb 13.5 09/05/21 

## 2022-04-05 NOTE — Progress Notes (Signed)
Location:  [facility] Room Number: NO/51/A Place of Service:  SNF (31) Provider:   Maesyn Frisinger X, NP  Patient Care Team: Ion Gonnella X, NP as PCP - General (Internal Medicine) [name] Eila Runyan X, NP as Nurse Practitioner (Nurse Practitioner) [name], MD as Attending Physician (Urology) [name], MD as Consulting Physician (Gastroenterology)  Extended Emergency Contact Information Primary Emergency Contact: Padin,[name] Address: [place] [address] [name] of [place] Phone: [phone] Relation: Son  Code Status:  DNR Goals of care: Advanced Directive information    04/05/2022   10:59 AM  Advanced Directives  Does Patient Have a Medical Advance Directive? Yes  Type of Paramedic of [place];Living will;Out of facility DNR (pink MOST or yellow form)  Does patient want to make changes to medical advance directive? No - Patient declined  Copy of [place] in Chart? Yes - validated most recent copy scanned in chart (See row information)  Pre-existing out of facility DNR order (yellow form or pink MOST form) Yellow form placed in chart (order not valid for inpatient use)     Chief Complaint  Patient presents with   Medical Management of Chronic Issues    Patient is here for a follow up for chronic conditions patient is also due for shingrix # 2  and flu vaccine, fall screening needed     HPI:  Pt is a 88 y.o. female seen today for managing chronic medial conditions  Dementia, on Memantine, Donepezil for memory. MMSE 20/30 09/10/19. TSH 1.11 09/05/21             Hx of OA, s/p L TKR 2013,  on Norco, Tylenol             Gait abnormalit, uses walker for ambulation.                    GERD/PUD/gastric erosions, stable, on Pantoprazole,  Hgb 13.5 09/05/21             Urinary frequency, managed on Myrbetriq              Constipation, stable, on MiraLax  daily.    Past Medical History:  Diagnosis Date   Allergic rhinitis due to pollen 07/01/2009   Anemia, unspecified 09/07/2010   Arthritis    Benign paroxysmal positional vertigo    Candidiasis 01/01/2012   Cramp of limb 01/11/2011   Disorder of bone and cartilage, unspecified 11/10/2010   Dizziness and giddiness 11/10/2010   Insomnia, unspecified 09/07/2010   Knee pain, left anterior    Lumbago 07/01/2009   Lump or mass in breast 08/01/2012   Memory loss 01/22/2012   Myalgia and myositis, unspecified 05/17/2011   Myopathy, unspecified    Osteoarthrosis, unspecified whether generalized or localized, lower leg    Other abnormal glucose 03/23/2010   Other chest pain 08/28/2012   Other disorders of bone and cartilage(733.99) 09/19/2012   Overactive bladder    Rash    inner legs- ? med allergy- to see PCP if doesnt improve by end of week   Rash and nonspecific skin eruption 10/28/2014   Right bundle branch block    Seborrheic keratoses 05/17/2011   Spasm of muscle 01/11/2011   Spinal stenosis, lumbar region, without neurogenic claudication    Stress incontinence 07/08/2014   Unspecified constipation 12/18/2011   Unspecified tinnitus    Unspecified urinary  incontinence    Urgency of urination    Urinary frequency 09/18/2012   Past Surgical History:  Procedure Laterality Date   ABDOMINAL HYSTERECTOMY     APPENDECTOMY  1956   BACK SURGERY     lumbar-- ? type  Dr Tonita Cong   BREAST LUMPECTOMY  1993   breast cyst   BREAST SURGERY     right lumpectomy   CLEFT LIP REPAIR     with repair palate   ESOPHAGOGASTRODUODENOSCOPY Left 03/10/2013   Procedure: ESOPHAGOGASTRODUODENOSCOPY (EGD);  Surgeon: Beryle Beams, MD;  Location: [facility];  Service: Endoscopy;  Laterality: Left;   EYE SURGERY     bilateral cataract extraction with IOL   TOTAL KNEE ARTHROPLASTY  12/13/2011   Procedure: TOTAL KNEE ARTHROPLASTY;  Surgeon: Johnn Hai, MD;  Location: [facility];  Service: Orthopedics;  Laterality: Left;     No Known Allergies  Outpatient Encounter Medications as of 04/05/2022  Medication Sig   acetaminophen (TYLENOL) 500 MG tablet Take 500 mg by mouth 2 (two) times daily.   donepezil (ARICEPT) 5 MG tablet Take 5 mg by mouth at bedtime.   HYDROcodone-acetaminophen (NORCO/VICODIN) 5-325 MG tablet Take 1 tablet by mouth 2 (two) times daily. For back pain   memantine (NAMENDA) 10 MG tablet Take 10 mg by mouth 2 (two) times daily.   MYRBETRIQ 50 MG TB24 tablet Take 50 mg by mouth daily.    pantoprazole (PROTONIX) 40 MG tablet Take 1 tablet (40 mg total) by mouth 2 (two) times daily.   polyethylene glycol (MIRALAX / GLYCOLAX) 17 g packet Take 17 g by mouth daily.   No facility-administered encounter medications on file as of 04/05/2022.    Review of Systems  Constitutional:  Negative for appetite change, fatigue and fever.  HENT:  Positive for hearing loss. Negative for congestion, sinus pressure and trouble swallowing.   Eyes:  Negative for visual disturbance.  Respiratory:  Negative for cough and wheezing.   Cardiovascular:  Negative for leg swelling.  Gastrointestinal:  Negative for abdominal pain.  Genitourinary:  Positive for frequency. Negative for dysuria and urgency.  Musculoskeletal:  Positive for arthralgias, back pain and gait problem.       Left knee pain  Skin:  Negative for color change.  Neurological:  Negative for speech difficulty, weakness and headaches.       Dementia  Psychiatric/Behavioral:  Positive for confusion. Negative for behavioral problems and sleep disturbance.     Immunization History  Administered Date(s) Administered   Influenza Whole 05/27/2012, 06/10/2013, 06/06/2018   Influenza, High Dose Seasonal PF 05/30/2019   Influenza-Unspecified 06/14/2014, 05/26/2015, 06/13/2016, 06/17/2017, 06/08/2020, 06/14/2021   Moderna Sars-Covid-2 Vaccination 08/29/2019, 09/26/2019, 07/05/2020, 01/24/2021   Pfizer Covid-19 Vaccine Bivalent Booster 64yr & up 05/16/2021    Pneumococcal Conjugate-13 06/24/2017   Pneumococcal Polysaccharide-23 10/03/2011   Td 10/03/2011   Tdap 10/31/2021   Zoster Recombinat (Shingrix) 10/31/2021   Zoster, Live 08/28/2007   Pertinent  Health Maintenance Due  Topic Date Due   INFLUENZA VACCINE  03/27/2022   DEXA SCAN  Completed      01/21/2014    2:14 PM 09/15/2015    3:29 PM 05/17/2017    9:30 AM 03/27/2019    5:06 PM  Fall Risk  Falls in the past year? No No No   Number of falls in past year - Comments    Emmi Telephone Survey Actual Response =    Functional Status Survey:    Vitals:   04/05/22 1054  BP: 118/74  Pulse: 68  Resp: 16  Temp: 98 F (36.7 C)  SpO2: 93%  Weight: 161 lb (73 kg)  Height: '5\' 6"'$  (1.676 m)   Body mass index is 25.99 kg/m. Physical Exam Vitals and nursing note reviewed.  Constitutional:      Appearance: Normal appearance.  HENT:     Head: Normocephalic and atraumatic.     Mouth/Throat:     Mouth: Mucous membranes are moist.  Eyes:     Extraocular Movements: Extraocular movements intact.     Conjunctiva/sclera:     Right eye: Right conjunctiva is not injected.     Left eye: Left conjunctiva is not injected.     Pupils: Pupils are equal, round, and reactive to light.  Cardiovascular:     Rate and Rhythm: Normal rate and regular rhythm.     Heart sounds: No murmur heard. Pulmonary:     Effort: Pulmonary effort is normal.     Breath sounds: No rales.  Abdominal:     General: Bowel sounds are normal.     Palpations: Abdomen is soft.     Tenderness: There is no abdominal tenderness.  Musculoskeletal:     Cervical back: Normal range of motion and neck supple.     Right lower leg: No edema.     Left lower leg: No edema.     Comments: Ambulates with walker. Left knee pain when walking.   Skin:    General: Skin is warm and dry.  Neurological:     General: No focal deficit present.     Mental Status: She is alert. Mental status is at baseline.     Gait: Gait abnormal.      Comments: Oriented to self, her room. Ambulates with walker with SBA  Psychiatric:        Mood and Affect: Mood normal.        Behavior: Behavior normal.     Labs reviewed: Recent Labs    09/05/21 0000  NA 141  K 4.1  CL 107  CO2 26*  BUN 15  CREATININE 0.7  CALCIUM 9.1   Recent Labs    09/05/21 0000  AST 11*  ALT 6*  ALKPHOS 107  ALBUMIN 3.3*   Recent Labs    09/05/21 0000  WBC 6.9  HGB 13.5  HCT 40  PLT 290   Lab Results  Component Value Date   TSH 1.11 09/05/2021   Lab Results  Component Value Date   HGBA1C 5.6 04/24/2016   Lab Results  Component Value Date   CHOL 189 09/06/2015   HDL 65 09/06/2015   LDLCALC 99 09/06/2015   TRIG 126 09/06/2015    Significant Diagnostic Results in last 30 days:  No results found.  Assessment/Plan Overactive bladder  managed on Myrbetriq   Slow transit constipation  stable, on MiraLax daily.   Duodenal ulcer without hemorrhage or perforation GERD/PUD/gastric erosions, stable, on Pantoprazole,  Hgb 13.5 09/05/21  Gait abnormality uses walker for ambulation.       Osteoarthritis, multiple sites  s/p L TKR 2013,  on Norco, Tylenol  Dementia without behavioral disturbance (HCC) on Memantine, Donepezil for memory. MMSE 20/30 09/10/19. TSH 1.11 09/05/21     Family/ staff Communication: plan of care reviewed with the patient and charge nurse.   Labs/tests ordered:  none  Time spend 35 minutes.

## 2022-04-05 NOTE — Assessment & Plan Note (Signed)
on Memantine, Donepezil for memory. MMSE 20/30 09/10/19. TSH 1.11 09/05/21

## 2022-04-05 NOTE — Assessment & Plan Note (Signed)
stable, on MiraLax daily.  

## 2022-04-05 NOTE — Assessment & Plan Note (Signed)
s/p L TKR 2013,  on Norco, Tylenol 

## 2022-04-06 DIAGNOSIS — K219 Gastro-esophageal reflux disease without esophagitis: Secondary | ICD-10-CM | POA: Diagnosis not present

## 2022-04-06 DIAGNOSIS — R1312 Dysphagia, oropharyngeal phase: Secondary | ICD-10-CM | POA: Diagnosis not present

## 2022-04-06 DIAGNOSIS — F0390 Unspecified dementia without behavioral disturbance: Secondary | ICD-10-CM | POA: Diagnosis not present

## 2022-04-06 DIAGNOSIS — R41841 Cognitive communication deficit: Secondary | ICD-10-CM | POA: Diagnosis not present

## 2022-04-09 ENCOUNTER — Encounter: Payer: Self-pay | Admitting: Nurse Practitioner

## 2022-04-09 DIAGNOSIS — R1312 Dysphagia, oropharyngeal phase: Secondary | ICD-10-CM | POA: Diagnosis not present

## 2022-04-09 DIAGNOSIS — K219 Gastro-esophageal reflux disease without esophagitis: Secondary | ICD-10-CM | POA: Diagnosis not present

## 2022-04-09 DIAGNOSIS — F0390 Unspecified dementia without behavioral disturbance: Secondary | ICD-10-CM | POA: Diagnosis not present

## 2022-04-09 DIAGNOSIS — R41841 Cognitive communication deficit: Secondary | ICD-10-CM | POA: Diagnosis not present

## 2022-04-10 DIAGNOSIS — K219 Gastro-esophageal reflux disease without esophagitis: Secondary | ICD-10-CM | POA: Diagnosis not present

## 2022-04-10 DIAGNOSIS — R1312 Dysphagia, oropharyngeal phase: Secondary | ICD-10-CM | POA: Diagnosis not present

## 2022-04-10 DIAGNOSIS — R41841 Cognitive communication deficit: Secondary | ICD-10-CM | POA: Diagnosis not present

## 2022-04-10 DIAGNOSIS — F0390 Unspecified dementia without behavioral disturbance: Secondary | ICD-10-CM | POA: Diagnosis not present

## 2022-04-11 DIAGNOSIS — F0390 Unspecified dementia without behavioral disturbance: Secondary | ICD-10-CM | POA: Diagnosis not present

## 2022-04-11 DIAGNOSIS — R41841 Cognitive communication deficit: Secondary | ICD-10-CM | POA: Diagnosis not present

## 2022-04-11 DIAGNOSIS — K219 Gastro-esophageal reflux disease without esophagitis: Secondary | ICD-10-CM | POA: Diagnosis not present

## 2022-04-11 DIAGNOSIS — R1312 Dysphagia, oropharyngeal phase: Secondary | ICD-10-CM | POA: Diagnosis not present

## 2022-04-12 DIAGNOSIS — R1312 Dysphagia, oropharyngeal phase: Secondary | ICD-10-CM | POA: Diagnosis not present

## 2022-04-12 DIAGNOSIS — K219 Gastro-esophageal reflux disease without esophagitis: Secondary | ICD-10-CM | POA: Diagnosis not present

## 2022-04-12 DIAGNOSIS — R41841 Cognitive communication deficit: Secondary | ICD-10-CM | POA: Diagnosis not present

## 2022-04-12 DIAGNOSIS — F0390 Unspecified dementia without behavioral disturbance: Secondary | ICD-10-CM | POA: Diagnosis not present

## 2022-04-13 DIAGNOSIS — K219 Gastro-esophageal reflux disease without esophagitis: Secondary | ICD-10-CM | POA: Diagnosis not present

## 2022-04-13 DIAGNOSIS — F0390 Unspecified dementia without behavioral disturbance: Secondary | ICD-10-CM | POA: Diagnosis not present

## 2022-04-13 DIAGNOSIS — R41841 Cognitive communication deficit: Secondary | ICD-10-CM | POA: Diagnosis not present

## 2022-04-13 DIAGNOSIS — R1312 Dysphagia, oropharyngeal phase: Secondary | ICD-10-CM | POA: Diagnosis not present

## 2022-04-16 DIAGNOSIS — R1312 Dysphagia, oropharyngeal phase: Secondary | ICD-10-CM | POA: Diagnosis not present

## 2022-04-16 DIAGNOSIS — R41841 Cognitive communication deficit: Secondary | ICD-10-CM | POA: Diagnosis not present

## 2022-04-16 DIAGNOSIS — F0390 Unspecified dementia without behavioral disturbance: Secondary | ICD-10-CM | POA: Diagnosis not present

## 2022-04-16 DIAGNOSIS — K219 Gastro-esophageal reflux disease without esophagitis: Secondary | ICD-10-CM | POA: Diagnosis not present

## 2022-04-17 DIAGNOSIS — K219 Gastro-esophageal reflux disease without esophagitis: Secondary | ICD-10-CM | POA: Diagnosis not present

## 2022-04-17 DIAGNOSIS — R1312 Dysphagia, oropharyngeal phase: Secondary | ICD-10-CM | POA: Diagnosis not present

## 2022-04-17 DIAGNOSIS — R41841 Cognitive communication deficit: Secondary | ICD-10-CM | POA: Diagnosis not present

## 2022-04-17 DIAGNOSIS — F0390 Unspecified dementia without behavioral disturbance: Secondary | ICD-10-CM | POA: Diagnosis not present

## 2022-04-18 DIAGNOSIS — F0390 Unspecified dementia without behavioral disturbance: Secondary | ICD-10-CM | POA: Diagnosis not present

## 2022-04-18 DIAGNOSIS — R41841 Cognitive communication deficit: Secondary | ICD-10-CM | POA: Diagnosis not present

## 2022-04-18 DIAGNOSIS — R1312 Dysphagia, oropharyngeal phase: Secondary | ICD-10-CM | POA: Diagnosis not present

## 2022-04-18 DIAGNOSIS — K219 Gastro-esophageal reflux disease without esophagitis: Secondary | ICD-10-CM | POA: Diagnosis not present

## 2022-04-19 DIAGNOSIS — R1312 Dysphagia, oropharyngeal phase: Secondary | ICD-10-CM | POA: Diagnosis not present

## 2022-04-19 DIAGNOSIS — R41841 Cognitive communication deficit: Secondary | ICD-10-CM | POA: Diagnosis not present

## 2022-04-19 DIAGNOSIS — F0390 Unspecified dementia without behavioral disturbance: Secondary | ICD-10-CM | POA: Diagnosis not present

## 2022-04-19 DIAGNOSIS — K219 Gastro-esophageal reflux disease without esophagitis: Secondary | ICD-10-CM | POA: Diagnosis not present

## 2022-04-20 DIAGNOSIS — K219 Gastro-esophageal reflux disease without esophagitis: Secondary | ICD-10-CM | POA: Diagnosis not present

## 2022-04-20 DIAGNOSIS — R41841 Cognitive communication deficit: Secondary | ICD-10-CM | POA: Diagnosis not present

## 2022-04-20 DIAGNOSIS — F0390 Unspecified dementia without behavioral disturbance: Secondary | ICD-10-CM | POA: Diagnosis not present

## 2022-04-20 DIAGNOSIS — R1312 Dysphagia, oropharyngeal phase: Secondary | ICD-10-CM | POA: Diagnosis not present

## 2022-04-23 DIAGNOSIS — R1312 Dysphagia, oropharyngeal phase: Secondary | ICD-10-CM | POA: Diagnosis not present

## 2022-04-23 DIAGNOSIS — K219 Gastro-esophageal reflux disease without esophagitis: Secondary | ICD-10-CM | POA: Diagnosis not present

## 2022-04-23 DIAGNOSIS — R41841 Cognitive communication deficit: Secondary | ICD-10-CM | POA: Diagnosis not present

## 2022-04-23 DIAGNOSIS — F0390 Unspecified dementia without behavioral disturbance: Secondary | ICD-10-CM | POA: Diagnosis not present

## 2022-04-24 DIAGNOSIS — F0390 Unspecified dementia without behavioral disturbance: Secondary | ICD-10-CM | POA: Diagnosis not present

## 2022-04-24 DIAGNOSIS — R41841 Cognitive communication deficit: Secondary | ICD-10-CM | POA: Diagnosis not present

## 2022-04-24 DIAGNOSIS — R1312 Dysphagia, oropharyngeal phase: Secondary | ICD-10-CM | POA: Diagnosis not present

## 2022-04-24 DIAGNOSIS — K219 Gastro-esophageal reflux disease without esophagitis: Secondary | ICD-10-CM | POA: Diagnosis not present

## 2022-04-25 DIAGNOSIS — F0390 Unspecified dementia without behavioral disturbance: Secondary | ICD-10-CM | POA: Diagnosis not present

## 2022-04-25 DIAGNOSIS — R1312 Dysphagia, oropharyngeal phase: Secondary | ICD-10-CM | POA: Diagnosis not present

## 2022-04-25 DIAGNOSIS — R41841 Cognitive communication deficit: Secondary | ICD-10-CM | POA: Diagnosis not present

## 2022-04-25 DIAGNOSIS — K219 Gastro-esophageal reflux disease without esophagitis: Secondary | ICD-10-CM | POA: Diagnosis not present

## 2022-04-26 DIAGNOSIS — R41841 Cognitive communication deficit: Secondary | ICD-10-CM | POA: Diagnosis not present

## 2022-04-26 DIAGNOSIS — F0390 Unspecified dementia without behavioral disturbance: Secondary | ICD-10-CM | POA: Diagnosis not present

## 2022-04-26 DIAGNOSIS — K219 Gastro-esophageal reflux disease without esophagitis: Secondary | ICD-10-CM | POA: Diagnosis not present

## 2022-04-26 DIAGNOSIS — R1312 Dysphagia, oropharyngeal phase: Secondary | ICD-10-CM | POA: Diagnosis not present

## 2022-04-27 DIAGNOSIS — K219 Gastro-esophageal reflux disease without esophagitis: Secondary | ICD-10-CM | POA: Diagnosis not present

## 2022-04-27 DIAGNOSIS — R41841 Cognitive communication deficit: Secondary | ICD-10-CM | POA: Diagnosis not present

## 2022-04-27 DIAGNOSIS — R1312 Dysphagia, oropharyngeal phase: Secondary | ICD-10-CM | POA: Diagnosis not present

## 2022-04-27 DIAGNOSIS — F0390 Unspecified dementia without behavioral disturbance: Secondary | ICD-10-CM | POA: Diagnosis not present

## 2022-04-30 DIAGNOSIS — R41841 Cognitive communication deficit: Secondary | ICD-10-CM | POA: Diagnosis not present

## 2022-04-30 DIAGNOSIS — R1312 Dysphagia, oropharyngeal phase: Secondary | ICD-10-CM | POA: Diagnosis not present

## 2022-04-30 DIAGNOSIS — K219 Gastro-esophageal reflux disease without esophagitis: Secondary | ICD-10-CM | POA: Diagnosis not present

## 2022-04-30 DIAGNOSIS — F0390 Unspecified dementia without behavioral disturbance: Secondary | ICD-10-CM | POA: Diagnosis not present

## 2022-05-01 ENCOUNTER — Non-Acute Institutional Stay (SKILLED_NURSING_FACILITY): Payer: Medicare Other | Admitting: Family Medicine

## 2022-05-01 ENCOUNTER — Encounter: Payer: Self-pay | Admitting: Family Medicine

## 2022-05-01 DIAGNOSIS — R41841 Cognitive communication deficit: Secondary | ICD-10-CM | POA: Diagnosis not present

## 2022-05-01 DIAGNOSIS — M159 Polyosteoarthritis, unspecified: Secondary | ICD-10-CM | POA: Diagnosis not present

## 2022-05-01 DIAGNOSIS — H8110 Benign paroxysmal vertigo, unspecified ear: Secondary | ICD-10-CM | POA: Diagnosis not present

## 2022-05-01 DIAGNOSIS — N3281 Overactive bladder: Secondary | ICD-10-CM

## 2022-05-01 DIAGNOSIS — F0390 Unspecified dementia without behavioral disturbance: Secondary | ICD-10-CM | POA: Diagnosis not present

## 2022-05-01 DIAGNOSIS — R1312 Dysphagia, oropharyngeal phase: Secondary | ICD-10-CM | POA: Diagnosis not present

## 2022-05-01 DIAGNOSIS — K219 Gastro-esophageal reflux disease without esophagitis: Secondary | ICD-10-CM | POA: Diagnosis not present

## 2022-05-01 NOTE — Progress Notes (Signed)
Location:  [place] Room Number: 51-A Place of Service:  SNF [phone]) Provider:  Lillette Boxer. Taraya Steward,MD  Mast, Man X, NP  Patient Care Team: Mast, Man X, NP as PCP - General (Internal Medicine) [name] Mast, Man X, NP as Nurse Practitioner (Nurse Practitioner) [name], MD as Attending Physician (Urology) [name], MD as Consulting Physician (Gastroenterology)  Extended Emergency Contact Information Primary Emergency Contact: Beckum,[name] Address: [place] [address] [name] of [place] Phone: [phone] Relation: Son  Code Status:  DNR Goals of care: Advanced Directive information    05/01/2022   11:21 AM  Advanced Directives  Does Patient Have a Medical Advance Directive? Yes  Type of Paramedic of [place];Living will;Out of facility DNR (pink MOST or yellow form)  Does patient want to make changes to medical advance directive? No - Patient declined  Copy of [place] in Chart? Yes - validated most recent copy scanned in chart (See row information)  Pre-existing out of facility DNR order (yellow form or pink MOST form) Yellow form placed in chart (order not valid for inpatient use)     Chief Complaint  Patient presents with   Routine    HPI:  Pt is a 88 y.o. female seen today for medical management of chronic diseases.  Including osteoarthritis, urinary frequency, constipation, and dementia with MMSE of 20/30 Visit took place in her room.  She answered simple questions appropriately.  She denies any specific complaints today. For her degenerative arthritis she takes Tylenol as well as hydrocodone.  She does not endorse any recent pains.  She uses a walker for ambulation. For constipation she takes MiraLAX and for urinary frequency Myrbetriq   Past Medical History:  Diagnosis Date   Allergic rhinitis due to pollen 07/01/2009   Anemia,  unspecified 09/07/2010   Arthritis    Benign paroxysmal positional vertigo    Candidiasis 01/01/2012   Cramp of limb 01/11/2011   Disorder of bone and cartilage, unspecified 11/10/2010   Dizziness and giddiness 11/10/2010   Insomnia, unspecified 09/07/2010   Knee pain, left anterior    Lumbago 07/01/2009   Lump or mass in breast 08/01/2012   Memory loss 01/22/2012   Myalgia and myositis, unspecified 05/17/2011   Myopathy, unspecified    Osteoarthrosis, unspecified whether generalized or localized, lower leg    Other abnormal glucose 03/23/2010   Other chest pain 08/28/2012   Other disorders of bone and cartilage(733.99) 09/19/2012   Overactive bladder    Rash    inner legs- ? med allergy- to see PCP if doesnt improve by end of week   Rash and nonspecific skin eruption 10/28/2014   Right bundle branch block    Seborrheic keratoses 05/17/2011   Spasm of muscle 01/11/2011   Spinal stenosis, lumbar region, without neurogenic claudication    Stress incontinence 07/08/2014   Unspecified constipation 12/18/2011   Unspecified tinnitus    Unspecified urinary incontinence    Urgency of urination    Urinary frequency 09/18/2012   Past Surgical History:  Procedure Laterality Date   ABDOMINAL HYSTERECTOMY     APPENDECTOMY  1956   BACK SURGERY     lumbar-- ? type  Dr Tonita Cong   BREAST LUMPECTOMY  1993   breast cyst   BREAST SURGERY     right lumpectomy   CLEFT LIP REPAIR     with repair palate  ESOPHAGOGASTRODUODENOSCOPY Left 03/10/2013   Procedure: ESOPHAGOGASTRODUODENOSCOPY (EGD);  Surgeon: Beryle Beams, MD;  Location: [facility];  Service: Endoscopy;  Laterality: Left;   EYE SURGERY     bilateral cataract extraction with IOL   TOTAL KNEE ARTHROPLASTY  12/13/2011   Procedure: TOTAL KNEE ARTHROPLASTY;  Surgeon: Johnn Hai, MD;  Location: [facility];  Service: Orthopedics;  Laterality: Left;    No Known Allergies  Outpatient Encounter Medications as of 05/01/2022  Medication Sig   acetaminophen  (TYLENOL) 500 MG tablet Take 500 mg by mouth 2 (two) times daily.   donepezil (ARICEPT) 5 MG tablet Take 5 mg by mouth at bedtime.   HYDROcodone-acetaminophen (NORCO/VICODIN) 5-325 MG tablet Take 1 tablet by mouth 2 (two) times daily. For back pain   memantine (NAMENDA) 10 MG tablet Take 10 mg by mouth 2 (two) times daily.   MYRBETRIQ 50 MG TB24 tablet Take 50 mg by mouth daily.    pantoprazole (PROTONIX) 40 MG tablet Take 1 tablet (40 mg total) by mouth 2 (two) times daily.   polyethylene glycol (MIRALAX / GLYCOLAX) 17 g packet Take 17 g by mouth daily.   No facility-administered encounter medications on file as of 05/01/2022.    Review of Systems  Constitutional: Negative.   Respiratory: Negative.    Cardiovascular: Negative.   Gastrointestinal:  Positive for constipation.  Genitourinary:  Positive for frequency.  Musculoskeletal:  Positive for gait problem.  Psychiatric/Behavioral:  Positive for confusion.     Immunization History  Administered Date(s) Administered   Influenza Whole 05/27/2012, 06/10/2013, 06/06/2018   Influenza, High Dose Seasonal PF 05/30/2019   Influenza-Unspecified 06/14/2014, 05/26/2015, 06/13/2016, 06/17/2017, 06/08/2020, 06/14/2021   Moderna SARS-COV2 Booster Vaccination 01/12/2022   Moderna Sars-Covid-2 Vaccination 08/29/2019, 09/26/2019, 07/05/2020, 01/24/2021   Pfizer Covid-19 Vaccine Bivalent Booster 21yr & up 05/16/2021   Pneumococcal Conjugate-13 06/24/2017   Pneumococcal Polysaccharide-23 10/03/2011   Td 10/03/2011   Tdap 10/31/2021   Zoster Recombinat (Shingrix) 10/31/2021, 01/31/2022   Zoster, Live 08/28/2007   Pertinent  Health Maintenance Due  Topic Date Due   INFLUENZA VACCINE  03/27/2022   DEXA SCAN  Completed      01/21/2014    2:14 PM 09/15/2015    3:29 PM 05/17/2017    9:30 AM 03/27/2019    5:06 PM  Fall Risk  Falls in the past year? No No No   Number of falls in past year - Comments    Emmi Telephone Survey Actual Response =     Functional Status Survey:    Vitals:   05/01/22 1116  BP: 124/82  Pulse: 80  Resp: 19  Temp: 98 F (36.7 C)  SpO2: 97%  Weight: 164 lb 12.8 oz (74.8 kg)  Height: '5\' 6"'$  (1.676 m)   Body mass index is 26.6 kg/m. Physical Exam Vitals and nursing note reviewed.  Constitutional:      Appearance: Normal appearance.  Cardiovascular:     Rate and Rhythm: Normal rate and regular rhythm.  Pulmonary:     Effort: Pulmonary effort is normal.     Breath sounds: Normal breath sounds.  Neurological:     General: No focal deficit present.     Mental Status: She is alert.     Comments: Oriented to person  Psychiatric:        Mood and Affect: Mood normal.        Behavior: Behavior normal.     Labs reviewed: Recent Labs    09/05/21 0000  NA 141  K 4.1  CL 107  CO2 26*  BUN 15  CREATININE 0.7  CALCIUM 9.1   Recent Labs    09/05/21 0000  AST 11*  ALT 6*  ALKPHOS 107  ALBUMIN 3.3*   Recent Labs    09/05/21 0000  WBC 6.9  HGB 13.5  HCT 40  PLT 290   Lab Results  Component Value Date   TSH 1.11 09/05/2021   Lab Results  Component Value Date   HGBA1C 5.6 04/24/2016   Lab Results  Component Value Date   CHOL 189 09/06/2015   HDL 65 09/06/2015   LDLCALC 99 09/06/2015   TRIG 126 09/06/2015    Significant Diagnostic Results in last 30 days:  No results found.  Assessment/Plan There are no diagnoses linked to this encounter. 1. Benign paroxysmal positional vertigo, unspecified laterality Still has occasional episode of dizziness but has improved  2. Dementia without behavioral disturbance ([place]) MMSE is consistent with dementia.  Being treated appropriately with medicines available  3. Overactive bladder Myrbetriq is helpful  4. Primary osteoarthritis involving multiple joints I do not have a sense that she is requiring narcotic.  Certainly Tylenol arthritis or extra strength is appropriate   Family/ staff Communication:   Labs/tests ordered:

## 2022-05-02 ENCOUNTER — Other Ambulatory Visit: Payer: Self-pay | Admitting: Adult Health

## 2022-05-02 DIAGNOSIS — F0390 Unspecified dementia without behavioral disturbance: Secondary | ICD-10-CM | POA: Diagnosis not present

## 2022-05-02 DIAGNOSIS — K219 Gastro-esophageal reflux disease without esophagitis: Secondary | ICD-10-CM | POA: Diagnosis not present

## 2022-05-02 DIAGNOSIS — R41841 Cognitive communication deficit: Secondary | ICD-10-CM | POA: Diagnosis not present

## 2022-05-02 DIAGNOSIS — R1312 Dysphagia, oropharyngeal phase: Secondary | ICD-10-CM | POA: Diagnosis not present

## 2022-05-02 DIAGNOSIS — M159 Polyosteoarthritis, unspecified: Secondary | ICD-10-CM

## 2022-05-02 MED ORDER — HYDROCODONE-ACETAMINOPHEN 5-325 MG PO TABS: 1.0000 | ORAL_TABLET | Freq: Two times a day (BID) | ORAL | 0 refills | Status: DC

## 2022-05-03 DIAGNOSIS — R1312 Dysphagia, oropharyngeal phase: Secondary | ICD-10-CM | POA: Diagnosis not present

## 2022-05-03 DIAGNOSIS — F0390 Unspecified dementia without behavioral disturbance: Secondary | ICD-10-CM | POA: Diagnosis not present

## 2022-05-03 DIAGNOSIS — K219 Gastro-esophageal reflux disease without esophagitis: Secondary | ICD-10-CM | POA: Diagnosis not present

## 2022-05-03 DIAGNOSIS — R41841 Cognitive communication deficit: Secondary | ICD-10-CM | POA: Diagnosis not present

## 2022-05-04 DIAGNOSIS — K219 Gastro-esophageal reflux disease without esophagitis: Secondary | ICD-10-CM | POA: Diagnosis not present

## 2022-05-04 DIAGNOSIS — R1312 Dysphagia, oropharyngeal phase: Secondary | ICD-10-CM | POA: Diagnosis not present

## 2022-05-04 DIAGNOSIS — R41841 Cognitive communication deficit: Secondary | ICD-10-CM | POA: Diagnosis not present

## 2022-05-04 DIAGNOSIS — F0390 Unspecified dementia without behavioral disturbance: Secondary | ICD-10-CM | POA: Diagnosis not present

## 2022-05-07 DIAGNOSIS — R1312 Dysphagia, oropharyngeal phase: Secondary | ICD-10-CM | POA: Diagnosis not present

## 2022-05-07 DIAGNOSIS — F0390 Unspecified dementia without behavioral disturbance: Secondary | ICD-10-CM | POA: Diagnosis not present

## 2022-05-07 DIAGNOSIS — K219 Gastro-esophageal reflux disease without esophagitis: Secondary | ICD-10-CM | POA: Diagnosis not present

## 2022-05-07 DIAGNOSIS — R41841 Cognitive communication deficit: Secondary | ICD-10-CM | POA: Diagnosis not present

## 2022-05-08 DIAGNOSIS — K219 Gastro-esophageal reflux disease without esophagitis: Secondary | ICD-10-CM | POA: Diagnosis not present

## 2022-05-08 DIAGNOSIS — R41841 Cognitive communication deficit: Secondary | ICD-10-CM | POA: Diagnosis not present

## 2022-05-08 DIAGNOSIS — R1312 Dysphagia, oropharyngeal phase: Secondary | ICD-10-CM | POA: Diagnosis not present

## 2022-05-08 DIAGNOSIS — F0390 Unspecified dementia without behavioral disturbance: Secondary | ICD-10-CM | POA: Diagnosis not present

## 2022-05-09 DIAGNOSIS — R1312 Dysphagia, oropharyngeal phase: Secondary | ICD-10-CM | POA: Diagnosis not present

## 2022-05-09 DIAGNOSIS — F0390 Unspecified dementia without behavioral disturbance: Secondary | ICD-10-CM | POA: Diagnosis not present

## 2022-05-09 DIAGNOSIS — K219 Gastro-esophageal reflux disease without esophagitis: Secondary | ICD-10-CM | POA: Diagnosis not present

## 2022-05-09 DIAGNOSIS — R41841 Cognitive communication deficit: Secondary | ICD-10-CM | POA: Diagnosis not present

## 2022-05-10 DIAGNOSIS — R1312 Dysphagia, oropharyngeal phase: Secondary | ICD-10-CM | POA: Diagnosis not present

## 2022-05-10 DIAGNOSIS — K219 Gastro-esophageal reflux disease without esophagitis: Secondary | ICD-10-CM | POA: Diagnosis not present

## 2022-05-10 DIAGNOSIS — F0390 Unspecified dementia without behavioral disturbance: Secondary | ICD-10-CM | POA: Diagnosis not present

## 2022-05-10 DIAGNOSIS — R41841 Cognitive communication deficit: Secondary | ICD-10-CM | POA: Diagnosis not present

## 2022-05-11 DIAGNOSIS — R41841 Cognitive communication deficit: Secondary | ICD-10-CM | POA: Diagnosis not present

## 2022-05-11 DIAGNOSIS — K219 Gastro-esophageal reflux disease without esophagitis: Secondary | ICD-10-CM | POA: Diagnosis not present

## 2022-05-11 DIAGNOSIS — F0390 Unspecified dementia without behavioral disturbance: Secondary | ICD-10-CM | POA: Diagnosis not present

## 2022-05-11 DIAGNOSIS — R1312 Dysphagia, oropharyngeal phase: Secondary | ICD-10-CM | POA: Diagnosis not present

## 2022-05-14 DIAGNOSIS — R41841 Cognitive communication deficit: Secondary | ICD-10-CM | POA: Diagnosis not present

## 2022-05-14 DIAGNOSIS — K219 Gastro-esophageal reflux disease without esophagitis: Secondary | ICD-10-CM | POA: Diagnosis not present

## 2022-05-14 DIAGNOSIS — F0390 Unspecified dementia without behavioral disturbance: Secondary | ICD-10-CM | POA: Diagnosis not present

## 2022-05-14 DIAGNOSIS — R1312 Dysphagia, oropharyngeal phase: Secondary | ICD-10-CM | POA: Diagnosis not present

## 2022-05-15 DIAGNOSIS — R41841 Cognitive communication deficit: Secondary | ICD-10-CM | POA: Diagnosis not present

## 2022-05-15 DIAGNOSIS — F0390 Unspecified dementia without behavioral disturbance: Secondary | ICD-10-CM | POA: Diagnosis not present

## 2022-05-15 DIAGNOSIS — R1312 Dysphagia, oropharyngeal phase: Secondary | ICD-10-CM | POA: Diagnosis not present

## 2022-05-15 DIAGNOSIS — K219 Gastro-esophageal reflux disease without esophagitis: Secondary | ICD-10-CM | POA: Diagnosis not present

## 2022-05-16 DIAGNOSIS — F0390 Unspecified dementia without behavioral disturbance: Secondary | ICD-10-CM | POA: Diagnosis not present

## 2022-05-16 DIAGNOSIS — K219 Gastro-esophageal reflux disease without esophagitis: Secondary | ICD-10-CM | POA: Diagnosis not present

## 2022-05-16 DIAGNOSIS — R41841 Cognitive communication deficit: Secondary | ICD-10-CM | POA: Diagnosis not present

## 2022-05-16 DIAGNOSIS — R1312 Dysphagia, oropharyngeal phase: Secondary | ICD-10-CM | POA: Diagnosis not present

## 2022-05-17 DIAGNOSIS — K219 Gastro-esophageal reflux disease without esophagitis: Secondary | ICD-10-CM | POA: Diagnosis not present

## 2022-05-17 DIAGNOSIS — F0390 Unspecified dementia without behavioral disturbance: Secondary | ICD-10-CM | POA: Diagnosis not present

## 2022-05-17 DIAGNOSIS — R1312 Dysphagia, oropharyngeal phase: Secondary | ICD-10-CM | POA: Diagnosis not present

## 2022-05-17 DIAGNOSIS — R41841 Cognitive communication deficit: Secondary | ICD-10-CM | POA: Diagnosis not present

## 2022-05-18 DIAGNOSIS — K219 Gastro-esophageal reflux disease without esophagitis: Secondary | ICD-10-CM | POA: Diagnosis not present

## 2022-05-18 DIAGNOSIS — F0390 Unspecified dementia without behavioral disturbance: Secondary | ICD-10-CM | POA: Diagnosis not present

## 2022-05-18 DIAGNOSIS — R1312 Dysphagia, oropharyngeal phase: Secondary | ICD-10-CM | POA: Diagnosis not present

## 2022-05-18 DIAGNOSIS — R41841 Cognitive communication deficit: Secondary | ICD-10-CM | POA: Diagnosis not present

## 2022-05-21 DIAGNOSIS — R41841 Cognitive communication deficit: Secondary | ICD-10-CM | POA: Diagnosis not present

## 2022-05-21 DIAGNOSIS — R1312 Dysphagia, oropharyngeal phase: Secondary | ICD-10-CM | POA: Diagnosis not present

## 2022-05-21 DIAGNOSIS — K219 Gastro-esophageal reflux disease without esophagitis: Secondary | ICD-10-CM | POA: Diagnosis not present

## 2022-05-21 DIAGNOSIS — F0390 Unspecified dementia without behavioral disturbance: Secondary | ICD-10-CM | POA: Diagnosis not present

## 2022-05-22 DIAGNOSIS — R1312 Dysphagia, oropharyngeal phase: Secondary | ICD-10-CM | POA: Diagnosis not present

## 2022-05-22 DIAGNOSIS — R41841 Cognitive communication deficit: Secondary | ICD-10-CM | POA: Diagnosis not present

## 2022-05-22 DIAGNOSIS — F0390 Unspecified dementia without behavioral disturbance: Secondary | ICD-10-CM | POA: Diagnosis not present

## 2022-05-22 DIAGNOSIS — K219 Gastro-esophageal reflux disease without esophagitis: Secondary | ICD-10-CM | POA: Diagnosis not present

## 2022-05-23 DIAGNOSIS — K219 Gastro-esophageal reflux disease without esophagitis: Secondary | ICD-10-CM | POA: Diagnosis not present

## 2022-05-23 DIAGNOSIS — F0390 Unspecified dementia without behavioral disturbance: Secondary | ICD-10-CM | POA: Diagnosis not present

## 2022-05-23 DIAGNOSIS — R1312 Dysphagia, oropharyngeal phase: Secondary | ICD-10-CM | POA: Diagnosis not present

## 2022-05-23 DIAGNOSIS — R41841 Cognitive communication deficit: Secondary | ICD-10-CM | POA: Diagnosis not present

## 2022-05-24 DIAGNOSIS — F0390 Unspecified dementia without behavioral disturbance: Secondary | ICD-10-CM | POA: Diagnosis not present

## 2022-05-24 DIAGNOSIS — R41841 Cognitive communication deficit: Secondary | ICD-10-CM | POA: Diagnosis not present

## 2022-05-24 DIAGNOSIS — K219 Gastro-esophageal reflux disease without esophagitis: Secondary | ICD-10-CM | POA: Diagnosis not present

## 2022-05-24 DIAGNOSIS — R1312 Dysphagia, oropharyngeal phase: Secondary | ICD-10-CM | POA: Diagnosis not present

## 2022-05-25 DIAGNOSIS — F0390 Unspecified dementia without behavioral disturbance: Secondary | ICD-10-CM | POA: Diagnosis not present

## 2022-05-25 DIAGNOSIS — R41841 Cognitive communication deficit: Secondary | ICD-10-CM | POA: Diagnosis not present

## 2022-05-25 DIAGNOSIS — R1312 Dysphagia, oropharyngeal phase: Secondary | ICD-10-CM | POA: Diagnosis not present

## 2022-05-25 DIAGNOSIS — K219 Gastro-esophageal reflux disease without esophagitis: Secondary | ICD-10-CM | POA: Diagnosis not present

## 2022-05-29 ENCOUNTER — Encounter: Payer: Self-pay | Admitting: Nurse Practitioner

## 2022-05-29 ENCOUNTER — Non-Acute Institutional Stay (SKILLED_NURSING_FACILITY): Payer: Medicare Other | Admitting: Nurse Practitioner

## 2022-05-29 DIAGNOSIS — H8110 Benign paroxysmal vertigo, unspecified ear: Secondary | ICD-10-CM

## 2022-05-29 DIAGNOSIS — M25562 Pain in left knee: Secondary | ICD-10-CM

## 2022-05-29 DIAGNOSIS — K269 Duodenal ulcer, unspecified as acute or chronic, without hemorrhage or perforation: Secondary | ICD-10-CM | POA: Diagnosis not present

## 2022-05-29 DIAGNOSIS — F0390 Unspecified dementia without behavioral disturbance: Secondary | ICD-10-CM | POA: Diagnosis not present

## 2022-05-29 DIAGNOSIS — G8929 Other chronic pain: Secondary | ICD-10-CM | POA: Diagnosis not present

## 2022-05-29 DIAGNOSIS — N3281 Overactive bladder: Secondary | ICD-10-CM | POA: Diagnosis not present

## 2022-05-29 DIAGNOSIS — K5901 Slow transit constipation: Secondary | ICD-10-CM

## 2022-05-29 DIAGNOSIS — R269 Unspecified abnormalities of gait and mobility: Secondary | ICD-10-CM | POA: Diagnosis not present

## 2022-05-29 NOTE — Assessment & Plan Note (Signed)
on Memantine, Donepezil for memory. MMSE 20/30 09/10/19. TSH 1.11 09/05/21

## 2022-05-29 NOTE — Assessment & Plan Note (Signed)
s/p L TKR 2013,  on Norco, Tylenol 

## 2022-05-29 NOTE — Assessment & Plan Note (Signed)
uses walker for ambulation.   

## 2022-05-29 NOTE — Assessment & Plan Note (Signed)
GERD/PUD/gastric erosions, stable, on Pantoprazole,  Hgb 13.5 09/05/21 

## 2022-05-29 NOTE — Assessment & Plan Note (Signed)
managed on Myrbetriq

## 2022-05-29 NOTE — Assessment & Plan Note (Signed)
stable, on MiraLax daily.  

## 2022-05-29 NOTE — Assessment & Plan Note (Signed)
occasional

## 2022-05-29 NOTE — Progress Notes (Signed)
Location:  SNF [place] Room Number: NO/51/A Place of Service:  SNF (31) Provider: Lennie Odor Lorianne Malbrough NP  Darilyn Storbeck X, NP  Patient Care Team: Berry Godsey X, NP as PCP - General (Internal Medicine) [name] Kaevon Cotta X, NP as Nurse Practitioner (Nurse Practitioner) [name], MD as Attending Physician (Urology) [name], MD as Consulting Physician (Gastroenterology)  Extended Emergency Contact Information Primary Emergency Contact: Katt,[name] Address: [place] [address] [name] of [place] Phone: [phone] Relation: Son  Code Status:  DNR Goals of care: Advanced Directive information    05/29/2022    3:06 PM  Advanced Directives  Does Patient Have a Medical Advance Directive? Yes  Type of Paramedic of [place];Living will;Out of facility DNR (pink MOST or yellow form)  Does patient want to make changes to medical advance directive? No - Patient declined  Copy of [place] in Chart? Yes - validated most recent copy scanned in chart (See row information)  Pre-existing out of facility DNR order (yellow form or pink MOST form) Yellow form placed in chart (order not valid for inpatient use)     Chief Complaint  Patient presents with   Medical Management of Chronic Issues    Patient is here for a follow up for chronic conditions Patient is due for covid booster and annual flu vaccine    HPI:  Pt is a 88 y.o. female seen today for medical management of chronic diseases.       Dementia, on Memantine, Donepezil for memory. MMSE 20/30 09/10/19. TSH 1.11 09/05/21             Hx of OA, s/p L TKR 2013,  on Norco, Tylenol             Gait abnormalit, uses walker for ambulation.                    GERD/PUD/gastric erosions, stable, on Pantoprazole,  Hgb 13.5 09/05/21             Urinary frequency, managed on Myrbetriq              Constipation, stable, on MiraLax daily.    BPPV occasional  Past Medical History:  Diagnosis Date   Allergic rhinitis due to pollen 07/01/2009   Anemia, unspecified 09/07/2010   Arthritis    Benign paroxysmal positional vertigo    Candidiasis 01/01/2012   Cramp of limb 01/11/2011   Disorder of bone and cartilage, unspecified 11/10/2010   Dizziness and giddiness 11/10/2010   Insomnia, unspecified 09/07/2010   Knee pain, left anterior    Lumbago 07/01/2009   Lump or mass in breast 08/01/2012   Memory loss 01/22/2012   Myalgia and myositis, unspecified 05/17/2011   Myopathy, unspecified    Osteoarthrosis, unspecified whether generalized or localized, lower leg    Other abnormal glucose 03/23/2010   Other chest pain 08/28/2012   Other disorders of bone and cartilage(733.99) 09/19/2012   Overactive bladder    Rash    inner legs- ? med allergy- to see PCP if doesnt improve by end of week   Rash and nonspecific skin eruption 10/28/2014   Right bundle branch block    Seborrheic keratoses 05/17/2011   Spasm of muscle 01/11/2011   Spinal stenosis, lumbar region, without neurogenic claudication    Stress incontinence 07/08/2014   Unspecified constipation 12/18/2011   Unspecified tinnitus  Unspecified urinary incontinence    Urgency of urination    Urinary frequency 09/18/2012   Past Surgical History:  Procedure Laterality Date   ABDOMINAL HYSTERECTOMY     APPENDECTOMY  1956   BACK SURGERY     lumbar-- ? type  Dr Tonita Cong   BREAST LUMPECTOMY  1993   breast cyst   BREAST SURGERY     right lumpectomy   CLEFT LIP REPAIR     with repair palate   ESOPHAGOGASTRODUODENOSCOPY Left 03/10/2013   Procedure: ESOPHAGOGASTRODUODENOSCOPY (EGD);  Surgeon: Beryle Beams, MD;  Location: [facility];  Service: Endoscopy;  Laterality: Left;   EYE SURGERY     bilateral cataract extraction with IOL   TOTAL KNEE ARTHROPLASTY  12/13/2011   Procedure: TOTAL KNEE ARTHROPLASTY;  Surgeon: Johnn Hai, MD;  Location: [facility];  Service: Orthopedics;  Laterality:  Left;    No Known Allergies  Allergies as of 05/29/2022   No Known Allergies      Medication List        Accurate as of May 29, 2022  3:09 PM. If you have any questions, ask your nurse or doctor.          acetaminophen 500 MG tablet Commonly known as: TYLENOL Take 500 mg by mouth 2 (two) times daily.   donepezil 5 MG tablet Commonly known as: ARICEPT Take 5 mg by mouth at bedtime.   HYDROcodone-acetaminophen 5-325 MG tablet Commonly known as: NORCO/VICODIN Take 1 tablet by mouth 2 (two) times daily. For back pain   memantine 10 MG tablet Commonly known as: NAMENDA Take 10 mg by mouth 2 (two) times daily.   Myrbetriq 50 MG Tb24 tablet Generic drug: mirabegron ER Take 50 mg by mouth daily.   pantoprazole 40 MG tablet Commonly known as: Protonix Take 1 tablet (40 mg total) by mouth 2 (two) times daily.   polyethylene glycol 17 g packet Commonly known as: MIRALAX / GLYCOLAX Take 17 g by mouth daily.        Review of Systems  Constitutional:  Negative for appetite change, fatigue and fever.  HENT:  Positive for hearing loss. Negative for congestion, sinus pressure and trouble swallowing.   Eyes:  Negative for visual disturbance.  Respiratory:  Negative for cough and wheezing.   Cardiovascular:  Negative for leg swelling.  Gastrointestinal:  Negative for abdominal pain.  Genitourinary:  Positive for frequency. Negative for dysuria and urgency.  Musculoskeletal:  Positive for arthralgias, back pain and gait problem.       Left knee pain  Skin:  Negative for color change.  Neurological:  Negative for speech difficulty, weakness and headaches.       Dementia  Psychiatric/Behavioral:  Positive for confusion. Negative for behavioral problems and sleep disturbance.     Immunization History  Administered Date(s) Administered   Influenza Whole 05/27/2012, 06/10/2013, 06/06/2018   Influenza, High Dose Seasonal PF 05/30/2019   Influenza-Unspecified  06/14/2014, 05/26/2015, 06/13/2016, 06/17/2017, 06/08/2020, 06/14/2021   Moderna SARS-COV2 Booster Vaccination 01/12/2022   Moderna Sars-Covid-2 Vaccination 08/29/2019, 09/26/2019, 07/05/2020, 01/24/2021   Pfizer Covid-19 Vaccine Bivalent Booster 41yr & up 05/16/2021   Pneumococcal Conjugate-13 06/24/2017   Pneumococcal Polysaccharide-23 10/03/2011   Td 10/03/2011   Tdap 10/31/2021   Zoster Recombinat (Shingrix) 10/31/2021, 01/31/2022   Zoster, Live 08/28/2007   Pertinent  Health Maintenance Due  Topic Date Due   INFLUENZA VACCINE  03/27/2022   DEXA SCAN  Completed      01/21/2014    2:14  PM 09/15/2015    3:29 PM 05/17/2017    9:30 AM 03/27/2019    5:06 PM 05/29/2022    3:06 PM  Fall Risk  Falls in the past year? No No No  0  Number of falls in past year - Comments    Emmi Telephone Survey Actual Response =    Was there an injury with Fall?     0  Fall Risk Category Calculator     0  Fall Risk Category     Low  Patient Fall Risk Level     Low fall risk  Patient at Risk for Falls Due to     No Fall Risks  Fall risk Follow up     Falls evaluation completed   Functional Status Survey:    Vitals:   05/29/22 1507  BP: 124/70  Pulse: 76  Resp: 16  Temp: 98.4 F (36.9 C)  SpO2: 94%  Weight: 164 lb (74.4 kg)  Height: '5\' 6"'$  (1.676 m)   Body mass index is 26.47 kg/m. Physical Exam Vitals and nursing note reviewed.  Constitutional:      Appearance: Normal appearance.  HENT:     Head: Normocephalic and atraumatic.     Mouth/Throat:     Mouth: Mucous membranes are moist.  Eyes:     Extraocular Movements: Extraocular movements intact.     Conjunctiva/sclera:     Right eye: Right conjunctiva is not injected.     Left eye: Left conjunctiva is not injected.     Pupils: Pupils are equal, round, and reactive to light.  Cardiovascular:     Rate and Rhythm: Normal rate and regular rhythm.     Heart sounds: No murmur heard. Pulmonary:     Effort: Pulmonary effort is normal.      Breath sounds: No rales.  Abdominal:     General: Bowel sounds are normal.     Palpations: Abdomen is soft.     Tenderness: There is no abdominal tenderness.  Musculoskeletal:     Cervical back: Normal range of motion and neck supple.     Right lower leg: No edema.     Left lower leg: No edema.     Comments: Ambulates with walker. Left knee pain when walking.   Skin:    General: Skin is warm and dry.  Neurological:     General: No focal deficit present.     Mental Status: She is alert. Mental status is at baseline.     Gait: Gait abnormal.     Comments: Oriented to self, her room. Ambulates with walker with SBA  Psychiatric:        Mood and Affect: Mood normal.        Behavior: Behavior normal.     Labs reviewed: Recent Labs    09/05/21 0000  NA 141  K 4.1  CL 107  CO2 26*  BUN 15  CREATININE 0.7  CALCIUM 9.1   Recent Labs    09/05/21 0000  AST 11*  ALT 6*  ALKPHOS 107  ALBUMIN 3.3*   Recent Labs    09/05/21 0000  WBC 6.9  HGB 13.5  HCT 40  PLT 290   Lab Results  Component Value Date   TSH 1.11 09/05/2021   Lab Results  Component Value Date   HGBA1C 5.6 04/24/2016   Lab Results  Component Value Date   CHOL 189 09/06/2015   HDL 65 09/06/2015   LDLCALC 99 09/06/2015   TRIG  126 09/06/2015    Significant Diagnostic Results in last 30 days:  No results found.  Assessment/Plan  Benign paroxysmal positional vertigo occasional  Slow transit constipation stable, on MiraLax daily.   Overactive bladder managed on Myrbetriq   Duodenal ulcer without hemorrhage or perforation GERD/PUD/gastric erosions, stable, on Pantoprazole,  Hgb 13.5 09/05/21  Gait abnormality uses walker for ambulation.    Left knee pain s/p L TKR 2013,  on Norco, Tylenol  Dementia without behavioral disturbance ([place]) on Memantine, Donepezil for memory. MMSE 20/30 09/10/19. TSH 1.11 09/05/21   Family/ staff Communication: plan of care reviewed with the patient and  charge nurse.   Labs/tests ordered:  none  Time spend 35 minutes.

## 2022-06-01 ENCOUNTER — Encounter: Payer: Self-pay | Admitting: Nurse Practitioner

## 2022-06-28 DIAGNOSIS — Z23 Encounter for immunization: Secondary | ICD-10-CM | POA: Diagnosis not present

## 2022-06-29 ENCOUNTER — Encounter: Payer: Self-pay | Admitting: Nurse Practitioner

## 2022-06-29 ENCOUNTER — Non-Acute Institutional Stay (SKILLED_NURSING_FACILITY): Payer: Medicare Other | Admitting: Nurse Practitioner

## 2022-06-29 DIAGNOSIS — F0390 Unspecified dementia without behavioral disturbance: Secondary | ICD-10-CM

## 2022-06-29 DIAGNOSIS — K5901 Slow transit constipation: Secondary | ICD-10-CM

## 2022-06-29 DIAGNOSIS — R269 Unspecified abnormalities of gait and mobility: Secondary | ICD-10-CM | POA: Diagnosis not present

## 2022-06-29 DIAGNOSIS — K269 Duodenal ulcer, unspecified as acute or chronic, without hemorrhage or perforation: Secondary | ICD-10-CM | POA: Diagnosis not present

## 2022-06-29 DIAGNOSIS — N3281 Overactive bladder: Secondary | ICD-10-CM | POA: Diagnosis not present

## 2022-06-29 DIAGNOSIS — M159 Polyosteoarthritis, unspecified: Secondary | ICD-10-CM

## 2022-06-29 NOTE — Progress Notes (Unsigned)
Location:   SNF [place] Room Number: 34 Place of Service:  SNF (31) Provider: Lennie Odor Jevan Gaunt NP  Mashayla Lavin X, NP  Patient Care Team: Everlee Quakenbush X, NP as PCP - General (Internal Medicine) [name] Vesta Wheeland X, NP as Nurse Practitioner (Nurse Practitioner) [name], MD as Attending Physician (Urology) [name], MD as Consulting Physician (Gastroenterology)  Extended Emergency Contact Information Primary Emergency Contact: Bonillas,[name] Address: [place] [address] [name] of [place] Phone: [phone] Relation: Son  Code Status:  DNR Goals of care: Advanced Directive information    05/29/2022    3:06 PM  Advanced Directives  Does Patient Have a Medical Advance Directive? Yes  Type of Paramedic of [place];Living will;Out of facility DNR (pink MOST or yellow form)  Does patient want to make changes to medical advance directive? No - Patient declined  Copy of [place] in Chart? Yes - validated most recent copy scanned in chart (See row information)  Pre-existing out of facility DNR order (yellow form or pink MOST form) Yellow form placed in chart (order not valid for inpatient use)     Chief Complaint  Patient presents with   Medical Management of Chronic Issues    HPI:  Pt is a 88 y.o. female seen today for medical management of chronic diseases.      Dementia, on Memantine, Donepezil for memory. MMSE 20/30 09/10/19. TSH 1.11 09/05/21             Hx of OA, s/p L TKR 2013,  on Norco, Tylenol             Gait abnormalit, uses walker for ambulation.                    GERD/PUD/gastric erosions, stable, on Pantoprazole,  Hgb 13.5 09/05/21             Urinary frequency, managed on Myrbetriq              Constipation, stable, on MiraLax daily.              BPPV no recent occurrence.   Past Medical History:  Diagnosis Date   Allergic rhinitis due to pollen  07/01/2009   Anemia, unspecified 09/07/2010   Arthritis    Benign paroxysmal positional vertigo    Candidiasis 01/01/2012   Cramp of limb 01/11/2011   Disorder of bone and cartilage, unspecified 11/10/2010   Dizziness and giddiness 11/10/2010   Insomnia, unspecified 09/07/2010   Knee pain, left anterior    Lumbago 07/01/2009   Lump or mass in breast 08/01/2012   Memory loss 01/22/2012   Myalgia and myositis, unspecified 05/17/2011   Myopathy, unspecified    Osteoarthrosis, unspecified whether generalized or localized, lower leg    Other abnormal glucose 03/23/2010   Other chest pain 08/28/2012   Other disorders of bone and cartilage(733.99) 09/19/2012   Overactive bladder    Rash    inner legs- ? med allergy- to see PCP if doesnt improve by end of week   Rash and nonspecific skin eruption 10/28/2014   Right bundle branch block    Seborrheic keratoses 05/17/2011   Spasm of muscle 01/11/2011   Spinal stenosis, lumbar region, without neurogenic claudication    Stress incontinence 07/08/2014   Unspecified constipation 12/18/2011   Unspecified tinnitus    Unspecified urinary incontinence    Urgency  of urination    Urinary frequency 09/18/2012   Past Surgical History:  Procedure Laterality Date   ABDOMINAL HYSTERECTOMY     APPENDECTOMY  1956   BACK SURGERY     lumbar-- ? type  Dr Tonita Cong   BREAST LUMPECTOMY  1993   breast cyst   BREAST SURGERY     right lumpectomy   CLEFT LIP REPAIR     with repair palate   ESOPHAGOGASTRODUODENOSCOPY Left 03/10/2013   Procedure: ESOPHAGOGASTRODUODENOSCOPY (EGD);  Surgeon: Beryle Beams, MD;  Location: [facility];  Service: Endoscopy;  Laterality: Left;   EYE SURGERY     bilateral cataract extraction with IOL   TOTAL KNEE ARTHROPLASTY  12/13/2011   Procedure: TOTAL KNEE ARTHROPLASTY;  Surgeon: Johnn Hai, MD;  Location: [facility];  Service: Orthopedics;  Laterality: Left;    No Known Allergies  Allergies as of 06/29/2022   No Known Allergies       Medication List        Accurate as of June 29, 2022 11:59 PM. If you have any questions, ask your nurse or doctor.          acetaminophen 500 MG tablet Commonly known as: TYLENOL Take 500 mg by mouth 2 (two) times daily.   donepezil 5 MG tablet Commonly known as: ARICEPT Take 5 mg by mouth at bedtime.   HYDROcodone-acetaminophen 5-325 MG tablet Commonly known as: NORCO/VICODIN Take 1 tablet by mouth 2 (two) times daily. For back pain   memantine 10 MG tablet Commonly known as: NAMENDA Take 10 mg by mouth 2 (two) times daily.   Myrbetriq 50 MG Tb24 tablet Generic drug: mirabegron ER Take 50 mg by mouth daily.   pantoprazole 40 MG tablet Commonly known as: Protonix Take 1 tablet (40 mg total) by mouth 2 (two) times daily.   polyethylene glycol 17 g packet Commonly known as: MIRALAX / GLYCOLAX Take 17 g by mouth daily.        Review of Systems  Unable to perform ROS: Dementia    Immunization History  Administered Date(s) Administered   Influenza Whole 05/27/2012, 06/10/2013, 06/06/2018   Influenza, High Dose Seasonal PF 05/30/2019, 06/25/2022   Influenza-Unspecified 06/14/2014, 05/26/2015, 06/13/2016, 06/17/2017, 06/08/2020, 06/14/2021   Moderna SARS-COV2 Booster Vaccination 01/12/2022   Moderna Sars-Covid-2 Vaccination 08/29/2019, 09/26/2019, 07/05/2020, 01/24/2021   Pfizer Covid-19 Vaccine Bivalent Booster 34yr & up 05/16/2021   Pneumococcal Conjugate-13 06/24/2017   Pneumococcal Polysaccharide-23 10/03/2011   Td 10/03/2011   Tdap 10/31/2021   Zoster Recombinat (Shingrix) 10/31/2021, 01/31/2022   Zoster, Live 08/28/2007   Pertinent  Health Maintenance Due  Topic Date Due   INFLUENZA VACCINE  Completed   DEXA SCAN  Completed      01/21/2014    2:14 PM 09/15/2015    3:29 PM 05/17/2017    9:30 AM 03/27/2019    5:06 PM 05/29/2022    3:06 PM  FGlen Hopein the past year? No No No  0  Number of falls in past year - Comments    Emmi Telephone  Survey Actual Response =    Was there an injury with Fall?     0  Fall Risk Category Calculator     0  Fall Risk Category     Low  Patient Fall Risk Level     Low fall risk  Patient at Risk for Falls Due to     No Fall Risks  Fall risk Follow up  Falls evaluation completed   Functional Status Survey:    Vitals:   06/29/22 1557  BP: 118/80  Pulse: 80  Resp: 19  Temp: 97.8 F (36.6 C)  SpO2: 97%  Weight: 155 lb 12.8 oz (70.7 kg)   Body mass index is 25.15 kg/m. Physical Exam Vitals and nursing note reviewed.  Constitutional:      Appearance: Normal appearance.  HENT:     Head: Normocephalic and atraumatic.     Mouth/Throat:     Mouth: Mucous membranes are moist.  Eyes:     Extraocular Movements: Extraocular movements intact.     Conjunctiva/sclera:     Right eye: Right conjunctiva is not injected.     Left eye: Left conjunctiva is not injected.     Pupils: Pupils are equal, round, and reactive to light.  Cardiovascular:     Rate and Rhythm: Normal rate and regular rhythm.     Heart sounds: No murmur heard. Pulmonary:     Effort: Pulmonary effort is normal.     Breath sounds: No rales.  Abdominal:     General: Bowel sounds are normal.     Palpations: Abdomen is soft.     Tenderness: There is no abdominal tenderness.  Musculoskeletal:     Cervical back: Normal range of motion and neck supple.     Right lower leg: No edema.     Left lower leg: No edema.     Comments: Ambulates with walker. Left knee pain when walking.   Skin:    General: Skin is warm and dry.  Neurological:     General: No focal deficit present.     Mental Status: She is alert. Mental status is at baseline.     Gait: Gait abnormal.     Comments: Oriented to self, her room. Ambulates with walker with SBA  Psychiatric:        Mood and Affect: Mood normal.        Behavior: Behavior normal.     Labs reviewed: Recent Labs    09/05/21 0000  NA 141  K 4.1  CL 107  CO2 26*  BUN 15   CREATININE 0.7  CALCIUM 9.1   Recent Labs    09/05/21 0000  AST 11*  ALT 6*  ALKPHOS 107  ALBUMIN 3.3*   Recent Labs    09/05/21 0000  WBC 6.9  HGB 13.5  HCT 40  PLT 290   Lab Results  Component Value Date   TSH 1.11 09/05/2021   Lab Results  Component Value Date   HGBA1C 5.6 04/24/2016   Lab Results  Component Value Date   CHOL 189 09/06/2015   HDL 65 09/06/2015   LDLCALC 99 09/06/2015   TRIG 126 09/06/2015    Significant Diagnostic Results in last 30 days:  No results found.  Assessment/Plan  Dementia without behavioral disturbance (HCC)  on Memantine, Donepezil for memory. MMSE 20/30 09/10/19. TSH 1.11 09/05/21, SNF [facility] for supportive care.   Osteoarthritis, multiple sites s/p L TKR 2013,  on Norco, Tylenol  Gait abnormality uses walker for ambulation.    Duodenal ulcer without hemorrhage or perforation GERD/PUD/gastric erosions, stable, on Pantoprazole,  Hgb 13.5 09/05/21  Overactive bladder Urinary frequency, managed on Myrbetriq   Slow transit constipation stable, on MiraLax daily   Family/ staff Communication: plan of care reviewed with the patient and charge nurse.   Labs/tests ordered:  none  Time spend 35 minutes.

## 2022-06-29 NOTE — Progress Notes (Signed)
Error

## 2022-07-02 NOTE — Assessment & Plan Note (Signed)
stable, on MiraLax daily

## 2022-07-02 NOTE — Assessment & Plan Note (Signed)
uses walker for ambulation.

## 2022-07-02 NOTE — Assessment & Plan Note (Signed)
s/p L TKR 2013,  on Norco, Tylenol

## 2022-07-02 NOTE — Assessment & Plan Note (Signed)
Urinary frequency, managed on Myrbetriq

## 2022-07-02 NOTE — Assessment & Plan Note (Signed)
on Memantine, Donepezil for memory. MMSE 20/30 09/10/19. TSH 1.11 09/05/21, SNF [facility] for supportive care.

## 2022-07-02 NOTE — Assessment & Plan Note (Signed)
GERD/PUD/gastric erosions, stable, on Pantoprazole,  Hgb 13.5 09/05/21

## 2022-07-31 ENCOUNTER — Non-Acute Institutional Stay (SKILLED_NURSING_FACILITY): Payer: Medicare Other | Admitting: Family Medicine

## 2022-07-31 DIAGNOSIS — R269 Unspecified abnormalities of gait and mobility: Secondary | ICD-10-CM | POA: Diagnosis not present

## 2022-07-31 DIAGNOSIS — F0390 Unspecified dementia without behavioral disturbance: Secondary | ICD-10-CM

## 2022-07-31 DIAGNOSIS — H8110 Benign paroxysmal vertigo, unspecified ear: Secondary | ICD-10-CM | POA: Diagnosis not present

## 2022-07-31 DIAGNOSIS — I10 Essential (primary) hypertension: Secondary | ICD-10-CM

## 2022-07-31 NOTE — Progress Notes (Signed)
Provider:  Alain Honey, MD Location:      Place of Service:     PCP: Mast, Man X, NP Patient Care Team: Mast, Man X, NP as PCP - General (Internal Medicine) [name] Mast, Man X, NP as Nurse Practitioner (Nurse Practitioner) [name], MD as Attending Physician (Urology) [name], MD as Consulting Physician (Gastroenterology)  Extended Emergency Contact Information Primary Emergency Contact: Stepien,[name] Address: [place] [address] [name] of [place] Phone: [phone] Relation: Son  Code Status:  Goals of Care: Advanced Directive information    05/29/2022    3:06 PM  Advanced Directives  Does Patient Have a Medical Advance Directive? Yes  Type of Paramedic of [place];Living will;Out of facility DNR (pink MOST or yellow form)  Does patient want to make changes to medical advance directive? No - Patient declined  Copy of [place] in Chart? Yes - validated most recent copy scanned in chart (See row information)  Pre-existing out of facility DNR order (yellow form or pink MOST form) Yellow form placed in chart (order not valid for inpatient use)     This [name] today for medical management of chronic problems including dementia, and be BPV, gait abnormality, osteoarthritis. Nursing reports no new changes with the patient. I found her sitting in the day room sleeping while sitting upright.  Her most recent MMSE was 20 over 32 years ago.  She continues on donepezil and memantine for memory. She uses a walker and wheelchair for ambulation. Her dizziness is intermittent She is on pantoprazole for GERD and history of PUD. Past Medical History:  Diagnosis Date   Allergic rhinitis due to pollen 07/01/2009   Anemia, unspecified 09/07/2010   Arthritis    Benign paroxysmal positional vertigo    Candidiasis  01/01/2012   Cramp of limb 01/11/2011   Disorder of bone and cartilage, unspecified 11/10/2010   Dizziness and giddiness 11/10/2010   Insomnia, unspecified 09/07/2010   Knee pain, left anterior    Lumbago 07/01/2009   Lump or mass in breast 08/01/2012   Memory loss 01/22/2012   Myalgia and myositis, unspecified 05/17/2011   Myopathy, unspecified    Osteoarthrosis, unspecified whether generalized or localized, lower leg    Other abnormal glucose 03/23/2010   Other chest pain 08/28/2012   Other disorders of bone and cartilage(733.99) 09/19/2012   Overactive bladder    Rash    inner legs- ? med allergy- to see PCP if doesnt improve by end of week   Rash and nonspecific skin eruption 10/28/2014   Right bundle branch block    Seborrheic keratoses 05/17/2011   Spasm of muscle 01/11/2011   Spinal stenosis, lumbar region, without neurogenic claudication    Stress incontinence 07/08/2014   Unspecified constipation 12/18/2011   Unspecified tinnitus    Unspecified urinary incontinence    Urgency of urination    Urinary frequency 09/18/2012   Past Surgical History:  Procedure Laterality Date   ABDOMINAL HYSTERECTOMY     APPENDECTOMY  1956   BACK SURGERY     lumbar-- ? type  Dr Tonita Cong   BREAST LUMPECTOMY  1993   breast cyst   BREAST SURGERY     right lumpectomy   CLEFT LIP REPAIR     with repair palate   ESOPHAGOGASTRODUODENOSCOPY Left 03/10/2013   Procedure: ESOPHAGOGASTRODUODENOSCOPY (EGD);  Surgeon: Tory Emerald  Benson Norway, MD;  Location: [facility];  Service: Endoscopy;  Laterality: Left;   EYE SURGERY     bilateral cataract extraction with IOL   TOTAL KNEE ARTHROPLASTY  12/13/2011   Procedure: TOTAL KNEE ARTHROPLASTY;  Surgeon: Johnn Hai, MD;  Location: [facility];  Service: Orthopedics;  Laterality: Left;    reports that she has never smoked. She has never used smokeless tobacco. She reports that she does not drink alcohol and does not use drugs. Social History   Socioeconomic History   Marital  status: Divorced    Spouse name: Not on file   Number of children: Not on file   Years of education: Not on file   Highest education level: Not on file  Occupational History   Occupation: retired Fish farm manager  Tobacco Use   Smoking status: Never   Smokeless tobacco: Never  Substance and Sexual Activity   Alcohol use: No    Comment: socially   wine   Drug use: No   Sexual activity: Never    Birth control/protection: Post-menopausal  Other Topics Concern   Not on file  Social History Narrative   Lives at [facility] since 08/2007   Divorce    Never smoked   Alcohol -rare   Exercise none    Living Will   Social Determinants of Health   Financial Resource Strain: Not on file  Food Insecurity: Not on file  Transportation Needs: Not on file  Physical Activity: Not on file  Stress: Not on file  Social Connections: Not on file  Intimate Partner Violence: Not on file    Functional Status Survey:    No family history on file.  Health Maintenance  Topic Date Due   COVID-19 Vaccine (6 - 2023-24 season) 04/27/2022   Medicare Annual Wellness (AWV)  11/01/2022   DTaP/Tdap/Td (3 - Td or Tdap) 11/01/2031   Pneumonia Vaccine 67+ Years old  Completed   INFLUENZA VACCINE  Completed   DEXA SCAN  Completed   Zoster Vaccines- Shingrix  Completed   HPV VACCINES  Aged Out    No Known Allergies  Outpatient Encounter Medications as of 07/31/2022  Medication Sig   acetaminophen (TYLENOL) 500 MG tablet Take 500 mg by mouth 2 (two) times daily.   donepezil (ARICEPT) 5 MG tablet Take 5 mg by mouth at bedtime.   HYDROcodone-acetaminophen (NORCO/VICODIN) 5-325 MG tablet Take 1 tablet by mouth 2 (two) times daily. For back pain   memantine (NAMENDA) 10 MG tablet Take 10 mg by mouth 2 (two) times daily.   MYRBETRIQ 50 MG TB24 tablet Take 50 mg by mouth daily.    pantoprazole (PROTONIX) 40 MG tablet Take 1 tablet (40 mg total) by mouth 2 (two) times daily.   polyethylene glycol  (MIRALAX / GLYCOLAX) 17 g packet Take 17 g by mouth daily.   No facility-administered encounter medications on file as of 07/31/2022.    Review of Systems  Unable to perform ROS: Dementia    There were no vitals filed for this visit. There is no height or weight on file to calculate BMI. Physical Exam Vitals and nursing note reviewed.  Constitutional:      Appearance: Normal appearance.     Comments: Patient is somnolent today but answers simple questions  HENT:     Head: Normocephalic.  Cardiovascular:     Rate and Rhythm: Normal rate and regular rhythm.  Pulmonary:     Effort: Pulmonary effort is normal.  Breath sounds: Normal breath sounds.  Abdominal:     General: Bowel sounds are normal.     Palpations: Abdomen is soft.  Neurological:     General: No focal deficit present.     Comments: Oriented to self only  Psychiatric:        Mood and Affect: Mood normal.        Behavior: Behavior normal.     Labs reviewed: Basic Metabolic Panel: Recent Labs    09/05/21 0000  NA 141  K 4.1  CL 107  CO2 26*  BUN 15  CREATININE 0.7  CALCIUM 9.1   Liver Function Tests: Recent Labs    09/05/21 0000  AST 11*  ALT 6*  ALKPHOS 107  ALBUMIN 3.3*   No results for input(s): "LIPASE", "AMYLASE" in the last 8760 hours. No results for input(s): "AMMONIA" in the last 8760 hours. CBC: Recent Labs    09/05/21 0000  WBC 6.9  HGB 13.5  HCT 40  PLT 290   Cardiac Enzymes: No results for input(s): "CKTOTAL", "CKMB", "CKMBINDEX", "TROPONINI" in the last 8760 hours. BNP: Invalid input(s): "POCBNP" Lab Results  Component Value Date   HGBA1C 5.6 04/24/2016   Lab Results  Component Value Date   TSH 1.11 09/05/2021   Lab Results  Component Value Date   VITAMINB12 365 12/19/2017   No results found for: "FOLATE" No results found for: "IRON", "TIBC", "FERRITIN"  Imaging and Procedures obtained prior to SNF admission: NM Renal Imaging Flow W/Pharm  Result Date:  04/06/2013 *RADIOLOGY REPORT* Clinical Data:  Right-sided hydronephrosis on CT. NUCLEAR MEDICINE RENAL SCAN WITH DIURETIC ADMINISTRATION Technique:  Radionuclide angiographic and sequential renal images were obtained after intravenous injection of radiopharmaceutical. Imaging was continued during slow intravenous injection of Lasix approximately 15 minutes after the start of the examination. Radiopharmaceutical:  16 mCi Tc-1mMAG3 Comparison:  Renal ultrasound 09/23/2012.  Abdominal pelvic CT 03/10/2013. Findings: Perfusion images demonstrate early symmetric perfusion to the kidneys.  Differential renal uptake at 2-3 minutes is 50.5% on the right and 49.5% on the left. Renogram images demonstrate normal renal cortical uptake and excretion bilaterally.  There is mild to moderate dilatation of the right renal pelvis with retained contrast prior to Lasix administration.  However, this contrast washes out following Lasix. There is no significant retained contrast within the collecting system post voiding. The right renogram curve is downsloping following Lasix. Differential renal function is 50.2% right and 49.8% left.  Post Lasix T 1/2 times are 3.5 minutes on the right and 6.0 minutes on the left. IMPRESSION: 1.  Dilatation of the right renal pelvis with good washout of activity following Lasix.  No significant ureteral obstruction demonstrated. 2.  Differential renal function is nearly symmetric. Original Report Authenticated By: WRichardean Sale M.D.    Assessment/Plan  1. Benign paroxysmal positional vertigo, unspecified laterality Problem is intermittent; not on chronic medicine  2. Dementia without behavioral disturbance (HCC) Stable.  Continues with donepezil and memantine  3. Gait abnormality Sent falls ambulates primarily in wheelchair  4. Primary hypertension Not on medication for blood pressure currently be 124/70    Family/ staff Communication:   Labs/tests ordered:  SLillette Boxer  MSabra Heck MSantee[address] Fordham DriveGNew Richland NNemahaOffice [phone]

## 2022-08-31 ENCOUNTER — Non-Acute Institutional Stay (SKILLED_NURSING_FACILITY): Payer: Medicare Other | Admitting: Nurse Practitioner

## 2022-08-31 ENCOUNTER — Encounter: Payer: Self-pay | Admitting: Nurse Practitioner

## 2022-08-31 DIAGNOSIS — M159 Polyosteoarthritis, unspecified: Secondary | ICD-10-CM

## 2022-08-31 DIAGNOSIS — F0390 Unspecified dementia without behavioral disturbance: Secondary | ICD-10-CM | POA: Diagnosis not present

## 2022-08-31 DIAGNOSIS — K5901 Slow transit constipation: Secondary | ICD-10-CM

## 2022-08-31 DIAGNOSIS — K269 Duodenal ulcer, unspecified as acute or chronic, without hemorrhage or perforation: Secondary | ICD-10-CM | POA: Diagnosis not present

## 2022-08-31 DIAGNOSIS — R269 Unspecified abnormalities of gait and mobility: Secondary | ICD-10-CM | POA: Diagnosis not present

## 2022-08-31 DIAGNOSIS — N3281 Overactive bladder: Secondary | ICD-10-CM | POA: Diagnosis not present

## 2022-08-31 NOTE — Assessment & Plan Note (Signed)
GERD/PUD/gastric erosions, stable, on Pantoprazole,  Hgb 13.5 09/05/21

## 2022-08-31 NOTE — Progress Notes (Signed)
Location:   [place] Room Number: 51A Place of Service:  SNF (31) Provider:  Marlana Latus, NP  Brynlyn Dade X, NP  Patient Care Team: Maisy Newport X, NP as PCP - General (Internal Medicine) [name] Terrelle Ruffolo X, NP as Nurse Practitioner (Nurse Practitioner) [name], MD as Attending Physician (Urology) [name], MD as Consulting Physician (Gastroenterology)  Extended Emergency Contact Information Primary Emergency Contact: Hilyer,[name] Address: [place] [address] [name] of [place] Phone: [phone] Relation: Son  Code Status:  DNR Goals of care: Advanced Directive information    08/31/2022   11:34 AM  Advanced Directives  Does Patient Have a Medical Advance Directive? Yes  Type of Advance Directive Living will;Out of facility DNR (pink MOST or yellow form)  Does patient want to make changes to medical advance directive? No - Patient declined  Pre-existing out of facility DNR order (yellow form or pink MOST form) Yellow form placed in chart (order not valid for inpatient use)     Chief Complaint  Patient presents with  . Medical Management of Chronic Issues    Routine follow up visit.  . Immunizations    COVID booster due    HPI:  Pt is a 89 y.o. female seen today for medical management of chronic diseases.       Dementia, on Memantine, Donepezil for memory. MMSE 20/30 09/10/19. TSH 1.11 09/05/21             Hx of OA, s/p L TKR 2013,  on Norco, Tylenol             Gait abnormalit, uses walker for ambulation.                    GERD/PUD/gastric erosions, stable, on Pantoprazole,  Hgb 13.5 09/05/21             Urinary frequency, managed on Myrbetriq              Constipation, stable, on MiraLax daily.              BPPV no recent occurrence.   Past Medical History:  Diagnosis Date  . Allergic rhinitis due to pollen 07/01/2009  . Anemia, unspecified 09/07/2010  . Arthritis   .  Benign paroxysmal positional vertigo   . Candidiasis 01/01/2012  . Cramp of limb 01/11/2011  . Disorder of bone and cartilage, unspecified 11/10/2010  . Dizziness and giddiness 11/10/2010  . Insomnia, unspecified 09/07/2010  . Knee pain, left anterior   . Lumbago 07/01/2009  . Lump or mass in breast 08/01/2012  . Memory loss 01/22/2012  . Myalgia and myositis, unspecified 05/17/2011  . Myopathy, unspecified   . Osteoarthrosis, unspecified whether generalized or localized, lower leg   . Other abnormal glucose 03/23/2010  . Other chest pain 08/28/2012  . Other disorders of bone and cartilage(733.99) 09/19/2012  . Overactive bladder   . Rash    inner legs- ? med allergy- to see PCP if doesnt improve by end of week  . Rash and nonspecific skin eruption 10/28/2014  . Right bundle branch block   . Seborrheic keratoses 05/17/2011  . Spasm of muscle 01/11/2011  . Spinal stenosis, lumbar region, without neurogenic claudication   . Stress incontinence 07/08/2014  . Unspecified constipation 12/18/2011  . Unspecified tinnitus   . Unspecified urinary incontinence   . Urgency of urination   . Urinary  frequency 09/18/2012   Past Surgical History:  Procedure Laterality Date  . ABDOMINAL HYSTERECTOMY    . APPENDECTOMY  1956  . BACK SURGERY     lumbar-- ? type  Dr Tonita Cong  . BREAST LUMPECTOMY  1993   breast cyst  . BREAST SURGERY     right lumpectomy  . CLEFT LIP REPAIR     with repair palate  . ESOPHAGOGASTRODUODENOSCOPY Left 03/10/2013   Procedure: ESOPHAGOGASTRODUODENOSCOPY (EGD);  Surgeon: Beryle Beams, MD;  Location: [facility];  Service: Endoscopy;  Laterality: Left;  . EYE SURGERY     bilateral cataract extraction with IOL  . TOTAL KNEE ARTHROPLASTY  12/13/2011   Procedure: TOTAL KNEE ARTHROPLASTY;  Surgeon: Johnn Hai, MD;  Location: [facility];  Service: Orthopedics;  Laterality: Left;    No Known Allergies  Allergies as of 08/31/2022   No Known Allergies      Medication List         Accurate as of August 31, 2022 11:38 AM. If you have any questions, ask your nurse or doctor.          acetaminophen 500 MG tablet Commonly known as: TYLENOL Take 500 mg by mouth 2 (two) times daily.   chlorhexidine 0.12 % solution Commonly known as: PERIDEX Use as directed in the mouth or throat 2 (two) times daily.  Give 1 application by mouth two times a day related to OTHER LESIONS OF ORAL MUCOSA (K13.79)   donepezil 5 MG tablet Commonly known as: ARICEPT Take 5 mg by mouth at bedtime.   HYDROcodone-acetaminophen 5-325 MG tablet Commonly known as: NORCO/VICODIN Take 1 tablet by mouth 2 (two) times daily. For back pain   memantine 10 MG tablet Commonly known as: NAMENDA Take 10 mg by mouth 2 (two) times daily.   Myrbetriq 50 MG Tb24 tablet Generic drug: mirabegron ER Take 50 mg by mouth daily.   pantoprazole 40 MG tablet Commonly known as: Protonix Take 1 tablet (40 mg total) by mouth 2 (two) times daily.   polyethylene glycol 17 g packet Commonly known as: MIRALAX / GLYCOLAX Take 17 g by mouth daily.        Review of Systems  Unable to perform ROS: Dementia    Immunization History  Administered Date(s) Administered  . Influenza Whole 05/27/2012, 06/10/2013, 06/06/2018  . Influenza, High Dose Seasonal PF 05/30/2019, 06/25/2022  . Influenza-Unspecified 06/14/2014, 05/26/2015, 06/13/2016, 06/17/2017, 06/08/2020, 06/14/2021  . Moderna SARS-COV2 Booster Vaccination 01/12/2022, 06/28/2022  . Moderna Sars-Covid-2 Vaccination 08/29/2019, 09/26/2019, 07/05/2020, 01/24/2021  . Pension scheme manager 73yr & up 05/16/2021  . Pneumococcal Conjugate-13 06/24/2017  . Pneumococcal Polysaccharide-23 10/03/2011  . Td 10/03/2011  . Tdap 10/31/2021  . Zoster Recombinat (Shingrix) 10/31/2021, 01/31/2022  . Zoster, Live 08/28/2007   Pertinent  Health Maintenance Due  Topic Date Due  . INFLUENZA VACCINE  Completed  . DEXA SCAN  Completed       01/21/2014    2:14 PM 09/15/2015    3:29 PM 05/17/2017    9:30 AM 03/27/2019    5:06 PM 05/29/2022    3:06 PM  Fall Risk  Falls in the past year? No No No  0  Number of falls in past year - Comments    Emmi Telephone Survey Actual Response =    Was there an injury with Fall?     0  Fall Risk Category Calculator     0  Fall Risk Category     Low  Patient Fall Risk Level     Low fall risk  Patient at Risk for Falls Due to     No Fall Risks  Fall risk Follow up     Falls evaluation completed   Functional Status Survey:    Vitals:   08/31/22 1127  BP: (!) 148/60  Pulse: 71  Resp: 18  Temp: 97.6 F (36.4 C)  SpO2: 94%  Weight: 152 lb (68.9 kg)  Height: '5\' 6"'$  (1.676 m)   Body mass index is 24.53 kg/m. Physical Exam Vitals and nursing note reviewed.  Constitutional:      Appearance: Normal appearance.  HENT:     Head: Normocephalic and atraumatic.     Mouth/Throat:     Mouth: Mucous membranes are moist.  Eyes:     Extraocular Movements: Extraocular movements intact.     Conjunctiva/sclera:     Right eye: Right conjunctiva is not injected.     Left eye: Left conjunctiva is not injected.     Pupils: Pupils are equal, round, and reactive to light.  Cardiovascular:     Rate and Rhythm: Normal rate and regular rhythm.     Heart sounds: No murmur heard. Pulmonary:     Effort: Pulmonary effort is normal.     Breath sounds: No rales.  Abdominal:     General: Bowel sounds are normal.     Palpations: Abdomen is soft.     Tenderness: There is no abdominal tenderness.  Musculoskeletal:     Cervical back: Normal range of motion and neck supple.     Right lower leg: No edema.     Left lower leg: No edema.     Comments: Ambulates with walker. Left knee pain when walking.   Skin:    General: Skin is warm and dry.  Neurological:     General: No focal deficit present.     Mental Status: She is alert. Mental status is at baseline.     Gait: Gait abnormal.     Comments: Oriented to  self, her room. Ambulates with walker with SBA  Psychiatric:        Mood and Affect: Mood normal.        Behavior: Behavior normal.    Labs reviewed: Recent Labs    09/05/21 0000  NA 141  K 4.1  CL 107  CO2 26*  BUN 15  CREATININE 0.7  CALCIUM 9.1   Recent Labs    09/05/21 0000  AST 11*  ALT 6*  ALKPHOS 107  ALBUMIN 3.3*   Recent Labs    09/05/21 0000  WBC 6.9  HGB 13.5  HCT 40  PLT 290   Lab Results  Component Value Date   TSH 1.11 09/05/2021   Lab Results  Component Value Date   HGBA1C 5.6 04/24/2016   Lab Results  Component Value Date   CHOL 189 09/06/2015   HDL 65 09/06/2015   LDLCALC 99 09/06/2015   TRIG 126 09/06/2015    Significant Diagnostic Results in last 30 days:  No results found.  Assessment/Plan No problem-specific Assessment & Plan notes found for this encounter.     Family/ staff Communication: plan of care reviewed with the patient and charge nurse.   Labs/tests ordered:  none  Time spend 35 minutes.

## 2022-08-31 NOTE — Assessment & Plan Note (Signed)
stable, on MiraLax daily.

## 2022-08-31 NOTE — Assessment & Plan Note (Signed)
Hx of OA, s/p L TKR 2013,  on Norco, Tylenol

## 2022-08-31 NOTE — Assessment & Plan Note (Signed)
on Memantine, Donepezil for memory. MMSE 20/30 09/10/19. TSH 1.11 09/05/21, gradual weight loss about 7-8Ibs  in the past 3-4 months, f/u dietary, update CBC/diff, CMP/eGFR, TSH.

## 2022-08-31 NOTE — Assessment & Plan Note (Signed)
uses walker for ambulation.

## 2022-08-31 NOTE — Assessment & Plan Note (Signed)
,   managed on Myrbetriq

## 2022-09-03 ENCOUNTER — Encounter: Payer: Self-pay | Admitting: Nurse Practitioner

## 2022-09-04 DIAGNOSIS — I10 Essential (primary) hypertension: Secondary | ICD-10-CM | POA: Diagnosis not present

## 2022-09-04 LAB — CBC AND DIFFERENTIAL
HCT: 45 (ref 36–46)
Hemoglobin: 15 (ref 12.0–16.0)
Neutrophils Absolute: 4258
Platelets: 300 10*3/uL (ref 150–400)
WBC: 6.5

## 2022-09-04 LAB — HEPATIC FUNCTION PANEL
ALT: 9 U/L (ref 7–35)
AST: 12 — AB (ref 13–35)
Alkaline Phosphatase: 114 (ref 25–125)
Bilirubin, Total: 0.4

## 2022-09-04 LAB — BASIC METABOLIC PANEL
BUN: 14 (ref 4–21)
CO2: 30 — AB (ref 13–22)
Chloride: 104 (ref 99–108)
Creatinine: 0.8 (ref 0.5–1.1)
Glucose: 101
Potassium: 4.2 mEq/L (ref 3.5–5.1)
Sodium: 141 (ref 137–147)

## 2022-09-04 LAB — CBC: RBC: 5.02 (ref 3.87–5.11)

## 2022-09-04 LAB — TSH: TSH: 1.28 (ref 0.41–5.90)

## 2022-09-04 LAB — COMPREHENSIVE METABOLIC PANEL
Albumin: 3.8 (ref 3.5–5.0)
Calcium: 9.7 (ref 8.7–10.7)
Globulin: 2.8

## 2022-11-20 ENCOUNTER — Non-Acute Institutional Stay (SKILLED_NURSING_FACILITY): Payer: Medicare Other | Admitting: Family Medicine

## 2022-11-20 DIAGNOSIS — R634 Abnormal weight loss: Secondary | ICD-10-CM

## 2022-11-20 DIAGNOSIS — I10 Essential (primary) hypertension: Secondary | ICD-10-CM

## 2022-11-20 DIAGNOSIS — N3281 Overactive bladder: Secondary | ICD-10-CM | POA: Diagnosis not present

## 2022-11-20 DIAGNOSIS — F0390 Unspecified dementia without behavioral disturbance: Secondary | ICD-10-CM | POA: Diagnosis not present

## 2022-11-20 DIAGNOSIS — R269 Unspecified abnormalities of gait and mobility: Secondary | ICD-10-CM | POA: Diagnosis not present

## 2022-11-20 NOTE — Progress Notes (Signed)
Provider:  Alain Honey, MD Location:      Place of Service:     PCP: Mast, Man X, NP Patient Care Team: Mast, Man X, NP as PCP - General (Internal Medicine) [name] Mast, Man X, NP as Nurse Practitioner (Nurse Practitioner) [name], MD as Attending Physician (Urology) [name], MD as Consulting Physician (Gastroenterology)  Extended Emergency Contact Information Primary Emergency Contact: Dearmond,[name] Address: [place] [address] [name] of [place] Phone: [phone] Relation: Son  Code Status:  Goals of Care: Advanced Directive information    08/31/2022   11:34 AM  Advanced Directives  Does Patient Have a Medical Advance Directive? Yes  Type of Advance Directive Living will;Out of facility DNR (pink MOST or yellow form)  Does patient want to make changes to medical advance directive? No - Patient declined  Pre-existing out of facility DNR order (yellow form or pink MOST form) Yellow form placed in chart (order not valid for inpatient use)      No chief complaint on file.   HPI: Patient is a 89 y.o. female seen today for medical management of chronic problems which include dementia without behavioral disturbance, osteoarthritis, gait abnormality, overactive bladder, and history of duodenal ulcer. Nursing reports no new problems.  Patient has lost weight recently consistent with her progressive dementia.  She no longer uses a walker for ambulation but gets around slowly in wheelchair.  She continues to use hydrocodone for arthritis in multiple sites. She has not had symptoms of ulcer recently taking pantoprazole.  Reviewing her medicine list she continues with Myrbetriq for overactive bladder, but she wears a diaper and is unable to tell staff when she needs to be taken to the bathroom.  Past Medical History:  Diagnosis Date   Allergic rhinitis due to pollen 07/01/2009   Anemia, unspecified 09/07/2010    Arthritis    Benign paroxysmal positional vertigo    Candidiasis 01/01/2012   Cramp of limb 01/11/2011   Disorder of bone and cartilage, unspecified 11/10/2010   Dizziness and giddiness 11/10/2010   Insomnia, unspecified 09/07/2010   Knee pain, left anterior    Lumbago 07/01/2009   Lump or mass in breast 08/01/2012   Memory loss 01/22/2012   Myalgia and myositis, unspecified 05/17/2011   Myopathy, unspecified    Osteoarthrosis, unspecified whether generalized or localized, lower leg    Other abnormal glucose 03/23/2010   Other chest pain 08/28/2012   Other disorders of bone and cartilage(733.99) 09/19/2012   Overactive bladder    Rash    inner legs- ? med allergy- to see PCP if doesnt improve by end of week   Rash and nonspecific skin eruption 10/28/2014   Right bundle branch block    Seborrheic keratoses 05/17/2011   Spasm of muscle 01/11/2011   Spinal stenosis, lumbar region, without neurogenic claudication    Stress incontinence 07/08/2014   Unspecified constipation 12/18/2011   Unspecified tinnitus    Unspecified urinary incontinence    Urgency of urination    Urinary frequency 09/18/2012   Past Surgical History:  Procedure Laterality Date   ABDOMINAL HYSTERECTOMY     APPENDECTOMY  1956   BACK SURGERY     lumbar-- ? type  Dr Tonita Cong   BREAST LUMPECTOMY  1993   breast cyst   BREAST SURGERY     right lumpectomy   CLEFT LIP REPAIR     with repair palate   ESOPHAGOGASTRODUODENOSCOPY  Left 03/10/2013   Procedure: ESOPHAGOGASTRODUODENOSCOPY (EGD);  Surgeon: Beryle Beams, MD;  Location: [facility];  Service: Endoscopy;  Laterality: Left;   EYE SURGERY     bilateral cataract extraction with IOL   TOTAL KNEE ARTHROPLASTY  12/13/2011   Procedure: TOTAL KNEE ARTHROPLASTY;  Surgeon: Johnn Hai, MD;  Location: [facility];  Service: Orthopedics;  Laterality: Left;    reports that she has never smoked. She has never used smokeless tobacco. She reports that she does not drink alcohol and does not  use drugs. Social History   Socioeconomic History   Marital status: Divorced    Spouse name: Not on file   Number of children: Not on file   Years of education: Not on file   Highest education level: Not on file  Occupational History   Occupation: retired Fish farm manager  Tobacco Use   Smoking status: Never   Smokeless tobacco: Never  Substance and Sexual Activity   Alcohol use: No    Comment: socially   wine   Drug use: No   Sexual activity: Never    Birth control/protection: Post-menopausal  Other Topics Concern   Not on file  Social History Narrative   Lives at [facility] since 08/2007   Divorce    Never smoked   Alcohol -rare   Exercise none    Living Will   Social Determinants of Health   Financial Resource Strain: Not on file  Food Insecurity: Not on file  Transportation Needs: Not on file  Physical Activity: Not on file  Stress: Not on file  Social Connections: Not on file  Intimate Partner Violence: Not on file    Functional Status Survey:    No family history on file.  Health Maintenance  Topic Date Due   COVID-19 Vaccine (6 - 2023-24 season) 08/23/2022   Medicare Annual Wellness (AWV)  11/01/2022   DTaP/Tdap/Td (3 - Td or Tdap) 11/01/2031   Pneumonia Vaccine 35+ Years old  Completed   INFLUENZA VACCINE  Completed   DEXA SCAN  Completed   Zoster Vaccines- Shingrix  Completed   HPV VACCINES  Aged Out    No Known Allergies  Outpatient Encounter Medications as of 11/20/2022  Medication Sig   acetaminophen (TYLENOL) 500 MG tablet Take 500 mg by mouth 2 (two) times daily.   chlorhexidine (PERIDEX) 0.12 % solution Use as directed in the mouth or throat 2 (two) times daily.  Give 1 application by mouth two times a day related to OTHER LESIONS OF ORAL MUCOSA (K13.79)   donepezil (ARICEPT) 5 MG tablet Take 5 mg by mouth at bedtime.   HYDROcodone-acetaminophen (NORCO/VICODIN) 5-325 MG tablet Take 1 tablet by mouth 2 (two) times daily. For back  pain   memantine (NAMENDA) 10 MG tablet Take 10 mg by mouth 2 (two) times daily.   MYRBETRIQ 50 MG TB24 tablet Take 50 mg by mouth daily.    pantoprazole (PROTONIX) 40 MG tablet Take 1 tablet (40 mg total) by mouth 2 (two) times daily.   polyethylene glycol (MIRALAX / GLYCOLAX) 17 g packet Take 17 g by mouth daily.   No facility-administered encounter medications on file as of 11/20/2022.    Review of Systems  Unable to perform ROS: Dementia    There were no vitals filed for this visit. There is no height or weight on file to calculate BMI. Physical Exam Vitals and nursing note reviewed.  Constitutional:      Appearance: Normal appearance.  HENT:  Mouth/Throat:     Mouth: Mucous membranes are moist.     Pharynx: Oropharynx is clear.  Eyes:     Conjunctiva/sclera: Conjunctivae normal.     Pupils: Pupils are equal, round, and reactive to light.  Cardiovascular:     Rate and Rhythm: Normal rate and regular rhythm.  Pulmonary:     Effort: Pulmonary effort is normal.     Breath sounds: Normal breath sounds.  Abdominal:     General: Bowel sounds are normal.     Palpations: Abdomen is soft.  Musculoskeletal:     Comments: No longer ambulatory.  Gets around in wheelchair  Neurological:     Mental Status: She is alert.     Comments: Oriented to self only  Psychiatric:        Mood and Affect: Mood normal.        Behavior: Behavior normal.     Labs reviewed: Basic Metabolic Panel: No results for input(s): "NA", "K", "CL", "CO2", "GLUCOSE", "BUN", "CREATININE", "CALCIUM", "MG", "PHOS" in the last 8760 hours. Liver Function Tests: No results for input(s): "AST", "ALT", "ALKPHOS", "BILITOT", "PROT", "ALBUMIN" in the last 8760 hours. No results for input(s): "LIPASE", "AMYLASE" in the last 8760 hours. No results for input(s): "AMMONIA" in the last 8760 hours. CBC: No results for input(s): "WBC", "NEUTROABS", "HGB", "HCT", "MCV", "PLT" in the last 8760 hours. Cardiac  Enzymes: No results for input(s): "CKTOTAL", "CKMB", "CKMBINDEX", "TROPONINI" in the last 8760 hours. BNP: Invalid input(s): "POCBNP" Lab Results  Component Value Date   HGBA1C 5.6 04/24/2016   Lab Results  Component Value Date   TSH 1.11 09/05/2021   Lab Results  Component Value Date   VITAMINB12 365 12/19/2017   No results found for: "FOLATE" No results found for: "IRON", "TIBC", "FERRITIN"  Imaging and Procedures obtained prior to SNF admission: NM Renal Imaging Flow W/Pharm  Result Date: 04/06/2013 *RADIOLOGY REPORT* Clinical Data:  Right-sided hydronephrosis on CT. NUCLEAR MEDICINE RENAL SCAN WITH DIURETIC ADMINISTRATION Technique:  Radionuclide angiographic and sequential renal images were obtained after intravenous injection of radiopharmaceutical. Imaging was continued during slow intravenous injection of Lasix approximately 15 minutes after the start of the examination. Radiopharmaceutical:  16 mCi Tc-22m MAG3 Comparison:  Renal ultrasound 09/23/2012.  Abdominal pelvic CT 03/10/2013. Findings: Perfusion images demonstrate early symmetric perfusion to the kidneys.  Differential renal uptake at 2-3 minutes is 50.5% on the right and 49.5% on the left. Renogram images demonstrate normal renal cortical uptake and excretion bilaterally.  There is mild to moderate dilatation of the right renal pelvis with retained contrast prior to Lasix administration.  However, this contrast washes out following Lasix. There is no significant retained contrast within the collecting system post voiding. The right renogram curve is downsloping following Lasix. Differential renal function is 50.2% right and 49.8% left.  Post Lasix T 1/2 times are 3.5 minutes on the right and 6.0 minutes on the left. IMPRESSION: 1.  Dilatation of the right renal pelvis with good washout of activity following Lasix.  No significant ureteral obstruction demonstrated. 2.  Differential renal function is nearly symmetric. Original  Report Authenticated By: Richardean Sale, M.D.    Assessment/Plan 1. Dementia without behavioral disturbance ([place]) Her dementia is advanced.  She had just come from breakfast when I encountered her this morning and ask her did she have breakfast yet and she replied no.  She is unable to ambulate has been losing weight.  I do not think the donepezil is adding anything to her quality  of life so we will discontinue  2. Gait abnormality Continue to use wheelchair to move about the facility  3. Primary hypertension Longer on antihypertensive and blood pressures are okay  4. Overactive bladder Since patient wears a diaper.  Will discontinue Myrbetriq again as it does not contribute to her quality of life  5. Weight loss Likely related to her dementia.  This is probably not reversible and will continue    Family/ staff Communication:   Labs/tests ordered:  .smmsig

## 2022-11-30 ENCOUNTER — Non-Acute Institutional Stay (SKILLED_NURSING_FACILITY): Payer: Medicare Other | Admitting: Nurse Practitioner

## 2022-11-30 ENCOUNTER — Encounter: Payer: Self-pay | Admitting: Nurse Practitioner

## 2022-11-30 DIAGNOSIS — K269 Duodenal ulcer, unspecified as acute or chronic, without hemorrhage or perforation: Secondary | ICD-10-CM | POA: Diagnosis not present

## 2022-11-30 DIAGNOSIS — K5901 Slow transit constipation: Secondary | ICD-10-CM

## 2022-11-30 DIAGNOSIS — M159 Polyosteoarthritis, unspecified: Secondary | ICD-10-CM | POA: Diagnosis not present

## 2022-11-30 DIAGNOSIS — N3281 Overactive bladder: Secondary | ICD-10-CM

## 2022-11-30 DIAGNOSIS — F0390 Unspecified dementia without behavioral disturbance: Secondary | ICD-10-CM

## 2022-11-30 NOTE — Assessment & Plan Note (Signed)
managed on Myrbetriq  

## 2022-11-30 NOTE — Assessment & Plan Note (Signed)
GERD/PUD/gastric erosions, stable, on Pantoprazole,  Hgb 15 09/04/22

## 2022-11-30 NOTE — Assessment & Plan Note (Signed)
stable, on MiraLax daily.  

## 2022-11-30 NOTE — Assessment & Plan Note (Signed)
on Memantine, Donepezil for memory. MMSE 20/30 09/10/19. TSH 1.28 09/04/22

## 2022-11-30 NOTE — Progress Notes (Signed)
Location:   [name] Room Number: 51-A Place of Service:  SNF (31) Provider:  Sloan Takagi, NP    Patient Care Team: Elianny Buxbaum X, NP as PCP - General (Internal Medicine) [name] Rakesh Dutko X, NP as Nurse Practitioner (Nurse Practitioner) Crist FatHerrick, Benjamin W, MD as Attending Physician (Urology) Jeani HawkingHung, Patrick, MD as Consulting Physician (Gastroenterology)  Extended Emergency Contact Information Primary Emergency Contact: Mauch,[name] Address: [address]          [place] [id number] [place] Home Phone: [phone] Relation: Son  Code Status:  DNR Goals of care: Advanced Directive information    11/30/2022   11:31 AM  Advanced Directives  Does Patient Have a Medical Advance Directive? Yes  Type of Advance Directive Living will;Out of facility DNR (pink MOST or yellow form)  Does patient want to make changes to medical advance directive? No - Patient declined     Chief Complaint  Patient presents with   Medical Management of Chronic Issues    Routine Visit.   Immunizations    Discuss the need for [employer] Special[employer]sCovid Booster.    Health Maintenance    Discuss the need for Annual Wellness Visit.    HPI:  Pt is a 89 y.o. female seen today for medical management of chronic diseases.     Dementia, on Memantine, Donepezil for memory. MMSE 20/30 09/10/19. TSH 1.28 09/04/22             Hx of OA, s/p L TKR 2013,  on Norco, Tylenol             Gait abnormalit, uses walker for ambulation.                    GERD/PUD/gastric erosions, stable, on Pantoprazole,  Hgb 15 09/04/22             Urinary frequency, managed on Myrbetriq              Constipation, stable, on MiraLax daily.              BPPV no recent occurrence.     Past Medical History:  Diagnosis Date   Allergic rhinitis due to pollen 07/01/2009   Anemia, unspecified 09/07/2010   Arthritis    Benign paroxysmal positional vertigo    Candidiasis 01/01/2012   Cramp of limb 01/11/2011    Disorder of bone and cartilage, unspecified 11/10/2010   Dizziness and giddiness 11/10/2010   Insomnia, unspecified 09/07/2010   Knee pain, left anterior    Lumbago 07/01/2009   Lump or mass in breast 08/01/2012   Memory loss 01/22/2012   Myalgia and myositis, unspecified 05/17/2011   Myopathy, unspecified    Osteoarthrosis, unspecified whether generalized or localized, lower leg    Other abnormal glucose 03/23/2010   Other chest pain 08/28/2012   Other disorders of bone and cartilage(733.99) 09/19/2012   Overactive bladder    Rash    inner legs- ? med allergy- to see PCP if doesnt improve by end of week   Rash and nonspecific skin eruption 10/28/2014   Right bundle branch block    Seborrheic keratoses 05/17/2011   Spasm of muscle 01/11/2011   Spinal stenosis, lumbar region, without neurogenic claudication    Stress incontinence 07/08/2014   Unspecified constipation 12/18/2011   Unspecified tinnitus    Unspecified urinary incontinence    Urgency of urination    Urinary frequency 09/18/2012   Past Surgical History:  Procedure Laterality  Date   ABDOMINAL HYSTERECTOMY     APPENDECTOMY  1956   BACK SURGERY     lumbar-- ? type  Dr Shelle Iron   BREAST LUMPECTOMY  1993   breast cyst   BREAST SURGERY     right lumpectomy   CLEFT LIP REPAIR     with repair palate   ESOPHAGOGASTRODUODENOSCOPY Left 03/10/2013   Procedure: ESOPHAGOGASTRODUODENOSCOPY (EGD);  Surgeon: Theda Belfast, MD;  Location: [facility];  Service: Endoscopy;  Laterality: Left;   EYE SURGERY     bilateral cataract extraction with IOL   TOTAL KNEE ARTHROPLASTY  12/13/2011   Procedure: TOTAL KNEE ARTHROPLASTY;  Surgeon: Javier Docker, MD;  Location: [facility];  Service: Orthopedics;  Laterality: Left;    No Known Allergies  Allergies as of 11/30/2022   No Known Allergies      Medication List        Accurate as of November 30, 2022  1:40 PM. If you have any questions, ask your nurse or doctor.          STOP taking these  medications    donepezil 5 MG tablet Commonly known as: ARICEPT Stopped by: Chidiebere Wynn X Frisco Cordts, NP   Myrbetriq 50 MG Tb24 tablet Generic drug: mirabegron ER Stopped by: Antonie Borjon X Raechel Marcos, NP       TAKE these medications    acetaminophen 500 MG tablet Commonly known as: TYLENOL Take 500 mg by mouth 2 (two) times daily.   chlorhexidine 0.12 % solution Commonly known as: PERIDEX Use as directed in the mouth or throat 2 (two) times daily.  Give 1 application by mouth two times a day related to OTHER LESIONS OF ORAL MUCOSA (K13.79)   HYDROcodone-acetaminophen 5-325 MG tablet Commonly known as: NORCO/VICODIN Take 1 tablet by mouth 2 (two) times daily. For back pain   memantine 10 MG tablet Commonly known as: NAMENDA Take 10 mg by mouth 2 (two) times daily.   pantoprazole 40 MG tablet Commonly known as: Protonix Take 1 tablet (40 mg total) by mouth 2 (two) times daily.   polyethylene glycol 17 g packet Commonly known as: MIRALAX / GLYCOLAX Take 17 g by mouth daily.   sodium fluoride 1.1 % Gel dental gel Commonly known as: FLUORISHIELD Place 1 Application onto teeth at bedtime.        Review of Systems  Unable to perform ROS: Dementia    Immunization History  Administered Date(s) Administered   Influenza Whole 05/27/2012, 06/10/2013, 06/06/2018   Influenza, High Dose Seasonal PF 05/30/2019, 06/25/2022   Influenza-Unspecified 06/14/2014, 05/26/2015, 06/13/2016, 06/17/2017, 06/08/2020, 06/14/2021   Moderna SARS-COV2 Booster Vaccination 01/12/2022, 06/28/2022   Moderna Sars-Covid-2 Vaccination 08/29/2019, 09/26/2019, 07/05/2020, 01/24/2021   Pfizer Covid-19 Vaccine Bivalent Booster 53yrs & up 05/16/2021   Pneumococcal Conjugate-13 06/24/2017   Pneumococcal Polysaccharide-23 10/03/2011   Td 10/03/2011   Tdap 10/31/2021   Zoster Recombinat (Shingrix) 10/31/2021, 01/31/2022   Zoster, Live 08/28/2007   Pertinent  Health Maintenance Due  Topic Date Due   INFLUENZA VACCINE   03/28/2023   DEXA SCAN  Completed      01/21/2014    2:14 PM 09/15/2015    3:29 PM 05/17/2017    9:30 AM 03/27/2019    5:06 PM 05/29/2022    3:06 PM  Fall Risk  Falls in the past year? No No No  0  Number of falls in past year - Comments    Emmi Telephone Survey Actual Response =    Was there an injury  with Fall?     0  Fall Risk Category Calculator     0  Fall Risk Category (Retired)     Low  (RETIRED) Patient Fall Risk Level     Low fall risk  Patient at Risk for Falls Due to     No Fall Risks  Fall risk Follow up     Falls evaluation completed   Functional Status Survey:    Vitals:   11/30/22 1123  BP: 120/62  Pulse: 64  Resp: 18  Temp: (!) 97.4 F (36.3 C)  SpO2: 95%  Weight: 156 lb 11.2 oz (71.1 kg)  Height: 5\' 6"  (1.676 m)   Body mass index is 25.29 kg/m. Physical Exam Vitals and nursing note reviewed.  Constitutional:      Appearance: Normal appearance.  HENT:     Head: Normocephalic and atraumatic.     Mouth/Throat:     Mouth: Mucous membranes are moist.  Eyes:     Extraocular Movements: Extraocular movements intact.     Conjunctiva/sclera:     Right eye: Right conjunctiva is not injected.     Left eye: Left conjunctiva is not injected.     Pupils: Pupils are equal, round, and reactive to light.  Cardiovascular:     Rate and Rhythm: Normal rate and regular rhythm.     Heart sounds: No murmur heard. Pulmonary:     Effort: Pulmonary effort is normal.     Breath sounds: No rales.  Abdominal:     General: Bowel sounds are normal.     Palpations: Abdomen is soft.     Tenderness: There is no abdominal tenderness.  Musculoskeletal:     Cervical back: Normal range of motion and neck supple.     Right lower leg: No edema.     Left lower leg: No edema.     Comments: Ambulates with walker. Left knee pain when walking.   Skin:    General: Skin is warm and dry.  Neurological:     General: No focal deficit present.     Mental Status: She is alert. Mental  status is at baseline.     Gait: Gait abnormal.     Comments: Oriented to self, her room. Ambulates with walker with SBA  Psychiatric:        Mood and Affect: Mood normal.        Behavior: Behavior normal.     Labs reviewed: Recent Labs    09/04/22 0000  NA 141  K 4.2  CL 104  CO2 30*  BUN 14  CREATININE 0.8  CALCIUM 9.7   Recent Labs    09/04/22 0000  AST 12*  ALT 9  ALKPHOS 114  ALBUMIN 3.8   Recent Labs    09/04/22 0000  WBC 6.5  NEUTROABS 4,258.00  HGB 15.0  HCT 45  PLT 300   Lab Results  Component Value Date   TSH 1.28 09/04/2022   Lab Results  Component Value Date   HGBA1C 5.6 04/24/2016   Lab Results  Component Value Date   CHOL 189 09/06/2015   HDL 65 09/06/2015   LDLCALC 99 09/06/2015   TRIG 126 09/06/2015    Significant Diagnostic Results in last 30 days:  No results found.  Assessment/Plan Duodenal ulcer without hemorrhage or perforation  GERD/PUD/gastric erosions, stable, on Pantoprazole,  Hgb 15 09/04/22  Overactive bladder managed on Myrbetriq   Slow transit constipation stable, on MiraLax daily.   Osteoarthritis, multiple sites  Hx  of OA, s/p L TKR 2013,  on Norco, Tylenol     Family/ staff Communication: plan of care reviewed with the patient and charge nurse.   Labs/tests ordered:  none  Time spend 35 minutes.

## 2022-11-30 NOTE — Assessment & Plan Note (Signed)
Hx of OA, s/p L TKR 2013,  on Norco, Tylenol 

## 2022-12-04 ENCOUNTER — Encounter: Payer: Self-pay | Admitting: Nurse Practitioner

## 2022-12-04 ENCOUNTER — Non-Acute Institutional Stay (INDEPENDENT_AMBULATORY_CARE_PROVIDER_SITE_OTHER): Payer: Medicare Other | Admitting: Nurse Practitioner

## 2022-12-04 DIAGNOSIS — Z0000 Encounter for general adult medical examination without abnormal findings: Secondary | ICD-10-CM

## 2022-12-04 NOTE — Progress Notes (Signed)
Subjective:   Janet Hernandez is a 89 y.o. female who presents for Medicare Annual (Subsequent) preventive examination @ [facility] [address].  Cardiac Risk Factors include: advanced age (>18men, >10 women);sedentary lifestyle     Objective:    Today's Vitals   12/04/22 0856 12/04/22 1156  BP: (!) 100/58   Pulse: 78   Resp: 18   Temp: (!) 97.4 F (36.3 C)   SpO2: 95%   Weight: 156 lb 11.2 oz (71.1 kg)   Height: 5\' 6"  (1.676 m)   PainSc:  2    Body mass index is 25.29 kg/m.     12/04/2022    9:01 AM 11/30/2022   11:31 AM 08/31/2022   11:34 AM 05/29/2022    3:06 PM 05/01/2022   11:21 AM 04/05/2022   10:59 AM 03/07/2022    1:56 PM  Advanced Directives  Does Patient Have a Medical Advance Directive? Yes Yes Yes Yes Yes Yes Yes  Type of Estate agent of [place];Living will;Out of facility DNR (pink MOST or yellow form) Living will;Out of facility DNR (pink MOST or yellow form) Living will;Out of facility DNR (pink MOST or yellow form) Healthcare Power of [place];Living will;Out of facility DNR (pink MOST or yellow form) Healthcare Power of [place];Living will;Out of facility DNR (pink MOST or yellow form) Healthcare Power of [place];Living will;Out of facility DNR (pink MOST or yellow form) Healthcare Power of [place];Living will;Out of facility DNR (pink MOST or yellow form)  Does patient want to make changes to medical advance directive? No - Patient declined No - Patient declined No - Patient declined No - Patient declined No - Patient declined No - Patient declined No - Patient declined  Copy of Healthcare Power of Attorney in Chart? Yes - validated most recent copy scanned in chart (See row information)   Yes - validated most recent copy scanned in chart (See row information) Yes - validated most recent copy scanned in chart (See row information) Yes - validated most recent copy scanned in chart (See row information) Yes - validated most recent copy  scanned in chart (See row information)  Pre-existing out of facility DNR order (yellow form or pink MOST form) Yellow form placed in chart (order not valid for inpatient use)  Yellow form placed in chart (order not valid for inpatient use) Yellow form placed in chart (order not valid for inpatient use) Yellow form placed in chart (order not valid for inpatient use) Yellow form placed in chart (order not valid for inpatient use) Yellow form placed in chart (order not valid for inpatient use)    Current Medications (verified) Outpatient Encounter Medications as of 12/04/2022  Medication Sig   acetaminophen (TYLENOL) 500 MG tablet Take 500 mg by mouth 2 (two) times daily.   chlorhexidine (PERIDEX) 0.12 % solution Use as directed in the mouth or throat 2 (two) times daily.  Give 1 application by mouth two times a day related to OTHER LESIONS OF [place] (K13.79)   HYDROcodone-acetaminophen (NORCO/VICODIN) 5-325 MG tablet Take 1 tablet by mouth 2 (two) times daily. For back pain   memantine (NAMENDA) 10 MG tablet Take 10 mg by mouth 2 (two) times daily.   pantoprazole (PROTONIX) 40 MG tablet Take 1 tablet (40 mg total) by mouth 2 (two) times daily.   polyethylene glycol (MIRALAX / GLYCOLAX) 17 g packet Take 17 g by mouth daily.   sodium fluoride (FLUORISHIELD) 1.1 % GEL dental gel Place 1 Application onto teeth  at bedtime.   No facility-administered encounter medications on file as of 12/04/2022.    Allergies (verified) Patient has no known allergies.   History: Past Medical History:  Diagnosis Date   Allergic rhinitis due to pollen 07/01/2009   Anemia, unspecified 09/07/2010   Arthritis    Benign paroxysmal positional vertigo    Candidiasis 01/01/2012   Cramp of limb 01/11/2011   Disorder of bone and cartilage, unspecified 11/10/2010   Dizziness and giddiness 11/10/2010   Insomnia, unspecified 09/07/2010   Knee pain, left anterior    Lumbago 07/01/2009   Lump or mass in breast 08/01/2012    Memory loss 01/22/2012   Myalgia and myositis, unspecified 05/17/2011   Myopathy, unspecified    Osteoarthrosis, unspecified whether generalized or localized, lower leg    Other abnormal glucose 03/23/2010   Other chest pain 08/28/2012   Other disorders of bone and cartilage(733.99) 09/19/2012   Overactive bladder    Rash    inner legs- ? med allergy- to see PCP if doesnt improve by end of week   Rash and nonspecific skin eruption 10/28/2014   Right bundle branch block    Seborrheic keratoses 05/17/2011   Spasm of muscle 01/11/2011   Spinal stenosis, lumbar region, without neurogenic claudication    Stress incontinence 07/08/2014   Unspecified constipation 12/18/2011   Unspecified tinnitus    Unspecified urinary incontinence    Urgency of urination    Urinary frequency 09/18/2012   Past Surgical History:  Procedure Laterality Date   ABDOMINAL HYSTERECTOMY     APPENDECTOMY  1956   BACK SURGERY     lumbar-- ? type  Dr Shelle Iron   BREAST LUMPECTOMY  1993   breast cyst   BREAST SURGERY     right lumpectomy   CLEFT LIP REPAIR     with repair palate   ESOPHAGOGASTRODUODENOSCOPY Left 03/10/2013   Procedure: ESOPHAGOGASTRODUODENOSCOPY (EGD);  Surgeon: Theda Belfast, MD;  Location: [facility];  Service: Endoscopy;  Laterality: Left;   EYE SURGERY     bilateral cataract extraction with IOL   TOTAL KNEE ARTHROPLASTY  12/13/2011   Procedure: TOTAL KNEE ARTHROPLASTY;  Surgeon: Javier Docker, MD;  Location: [facility];  Service: Orthopedics;  Laterality: Left;   History reviewed. No pertinent family history. Social History   Socioeconomic History   Marital status: Divorced    Spouse name: Not on file   Number of children: Not on file   Years of education: Not on file   Highest education level: Not on file  Occupational History   Occupation: retired Buyer, retail  Tobacco Use   Smoking status: Never   Smokeless tobacco: Never  Substance and Sexual Activity   Alcohol use: No    Comment:  socially   wine   Drug use: No   Sexual activity: Never    Birth control/protection: Post-menopausal  Other Topics Concern   Not on file  Social History Narrative   Lives at [facility] [name] since 08/2007   Divorce    Never smoked   Alcohol -rare   Exercise none    Living Will   Social Determinants of Health   Financial Resource Strain: Not on file  Food Insecurity: Not on file  Transportation Needs: Not on file  Physical Activity: Not on file  Stress: Not on file  Social Connections: Not on file    Tobacco Counseling Counseling given: Not Answered   Clinical Intake:  Pre-visit preparation completed: Yes  Pain : 0-10 Pain  Score: 2  Pain Type: Chronic pain Pain Location: Knee Pain Orientation: Right, Left Pain Descriptors / Indicators: Aching Pain Onset: More than a month ago Pain Frequency: Intermittent Pain Relieving Factors: occasionally knee pain during transfer Effect of Pain on Daily Activities: none  Pain Relieving Factors: occasionally knee pain during transfer  BMI - recorded: 25.29 Nutritional Status: BMI 25 -29 Overweight Nutritional Risks: None Diabetes: No  How often do you need to have someone help you when you read instructions, pamphlets, or other written materials from your doctor or pharmacy?: 5 - Always What is the last grade level you completed in school?: high school  Diabetic?no  Interpreter Needed?: No  Information entered by :: Ladislav Caselli Nedra Hai NP   Activities of Daily Living    12/04/2022   12:01 PM  In your present state of health, do you have any difficulty performing the following activities:  Hearing? 1  Vision? 0  Difficulty concentrating or making decisions? 1  Walking or climbing stairs? 1  Dressing or bathing? 1  Doing errands, shopping? 1  Preparing Food and eating ? N  Using the Toilet? Y  In the past six months, have you accidently leaked urine? Y  Do you have problems with loss of bowel control? Y  Managing  your Medications? Y  Managing your Finances? Y  Housekeeping or managing your Housekeeping? Y    Patient Care Team: Jamiee Milholland X, NP as PCP - General (Internal Medicine) [name] Yatzary Merriweather X, NP as Nurse Practitioner (Nurse Practitioner) [name], MD as Attending Physician (Urology) [name], MD as Consulting Physician (Gastroenterology)  Indicate any recent Medical Services you may have received from other than [facility] providers in the past year (date may be approximate).     Assessment:   This is a routine wellness examination for [place].  Hearing/Vision screen No results found.  Dietary issues and exercise activities discussed: Current Exercise Habits: The patient does not participate in regular exercise at present, Exercise limited by: neurologic condition(s);orthopedic condition(s);psychological condition(s)   Goals Addressed             This Visit's Progress    Maintain Mobility and Function       Evidence-based guidance:  Acknowledge and validate impact of pain, loss of strength and potential disfigurement (hand osteoarthritis) on mental health and daily life, such as social isolation, anxiety, depression, impaired sexual relationship and   injury from falls.  Anticipate referral to physical or occupational therapy for assessment, therapeutic exercise and recommendation for adaptive equipment or assistive devices; encourage participation.  Assess impact on ability to perform activities of daily living, as well as engage in sports and leisure events or requirements of work or school.  Provide anticipatory guidance and reassurance about the benefit of exercise to maintain function; acknowledge and normalize fear that exercise may worsen symptoms.  Encourage regular exercise, at least 10 minutes at a time for 45 minutes per week; consider yoga, water exercise and proprioceptive exercises; encourage use of wearable activity tracker to increase  motivation and adherence.  Encourage maintenance or resumption of daily activities, including employment, as pain allows and with minimal exposure to trauma.  Assist patient to advocate for adaptations to the work environment.  Consider level of pain and function, gender, age, lifestyle, patient preference, quality of life, readiness and ?ocapacity to benefit? when recommending patients for orthopaedic surgery consultation.  Explore strategies, such as changes to medication regimen or activity that enables patient to anticipate and  manage flare-ups that increase deconditioning and disability.  Explore patient preferences; encourage exposure to a broader range of activities that have been avoided for fear of experiencing pain.  Identify barriers to participation in therapy or exercise, such as pain with activity, anticipated or imagined pain.  Monitor postoperative joint replacement or any preexisting joint replacement for ongoing pain and loss of function; provide social support and encouragement throughout recovery.   Notes:        Depression Screen    05/17/2017    9:30 AM 09/15/2015    3:29 PM 01/21/2014    2:14 PM  PHQ 2/9 Scores  PHQ - 2 Score 0 0 0    Fall Risk    05/29/2022    3:06 PM 03/27/2019    5:06 PM 05/17/2017    9:30 AM 09/15/2015    3:29 PM 01/21/2014    2:14 PM  Fall Risk   Falls in the past year? 0  No No No  Comment  Emmi Telephone Survey: data to providers prior to load     Number falls in past yr: 0      Comment  Emmi Telephone Survey Actual Response =      Injury with Fall? 0      Risk for fall due to : No Fall Risks      Follow up Falls evaluation completed        FALL RISK PREVENTION PERTAINING TO THE HOME:  Any stairs in or around the home? Yes  If so, are there any without handrails? No  Home free of loose throw rugs in walkways, pet beds, electrical cords, etc? Yes  Adequate lighting in your home to reduce risk of falls? Yes   ASSISTIVE DEVICES  UTILIZED TO PREVENT FALLS:  Life alert? No  Use of a cane, walker or w/c? Yes  Grab bars in the bathroom? Yes  Shower chair or bench in shower? Yes  Elevated toilet seat or a handicapped toilet? Yes   TIMED UP AND GO:  Was the test performed? No . No longer up and go, total w/c dependent    Cognitive Function:    05/17/2017    9:38 AM 09/15/2015    4:29 PM 07/08/2014    2:57 PM  MMSE - Mini Mental State Exam  Orientation to time 3 4 5   Orientation to Place 5 5 5   Registration 3 3 3   Attention/ Calculation 5 5 5   Recall 0 0 1  Language- name 2 objects 2 2 2   Language- repeat 1 1 1   Language- follow 3 step command 3 2 3   Language- read & follow direction 1 1 1   Write a sentence 1 1 1   Copy design 1 1 1   Total score 25 25 28         Immunizations Immunization History  Administered Date(s) Administered   Influenza Whole 05/27/2012, 06/10/2013, 06/06/2018   Influenza, High Dose Seasonal PF 05/30/2019, 06/25/2022   Influenza-Unspecified 06/14/2014, 05/26/2015, 06/13/2016, 06/17/2017, 06/08/2020, 06/14/2021   Moderna SARS-COV2 Booster Vaccination 01/12/2022, 06/28/2022   Moderna Sars-Covid-2 Vaccination 08/29/2019, 09/26/2019, 07/05/2020, 01/24/2021   Pfizer Covid-19 Vaccine Bivalent Booster 9yrs & up 05/16/2021   Pneumococcal Conjugate-13 06/24/2017   Pneumococcal Polysaccharide-23 10/03/2011   Td 10/03/2011   Tdap 10/31/2021   Zoster Recombinat (Shingrix) 10/31/2021, 01/31/2022   Zoster, Live 08/28/2007    TDAP status: Up to date  Flu Vaccine status: Up to date  Pneumococcal vaccine status: Up to date  Covid-19 vaccine status: Completed  vaccines  Qualifies for Shingles Vaccine? Yes   Zostavax completed Yes   Shingrix Completed?: Yes  Screening Tests Health Maintenance  Topic Date Due   COVID-19 Vaccine (6 - 2023-24 season) 08/23/2022   INFLUENZA VACCINE  03/28/2023   Medicare Annual Wellness (AWV)  12/04/2023   DTaP/Tdap/Td (3 - Td or Tdap) 11/01/2031    Pneumonia Vaccine 8865+ Years old  Completed   DEXA SCAN  Completed   Zoster Vaccines- Shingrix  Completed   HPV VACCINES  Aged Out    Health Maintenance  Health Maintenance Due  Topic Date Due   COVID-19 Vaccine (6 - 2023-24 season) 08/23/2022    Colorectal cancer screening: No longer required.   Mammogram status: No longer required due to age.  Bone Density status: Completed 10/10/99. Results reflect: Bone density results: NORMAL. Repeat every 3 years.  Lung Cancer Screening: (Low Dose CT Chest recommended if Age 79-80 years, 30 pack-year currently smoking OR have quit w/in 15years.) does not qualify.    Additional Screening:  Hepatitis C Screening: does not qualify; Completed   Vision Screening: Recommended annual ophthalmology exams for early detection of glaucoma and other disorders of the eye. Is the patient up to date with their annual eye exam?  No  Who is the provider or what is the name of the office in which the patient attends annual eye exams? [facility] will provide If pt is not established with a provider, would they like to be referred to a provider to establish care? No .   Dental Screening: Recommended annual dental exams for proper oral hygiene  Community Resource Referral / Chronic Care Management: CRR required this visit?  No   CCM required this visit?  No      Plan:     I have personally reviewed and noted the following in the patient's chart:   Medical and social history Use of alcohol, tobacco or illicit drugs  Current medications and supplements including opioid prescriptions. Patient is currently taking opioid prescriptions. Information provided to patient regarding non-opioid alternatives. Patient advised to discuss non-opioid treatment plan with their provider. Functional ability and status Nutritional status Physical activity Advanced directives List of other physicians Hospitalizations, surgeries, and ER visits in previous 12  months Vitals Screenings to include cognitive, depression, and falls Referrals and appointments  In addition, I have reviewed and discussed with patient certain preventive protocols, quality metrics, and best practice recommendations. A written personalized care plan for preventive services as well as general preventive health recommendations were provided to patient.     Jarrod Bodkins X Hoover Grewe, NP   12/04/2022

## 2023-01-29 ENCOUNTER — Non-Acute Institutional Stay (SKILLED_NURSING_FACILITY): Payer: Medicare Other | Admitting: Family Medicine

## 2023-01-29 DIAGNOSIS — I10 Essential (primary) hypertension: Secondary | ICD-10-CM | POA: Diagnosis not present

## 2023-01-29 DIAGNOSIS — K269 Duodenal ulcer, unspecified as acute or chronic, without hemorrhage or perforation: Secondary | ICD-10-CM

## 2023-01-29 DIAGNOSIS — F0390 Unspecified dementia without behavioral disturbance: Secondary | ICD-10-CM | POA: Diagnosis not present

## 2023-01-29 DIAGNOSIS — R269 Unspecified abnormalities of gait and mobility: Secondary | ICD-10-CM | POA: Diagnosis not present

## 2023-01-29 NOTE — Progress Notes (Signed)
Provider:  Jacalyn Lefevre, MD Location:      Place of Service:     PCP: Mast, Man X, NP Patient Care Team: Mast, Man X, NP as PCP - General (Internal Medicine) [name] Mast, Man X, NP as Nurse Practitioner (Nurse Practitioner) [name], MD as Attending Physician (Urology) [name], MD as Consulting Physician (Gastroenterology)  Extended Emergency Contact Information Primary Emergency Contact: Limbert,[name] Address: [address]          [name] [address] [name] Home Phone: [phone] Relation: Son  Code Status:  Goals of Care: Advanced Directive information    12/04/2022    9:01 AM  Advanced Directives  Does Patient Have a Medical Advance Directive? Yes  Type of Estate agent of [place];Living will;Out of facility DNR (pink MOST or yellow form)  Does patient want to make changes to medical advance directive? No - Patient declined  Copy of Healthcare Power of Attorney in Chart? Yes - validated most recent copy scanned in chart (See row information)  Pre-existing out of facility DNR order (yellow form or pink MOST form) Yellow form placed in chart (order not valid for inpatient use)      No chief complaint on file.   HPI: Patient is a 89 y.o. female seen today for medical management of chronic problems including dementia without behavioral disturbance duodenal ulcer without hemorrhage or perforation, history of osteoarthritis and gait abnormality, urinary frequency.  Nursing reports no new problems.  Last MMSE was scored 20/30, 3 years ago There have been no falls.  Weight is stable continues to take memantine but off donepezil  Past Medical History:  Diagnosis Date   Allergic rhinitis due to pollen 07/01/2009   Anemia, unspecified 09/07/2010   Arthritis    Benign paroxysmal positional vertigo    Candidiasis 01/01/2012   Cramp of limb 01/11/2011   Disorder of bone and cartilage, unspecified 11/10/2010    Dizziness and giddiness 11/10/2010   Insomnia, unspecified 09/07/2010   Knee pain, left anterior    Lumbago 07/01/2009   Lump or mass in breast 08/01/2012   Memory loss 01/22/2012   Myalgia and myositis, unspecified 05/17/2011   Myopathy, unspecified    Osteoarthrosis, unspecified whether generalized or localized, lower leg    Other abnormal glucose 03/23/2010   Other chest pain 08/28/2012   Other disorders of bone and cartilage(733.99) 09/19/2012   Overactive bladder    Rash    inner legs- ? med allergy- to see PCP if doesnt improve by end of week   Rash and nonspecific skin eruption 10/28/2014   Right bundle branch block    Seborrheic keratoses 05/17/2011   Spasm of muscle 01/11/2011   Spinal stenosis, lumbar region, without neurogenic claudication    Stress incontinence 07/08/2014   Unspecified constipation 12/18/2011   Unspecified tinnitus    Unspecified urinary incontinence    Urgency of urination    Urinary frequency 09/18/2012   Past Surgical History:  Procedure Laterality Date   ABDOMINAL HYSTERECTOMY     APPENDECTOMY  1956   BACK SURGERY     lumbar-- ? type  Dr Shelle Iron   BREAST LUMPECTOMY  1993   breast cyst   BREAST SURGERY     right lumpectomy   CLEFT LIP REPAIR     with repair palate   ESOPHAGOGASTRODUODENOSCOPY Left 03/10/2013   Procedure: ESOPHAGOGASTRODUODENOSCOPY (EGD);  Surgeon: Theda Belfast, MD;  Location: [facility];  Service: Endoscopy;  Laterality: Left;  EYE SURGERY     bilateral cataract extraction with IOL   TOTAL KNEE ARTHROPLASTY  12/13/2011   Procedure: TOTAL KNEE ARTHROPLASTY;  Surgeon: Javier Docker, MD;  Location: [facility];  Service: Orthopedics;  Laterality: Left;    reports that she has never smoked. She has never used smokeless tobacco. She reports that she does not drink alcohol and does not use drugs. Social History   Socioeconomic History   Marital status: Divorced    Spouse name: Not on file   Number of children: Not on file   Years of  education: Not on file   Highest education level: Not on file  Occupational History   Occupation: retired Buyer, retail  Tobacco Use   Smoking status: Never   Smokeless tobacco: Never  Substance and Sexual Activity   Alcohol use: No    Comment: socially   wine   Drug use: No   Sexual activity: Never    Birth control/protection: Post-menopausal  Other Topics Concern   Not on file  Social History Narrative   Lives at [facility] [name] since 08/2007   Divorce    Never smoked   Alcohol -rare   Exercise none    Living Will   Social Determinants of Health   Financial Resource Strain: Not on file  Food Insecurity: Not on file  Transportation Needs: Not on file  Physical Activity: Not on file  Stress: Not on file  Social Connections: Not on file  Intimate Partner Violence: Not on file    Functional Status Survey:    No family history on file.  Health Maintenance  Topic Date Due   COVID-19 Vaccine (6 - 2023-24 season) 08/23/2022   INFLUENZA VACCINE  03/28/2023   Medicare Annual Wellness (AWV)  12/04/2023   DTaP/Tdap/Td (3 - Td or Tdap) 11/01/2031   Pneumonia Vaccine 64+ Years old  Completed   DEXA SCAN  Completed   Zoster Vaccines- Shingrix  Completed   HPV VACCINES  Aged Out    No Known Allergies  Outpatient Encounter Medications as of 01/29/2023  Medication Sig   acetaminophen (TYLENOL) 500 MG tablet Take 500 mg by mouth 2 (two) times daily.   chlorhexidine (PERIDEX) 0.12 % solution Use as directed in the mouth or throat 2 (two) times daily.  Give 1 application by mouth two times a day related to OTHER LESIONS OF ORAL MUCOSA (K13.79)   HYDROcodone-acetaminophen (NORCO/VICODIN) 5-325 MG tablet Take 1 tablet by mouth 2 (two) times daily. For back pain   memantine (NAMENDA) 10 MG tablet Take 10 mg by mouth 2 (two) times daily.   pantoprazole (PROTONIX) 40 MG tablet Take 1 tablet (40 mg total) by mouth 2 (two) times daily.   polyethylene glycol (MIRALAX / GLYCOLAX) 17  g packet Take 17 g by mouth daily.   sodium fluoride (FLUORISHIELD) 1.1 % GEL dental gel Place 1 Application onto teeth at bedtime.   No facility-administered encounter medications on file as of 01/29/2023.    Review of Systems  Unable to perform ROS: Dementia    There were no vitals filed for this visit. There is no height or weight on file to calculate BMI. Physical Exam Vitals and nursing note reviewed.  Constitutional:      Appearance: Normal appearance.     Comments: No real meaningful conversation patient laughs at my questions  Eyes:     Conjunctiva/sclera: Conjunctivae normal.     Pupils: Pupils are equal, round, and reactive to light.  Cardiovascular:  Rate and Rhythm: Normal rate and regular rhythm.  Pulmonary:     Effort: Pulmonary effort is normal.     Breath sounds: Normal breath sounds.  Abdominal:     General: Bowel sounds are normal.     Palpations: Abdomen is soft.  Musculoskeletal:     Cervical back: Normal range of motion.     Comments: Uses a walker  Neurological:     General: No focal deficit present.     Mental Status: She is alert.     Comments: Oriented to self only     Labs reviewed: Basic Metabolic Panel: Recent Labs    09/04/22 0000  NA 141  K 4.2  CL 104  CO2 30*  BUN 14  CREATININE 0.8  CALCIUM 9.7   Liver Function Tests: Recent Labs    09/04/22 0000  AST 12*  ALT 9  ALKPHOS 114  ALBUMIN 3.8   No results for input(s): "LIPASE", "AMYLASE" in the last 8760 hours. No results for input(s): "AMMONIA" in the last 8760 hours. CBC: Recent Labs    09/04/22 0000  WBC 6.5  NEUTROABS 4,258.00  HGB 15.0  HCT 45  PLT 300   Cardiac Enzymes: No results for input(s): "CKTOTAL", "CKMB", "CKMBINDEX", "TROPONINI" in the last 8760 hours. BNP: Invalid input(s): "POCBNP" Lab Results  Component Value Date   HGBA1C 5.6 04/24/2016   Lab Results  Component Value Date   TSH 1.28 09/04/2022   Lab Results  Component Value Date    VITAMINB12 365 12/19/2017   No results found for: "FOLATE" No results found for: "IRON", "TIBC", "FERRITIN"  Imaging and Procedures obtained prior to SNF admission: NM Renal Imaging Flow W/Pharm  Result Date: 04/06/2013 *RADIOLOGY REPORT* Clinical Data:  Right-sided hydronephrosis on CT. NUCLEAR MEDICINE RENAL SCAN WITH DIURETIC ADMINISTRATION Technique:  Radionuclide angiographic and sequential renal images were obtained after intravenous injection of radiopharmaceutical. Imaging was continued during slow intravenous injection of Lasix approximately 15 minutes after the start of the examination. Radiopharmaceutical:  16 mCi Tc-87m MAG3 Comparison:  Renal ultrasound 09/23/2012.  Abdominal pelvic CT 03/10/2013. Findings: Perfusion images demonstrate early symmetric perfusion to the kidneys.  Differential renal uptake at 2-3 minutes is 50.5% on the right and 49.5% on the left. Renogram images demonstrate normal renal cortical uptake and excretion bilaterally.  There is mild to moderate dilatation of the right renal pelvis with retained contrast prior to Lasix administration.  However, this contrast washes out following Lasix. There is no significant retained contrast within the collecting system post voiding. The right renogram curve is downsloping following Lasix. Differential renal function is 50.2% right and 49.8% left.  Post Lasix T 1/2 times are 3.5 minutes on the right and 6.0 minutes on the left. IMPRESSION: 1.  Dilatation of the right renal pelvis with good washout of activity following Lasix.  No significant ureteral obstruction demonstrated. 2.  Differential renal function is nearly symmetric. Original Report Authenticated By: Carey Bullocks, M.D.    Assessment/Plan 1. Dementia without behavioral disturbance (HCC) I see no significant change.  As noted above she is nonconversant  2. Duodenal ulcer without hemorrhage or perforation Continues on pantoprazole   3. Gait abnormality  No  recent falls ambulates with walker 4. Primary hypertension Blood pressure today 107/70    Family/ staff Communication:   Labs/tests ordered:  .smmsig

## 2023-02-25 ENCOUNTER — Encounter: Payer: Self-pay | Admitting: Nurse Practitioner

## 2023-02-25 ENCOUNTER — Non-Acute Institutional Stay (SKILLED_NURSING_FACILITY): Payer: Medicare Other | Admitting: Nurse Practitioner

## 2023-02-25 DIAGNOSIS — I10 Essential (primary) hypertension: Secondary | ICD-10-CM

## 2023-02-25 DIAGNOSIS — K5901 Slow transit constipation: Secondary | ICD-10-CM

## 2023-02-25 DIAGNOSIS — F0390 Unspecified dementia without behavioral disturbance: Secondary | ICD-10-CM | POA: Diagnosis not present

## 2023-02-25 DIAGNOSIS — K257 Chronic gastric ulcer without hemorrhage or perforation: Secondary | ICD-10-CM | POA: Diagnosis not present

## 2023-02-25 DIAGNOSIS — M159 Polyosteoarthritis, unspecified: Secondary | ICD-10-CM | POA: Diagnosis not present

## 2023-02-25 DIAGNOSIS — R269 Unspecified abnormalities of gait and mobility: Secondary | ICD-10-CM

## 2023-02-25 DIAGNOSIS — N3281 Overactive bladder: Secondary | ICD-10-CM

## 2023-02-25 NOTE — Progress Notes (Unsigned)
Location:  [facility] Room Number: NO/51/A Place of Service:  SNF (31) Provider:  Mckinzy Fuller X, NP  Patient Care Team: [name], NP as PCP - General (Internal Medicine) [name] Rudi Knippenberg X, NP as Nurse Practitioner (Nurse Practitioner) [name], MD as Attending Physician (Urology) [name], MD as Consulting Physician (Gastroenterology)  Extended Emergency Contact Information Primary Emergency Contact: [name],[name] Address: [address]          [name] [address] [name] Home Phone: [phone] Relation: Son  Code Status:  DNR Goals of care: Advanced Directive information    02/25/2023   10:32 AM  Advanced Directives  Does Patient Have a Medical Advance Directive? Yes  Type of Estate agent of [place];Out of facility DNR (pink MOST or yellow form)  Does patient want to make changes to medical advance directive? No - Patient declined  Copy of Healthcare Power of Attorney in Chart? Yes - validated most recent copy scanned in chart (See row information)  Pre-existing out of facility DNR order (yellow form or pink MOST form) Pink MOST form placed in chart (order not valid for inpatient use)     Chief Complaint  Patient presents with   Medical Management of Chronic Issues    Patient is here for a follow up for chronic conditions     HPI:  Pt is a 89 y.o. female seen today for medical management of chronic diseases.     Past Medical History:  Diagnosis Date   Allergic rhinitis due to pollen 07/01/2009   Anemia, unspecified 09/07/2010   Arthritis    Benign paroxysmal positional vertigo    Candidiasis 01/01/2012   Cramp of limb 01/11/2011   Disorder of bone and cartilage, unspecified 11/10/2010   Dizziness and giddiness 11/10/2010   Insomnia, unspecified 09/07/2010   Knee pain, left anterior    Lumbago 07/01/2009   Lump or mass in breast 08/01/2012   Memory loss 01/22/2012    Myalgia and myositis, unspecified 05/17/2011   Myopathy, unspecified    Osteoarthrosis, unspecified whether generalized or localized, lower leg    Other abnormal glucose 03/23/2010   Other chest pain 08/28/2012   Other disorders of bone and cartilage(733.99) 09/19/2012   Overactive bladder    Rash    inner legs- ? med allergy- to see PCP if doesnt improve by end of week   Rash and nonspecific skin eruption 10/28/2014   Right bundle branch block    Seborrheic keratoses 05/17/2011   Spasm of muscle 01/11/2011   Spinal stenosis, lumbar region, without neurogenic claudication    Stress incontinence 07/08/2014   Unspecified constipation 12/18/2011   Unspecified tinnitus    Unspecified urinary incontinence    Urgency of urination    Urinary frequency 09/18/2012   Past Surgical History:  Procedure Laterality Date   ABDOMINAL HYSTERECTOMY     APPENDECTOMY  1956   BACK SURGERY     lumbar-- ? type  Dr Shelle Iron   BREAST LUMPECTOMY  1993   breast cyst   BREAST SURGERY     right lumpectomy   CLEFT LIP REPAIR     with repair palate   ESOPHAGOGASTRODUODENOSCOPY Left 03/10/2013   Procedure: ESOPHAGOGASTRODUODENOSCOPY (EGD);  Surgeon: Theda Belfast, MD;  Location: [facility];  Service: Endoscopy;  Laterality: Left;   EYE SURGERY     bilateral cataract extraction with IOL   TOTAL KNEE ARTHROPLASTY  12/13/2011   Procedure: TOTAL KNEE ARTHROPLASTY;  Surgeon: Javier Docker, MD;  Location: [facility];  Service: Orthopedics;  Laterality: Left;    No Known Allergies  Outpatient Encounter Medications as of 02/25/2023  Medication Sig   acetaminophen (TYLENOL) 500 MG tablet Take 500 mg by mouth 2 (two) times daily.   chlorhexidine (PERIDEX) 0.12 % solution Use as directed in the mouth or throat 2 (two) times daily.  Give 1 application by mouth two times a day related to OTHER LESIONS OF ORAL MUCOSA (K13.79)   HYDROcodone-acetaminophen (NORCO/VICODIN) 5-325 MG tablet Take 1 tablet by mouth 2 (two) times daily.  For back pain   memantine (NAMENDA) 10 MG tablet Take 10 mg by mouth 2 (two) times daily.   pantoprazole (PROTONIX) 40 MG tablet Take 1 tablet (40 mg total) by mouth 2 (two) times daily.   polyethylene glycol (MIRALAX / GLYCOLAX) 17 g packet Take 17 g by mouth daily.   sodium fluoride (FLUORISHIELD) 1.1 % GEL dental gel Place 1 Application onto teeth at bedtime.   No facility-administered encounter medications on file as of 02/25/2023.    Review of Systems  Immunization History  Administered Date(s) Administered   Influenza Whole 05/27/2012, 06/10/2013, 06/06/2018   Influenza, High Dose Seasonal PF 05/30/2019, 06/25/2022   Influenza-Unspecified 06/14/2014, 05/26/2015, 06/13/2016, 06/17/2017, 06/08/2020, 06/14/2021   Moderna SARS-COV2 Booster Vaccination 01/12/2022, 06/28/2022   Moderna Sars-Covid-2 Vaccination 08/29/2019, 09/26/2019, 07/05/2020, 01/24/2021   Pfizer Covid-19 Vaccine Bivalent Booster 89yrs & up 05/16/2021   Pneumococcal Conjugate-13 06/24/2017   Pneumococcal Polysaccharide-23 10/03/2011   Td 10/03/2011   Tdap 10/31/2021   Zoster Recombinant(Shingrix) 10/31/2021, 01/31/2022   Zoster, Live 08/28/2007   Pertinent  Health Maintenance Due  Topic Date Due   INFLUENZA VACCINE  03/28/2023   DEXA SCAN  Completed      09/15/2015    3:29 PM 05/17/2017    9:30 AM 03/27/2019    5:06 PM 05/29/2022    3:06 PM 02/25/2023   10:32 AM  Fall Risk  Falls in the past year? No No  0 0  Number of falls in past year - Comments   Emmi Telephone Survey Actual Response =     Was there an injury with Fall?    0 0  Fall Risk Category Calculator    0 0  Fall Risk Category (Retired)    Low   (RETIRED) Patient Fall Risk Level    Low fall risk   Patient at Risk for Falls Due to    No Fall Risks No Fall Risks  Fall risk Follow up    Falls evaluation completed Falls evaluation completed   Functional Status Survey:    Vitals:   02/25/23 1033  BP: 116/71  Pulse: 80  Resp: 20  Temp: 98.2 F  (36.8 C)  SpO2: 97%  Weight: 152 lb (68.9 kg)  Height: 5\' 6"  (1.676 m)   Body mass index is 24.53 kg/m. Physical Exam  Labs reviewed: Recent Labs    09/04/22 0000  NA 141  K 4.2  CL 104  CO2 30*  BUN 14  CREATININE 0.8  CALCIUM 9.7   Recent Labs    09/04/22 0000  AST 12*  ALT 9  ALKPHOS 114  ALBUMIN 3.8   Recent Labs    09/04/22 0000  WBC 6.5  NEUTROABS 4,258.00  HGB 15.0  HCT 45  PLT 300   Lab Results  Component Value Date   TSH 1.28 09/04/2022   Lab Results  Component Value Date   HGBA1C 5.6  04/24/2016   Lab Results  Component Value Date   CHOL 189 09/06/2015   HDL 65 09/06/2015   LDLCALC 99 09/06/2015   TRIG 126 09/06/2015    Significant Diagnostic Results in last 30 days:  No results found.  Assessment/Plan There are no diagnoses linked to this encounter.   Family/ staff Communication: ***  Labs/tests ordered:  ***

## 2023-02-26 ENCOUNTER — Encounter: Payer: Self-pay | Admitting: Nurse Practitioner

## 2023-02-26 NOTE — Assessment & Plan Note (Addendum)
on Memantine, Donepezil for memory. MMSE 20/30 09/10/19. TSH 1.28 09/04/22, 12/04/22 declined further DEXA, eye exam.

## 2023-02-26 NOTE — Assessment & Plan Note (Signed)
Blood pressure is controlled, not taking antihypertensive meds.  

## 2023-02-26 NOTE — Assessment & Plan Note (Addendum)
No changes.  Off Myrbetriq

## 2023-02-26 NOTE — Assessment & Plan Note (Signed)
uses walker for ambulation less than prior.

## 2023-02-26 NOTE — Assessment & Plan Note (Signed)
GERD/PUD/gastric erosions, stable, on Pantoprazole,  Hgb 15 09/04/22 

## 2023-02-26 NOTE — Assessment & Plan Note (Addendum)
s/p L TKR 2013,  pain is controlled on Tylenol

## 2023-02-26 NOTE — Assessment & Plan Note (Signed)
Stable, diet controlled.  

## 2023-03-22 ENCOUNTER — Encounter: Payer: Self-pay | Admitting: Nurse Practitioner

## 2023-03-22 ENCOUNTER — Non-Acute Institutional Stay (SKILLED_NURSING_FACILITY): Payer: Medicare Other | Admitting: Nurse Practitioner

## 2023-03-22 DIAGNOSIS — K5901 Slow transit constipation: Secondary | ICD-10-CM | POA: Diagnosis not present

## 2023-03-22 DIAGNOSIS — R269 Unspecified abnormalities of gait and mobility: Secondary | ICD-10-CM

## 2023-03-22 DIAGNOSIS — K269 Duodenal ulcer, unspecified as acute or chronic, without hemorrhage or perforation: Secondary | ICD-10-CM | POA: Diagnosis not present

## 2023-03-22 DIAGNOSIS — M159 Polyosteoarthritis, unspecified: Secondary | ICD-10-CM | POA: Diagnosis not present

## 2023-03-22 DIAGNOSIS — N3281 Overactive bladder: Secondary | ICD-10-CM

## 2023-03-22 DIAGNOSIS — F0390 Unspecified dementia without behavioral disturbance: Secondary | ICD-10-CM | POA: Diagnosis not present

## 2023-03-22 DIAGNOSIS — H8110 Benign paroxysmal vertigo, unspecified ear: Secondary | ICD-10-CM

## 2023-03-22 DIAGNOSIS — M150 Primary generalized (osteo)arthritis: Secondary | ICD-10-CM

## 2023-03-22 NOTE — Assessment & Plan Note (Signed)
no recent occurrence.

## 2023-03-22 NOTE — Assessment & Plan Note (Signed)
s/p L TKR 2013,  on Tylenol

## 2023-03-22 NOTE — Assessment & Plan Note (Signed)
on Memantine for memory. MMSE 20/30 09/10/19. TSH 1.28 09/04/22, no behavioral issues.

## 2023-03-22 NOTE — Progress Notes (Signed)
Location:  [facility] Room Number: 51A Place of Service:  SNF (31) Provider:   Arrion Broaddus X, NP   Loyal Holzheimer X, NP  Patient Care Team: Issa Luster X, NP as PCP - General (Internal Medicine) [name] Lataunya Ruud X, NP as Nurse Practitioner (Nurse Practitioner) [name], MD as Attending Physician (Urology) [name], MD as Consulting Physician (Gastroenterology)  Extended Emergency Contact Information Primary Emergency Contact: Pollick,[name] Address: [address]          [name] [address] [name] Home Phone: [phone] Relation: Son  Code Status:  DNR Goals of care: Advanced Directive information    03/22/2023   12:00 PM  Advanced Directives  Does Patient Have a Medical Advance Directive? Yes  Type of Estate agent of [place];Out of facility DNR (pink MOST or yellow form)  Does patient want to make changes to medical advance directive? No - Patient declined  Copy of Healthcare Power of Attorney in Chart? Yes - validated most recent copy scanned in chart (See row information)  Pre-existing out of facility DNR order (yellow form or pink MOST form) Pink MOST form placed in chart (order not valid for inpatient use)     Chief Complaint  Patient presents with   Medical Management of Chronic Issues    Patient is being seen for routine visit    Immunizations    Patient is due for a covid vaccine     HPI:  Pt is a 89 y.o. female seen today for medical management of chronic diseases.    Dementia, on Memantine for memory. MMSE 20/30 09/10/19. TSH 1.28 09/04/22             Hx of OA, s/p L TKR 2013,  on Tylenol             Gait abnormalit, uses walker for ambulation less than prior.       GERD/PUD/gastric erosions, stable, on Pantoprazole,  Hgb 15 09/04/22             Urinary frequency, not taking meds             Constipation, stable             BPPV no recent occurrence.    Past Medical  History:  Diagnosis Date   Allergic rhinitis due to pollen 07/01/2009   Anemia, unspecified 09/07/2010   Arthritis    Benign paroxysmal positional vertigo    Candidiasis 01/01/2012   Cramp of limb 01/11/2011   Disorder of bone and cartilage, unspecified 11/10/2010   Dizziness and giddiness 11/10/2010   Insomnia, unspecified 09/07/2010   Knee pain, left anterior    Lumbago 07/01/2009   Lump or mass in breast 08/01/2012   Memory loss 01/22/2012   Myalgia and myositis, unspecified 05/17/2011   Myopathy, unspecified    Osteoarthrosis, unspecified whether generalized or localized, lower leg    Other abnormal glucose 03/23/2010   Other chest pain 08/28/2012   Other disorders of bone and cartilage(733.99) 09/19/2012   Overactive bladder    Rash    inner legs- ? med allergy- to see PCP if doesnt improve by end of week   Rash and nonspecific skin eruption 10/28/2014   Right bundle branch block    Seborrheic keratoses 05/17/2011   Spasm of muscle 01/11/2011   Spinal stenosis, lumbar region, without neurogenic claudication    Stress incontinence 07/08/2014   Unspecified constipation 12/18/2011   Unspecified tinnitus  Unspecified urinary incontinence    Urgency of urination    Urinary frequency 09/18/2012   Past Surgical History:  Procedure Laterality Date   ABDOMINAL HYSTERECTOMY     APPENDECTOMY  1956   BACK SURGERY     lumbar-- ? type  Dr Shelle Iron   BREAST LUMPECTOMY  1993   breast cyst   BREAST SURGERY     right lumpectomy   CLEFT LIP REPAIR     with repair palate   ESOPHAGOGASTRODUODENOSCOPY Left 03/10/2013   Procedure: ESOPHAGOGASTRODUODENOSCOPY (EGD);  Surgeon: Theda Belfast, MD;  Location: [facility];  Service: Endoscopy;  Laterality: Left;   EYE SURGERY     bilateral cataract extraction with IOL   TOTAL KNEE ARTHROPLASTY  12/13/2011   Procedure: TOTAL KNEE ARTHROPLASTY;  Surgeon: Javier Docker, MD;  Location: [facility];  Service: Orthopedics;  Laterality: Left;    No Known  Allergies  Outpatient Encounter Medications as of 03/22/2023  Medication Sig   acetaminophen (TYLENOL) 500 MG tablet Take 500 mg by mouth 2 (two) times daily.   chlorhexidine (PERIDEX) 0.12 % solution Use as directed in the mouth or throat 2 (two) times daily.  Give 1 application by mouth two times a day related to OTHER LESIONS OF ORAL MUCOSA (K13.79)   memantine (NAMENDA) 10 MG tablet Take 10 mg by mouth 2 (two) times daily.   omeprazole (PRILOSEC) 20 MG capsule Take 20 mg by mouth daily.   polyethylene glycol (MIRALAX / GLYCOLAX) 17 g packet Take 17 g by mouth daily.   sodium fluoride (FLUORISHIELD) 1.1 % GEL dental gel Place 1 Application onto teeth at bedtime.   HYDROcodone-acetaminophen (NORCO/VICODIN) 5-325 MG tablet Take 1 tablet by mouth 2 (two) times daily. For back pain (Patient not taking: Reported on 03/22/2023)   pantoprazole (PROTONIX) 40 MG tablet Take 1 tablet (40 mg total) by mouth 2 (two) times daily. (Patient not taking: Reported on 03/22/2023)   No facility-administered encounter medications on file as of 03/22/2023.    Review of Systems  Unable to perform ROS: Dementia    Immunization History  Administered Date(s) Administered   Influenza Whole 05/27/2012, 06/10/2013, 06/06/2018   Influenza, High Dose Seasonal PF 05/30/2019, 06/25/2022   Influenza-Unspecified 06/14/2014, 05/26/2015, 06/13/2016, 06/17/2017, 06/08/2020, 06/14/2021   Moderna SARS-COV2 Booster Vaccination 01/12/2022, 06/28/2022   Moderna Sars-Covid-2 Vaccination 08/29/2019, 09/26/2019, 07/05/2020, 01/24/2021   Pfizer Covid-19 Vaccine Bivalent Booster 61yrs & up 05/16/2021   Pneumococcal Conjugate-13 06/24/2017   Pneumococcal Polysaccharide-23 10/03/2011   Td 10/03/2011   Tdap 10/31/2021   Zoster Recombinant(Shingrix) 10/31/2021, 01/31/2022   Zoster, Live 08/28/2007   Pertinent  Health Maintenance Due  Topic Date Due   INFLUENZA VACCINE  03/28/2023   DEXA SCAN  Completed      09/15/2015    3:29  PM 05/17/2017    9:30 AM 03/27/2019    5:06 PM 05/29/2022    3:06 PM 02/25/2023   10:32 AM  Fall Risk  Falls in the past year? No No -- 0 0  Number of falls in past year - Comments   Emmi Telephone Survey Actual Response =     Was there an injury with Fall?    0 0  Fall Risk Category Calculator    0 0  Fall Risk Category (Retired)    Low   (RETIRED) Patient Fall Risk Level    Low fall risk   Patient at Risk for Falls Due to    No Fall Risks No Fall Risks  Fall risk Follow up    Falls evaluation completed Falls evaluation completed   Functional Status Survey:    Vitals:   03/22/23 1144  BP: 110/76  Pulse: 88  Resp: 20  Temp: (!) 97.2 F (36.2 C)  TempSrc: Temporal  SpO2: 93%  Weight: 149 lb 6.4 oz (67.8 kg)  Height: 5\' 6"  (1.676 m)   Body mass index is 24.11 kg/m. Physical Exam Vitals and nursing note reviewed.  Constitutional:      Appearance: Normal appearance.  HENT:     Head: Normocephalic and atraumatic.     Mouth/Throat:     Mouth: Mucous membranes are moist.  Eyes:     Extraocular Movements: Extraocular movements intact.     Conjunctiva/sclera:     Right eye: Right conjunctiva is not injected.     Left eye: Left conjunctiva is not injected.     Pupils: Pupils are equal, round, and reactive to light.  Cardiovascular:     Rate and Rhythm: Normal rate and regular rhythm.     Heart sounds: No murmur heard. Pulmonary:     Effort: Pulmonary effort is normal.     Breath sounds: No rales.  Abdominal:     General: Bowel sounds are normal.     Palpations: Abdomen is soft.     Tenderness: There is no abdominal tenderness.  Musculoskeletal:     Cervical back: Normal range of motion and neck supple.     Right lower leg: No edema.     Left lower leg: No edema.     Comments: Ambulates with walker. Left knee pain when walking.   Skin:    General: Skin is warm and dry.  Neurological:     General: No focal deficit present.     Mental Status: She is alert. Mental status  is at baseline.     Gait: Gait abnormal.     Comments: Oriented to self, her room. Ambulates with walker with SBA  Psychiatric:        Mood and Affect: Mood normal.        Behavior: Behavior normal.     Labs reviewed: Recent Labs    09/04/22 0000  NA 141  K 4.2  CL 104  CO2 30*  BUN 14  CREATININE 0.8  CALCIUM 9.7   Recent Labs    09/04/22 0000  AST 12*  ALT 9  ALKPHOS 114  ALBUMIN 3.8   Recent Labs    09/04/22 0000  WBC 6.5  NEUTROABS 4,258.00  HGB 15.0  HCT 45  PLT 300   Lab Results  Component Value Date   TSH 1.28 09/04/2022   Lab Results  Component Value Date   HGBA1C 5.6 04/24/2016   Lab Results  Component Value Date   CHOL 189 09/06/2015   HDL 65 09/06/2015   LDLCALC 99 09/06/2015   TRIG 126 09/06/2015    Significant Diagnostic Results in last 30 days:  No results found.  Assessment/Plan Osteoarthritis, multiple sites  s/p L TKR 2013,  on Tylenol  Gait abnormality uses walker for ambulation less than prior.   Duodenal ulcer without hemorrhage or perforation GERD/PUD/gastric erosions, stable, on Pantoprazole,  Hgb 15 09/04/22  Overactive bladder No change, not taking meds  Slow transit constipation stable  Benign paroxysmal positional vertigo no recent occurrence.      Family/ staff Communication: plan of care reviewed with the patient and charge nurse.   Labs/tests ordered:  none  Time spend 25 minutes.

## 2023-03-22 NOTE — Assessment & Plan Note (Signed)
stable °

## 2023-03-22 NOTE — Assessment & Plan Note (Signed)
No change, not taking meds

## 2023-03-22 NOTE — Assessment & Plan Note (Signed)
GERD/PUD/gastric erosions, stable, on Pantoprazole,  Hgb 15 09/04/22 

## 2023-03-22 NOTE — Assessment & Plan Note (Signed)
uses walker for ambulation less than prior.

## 2023-04-02 ENCOUNTER — Non-Acute Institutional Stay: Payer: Self-pay | Admitting: Nurse Practitioner

## 2023-04-02 ENCOUNTER — Encounter: Payer: Self-pay | Admitting: Nurse Practitioner

## 2023-04-02 DIAGNOSIS — W19XXXA Unspecified fall, initial encounter: Secondary | ICD-10-CM | POA: Diagnosis not present

## 2023-04-02 DIAGNOSIS — R269 Unspecified abnormalities of gait and mobility: Secondary | ICD-10-CM

## 2023-04-02 DIAGNOSIS — F0390 Unspecified dementia without behavioral disturbance: Secondary | ICD-10-CM | POA: Diagnosis not present

## 2023-04-02 NOTE — Assessment & Plan Note (Signed)
fall when the patient leaned over in w/c and fell out of chair, no apparent injury. Close supervision and assistance needed for safety.

## 2023-04-02 NOTE — Progress Notes (Signed)
Location:   SNF [facility]   Place of Service:    Provider: Chipper Oman NP  Lianni Kanaan X, NP  Patient Care Team: Ilijah Doucet X, NP as PCP - General (Internal Medicine) [name] Latrena Benegas X, NP as Nurse Practitioner (Nurse Practitioner) [name], MD as Attending Physician (Urology) [name], MD as Consulting Physician (Gastroenterology)  Extended Emergency Contact Information Primary Emergency Contact: Kneece,[name] Address: [address]          [name] [address] [name] Home Phone: [phone] Relation: Son  Code Status: DNR Goals of care: Advanced Directive information    04/02/2023    3:48 PM  Advanced Directives  Does Patient Have a Medical Advance Directive? Yes  Type of Estate agent of [place];Living will;Out of facility DNR (pink MOST or yellow form)  Does patient want to make changes to medical advance directive? No - Patient declined  Copy of Healthcare Power of Attorney in Chart? Yes - validated most recent copy scanned in chart (See row information)  Pre-existing out of facility DNR order (yellow form or pink MOST form) Yellow form placed in chart (order not valid for inpatient use)     Chief Complaint  Patient presents with   Acute Visit    Fall    HPI:  Pt is a 89 y.o. female seen today for an acute visit for fall when the patient leaned over in w/c and fell out of chair, no apparent injury.   Dementia, on Memantine for memory. MMSE 20/30 09/10/19. TSH 1.28 09/04/22             Hx of OA, s/p L TKR 2013,  on Tylenol             Gait abnormalit, uses walker for ambulation less than prior.             GERD/PUD/gastric erosions, stable, on Pantoprazole,  Hgb 15 09/04/22             Urinary frequency, not taking meds             Constipation, stable             BPPV no recent occurrence.         Past Medical History:  Diagnosis Date   Allergic rhinitis due to pollen 07/01/2009   Anemia,  unspecified 09/07/2010   Arthritis    Benign paroxysmal positional vertigo    Candidiasis 01/01/2012   Cramp of limb 01/11/2011   Disorder of bone and cartilage, unspecified 11/10/2010   Dizziness and giddiness 11/10/2010   Insomnia, unspecified 09/07/2010   Knee pain, left anterior    Lumbago 07/01/2009   Lump or mass in breast 08/01/2012   Memory loss 01/22/2012   Myalgia and myositis, unspecified 05/17/2011   Myopathy, unspecified    Osteoarthrosis, unspecified whether generalized or localized, lower leg    Other abnormal glucose 03/23/2010   Other chest pain 08/28/2012   Other disorders of bone and cartilage(733.99) 09/19/2012   Overactive bladder    Rash    inner legs- ? med allergy- to see PCP if doesnt improve by end of week   Rash and nonspecific skin eruption 10/28/2014   Right bundle branch block    Seborrheic keratoses 05/17/2011   Spasm of muscle 01/11/2011   Spinal stenosis, lumbar region, without neurogenic claudication    Stress incontinence 07/08/2014   Unspecified constipation 12/18/2011   Unspecified tinnitus    Unspecified urinary incontinence  Urgency of urination    Urinary frequency 09/18/2012   Past Surgical History:  Procedure Laterality Date   ABDOMINAL HYSTERECTOMY     APPENDECTOMY  1956   BACK SURGERY     lumbar-- ? type  Dr Shelle Iron   BREAST LUMPECTOMY  1993   breast cyst   BREAST SURGERY     right lumpectomy   CLEFT LIP REPAIR     with repair palate   ESOPHAGOGASTRODUODENOSCOPY Left 03/10/2013   Procedure: ESOPHAGOGASTRODUODENOSCOPY (EGD);  Surgeon: Theda Belfast, MD;  Location: [facility];  Service: Endoscopy;  Laterality: Left;   EYE SURGERY     bilateral cataract extraction with IOL   TOTAL KNEE ARTHROPLASTY  12/13/2011   Procedure: TOTAL KNEE ARTHROPLASTY;  Surgeon: Javier Docker, MD;  Location: [facility];  Service: Orthopedics;  Laterality: Left;    No Known Allergies  Allergies as of 04/02/2023   No Known Allergies      Medication List         Accurate as of April 02, 2023 11:59 PM. If you have any questions, ask your nurse or doctor.          acetaminophen 500 MG tablet Commonly known as: TYLENOL Take 500 mg by mouth 2 (two) times daily.   chlorhexidine 0.12 % solution Commonly known as: PERIDEX Use as directed in the mouth or throat 2 (two) times daily.  Give 1 application by mouth two times a day related to OTHER LESIONS OF ORAL MUCOSA (K13.79)   HYDROcodone-acetaminophen 5-325 MG tablet Commonly known as: NORCO/VICODIN Take 1 tablet by mouth 2 (two) times daily. For back pain   memantine 10 MG tablet Commonly known as: NAMENDA Take 10 mg by mouth 2 (two) times daily.   omeprazole 20 MG capsule Commonly known as: PRILOSEC Take 20 mg by mouth daily.   pantoprazole 40 MG tablet Commonly known as: Protonix Take 1 tablet (40 mg total) by mouth 2 (two) times daily.   polyethylene glycol 17 g packet Commonly known as: MIRALAX / GLYCOLAX Take 17 g by mouth daily.   sodium fluoride 1.1 % Gel dental gel Commonly known as: FLUORISHIELD Place 1 Application onto teeth at bedtime.        Review of Systems  Unable to perform ROS: Dementia    Immunization History  Administered Date(s) Administered   Influenza Whole 05/27/2012, 06/10/2013, 06/06/2018   Influenza, High Dose Seasonal PF 05/30/2019, 06/25/2022   Influenza-Unspecified 06/14/2014, 05/26/2015, 06/13/2016, 06/17/2017, 06/08/2020, 06/14/2021   Moderna SARS-COV2 Booster Vaccination 01/12/2022, 06/28/2022   Moderna Sars-Covid-2 Vaccination 08/29/2019, 09/26/2019, 07/05/2020, 01/24/2021   Pfizer Covid-19 Vaccine Bivalent Booster 19yrs & up 05/16/2021   Pneumococcal Conjugate-13 06/24/2017   Pneumococcal Polysaccharide-23 10/03/2011   Td 10/03/2011   Tdap 10/31/2021   Zoster Recombinant(Shingrix) 10/31/2021, 01/31/2022   Zoster, Live 08/28/2007   Pertinent  Health Maintenance Due  Topic Date Due   INFLUENZA VACCINE  03/28/2023   DEXA SCAN  Completed       09/15/2015    3:29 PM 05/17/2017    9:30 AM 03/27/2019    5:06 PM 05/29/2022    3:06 PM 02/25/2023   10:32 AM  Fall Risk  Falls in the past year? No No -- 0 0  Number of falls in past year - Comments   Emmi Telephone Survey Actual Response =     Was there an injury with Fall?    0 0  Fall Risk Category Calculator    0 0  Fall Risk  Category (Retired)    Low   (RETIRED) Patient Fall Risk Level    Low fall risk   Patient at Risk for Falls Due to    No Fall Risks No Fall Risks  Fall risk Follow up    Falls evaluation completed Falls evaluation completed   Functional Status Survey:    Vitals:   04/02/23 1543  BP: 116/64  Pulse: 62  Resp: 18  Temp: 98 F (36.7 C)  SpO2: 93%  Weight: 146 lb 3.2 oz (66.3 kg)  Height: 5\' 6"  (1.676 m)   Body mass index is 23.6 kg/m. Physical Exam Vitals and nursing note reviewed.  Constitutional:      Appearance: Normal appearance.  HENT:     Head: Normocephalic and atraumatic.     Mouth/Throat:     Mouth: Mucous membranes are moist.  Eyes:     Extraocular Movements: Extraocular movements intact.     Conjunctiva/sclera:     Right eye: Right conjunctiva is not injected.     Left eye: Left conjunctiva is not injected.     Pupils: Pupils are equal, round, and reactive to light.  Cardiovascular:     Rate and Rhythm: Normal rate and regular rhythm.     Heart sounds: No murmur heard. Pulmonary:     Effort: Pulmonary effort is normal.     Breath sounds: No rales.  Abdominal:     General: Bowel sounds are normal.     Palpations: Abdomen is soft.     Tenderness: There is no abdominal tenderness.  Musculoskeletal:     Cervical back: Normal range of motion and neck supple.     Right lower leg: No edema.     Left lower leg: No edema.     Comments: Ambulates with walker. Left knee pain when walking.   Skin:    General: Skin is warm and dry.  Neurological:     General: No focal deficit present.     Mental Status: She is alert. Mental status  is at baseline.     Gait: Gait abnormal.     Comments: Oriented to self, her room. Ambulates with walker with SBA  Psychiatric:        Mood and Affect: Mood normal.        Behavior: Behavior normal.     Labs reviewed: Recent Labs    09/04/22 0000  NA 141  K 4.2  CL 104  CO2 30*  BUN 14  CREATININE 0.8  CALCIUM 9.7   Recent Labs    09/04/22 0000  AST 12*  ALT 9  ALKPHOS 114  ALBUMIN 3.8   Recent Labs    09/04/22 0000  WBC 6.5  NEUTROABS 4,258.00  HGB 15.0  HCT 45  PLT 300   Lab Results  Component Value Date   TSH 1.28 09/04/2022   Lab Results  Component Value Date   HGBA1C 5.6 04/24/2016   Lab Results  Component Value Date   CHOL 189 09/06/2015   HDL 65 09/06/2015   LDLCALC 99 09/06/2015   TRIG 126 09/06/2015    Significant Diagnostic Results in last 30 days:  No results found.  Assessment/Plan: Fall fall when the patient leaned over in w/c and fell out of chair, no apparent injury. Close supervision and assistance needed for safety.   Dementia without behavioral disturbance (HCC)  on Memantine for memory. MMSE 20/30 09/10/19. TSH 1.28 09/04/22, no change from her baseline mentation  Gait abnormality  uses walker for  ambulation less than prior.     Family/ staff Communication: plan of care reviewed with the patient and charge nurse.   Labs/tests ordered:  none  Time spend 25 minutes.

## 2023-04-02 NOTE — Assessment & Plan Note (Signed)
on Memantine for memory. MMSE 20/30 09/10/19. TSH 1.28 09/04/22, no change from her baseline mentation

## 2023-04-02 NOTE — Assessment & Plan Note (Signed)
uses walker for ambulation less than prior.

## 2023-04-03 ENCOUNTER — Encounter: Payer: Self-pay | Admitting: Nurse Practitioner

## 2023-04-15 ENCOUNTER — Encounter: Payer: Self-pay | Admitting: Nurse Practitioner

## 2023-04-15 ENCOUNTER — Non-Acute Institutional Stay (SKILLED_NURSING_FACILITY): Payer: Medicare Other | Admitting: Nurse Practitioner

## 2023-04-15 DIAGNOSIS — K5901 Slow transit constipation: Secondary | ICD-10-CM | POA: Diagnosis not present

## 2023-04-15 DIAGNOSIS — R5383 Other fatigue: Secondary | ICD-10-CM

## 2023-04-15 DIAGNOSIS — M159 Polyosteoarthritis, unspecified: Secondary | ICD-10-CM | POA: Diagnosis not present

## 2023-04-15 DIAGNOSIS — F0390 Unspecified dementia without behavioral disturbance: Secondary | ICD-10-CM

## 2023-04-15 DIAGNOSIS — R269 Unspecified abnormalities of gait and mobility: Secondary | ICD-10-CM | POA: Diagnosis not present

## 2023-04-15 DIAGNOSIS — K257 Chronic gastric ulcer without hemorrhage or perforation: Secondary | ICD-10-CM

## 2023-04-15 NOTE — Assessment & Plan Note (Signed)
W/c for mobility mostly

## 2023-04-15 NOTE — Assessment & Plan Note (Signed)
stable °

## 2023-04-15 NOTE — Progress Notes (Unsigned)
Location:   [facility] Room Number: 51-A Place of Service:  SNF (31) Provider:  Shelvy Heckert, NP    Patient Care Team: Roniyah Llorens X, NP as PCP - General (Internal Medicine) [name] Azile Minardi X, NP as Nurse Practitioner (Nurse Practitioner) [name], MD as Attending Physician (Urology) [name], MD as Consulting Physician (Gastroenterology)  Extended Emergency Contact Information Primary Emergency Contact: Maille,[name] Address: [address]          [name] [address] [name] Home Phone: [phone] Relation: Son  Code Status:  DNR Goals of care: Advanced Directive information    04/15/2023   11:38 AM  Advanced Directives  Does Patient Have a Medical Advance Directive? Yes  Type of Advance Directive Living will;Healthcare Power of [place];Out of facility DNR (pink MOST or yellow form)  Does patient want to make changes to medical advance directive? No - Patient declined  Copy of Healthcare Power of Attorney in Chart? Yes - validated most recent copy scanned in chart (See row information)     Chief Complaint  Patient presents with  . Acute Visit    Fatigue.    HPI:  Pt is a 89 y.o. female seen today for an acute visit for reported the patient's fatigue, takes a few bites and refuses to eat, sleeps all day, denied pain, afebrile, no noted cough, SOB, indigestion, nausea, vomiting, constipation, or diarrhea. Gradual weight loss about #10Ibs in the past 4-5 months. TSH 1.28 09/04/22  Dementia, off Memantine for memory. MMSE 20/30 09/10/19. TSH 1.28 09/04/22             Hx of OA, s/p L TKR 2013,  on Tylenol             Gait abnormalit, w/c for mobility             GERD/PUD/gastric erosions, stable, on Omeprazole,  Hgb 15 09/04/22             Urinary frequency, not taking meds             Constipation, stable             BPPV no recent occurrence.    Past Medical History:  Diagnosis Date  . Allergic  rhinitis due to pollen 07/01/2009  . Anemia, unspecified 09/07/2010  . Arthritis   . Benign paroxysmal positional vertigo   . Candidiasis 01/01/2012  . Cramp of limb 01/11/2011  . Disorder of bone and cartilage, unspecified 11/10/2010  . Dizziness and giddiness 11/10/2010  . Insomnia, unspecified 09/07/2010  . Knee pain, left anterior   . Lumbago 07/01/2009  . Lump or mass in breast 08/01/2012  . Memory loss 01/22/2012  . Myalgia and myositis, unspecified 05/17/2011  . Myopathy, unspecified   . Osteoarthrosis, unspecified whether generalized or localized, lower leg   . Other abnormal glucose 03/23/2010  . Other chest pain 08/28/2012  . Other disorders of bone and cartilage(733.99) 09/19/2012  . Overactive bladder   . Rash    inner legs- ? med allergy- to see PCP if doesnt improve by end of week  . Rash and nonspecific skin eruption 10/28/2014  . Right bundle branch block   . Seborrheic keratoses 05/17/2011  . Spasm of muscle 01/11/2011  . Spinal stenosis, lumbar region, without neurogenic claudication   . Stress incontinence 07/08/2014  . Unspecified constipation 12/18/2011  . Unspecified tinnitus   . Unspecified urinary incontinence   . Urgency of urination   .  Urinary frequency 09/18/2012   Past Surgical History:  Procedure Laterality Date  . ABDOMINAL HYSTERECTOMY    . APPENDECTOMY  1956  . BACK SURGERY     lumbar-- ? type  Dr Shelle Iron  . BREAST LUMPECTOMY  1993   breast cyst  . BREAST SURGERY     right lumpectomy  . CLEFT LIP REPAIR     with repair palate  . ESOPHAGOGASTRODUODENOSCOPY Left 03/10/2013   Procedure: ESOPHAGOGASTRODUODENOSCOPY (EGD);  Surgeon: Theda Belfast, MD;  Location: [facility];  Service: Endoscopy;  Laterality: Left;  . EYE SURGERY     bilateral cataract extraction with IOL  . TOTAL KNEE ARTHROPLASTY  12/13/2011   Procedure: TOTAL KNEE ARTHROPLASTY;  Surgeon: Javier Docker, MD;  Location: [facility];  Service: Orthopedics;  Laterality: Left;    No Known  Allergies  Allergies as of 04/15/2023   No Known Allergies      Medication List        Accurate as of April 15, 2023 11:59 PM. If you have any questions, ask your nurse or doctor.          STOP taking these medications    HYDROcodone-acetaminophen 5-325 MG tablet Commonly known as: NORCO/VICODIN Stopped by: Shivali Quackenbush X Tiny Chaudhary   memantine 10 MG tablet Commonly known as: NAMENDA Stopped by: Yidel Teuscher X De Jaworski   pantoprazole 40 MG tablet Commonly known as: Protonix Stopped by: Makylie Rivere X Masahiro Iglesia       TAKE these medications    acetaminophen 500 MG tablet Commonly known as: TYLENOL Take 500 mg by mouth 2 (two) times daily.   chlorhexidine 0.12 % solution Commonly known as: PERIDEX Use as directed in the mouth or throat 2 (two) times daily.  Give 1 application by mouth two times a day related to OTHER LESIONS OF ORAL MUCOSA (K13.79)   omeprazole 20 MG capsule Commonly known as: PRILOSEC Take 20 mg by mouth daily.   polyethylene glycol 17 g packet Commonly known as: MIRALAX / GLYCOLAX Take 17 g by mouth daily.   sodium fluoride 1.1 % Gel dental gel Commonly known as: FLUORISHIELD Place 1 Application onto teeth at bedtime.        Review of Systems  Unable to perform ROS: Dementia    Immunization History  Administered Date(s) Administered  . Influenza Whole 05/27/2012, 06/10/2013, 06/06/2018  . Influenza, High Dose Seasonal PF 05/30/2019, 06/25/2022  . Influenza-Unspecified 06/14/2014, 05/26/2015, 06/13/2016, 06/17/2017, 06/08/2020, 06/14/2021  . Moderna SARS-COV2 Booster Vaccination 01/12/2022, 06/28/2022  . Moderna Sars-Covid-2 Vaccination 08/29/2019, 09/26/2019, 07/05/2020, 01/24/2021  . Research officer, [employer] 82yrs & up 05/16/2021  . Pneumococcal Conjugate-13 06/24/2017  . Pneumococcal Polysaccharide-23 10/03/2011  . Td 10/03/2011  . Tdap 10/31/2021  . Zoster Recombinant(Shingrix) 10/31/2021, 01/31/2022  . Zoster, Live 08/28/2007   Pertinent   Health Maintenance Due  Topic Date Due  . INFLUENZA VACCINE  03/28/2023  . DEXA SCAN  Completed      09/15/2015    3:29 PM 05/17/2017    9:30 AM 03/27/2019    5:06 PM 05/29/2022    3:06 PM 02/25/2023   10:32 AM  Fall Risk  Falls in the past year? No No -- 0 0  Number of falls in past year - Comments   Emmi Telephone Survey Actual Response =     Was there an injury with Fall?    0 0  Fall Risk Category Calculator    0 0  Fall Risk Category (Retired)    Low   (  RETIRED) Patient Fall Risk Level    Low fall risk   Patient at Risk for Falls Due to    No Fall Risks No Fall Risks  Fall risk Follow up    Falls evaluation completed Falls evaluation completed   Functional Status Survey:    Vitals:   04/15/23 1133  BP: 108/86  Pulse: 84  Resp: 16  Temp: (!) 97.5 F (36.4 C)  SpO2: 95%  Weight: 146 lb 3.2 oz (66.3 kg)  Height: 5\' 6"  (1.676 m)   Body mass index is 23.6 kg/m. Physical Exam Vitals and nursing note reviewed.  Constitutional:      Comments: Examined in w/c, mumbles with eyes closed.   HENT:     Head: Normocephalic and atraumatic.     Mouth/Throat:     Mouth: Mucous membranes are moist.  Eyes:     Extraocular Movements: Extraocular movements intact.     Conjunctiva/sclera:     Right eye: Right conjunctiva is not injected.     Left eye: Left conjunctiva is not injected.     Pupils: Pupils are equal, round, and reactive to light.  Cardiovascular:     Rate and Rhythm: Normal rate and regular rhythm.     Heart sounds: No murmur heard. Pulmonary:     Effort: Pulmonary effort is normal.     Breath sounds: No rales.  Abdominal:     General: Bowel sounds are normal.     Palpations: Abdomen is soft.     Tenderness: There is no abdominal tenderness.  Musculoskeletal:     Cervical back: Normal range of motion and neck supple.     Right lower leg: No edema.     Left lower leg: No edema.     Comments: Ambulates with walker. Left knee pain when walking.   Skin:     General: Skin is warm and dry.  Neurological:     General: No focal deficit present.     Mental Status: She is alert. Mental status is at baseline.     Gait: Gait abnormal.     Comments: Oriented to self, her room. Ambulates with walker with SBA  Psychiatric:        Mood and Affect: Mood normal.        Behavior: Behavior normal.    Labs reviewed: Recent Labs    09/04/22 0000  NA 141  K 4.2  CL 104  CO2 30*  BUN 14  CREATININE 0.8  CALCIUM 9.7   Recent Labs    09/04/22 0000  AST 12*  ALT 9  ALKPHOS 114  ALBUMIN 3.8   Recent Labs    09/04/22 0000  WBC 6.5  NEUTROABS 4,258.00  HGB 15.0  HCT 45  PLT 300   Lab Results  Component Value Date   TSH 1.28 09/04/2022   Lab Results  Component Value Date   HGBA1C 5.6 04/24/2016   Lab Results  Component Value Date   CHOL 189 09/06/2015   HDL 65 09/06/2015   LDLCALC 99 09/06/2015   TRIG 126 09/06/2015    Significant Diagnostic Results in last 30 days:  No results found.  Assessment/Plan Fatigue the patient's fatigue, takes a few bites and refuses to eat, sleeps all day, denied pain, afebrile, no noted cough, SOB, indigestion, nausea, vomiting, constipation, or diarrhea. Gradual weight loss about #10Ibs in the past 4-5 months. TSH 1.28 09/04/22 Obtain CBC/diff, CMP/eGFR, TSH in am.   Dementia without behavioral disturbance (HCC) off Memantine for  memory. MMSE 20/30 09/10/19. TSH 1.28 09/04/22  Osteoarthritis, multiple sites  s/p L TKR 2013, w/c for mobility,  on Tylenol  Gait abnormality W/c for mobility mostly  Gastric erosions GERD/PUD/gastric erosions, stable, on Omeprazole,  Hgb 15 09/04/22  Slow transit constipation  stable    Family/ staff Communication: plan of care reviewed with the patient and charge nurse.   Labs/tests ordered:   CBC/diff, CMP/eGFR, TSH  Time spend 35 minutes.

## 2023-04-15 NOTE — Assessment & Plan Note (Signed)
off Memantine for memory. MMSE 20/30 09/10/19. TSH 1.28 09/04/22

## 2023-04-15 NOTE — Assessment & Plan Note (Signed)
s/p L TKR 2013, w/c for mobility,  on Tylenol

## 2023-04-15 NOTE — Assessment & Plan Note (Signed)
the patient's fatigue, takes a few bites and refuses to eat, sleeps all day, denied pain, afebrile, no noted cough, SOB, indigestion, nausea, vomiting, constipation, or diarrhea. Gradual weight loss about #10Ibs in the past 4-5 months. TSH 1.28 09/04/22 Obtain CBC/diff, CMP/eGFR, TSH in am.  04/16/23 wbc 7.9, Hgb 15.0, plt 303, neutrophils 81.8, Na 142, K 3.7, Bun 20, creat 0.83

## 2023-04-15 NOTE — Assessment & Plan Note (Signed)
GERD/PUD/gastric erosions, stable, on Omeprazole,  Hgb 15 09/04/22

## 2023-04-16 DIAGNOSIS — I10 Essential (primary) hypertension: Secondary | ICD-10-CM | POA: Diagnosis not present

## 2023-04-16 LAB — CBC AND DIFFERENTIAL
HCT: 45 (ref 36–46)
Hemoglobin: 15 (ref 12.0–16.0)
Neutrophils Absolute: 6462
Platelets: 303 10*3/uL (ref 150–400)
WBC: 7.9

## 2023-04-16 LAB — HEPATIC FUNCTION PANEL
ALT: 16 U/L (ref 7–35)
AST: 16 (ref 13–35)
Alkaline Phosphatase: 154 — AB (ref 25–125)
Bilirubin, Total: 0.6

## 2023-04-16 LAB — BASIC METABOLIC PANEL
BUN: 20 (ref 4–21)
CO2: 26 — AB (ref 13–22)
Chloride: 105 (ref 99–108)
Creatinine: 0.8 (ref 0.5–1.1)
Glucose: 185
Potassium: 3.7 meq/L (ref 3.5–5.1)
Sodium: 142 (ref 137–147)

## 2023-04-16 LAB — COMPREHENSIVE METABOLIC PANEL
Albumin: 3.8 (ref 3.5–5.0)
Calcium: 9.8 (ref 8.7–10.7)
Globulin: 3.1
eGFR: 67

## 2023-04-16 LAB — CBC: RBC: 5.02 (ref 3.87–5.11)

## 2023-05-03 DIAGNOSIS — N3281 Overactive bladder: Secondary | ICD-10-CM | POA: Diagnosis not present

## 2023-05-03 DIAGNOSIS — K219 Gastro-esophageal reflux disease without esophagitis: Secondary | ICD-10-CM | POA: Diagnosis not present

## 2023-05-03 DIAGNOSIS — K269 Duodenal ulcer, unspecified as acute or chronic, without hemorrhage or perforation: Secondary | ICD-10-CM | POA: Diagnosis not present

## 2023-05-03 DIAGNOSIS — R5383 Other fatigue: Secondary | ICD-10-CM | POA: Diagnosis not present

## 2023-05-03 DIAGNOSIS — R1312 Dysphagia, oropharyngeal phase: Secondary | ICD-10-CM | POA: Diagnosis not present

## 2023-05-03 DIAGNOSIS — M159 Polyosteoarthritis, unspecified: Secondary | ICD-10-CM | POA: Diagnosis not present

## 2023-05-03 DIAGNOSIS — F0150 Vascular dementia without behavioral disturbance: Secondary | ICD-10-CM | POA: Diagnosis not present

## 2023-05-03 DIAGNOSIS — R634 Abnormal weight loss: Secondary | ICD-10-CM | POA: Diagnosis not present

## 2023-05-03 DIAGNOSIS — I679 Cerebrovascular disease, unspecified: Secondary | ICD-10-CM | POA: Diagnosis not present

## 2023-05-03 DIAGNOSIS — K5900 Constipation, unspecified: Secondary | ICD-10-CM | POA: Diagnosis not present

## 2023-05-04 DIAGNOSIS — I679 Cerebrovascular disease, unspecified: Secondary | ICD-10-CM | POA: Diagnosis not present

## 2023-05-04 DIAGNOSIS — R1312 Dysphagia, oropharyngeal phase: Secondary | ICD-10-CM | POA: Diagnosis not present

## 2023-05-04 DIAGNOSIS — R634 Abnormal weight loss: Secondary | ICD-10-CM | POA: Diagnosis not present

## 2023-05-04 DIAGNOSIS — K5900 Constipation, unspecified: Secondary | ICD-10-CM | POA: Diagnosis not present

## 2023-05-04 DIAGNOSIS — F0150 Vascular dementia without behavioral disturbance: Secondary | ICD-10-CM | POA: Diagnosis not present

## 2023-05-04 DIAGNOSIS — K269 Duodenal ulcer, unspecified as acute or chronic, without hemorrhage or perforation: Secondary | ICD-10-CM | POA: Diagnosis not present

## 2023-05-05 DIAGNOSIS — F0150 Vascular dementia without behavioral disturbance: Secondary | ICD-10-CM | POA: Diagnosis not present

## 2023-05-05 DIAGNOSIS — I679 Cerebrovascular disease, unspecified: Secondary | ICD-10-CM | POA: Diagnosis not present

## 2023-05-05 DIAGNOSIS — R1312 Dysphagia, oropharyngeal phase: Secondary | ICD-10-CM | POA: Diagnosis not present

## 2023-05-05 DIAGNOSIS — K269 Duodenal ulcer, unspecified as acute or chronic, without hemorrhage or perforation: Secondary | ICD-10-CM | POA: Diagnosis not present

## 2023-05-05 DIAGNOSIS — R634 Abnormal weight loss: Secondary | ICD-10-CM | POA: Diagnosis not present

## 2023-05-05 DIAGNOSIS — K5900 Constipation, unspecified: Secondary | ICD-10-CM | POA: Diagnosis not present

## 2023-05-06 DIAGNOSIS — R634 Abnormal weight loss: Secondary | ICD-10-CM | POA: Diagnosis not present

## 2023-05-06 DIAGNOSIS — K5900 Constipation, unspecified: Secondary | ICD-10-CM | POA: Diagnosis not present

## 2023-05-06 DIAGNOSIS — I679 Cerebrovascular disease, unspecified: Secondary | ICD-10-CM | POA: Diagnosis not present

## 2023-05-06 DIAGNOSIS — R1312 Dysphagia, oropharyngeal phase: Secondary | ICD-10-CM | POA: Diagnosis not present

## 2023-05-06 DIAGNOSIS — F0150 Vascular dementia without behavioral disturbance: Secondary | ICD-10-CM | POA: Diagnosis not present

## 2023-05-06 DIAGNOSIS — K269 Duodenal ulcer, unspecified as acute or chronic, without hemorrhage or perforation: Secondary | ICD-10-CM | POA: Diagnosis not present

## 2023-05-07 DIAGNOSIS — K269 Duodenal ulcer, unspecified as acute or chronic, without hemorrhage or perforation: Secondary | ICD-10-CM | POA: Diagnosis not present

## 2023-05-07 DIAGNOSIS — R1312 Dysphagia, oropharyngeal phase: Secondary | ICD-10-CM | POA: Diagnosis not present

## 2023-05-07 DIAGNOSIS — F0150 Vascular dementia without behavioral disturbance: Secondary | ICD-10-CM | POA: Diagnosis not present

## 2023-05-07 DIAGNOSIS — I679 Cerebrovascular disease, unspecified: Secondary | ICD-10-CM | POA: Diagnosis not present

## 2023-05-07 DIAGNOSIS — K5900 Constipation, unspecified: Secondary | ICD-10-CM | POA: Diagnosis not present

## 2023-05-07 DIAGNOSIS — R634 Abnormal weight loss: Secondary | ICD-10-CM | POA: Diagnosis not present

## 2023-05-08 DIAGNOSIS — F0150 Vascular dementia without behavioral disturbance: Secondary | ICD-10-CM | POA: Diagnosis not present

## 2023-05-08 DIAGNOSIS — K269 Duodenal ulcer, unspecified as acute or chronic, without hemorrhage or perforation: Secondary | ICD-10-CM | POA: Diagnosis not present

## 2023-05-08 DIAGNOSIS — R1312 Dysphagia, oropharyngeal phase: Secondary | ICD-10-CM | POA: Diagnosis not present

## 2023-05-08 DIAGNOSIS — R634 Abnormal weight loss: Secondary | ICD-10-CM | POA: Diagnosis not present

## 2023-05-08 DIAGNOSIS — K5900 Constipation, unspecified: Secondary | ICD-10-CM | POA: Diagnosis not present

## 2023-05-08 DIAGNOSIS — I679 Cerebrovascular disease, unspecified: Secondary | ICD-10-CM | POA: Diagnosis not present

## 2023-05-09 DIAGNOSIS — K5900 Constipation, unspecified: Secondary | ICD-10-CM | POA: Diagnosis not present

## 2023-05-09 DIAGNOSIS — R634 Abnormal weight loss: Secondary | ICD-10-CM | POA: Diagnosis not present

## 2023-05-09 DIAGNOSIS — I679 Cerebrovascular disease, unspecified: Secondary | ICD-10-CM | POA: Diagnosis not present

## 2023-05-09 DIAGNOSIS — R1312 Dysphagia, oropharyngeal phase: Secondary | ICD-10-CM | POA: Diagnosis not present

## 2023-05-09 DIAGNOSIS — K269 Duodenal ulcer, unspecified as acute or chronic, without hemorrhage or perforation: Secondary | ICD-10-CM | POA: Diagnosis not present

## 2023-05-09 DIAGNOSIS — F0150 Vascular dementia without behavioral disturbance: Secondary | ICD-10-CM | POA: Diagnosis not present

## 2023-05-10 ENCOUNTER — Encounter: Payer: Self-pay | Admitting: Sports Medicine

## 2023-05-10 ENCOUNTER — Non-Acute Institutional Stay (SKILLED_NURSING_FACILITY): Payer: Self-pay | Admitting: Sports Medicine

## 2023-05-10 DIAGNOSIS — K5901 Slow transit constipation: Secondary | ICD-10-CM

## 2023-05-10 DIAGNOSIS — R1312 Dysphagia, oropharyngeal phase: Secondary | ICD-10-CM | POA: Diagnosis not present

## 2023-05-10 DIAGNOSIS — F0150 Vascular dementia without behavioral disturbance: Secondary | ICD-10-CM | POA: Diagnosis not present

## 2023-05-10 DIAGNOSIS — R634 Abnormal weight loss: Secondary | ICD-10-CM

## 2023-05-10 DIAGNOSIS — F0390 Unspecified dementia without behavioral disturbance: Secondary | ICD-10-CM

## 2023-05-10 DIAGNOSIS — Z515 Encounter for palliative care: Secondary | ICD-10-CM

## 2023-05-10 DIAGNOSIS — K5900 Constipation, unspecified: Secondary | ICD-10-CM | POA: Diagnosis not present

## 2023-05-10 DIAGNOSIS — K269 Duodenal ulcer, unspecified as acute or chronic, without hemorrhage or perforation: Secondary | ICD-10-CM | POA: Diagnosis not present

## 2023-05-10 DIAGNOSIS — I679 Cerebrovascular disease, unspecified: Secondary | ICD-10-CM | POA: Diagnosis not present

## 2023-05-10 NOTE — Progress Notes (Signed)
Provider:   Venita Sheffield Location:   [facility] [name]   Place of Service:   SNF [address]  PCP: Mast, Man X, NP Patient Care Team: Mast, Man X, NP as PCP - General (Internal Medicine) [name] Mast, Man X, NP as Nurse Practitioner (Nurse Practitioner) [name], MD as Attending Physician (Urology) [name], MD as Consulting Physician (Gastroenterology)  Extended Emergency Contact Information Primary Emergency Contact: Eldredge,[name] Address: [address]          [name] [address] [name] Home Phone: [phone] Relation: Son  Code Status:  Goals of Care: Advanced Directive information    04/15/2023   11:38 AM  Advanced Directives  Does Patient Have a Medical Advance Directive? Yes  Type of Advance Directive Living will;Healthcare Power of [place];Out of facility DNR (pink MOST or yellow form)  Does patient want to make changes to medical advance directive? No - Patient declined  Copy of Healthcare Power of Attorney in Chart? Yes - validated most recent copy scanned in chart (See row information)      No chief complaint on file.   HPI: Patient is a 89 y.o. female seen today for a routine visit   Pt is on hospice. Seen and examined in her room, she is sleeping in her bed comfortably. She opens her eyes when called her name. She can tell me her name , her speech is difficult to understand , she mumbles.  Pt needs to be fed, she is eating little. She can pivot but cannot transfer herself. Pt goes to sleep during my examination, she smiles intermittently  Does not seem to be in distress.  Needs assistance with all her ADLS     Past Medical History:  Diagnosis Date   Allergic rhinitis due to pollen 07/01/2009   Anemia, unspecified 09/07/2010   Arthritis    Benign paroxysmal positional vertigo    Candidiasis 01/01/2012   Cramp of limb 01/11/2011   Disorder of bone and cartilage, unspecified 11/10/2010    Dizziness and giddiness 11/10/2010   Insomnia, unspecified 09/07/2010   Knee pain, left anterior    Lumbago 07/01/2009   Lump or mass in breast 08/01/2012   Memory loss 01/22/2012   Myalgia and myositis, unspecified 05/17/2011   Myopathy, unspecified    Osteoarthrosis, unspecified whether generalized or localized, lower leg    Other abnormal glucose 03/23/2010   Other chest pain 08/28/2012   Other disorders of bone and cartilage(733.99) 09/19/2012   Overactive bladder    Rash    inner legs- ? med allergy- to see PCP if doesnt improve by end of week   Rash and nonspecific skin eruption 10/28/2014   Right bundle branch block    Seborrheic keratoses 05/17/2011   Spasm of muscle 01/11/2011   Spinal stenosis, lumbar region, without neurogenic claudication    Stress incontinence 07/08/2014   Unspecified constipation 12/18/2011   Unspecified tinnitus    Unspecified urinary incontinence    Urgency of urination    Urinary frequency 09/18/2012   Past Surgical History:  Procedure Laterality Date   ABDOMINAL HYSTERECTOMY     APPENDECTOMY  1956   BACK SURGERY     lumbar-- ? type  Dr Shelle Iron   BREAST LUMPECTOMY  1993   breast cyst   BREAST SURGERY     right lumpectomy   CLEFT LIP REPAIR     with repair palate   ESOPHAGOGASTRODUODENOSCOPY Left 03/10/2013   Procedure: ESOPHAGOGASTRODUODENOSCOPY (EGD);  Surgeon:  Theda Belfast, MD;  Location: [facility];  Service: Endoscopy;  Laterality: Left;   EYE SURGERY     bilateral cataract extraction with IOL   TOTAL KNEE ARTHROPLASTY  12/13/2011   Procedure: TOTAL KNEE ARTHROPLASTY;  Surgeon: Javier Docker, MD;  Location: [facility];  Service: Orthopedics;  Laterality: Left;    reports that she has never smoked. She has never used smokeless tobacco. She reports that she does not drink alcohol and does not use drugs. Social History   Socioeconomic History   Marital status: Divorced    Spouse name: Not on file   Number of children: Not on file   Years of  education: Not on file   Highest education level: Not on file  Occupational History   Occupation: retired Buyer, retail  Tobacco Use   Smoking status: Never   Smokeless tobacco: Never  Substance and Sexual Activity   Alcohol use: No    Comment: socially   wine   Drug use: No   Sexual activity: Never    Birth control/protection: Post-menopausal  Other Topics Concern   Not on file  Social History Narrative   Lives at [facility] [name] since 08/2007   Divorce    Never smoked   Alcohol -rare   Exercise none    Living Will   Social Determinants of Health   Financial Resource Strain: Not on file  Food Insecurity: Not on file  Transportation Needs: Not on file  Physical Activity: Not on file  Stress: Not on file  Social Connections: Not on file  Intimate Partner Violence: Not on file    Functional Status Survey:    History reviewed. No pertinent family history.  Health Maintenance  Topic Date Due   INFLUENZA VACCINE  03/28/2023   COVID-19 Vaccine (6 - 2023-24 season) 04/28/2023   DTaP/Tdap/Td (3 - Td or Tdap) 11/01/2031   Pneumonia Vaccine 39+ Years old  Completed   DEXA SCAN  Completed   Zoster Vaccines- Shingrix  Completed   HPV VACCINES  Aged Out    No Known Allergies  Outpatient Encounter Medications as of 05/10/2023  Medication Sig   acetaminophen (TYLENOL) 500 MG tablet Take 500 mg by mouth 2 (two) times daily.   chlorhexidine (PERIDEX) 0.12 % solution Use as directed in the mouth or throat 2 (two) times daily.  Give 1 application by mouth two times a day related to OTHER LESIONS OF ORAL MUCOSA (K13.79)   omeprazole (PRILOSEC) 20 MG capsule Take 20 mg by mouth daily.   polyethylene glycol (MIRALAX / GLYCOLAX) 17 g packet Take 17 g by mouth daily.   sodium fluoride (FLUORISHIELD) 1.1 % GEL dental gel Place 1 Application onto teeth at bedtime.   No facility-administered encounter medications on file as of 05/10/2023.    Review of Systems  Unable to perform  ROS: Dementia  Constitutional:  Negative for fever.  Respiratory:  Negative for chest tightness.   Gastrointestinal:  Negative for diarrhea.  Genitourinary:  Negative for hematuria.  Psychiatric/Behavioral:  Positive for confusion.     There were no vitals filed for this visit. There is no height or weight on file to calculate BMI. Physical Exam Constitutional:      Appearance: Normal appearance.  Cardiovascular:     Rate and Rhythm: Normal rate.     Pulses: Normal pulses.     Heart sounds: Normal heart sounds.  Pulmonary:     Effort: Pulmonary effort is normal. No respiratory distress.  Breath sounds: Normal breath sounds. No wheezing or rales.  Abdominal:     General: There is no distension.     Tenderness: There is no abdominal tenderness. There is no guarding.  Neurological:     Mental Status: She is alert. Mental status is at baseline.     Labs reviewed: Basic Metabolic Panel: Recent Labs    09/04/22 0000  NA 141  K 4.2  CL 104  CO2 30*  BUN 14  CREATININE 0.8  CALCIUM 9.7   Liver Function Tests: Recent Labs    09/04/22 0000  AST 12*  ALT 9  ALKPHOS 114  ALBUMIN 3.8   No results for input(s): "LIPASE", "AMYLASE" in the last 8760 hours. No results for input(s): "AMMONIA" in the last 8760 hours. CBC: Recent Labs    09/04/22 0000  WBC 6.5  NEUTROABS 4,258.00  HGB 15.0  HCT 45  PLT 300   Cardiac Enzymes: No results for input(s): "CKTOTAL", "CKMB", "CKMBINDEX", "TROPONINI" in the last 8760 hours. BNP: Invalid input(s): "POCBNP" Lab Results  Component Value Date   HGBA1C 5.6 04/24/2016   Lab Results  Component Value Date   TSH 1.28 09/04/2022   Lab Results  Component Value Date   VITAMINB12 365 12/19/2017   No results found for: "FOLATE" No results found for: "IRON", "TIBC", "FERRITIN"  Imaging and Procedures obtained prior to SNF admission: NM Renal Imaging Flow W/Pharm  Result Date: 04/06/2013 *RADIOLOGY REPORT* Clinical Data:   Right-sided hydronephrosis on CT. NUCLEAR MEDICINE RENAL SCAN WITH DIURETIC ADMINISTRATION Technique:  Radionuclide angiographic and sequential renal images were obtained after intravenous injection of radiopharmaceutical. Imaging was continued during slow intravenous injection of Lasix approximately 15 minutes after the start of the examination. Radiopharmaceutical:  16 mCi Tc-42m MAG3 Comparison:  Renal ultrasound 09/23/2012.  Abdominal pelvic CT 03/10/2013. Findings: Perfusion images demonstrate early symmetric perfusion to the kidneys.  Differential renal uptake at 2-3 minutes is 50.5% on the right and 49.5% on the left. Renogram images demonstrate normal renal cortical uptake and excretion bilaterally.  There is mild to moderate dilatation of the right renal pelvis with retained contrast prior to Lasix administration.  However, this contrast washes out following Lasix. There is no significant retained contrast within the collecting system post voiding. The right renogram curve is downsloping following Lasix. Differential renal function is 50.2% right and 49.8% left.  Post Lasix T 1/2 times are 3.5 minutes on the right and 6.0 minutes on the left. IMPRESSION: 1.  Dilatation of the right renal pelvis with good washout of activity following Lasix.  No significant ureteral obstruction demonstrated. 2.  Differential renal function is nearly symmetric. Original Report Authenticated By: Carey Bullocks, M.D.    Assessment/ Plan 1. Hospice care patient Cont with supportive care  Pt does not seem to be in pain   2. Dementia without behavioral disturbance (HCC) No behavioral changes  3. Weight loss Probably due to progression of her dementia  4. Slow transit constipation Cont with bowel regimen   Labs/tests ordered:

## 2023-05-11 DIAGNOSIS — K5900 Constipation, unspecified: Secondary | ICD-10-CM | POA: Diagnosis not present

## 2023-05-11 DIAGNOSIS — K269 Duodenal ulcer, unspecified as acute or chronic, without hemorrhage or perforation: Secondary | ICD-10-CM | POA: Diagnosis not present

## 2023-05-11 DIAGNOSIS — R1312 Dysphagia, oropharyngeal phase: Secondary | ICD-10-CM | POA: Diagnosis not present

## 2023-05-11 DIAGNOSIS — R634 Abnormal weight loss: Secondary | ICD-10-CM | POA: Diagnosis not present

## 2023-05-11 DIAGNOSIS — I679 Cerebrovascular disease, unspecified: Secondary | ICD-10-CM | POA: Diagnosis not present

## 2023-05-11 DIAGNOSIS — F0150 Vascular dementia without behavioral disturbance: Secondary | ICD-10-CM | POA: Diagnosis not present

## 2023-05-12 DIAGNOSIS — R1312 Dysphagia, oropharyngeal phase: Secondary | ICD-10-CM | POA: Diagnosis not present

## 2023-05-12 DIAGNOSIS — I679 Cerebrovascular disease, unspecified: Secondary | ICD-10-CM | POA: Diagnosis not present

## 2023-05-12 DIAGNOSIS — R634 Abnormal weight loss: Secondary | ICD-10-CM | POA: Diagnosis not present

## 2023-05-12 DIAGNOSIS — K269 Duodenal ulcer, unspecified as acute or chronic, without hemorrhage or perforation: Secondary | ICD-10-CM | POA: Diagnosis not present

## 2023-05-12 DIAGNOSIS — F0150 Vascular dementia without behavioral disturbance: Secondary | ICD-10-CM | POA: Diagnosis not present

## 2023-05-12 DIAGNOSIS — K5900 Constipation, unspecified: Secondary | ICD-10-CM | POA: Diagnosis not present

## 2023-05-13 DIAGNOSIS — R1312 Dysphagia, oropharyngeal phase: Secondary | ICD-10-CM | POA: Diagnosis not present

## 2023-05-13 DIAGNOSIS — F0150 Vascular dementia without behavioral disturbance: Secondary | ICD-10-CM | POA: Diagnosis not present

## 2023-05-13 DIAGNOSIS — K5900 Constipation, unspecified: Secondary | ICD-10-CM | POA: Diagnosis not present

## 2023-05-13 DIAGNOSIS — F0390 Unspecified dementia without behavioral disturbance: Secondary | ICD-10-CM | POA: Diagnosis not present

## 2023-05-13 DIAGNOSIS — R634 Abnormal weight loss: Secondary | ICD-10-CM | POA: Diagnosis not present

## 2023-05-13 DIAGNOSIS — K269 Duodenal ulcer, unspecified as acute or chronic, without hemorrhage or perforation: Secondary | ICD-10-CM | POA: Diagnosis not present

## 2023-05-13 DIAGNOSIS — I679 Cerebrovascular disease, unspecified: Secondary | ICD-10-CM | POA: Diagnosis not present

## 2023-05-14 DIAGNOSIS — I679 Cerebrovascular disease, unspecified: Secondary | ICD-10-CM | POA: Diagnosis not present

## 2023-05-14 DIAGNOSIS — F0150 Vascular dementia without behavioral disturbance: Secondary | ICD-10-CM | POA: Diagnosis not present

## 2023-05-14 DIAGNOSIS — K5900 Constipation, unspecified: Secondary | ICD-10-CM | POA: Diagnosis not present

## 2023-05-14 DIAGNOSIS — R634 Abnormal weight loss: Secondary | ICD-10-CM | POA: Diagnosis not present

## 2023-05-14 DIAGNOSIS — R1312 Dysphagia, oropharyngeal phase: Secondary | ICD-10-CM | POA: Diagnosis not present

## 2023-05-14 DIAGNOSIS — F0390 Unspecified dementia without behavioral disturbance: Secondary | ICD-10-CM | POA: Diagnosis not present

## 2023-05-14 DIAGNOSIS — K269 Duodenal ulcer, unspecified as acute or chronic, without hemorrhage or perforation: Secondary | ICD-10-CM | POA: Diagnosis not present

## 2023-05-15 DIAGNOSIS — I679 Cerebrovascular disease, unspecified: Secondary | ICD-10-CM | POA: Diagnosis not present

## 2023-05-15 DIAGNOSIS — R634 Abnormal weight loss: Secondary | ICD-10-CM | POA: Diagnosis not present

## 2023-05-15 DIAGNOSIS — K269 Duodenal ulcer, unspecified as acute or chronic, without hemorrhage or perforation: Secondary | ICD-10-CM | POA: Diagnosis not present

## 2023-05-15 DIAGNOSIS — R1312 Dysphagia, oropharyngeal phase: Secondary | ICD-10-CM | POA: Diagnosis not present

## 2023-05-15 DIAGNOSIS — K5900 Constipation, unspecified: Secondary | ICD-10-CM | POA: Diagnosis not present

## 2023-05-15 DIAGNOSIS — F0150 Vascular dementia without behavioral disturbance: Secondary | ICD-10-CM | POA: Diagnosis not present

## 2023-05-16 DIAGNOSIS — R1312 Dysphagia, oropharyngeal phase: Secondary | ICD-10-CM | POA: Diagnosis not present

## 2023-05-16 DIAGNOSIS — F0150 Vascular dementia without behavioral disturbance: Secondary | ICD-10-CM | POA: Diagnosis not present

## 2023-05-16 DIAGNOSIS — R634 Abnormal weight loss: Secondary | ICD-10-CM | POA: Diagnosis not present

## 2023-05-16 DIAGNOSIS — I679 Cerebrovascular disease, unspecified: Secondary | ICD-10-CM | POA: Diagnosis not present

## 2023-05-16 DIAGNOSIS — K5900 Constipation, unspecified: Secondary | ICD-10-CM | POA: Diagnosis not present

## 2023-05-16 DIAGNOSIS — K269 Duodenal ulcer, unspecified as acute or chronic, without hemorrhage or perforation: Secondary | ICD-10-CM | POA: Diagnosis not present

## 2023-05-17 DIAGNOSIS — F0390 Unspecified dementia without behavioral disturbance: Secondary | ICD-10-CM | POA: Diagnosis not present

## 2023-05-17 DIAGNOSIS — R634 Abnormal weight loss: Secondary | ICD-10-CM | POA: Diagnosis not present

## 2023-05-17 DIAGNOSIS — F0150 Vascular dementia without behavioral disturbance: Secondary | ICD-10-CM | POA: Diagnosis not present

## 2023-05-17 DIAGNOSIS — K269 Duodenal ulcer, unspecified as acute or chronic, without hemorrhage or perforation: Secondary | ICD-10-CM | POA: Diagnosis not present

## 2023-05-17 DIAGNOSIS — I679 Cerebrovascular disease, unspecified: Secondary | ICD-10-CM | POA: Diagnosis not present

## 2023-05-17 DIAGNOSIS — K5900 Constipation, unspecified: Secondary | ICD-10-CM | POA: Diagnosis not present

## 2023-05-17 DIAGNOSIS — R1312 Dysphagia, oropharyngeal phase: Secondary | ICD-10-CM | POA: Diagnosis not present

## 2023-05-18 DIAGNOSIS — K5900 Constipation, unspecified: Secondary | ICD-10-CM | POA: Diagnosis not present

## 2023-05-18 DIAGNOSIS — F0150 Vascular dementia without behavioral disturbance: Secondary | ICD-10-CM | POA: Diagnosis not present

## 2023-05-18 DIAGNOSIS — R634 Abnormal weight loss: Secondary | ICD-10-CM | POA: Diagnosis not present

## 2023-05-18 DIAGNOSIS — K269 Duodenal ulcer, unspecified as acute or chronic, without hemorrhage or perforation: Secondary | ICD-10-CM | POA: Diagnosis not present

## 2023-05-18 DIAGNOSIS — R1312 Dysphagia, oropharyngeal phase: Secondary | ICD-10-CM | POA: Diagnosis not present

## 2023-05-18 DIAGNOSIS — I679 Cerebrovascular disease, unspecified: Secondary | ICD-10-CM | POA: Diagnosis not present

## 2023-05-19 DIAGNOSIS — R1312 Dysphagia, oropharyngeal phase: Secondary | ICD-10-CM | POA: Diagnosis not present

## 2023-05-19 DIAGNOSIS — R634 Abnormal weight loss: Secondary | ICD-10-CM | POA: Diagnosis not present

## 2023-05-19 DIAGNOSIS — I679 Cerebrovascular disease, unspecified: Secondary | ICD-10-CM | POA: Diagnosis not present

## 2023-05-19 DIAGNOSIS — F0150 Vascular dementia without behavioral disturbance: Secondary | ICD-10-CM | POA: Diagnosis not present

## 2023-05-19 DIAGNOSIS — K5900 Constipation, unspecified: Secondary | ICD-10-CM | POA: Diagnosis not present

## 2023-05-19 DIAGNOSIS — K269 Duodenal ulcer, unspecified as acute or chronic, without hemorrhage or perforation: Secondary | ICD-10-CM | POA: Diagnosis not present

## 2023-05-20 DIAGNOSIS — I679 Cerebrovascular disease, unspecified: Secondary | ICD-10-CM | POA: Diagnosis not present

## 2023-05-20 DIAGNOSIS — R634 Abnormal weight loss: Secondary | ICD-10-CM | POA: Diagnosis not present

## 2023-05-20 DIAGNOSIS — K269 Duodenal ulcer, unspecified as acute or chronic, without hemorrhage or perforation: Secondary | ICD-10-CM | POA: Diagnosis not present

## 2023-05-20 DIAGNOSIS — K5900 Constipation, unspecified: Secondary | ICD-10-CM | POA: Diagnosis not present

## 2023-05-20 DIAGNOSIS — R1312 Dysphagia, oropharyngeal phase: Secondary | ICD-10-CM | POA: Diagnosis not present

## 2023-05-20 DIAGNOSIS — F0150 Vascular dementia without behavioral disturbance: Secondary | ICD-10-CM | POA: Diagnosis not present

## 2023-05-21 DIAGNOSIS — F0150 Vascular dementia without behavioral disturbance: Secondary | ICD-10-CM | POA: Diagnosis not present

## 2023-05-21 DIAGNOSIS — R1312 Dysphagia, oropharyngeal phase: Secondary | ICD-10-CM | POA: Diagnosis not present

## 2023-05-21 DIAGNOSIS — R634 Abnormal weight loss: Secondary | ICD-10-CM | POA: Diagnosis not present

## 2023-05-21 DIAGNOSIS — K269 Duodenal ulcer, unspecified as acute or chronic, without hemorrhage or perforation: Secondary | ICD-10-CM | POA: Diagnosis not present

## 2023-05-21 DIAGNOSIS — K5900 Constipation, unspecified: Secondary | ICD-10-CM | POA: Diagnosis not present

## 2023-05-21 DIAGNOSIS — F0390 Unspecified dementia without behavioral disturbance: Secondary | ICD-10-CM | POA: Diagnosis not present

## 2023-05-21 DIAGNOSIS — I679 Cerebrovascular disease, unspecified: Secondary | ICD-10-CM | POA: Diagnosis not present

## 2023-05-22 DIAGNOSIS — R634 Abnormal weight loss: Secondary | ICD-10-CM | POA: Diagnosis not present

## 2023-05-22 DIAGNOSIS — K5900 Constipation, unspecified: Secondary | ICD-10-CM | POA: Diagnosis not present

## 2023-05-22 DIAGNOSIS — K269 Duodenal ulcer, unspecified as acute or chronic, without hemorrhage or perforation: Secondary | ICD-10-CM | POA: Diagnosis not present

## 2023-05-22 DIAGNOSIS — I679 Cerebrovascular disease, unspecified: Secondary | ICD-10-CM | POA: Diagnosis not present

## 2023-05-22 DIAGNOSIS — R1312 Dysphagia, oropharyngeal phase: Secondary | ICD-10-CM | POA: Diagnosis not present

## 2023-05-22 DIAGNOSIS — F0390 Unspecified dementia without behavioral disturbance: Secondary | ICD-10-CM | POA: Diagnosis not present

## 2023-05-22 DIAGNOSIS — F0150 Vascular dementia without behavioral disturbance: Secondary | ICD-10-CM | POA: Diagnosis not present

## 2023-05-23 DIAGNOSIS — I679 Cerebrovascular disease, unspecified: Secondary | ICD-10-CM | POA: Diagnosis not present

## 2023-05-23 DIAGNOSIS — F0390 Unspecified dementia without behavioral disturbance: Secondary | ICD-10-CM | POA: Diagnosis not present

## 2023-05-23 DIAGNOSIS — R1312 Dysphagia, oropharyngeal phase: Secondary | ICD-10-CM | POA: Diagnosis not present

## 2023-05-23 DIAGNOSIS — F0150 Vascular dementia without behavioral disturbance: Secondary | ICD-10-CM | POA: Diagnosis not present

## 2023-05-23 DIAGNOSIS — K269 Duodenal ulcer, unspecified as acute or chronic, without hemorrhage or perforation: Secondary | ICD-10-CM | POA: Diagnosis not present

## 2023-05-23 DIAGNOSIS — K5900 Constipation, unspecified: Secondary | ICD-10-CM | POA: Diagnosis not present

## 2023-05-23 DIAGNOSIS — R634 Abnormal weight loss: Secondary | ICD-10-CM | POA: Diagnosis not present

## 2023-05-24 DIAGNOSIS — I679 Cerebrovascular disease, unspecified: Secondary | ICD-10-CM | POA: Diagnosis not present

## 2023-05-24 DIAGNOSIS — R1312 Dysphagia, oropharyngeal phase: Secondary | ICD-10-CM | POA: Diagnosis not present

## 2023-05-24 DIAGNOSIS — K5900 Constipation, unspecified: Secondary | ICD-10-CM | POA: Diagnosis not present

## 2023-05-24 DIAGNOSIS — R634 Abnormal weight loss: Secondary | ICD-10-CM | POA: Diagnosis not present

## 2023-05-24 DIAGNOSIS — K269 Duodenal ulcer, unspecified as acute or chronic, without hemorrhage or perforation: Secondary | ICD-10-CM | POA: Diagnosis not present

## 2023-05-24 DIAGNOSIS — F0150 Vascular dementia without behavioral disturbance: Secondary | ICD-10-CM | POA: Diagnosis not present

## 2023-05-25 DIAGNOSIS — K269 Duodenal ulcer, unspecified as acute or chronic, without hemorrhage or perforation: Secondary | ICD-10-CM | POA: Diagnosis not present

## 2023-05-25 DIAGNOSIS — F0150 Vascular dementia without behavioral disturbance: Secondary | ICD-10-CM | POA: Diagnosis not present

## 2023-05-25 DIAGNOSIS — I679 Cerebrovascular disease, unspecified: Secondary | ICD-10-CM | POA: Diagnosis not present

## 2023-05-25 DIAGNOSIS — K5900 Constipation, unspecified: Secondary | ICD-10-CM | POA: Diagnosis not present

## 2023-05-25 DIAGNOSIS — R1312 Dysphagia, oropharyngeal phase: Secondary | ICD-10-CM | POA: Diagnosis not present

## 2023-05-25 DIAGNOSIS — R634 Abnormal weight loss: Secondary | ICD-10-CM | POA: Diagnosis not present

## 2023-05-26 DIAGNOSIS — I679 Cerebrovascular disease, unspecified: Secondary | ICD-10-CM | POA: Diagnosis not present

## 2023-05-26 DIAGNOSIS — K5900 Constipation, unspecified: Secondary | ICD-10-CM | POA: Diagnosis not present

## 2023-05-26 DIAGNOSIS — K269 Duodenal ulcer, unspecified as acute or chronic, without hemorrhage or perforation: Secondary | ICD-10-CM | POA: Diagnosis not present

## 2023-05-26 DIAGNOSIS — R634 Abnormal weight loss: Secondary | ICD-10-CM | POA: Diagnosis not present

## 2023-05-26 DIAGNOSIS — R1312 Dysphagia, oropharyngeal phase: Secondary | ICD-10-CM | POA: Diagnosis not present

## 2023-05-26 DIAGNOSIS — F0150 Vascular dementia without behavioral disturbance: Secondary | ICD-10-CM | POA: Diagnosis not present

## 2023-05-27 ENCOUNTER — Non-Acute Institutional Stay (SKILLED_NURSING_FACILITY): Payer: Medicare Other | Admitting: Nurse Practitioner

## 2023-05-27 ENCOUNTER — Encounter: Payer: Self-pay | Admitting: Nurse Practitioner

## 2023-05-27 DIAGNOSIS — F0390 Unspecified dementia without behavioral disturbance: Secondary | ICD-10-CM | POA: Diagnosis not present

## 2023-05-27 DIAGNOSIS — M159 Polyosteoarthritis, unspecified: Secondary | ICD-10-CM | POA: Diagnosis not present

## 2023-05-27 DIAGNOSIS — R627 Adult failure to thrive: Secondary | ICD-10-CM | POA: Diagnosis not present

## 2023-05-27 DIAGNOSIS — R269 Unspecified abnormalities of gait and mobility: Secondary | ICD-10-CM

## 2023-05-27 DIAGNOSIS — K269 Duodenal ulcer, unspecified as acute or chronic, without hemorrhage or perforation: Secondary | ICD-10-CM

## 2023-05-27 DIAGNOSIS — R634 Abnormal weight loss: Secondary | ICD-10-CM | POA: Diagnosis not present

## 2023-05-27 DIAGNOSIS — I679 Cerebrovascular disease, unspecified: Secondary | ICD-10-CM | POA: Diagnosis not present

## 2023-05-27 DIAGNOSIS — M150 Primary generalized (osteo)arthritis: Secondary | ICD-10-CM

## 2023-05-27 DIAGNOSIS — R1312 Dysphagia, oropharyngeal phase: Secondary | ICD-10-CM | POA: Diagnosis not present

## 2023-05-27 DIAGNOSIS — F0150 Vascular dementia without behavioral disturbance: Secondary | ICD-10-CM | POA: Diagnosis not present

## 2023-05-27 DIAGNOSIS — K5900 Constipation, unspecified: Secondary | ICD-10-CM | POA: Diagnosis not present

## 2023-05-27 NOTE — Assessment & Plan Note (Signed)
under hospice service for comfort measures.

## 2023-05-27 NOTE — Assessment & Plan Note (Signed)
W/c for mobility °

## 2023-05-27 NOTE — Assessment & Plan Note (Signed)
GERD/PUD/gastric erosions, stable, on Omeprazole,  Hgb 15 09/04/22

## 2023-05-27 NOTE — Assessment & Plan Note (Addendum)
s/p L TKR 2013,  on Tylenol, chronic left knee pain is less since no longer ambulatory.

## 2023-05-27 NOTE — Progress Notes (Signed)
Location:   S[facility] Room Number: [id number] Place of Service:  SNF (31) Provider: Arna Snipe Cleatus Goodin NP  Kyndahl Jablon X, NP  Patient Care Team: Shawni Volkov X, NP as PCP - General (Internal Medicine) [name] Marshia Tropea X, NP as Nurse Practitioner (Nurse Practitioner) [name], MD as Attending Physician (Urology) [name], MD as Consulting Physician (Gastroenterology)  Extended Emergency Contact Information Primary Emergency Contact: Broome,[name] Address: [address]          [name] [address] [name] Home Phone: [phone] Relation: Son  Code Status:  DNR Goals of care: Advanced Directive information    04/15/2023   11:38 AM  Advanced Directives  Does Patient Have a Medical Advance Directive? Yes  Type of Advance Directive Living will;Healthcare Power of [place];Out of facility DNR (pink MOST or yellow form)  Does patient want to make changes to medical advance directive? No - Patient declined  Copy of Healthcare Power of Attorney in Chart? Yes - validated most recent copy scanned in chart (See row information)     Chief Complaint  Patient presents with   Medical Management of Chronic Issues    HPI:  Pt is a 89 y.o. female seen today for medical management of chronic diseases.    Failure to thrive, under hospice service for comfort measures.     Dementia, off Memantine for memory. MMSE 20/30 09/10/19. TSH 1.28 09/04/22             Hx of OA, s/p L TKR 2013,  on Tylenol, w/c for mobility              Gait abnormalit, w/c for mobility             GERD/PUD/gastric erosions, stable, on Omeprazole,  Hgb 15 09/04/22             Urinary frequency, not taking meds             Constipation, stable             BPPV no recent occurrence.   Past Medical History:  Diagnosis Date   Allergic rhinitis due to pollen 07/01/2009   Anemia, unspecified 09/07/2010   Arthritis    Benign paroxysmal positional vertigo    Candidiasis 01/01/2012    Cramp of limb 01/11/2011   Disorder of bone and cartilage, unspecified 11/10/2010   Dizziness and giddiness 11/10/2010   Insomnia, unspecified 09/07/2010   Knee pain, left anterior    Lumbago 07/01/2009   Lump or mass in breast 08/01/2012   Memory loss 01/22/2012   Myalgia and myositis, unspecified 05/17/2011   Myopathy, unspecified    Osteoarthrosis, unspecified whether generalized or localized, lower leg    Other abnormal glucose 03/23/2010   Other chest pain 08/28/2012   Other disorders of bone and cartilage(733.99) 09/19/2012   Overactive bladder    Rash    inner legs- ? med allergy- to see PCP if doesnt improve by end of week   Rash and nonspecific skin eruption 10/28/2014   Right bundle branch block    Seborrheic keratoses 05/17/2011   Spasm of muscle 01/11/2011   Spinal stenosis, lumbar region, without neurogenic claudication    Stress incontinence 07/08/2014   Unspecified constipation 12/18/2011   Unspecified tinnitus    Unspecified urinary incontinence    Urgency of urination    Urinary frequency 09/18/2012   Past Surgical History:  Procedure Laterality Date   ABDOMINAL HYSTERECTOMY  APPENDECTOMY  1956   BACK SURGERY     lumbar-- ? type  Dr Shelle Iron   BREAST LUMPECTOMY  1993   breast cyst   BREAST SURGERY     right lumpectomy   CLEFT LIP REPAIR     with repair palate   ESOPHAGOGASTRODUODENOSCOPY Left 03/10/2013   Procedure: ESOPHAGOGASTRODUODENOSCOPY (EGD);  Surgeon: Theda Belfast, MD;  Location: [facility];  Service: Endoscopy;  Laterality: Left;   EYE SURGERY     bilateral cataract extraction with IOL   TOTAL KNEE ARTHROPLASTY  12/13/2011   Procedure: TOTAL KNEE ARTHROPLASTY;  Surgeon: Javier Docker, MD;  Location: [facility];  Service: Orthopedics;  Laterality: Left;    No Known Allergies  Allergies as of 05/27/2023   No Known Allergies      Medication List        Accurate as of May 27, 2023  5:15 PM. If you have any questions, ask your nurse or doctor.           acetaminophen 500 MG tablet Commonly known as: TYLENOL Take 500 mg by mouth 2 (two) times daily.   chlorhexidine 0.12 % solution Commonly known as: PERIDEX Use as directed in the mouth or throat 2 (two) times daily.  Give 1 application by mouth two times a day related to OTHER LESIONS OF ORAL MUCOSA (K13.79)   omeprazole 20 MG capsule Commonly known as: PRILOSEC Take 20 mg by mouth daily.   polyethylene glycol 17 g packet Commonly known as: MIRALAX / GLYCOLAX Take 17 g by mouth daily.   sodium fluoride 1.1 % Gel dental gel Commonly known as: FLUORISHIELD Place 1 Application onto teeth at bedtime.        Review of Systems  Unable to perform ROS: Dementia    Immunization History  Administered Date(s) Administered   Influenza Whole 05/27/2012, 06/10/2013, 06/06/2018   Influenza, High Dose Seasonal PF 05/30/2019, 06/25/2022   Influenza-Unspecified 06/14/2014, 05/26/2015, 06/13/2016, 06/17/2017, 06/08/2020, 06/14/2021   Moderna SARS-COV2 Booster Vaccination 01/12/2022, 06/28/2022   Moderna Sars-Covid-2 Vaccination 08/29/2019, 09/26/2019, 07/05/2020, 01/24/2021   Pfizer Covid-19 Vaccine Bivalent Booster 81yrs & up 05/16/2021   Pneumococcal Conjugate-13 06/24/2017   Pneumococcal Polysaccharide-23 10/03/2011   Td 10/03/2011   Tdap 10/31/2021   Zoster Recombinant(Shingrix) 10/31/2021, 01/31/2022   Zoster, Live 08/28/2007   Pertinent  Health Maintenance Due  Topic Date Due   INFLUENZA VACCINE  03/28/2023   DEXA SCAN  Completed      09/15/2015    3:29 PM 05/17/2017    9:30 AM 03/27/2019    5:06 PM 05/29/2022    3:06 PM 02/25/2023   10:32 AM  Fall Risk  Falls in the past year? No No -- 0 0  Number of falls in past year - Comments   Emmi Telephone Survey Actual Response =     Was there an injury with Fall?    0 0  Fall Risk Category Calculator    0 0  Fall Risk Category (Retired)    Low   (RETIRED) Patient Fall Risk Level    Low fall risk   Patient at Risk for  Falls Due to    No Fall Risks No Fall Risks  Fall risk Follow up    Falls evaluation completed Falls evaluation completed   Functional Status Survey:    Vitals:   05/27/23 1607  BP: (!) 143/72  Pulse: 99  Resp: 18  Temp: (!) 97.2 F (36.2 C)  SpO2: 98%  Weight: 137 lb  4.8 oz (62.3 kg)   Body mass index is 22.16 kg/m. Physical Exam Vitals and nursing note reviewed.  Constitutional:      Appearance: Normal appearance.     Comments: Examined in w/c, mumbles with eyes closed.   HENT:     Head: Normocephalic and atraumatic.     Mouth/Throat:     Mouth: Mucous membranes are moist.  Eyes:     Extraocular Movements: Extraocular movements intact.     Conjunctiva/sclera:     Right eye: Right conjunctiva is not injected.     Left eye: Left conjunctiva is not injected.     Pupils: Pupils are equal, round, and reactive to light.  Cardiovascular:     Rate and Rhythm: Normal rate and regular rhythm.     Pulses: Normal pulses.     Heart sounds: Normal heart sounds. No murmur heard. Pulmonary:     Effort: Pulmonary effort is normal.     Breath sounds: Normal breath sounds. No rales.  Abdominal:     General: Bowel sounds are normal. There is no distension.     Palpations: Abdomen is soft.     Tenderness: There is no abdominal tenderness. There is no guarding.  Musculoskeletal:     Cervical back: Normal range of motion and neck supple.     Right lower leg: No edema.     Left lower leg: No edema.     Comments: Chronic left knee pain, w/c for mobility.   Skin:    General: Skin is warm and dry.  Neurological:     General: No focal deficit present.     Mental Status: She is alert. Mental status is at baseline.     Gait: Gait abnormal.     Comments: Oriented to self, her room.   Psychiatric:        Mood and Affect: Mood normal.        Behavior: Behavior normal.     Labs reviewed: Recent Labs    09/04/22 0000  NA 141  K 4.2  CL 104  CO2 30*  BUN 14  CREATININE 0.8   CALCIUM 9.7   Recent Labs    09/04/22 0000  AST 12*  ALT 9  ALKPHOS 114  ALBUMIN 3.8   Recent Labs    09/04/22 0000  WBC 6.5  NEUTROABS 4,258.00  HGB 15.0  HCT 45  PLT 300   Lab Results  Component Value Date   TSH 1.28 09/04/2022   Lab Results  Component Value Date   HGBA1C 5.6 04/24/2016   Lab Results  Component Value Date   CHOL 189 09/06/2015   HDL 65 09/06/2015   LDLCALC 99 09/06/2015   TRIG 126 09/06/2015    Significant Diagnostic Results in last 30 days:  No results found.  Assessment/Plan  Adult failure to thrive  under hospice service for comfort measures.   Dementia without behavioral disturbance (HCC)  under hospice service for comfort measures.   Osteoarthritis, multiple sites  s/p L TKR 2013,  on Tylenol, chronic left knee pain is less since no longer ambulatory.   Duodenal ulcer without hemorrhage or perforation GERD/PUD/gastric erosions, stable, on Omeprazole,  Hgb 15 09/04/22  Gait abnormality W/c for mobility.    Family/ staff Communication: plan of care reviewed with the patient and charge nurse.   Labs/tests ordered:  none  Time spend 25 minutes.

## 2023-05-28 DIAGNOSIS — M159 Polyosteoarthritis, unspecified: Secondary | ICD-10-CM | POA: Diagnosis not present

## 2023-05-28 DIAGNOSIS — K269 Duodenal ulcer, unspecified as acute or chronic, without hemorrhage or perforation: Secondary | ICD-10-CM | POA: Diagnosis not present

## 2023-05-28 DIAGNOSIS — F0150 Vascular dementia without behavioral disturbance: Secondary | ICD-10-CM | POA: Diagnosis not present

## 2023-05-28 DIAGNOSIS — K219 Gastro-esophageal reflux disease without esophagitis: Secondary | ICD-10-CM | POA: Diagnosis not present

## 2023-05-28 DIAGNOSIS — R1312 Dysphagia, oropharyngeal phase: Secondary | ICD-10-CM | POA: Diagnosis not present

## 2023-05-28 DIAGNOSIS — I679 Cerebrovascular disease, unspecified: Secondary | ICD-10-CM | POA: Diagnosis not present

## 2023-05-28 DIAGNOSIS — K5900 Constipation, unspecified: Secondary | ICD-10-CM | POA: Diagnosis not present

## 2023-05-28 DIAGNOSIS — N3281 Overactive bladder: Secondary | ICD-10-CM | POA: Diagnosis not present

## 2023-05-28 DIAGNOSIS — F0390 Unspecified dementia without behavioral disturbance: Secondary | ICD-10-CM | POA: Diagnosis not present

## 2023-05-28 DIAGNOSIS — R634 Abnormal weight loss: Secondary | ICD-10-CM | POA: Diagnosis not present

## 2023-05-28 DIAGNOSIS — R5383 Other fatigue: Secondary | ICD-10-CM | POA: Diagnosis not present

## 2023-05-29 DIAGNOSIS — K269 Duodenal ulcer, unspecified as acute or chronic, without hemorrhage or perforation: Secondary | ICD-10-CM | POA: Diagnosis not present

## 2023-05-29 DIAGNOSIS — R634 Abnormal weight loss: Secondary | ICD-10-CM | POA: Diagnosis not present

## 2023-05-29 DIAGNOSIS — F0150 Vascular dementia without behavioral disturbance: Secondary | ICD-10-CM | POA: Diagnosis not present

## 2023-05-29 DIAGNOSIS — R1312 Dysphagia, oropharyngeal phase: Secondary | ICD-10-CM | POA: Diagnosis not present

## 2023-05-29 DIAGNOSIS — F0390 Unspecified dementia without behavioral disturbance: Secondary | ICD-10-CM | POA: Diagnosis not present

## 2023-05-29 DIAGNOSIS — K5900 Constipation, unspecified: Secondary | ICD-10-CM | POA: Diagnosis not present

## 2023-05-29 DIAGNOSIS — I679 Cerebrovascular disease, unspecified: Secondary | ICD-10-CM | POA: Diagnosis not present

## 2023-05-30 DIAGNOSIS — K269 Duodenal ulcer, unspecified as acute or chronic, without hemorrhage or perforation: Secondary | ICD-10-CM | POA: Diagnosis not present

## 2023-05-30 DIAGNOSIS — R634 Abnormal weight loss: Secondary | ICD-10-CM | POA: Diagnosis not present

## 2023-05-30 DIAGNOSIS — K5900 Constipation, unspecified: Secondary | ICD-10-CM | POA: Diagnosis not present

## 2023-05-30 DIAGNOSIS — F0150 Vascular dementia without behavioral disturbance: Secondary | ICD-10-CM | POA: Diagnosis not present

## 2023-05-30 DIAGNOSIS — I679 Cerebrovascular disease, unspecified: Secondary | ICD-10-CM | POA: Diagnosis not present

## 2023-05-30 DIAGNOSIS — R1312 Dysphagia, oropharyngeal phase: Secondary | ICD-10-CM | POA: Diagnosis not present

## 2023-05-31 DIAGNOSIS — R634 Abnormal weight loss: Secondary | ICD-10-CM | POA: Diagnosis not present

## 2023-05-31 DIAGNOSIS — F0390 Unspecified dementia without behavioral disturbance: Secondary | ICD-10-CM | POA: Diagnosis not present

## 2023-05-31 DIAGNOSIS — K5900 Constipation, unspecified: Secondary | ICD-10-CM | POA: Diagnosis not present

## 2023-05-31 DIAGNOSIS — R1312 Dysphagia, oropharyngeal phase: Secondary | ICD-10-CM | POA: Diagnosis not present

## 2023-05-31 DIAGNOSIS — K269 Duodenal ulcer, unspecified as acute or chronic, without hemorrhage or perforation: Secondary | ICD-10-CM | POA: Diagnosis not present

## 2023-05-31 DIAGNOSIS — I679 Cerebrovascular disease, unspecified: Secondary | ICD-10-CM | POA: Diagnosis not present

## 2023-05-31 DIAGNOSIS — F0150 Vascular dementia without behavioral disturbance: Secondary | ICD-10-CM | POA: Diagnosis not present

## 2023-06-01 DIAGNOSIS — I679 Cerebrovascular disease, unspecified: Secondary | ICD-10-CM | POA: Diagnosis not present

## 2023-06-01 DIAGNOSIS — F0150 Vascular dementia without behavioral disturbance: Secondary | ICD-10-CM | POA: Diagnosis not present

## 2023-06-01 DIAGNOSIS — R634 Abnormal weight loss: Secondary | ICD-10-CM | POA: Diagnosis not present

## 2023-06-01 DIAGNOSIS — K269 Duodenal ulcer, unspecified as acute or chronic, without hemorrhage or perforation: Secondary | ICD-10-CM | POA: Diagnosis not present

## 2023-06-01 DIAGNOSIS — R1312 Dysphagia, oropharyngeal phase: Secondary | ICD-10-CM | POA: Diagnosis not present

## 2023-06-01 DIAGNOSIS — K5900 Constipation, unspecified: Secondary | ICD-10-CM | POA: Diagnosis not present

## 2023-06-02 DIAGNOSIS — R634 Abnormal weight loss: Secondary | ICD-10-CM | POA: Diagnosis not present

## 2023-06-02 DIAGNOSIS — K269 Duodenal ulcer, unspecified as acute or chronic, without hemorrhage or perforation: Secondary | ICD-10-CM | POA: Diagnosis not present

## 2023-06-02 DIAGNOSIS — F0150 Vascular dementia without behavioral disturbance: Secondary | ICD-10-CM | POA: Diagnosis not present

## 2023-06-02 DIAGNOSIS — R1312 Dysphagia, oropharyngeal phase: Secondary | ICD-10-CM | POA: Diagnosis not present

## 2023-06-02 DIAGNOSIS — I679 Cerebrovascular disease, unspecified: Secondary | ICD-10-CM | POA: Diagnosis not present

## 2023-06-02 DIAGNOSIS — K5900 Constipation, unspecified: Secondary | ICD-10-CM | POA: Diagnosis not present

## 2023-06-03 DIAGNOSIS — K5900 Constipation, unspecified: Secondary | ICD-10-CM | POA: Diagnosis not present

## 2023-06-03 DIAGNOSIS — F0390 Unspecified dementia without behavioral disturbance: Secondary | ICD-10-CM | POA: Diagnosis not present

## 2023-06-03 DIAGNOSIS — R1312 Dysphagia, oropharyngeal phase: Secondary | ICD-10-CM | POA: Diagnosis not present

## 2023-06-03 DIAGNOSIS — I679 Cerebrovascular disease, unspecified: Secondary | ICD-10-CM | POA: Diagnosis not present

## 2023-06-03 DIAGNOSIS — F0150 Vascular dementia without behavioral disturbance: Secondary | ICD-10-CM | POA: Diagnosis not present

## 2023-06-03 DIAGNOSIS — R634 Abnormal weight loss: Secondary | ICD-10-CM | POA: Diagnosis not present

## 2023-06-03 DIAGNOSIS — K269 Duodenal ulcer, unspecified as acute or chronic, without hemorrhage or perforation: Secondary | ICD-10-CM | POA: Diagnosis not present

## 2023-06-04 DIAGNOSIS — K269 Duodenal ulcer, unspecified as acute or chronic, without hemorrhage or perforation: Secondary | ICD-10-CM | POA: Diagnosis not present

## 2023-06-04 DIAGNOSIS — F0150 Vascular dementia without behavioral disturbance: Secondary | ICD-10-CM | POA: Diagnosis not present

## 2023-06-04 DIAGNOSIS — I679 Cerebrovascular disease, unspecified: Secondary | ICD-10-CM | POA: Diagnosis not present

## 2023-06-04 DIAGNOSIS — R1312 Dysphagia, oropharyngeal phase: Secondary | ICD-10-CM | POA: Diagnosis not present

## 2023-06-04 DIAGNOSIS — R634 Abnormal weight loss: Secondary | ICD-10-CM | POA: Diagnosis not present

## 2023-06-04 DIAGNOSIS — K5900 Constipation, unspecified: Secondary | ICD-10-CM | POA: Diagnosis not present

## 2023-06-05 DIAGNOSIS — R634 Abnormal weight loss: Secondary | ICD-10-CM | POA: Diagnosis not present

## 2023-06-05 DIAGNOSIS — R1312 Dysphagia, oropharyngeal phase: Secondary | ICD-10-CM | POA: Diagnosis not present

## 2023-06-05 DIAGNOSIS — F0150 Vascular dementia without behavioral disturbance: Secondary | ICD-10-CM | POA: Diagnosis not present

## 2023-06-05 DIAGNOSIS — I679 Cerebrovascular disease, unspecified: Secondary | ICD-10-CM | POA: Diagnosis not present

## 2023-06-05 DIAGNOSIS — K269 Duodenal ulcer, unspecified as acute or chronic, without hemorrhage or perforation: Secondary | ICD-10-CM | POA: Diagnosis not present

## 2023-06-05 DIAGNOSIS — K5900 Constipation, unspecified: Secondary | ICD-10-CM | POA: Diagnosis not present

## 2023-06-06 DIAGNOSIS — I679 Cerebrovascular disease, unspecified: Secondary | ICD-10-CM | POA: Diagnosis not present

## 2023-06-06 DIAGNOSIS — F0390 Unspecified dementia without behavioral disturbance: Secondary | ICD-10-CM | POA: Diagnosis not present

## 2023-06-06 DIAGNOSIS — R1312 Dysphagia, oropharyngeal phase: Secondary | ICD-10-CM | POA: Diagnosis not present

## 2023-06-06 DIAGNOSIS — F0150 Vascular dementia without behavioral disturbance: Secondary | ICD-10-CM | POA: Diagnosis not present

## 2023-06-06 DIAGNOSIS — K5900 Constipation, unspecified: Secondary | ICD-10-CM | POA: Diagnosis not present

## 2023-06-06 DIAGNOSIS — K269 Duodenal ulcer, unspecified as acute or chronic, without hemorrhage or perforation: Secondary | ICD-10-CM | POA: Diagnosis not present

## 2023-06-06 DIAGNOSIS — R634 Abnormal weight loss: Secondary | ICD-10-CM | POA: Diagnosis not present

## 2023-06-07 DIAGNOSIS — F0390 Unspecified dementia without behavioral disturbance: Secondary | ICD-10-CM | POA: Diagnosis not present

## 2023-06-07 DIAGNOSIS — R634 Abnormal weight loss: Secondary | ICD-10-CM | POA: Diagnosis not present

## 2023-06-07 DIAGNOSIS — F0150 Vascular dementia without behavioral disturbance: Secondary | ICD-10-CM | POA: Diagnosis not present

## 2023-06-07 DIAGNOSIS — I679 Cerebrovascular disease, unspecified: Secondary | ICD-10-CM | POA: Diagnosis not present

## 2023-06-07 DIAGNOSIS — K5900 Constipation, unspecified: Secondary | ICD-10-CM | POA: Diagnosis not present

## 2023-06-07 DIAGNOSIS — K269 Duodenal ulcer, unspecified as acute or chronic, without hemorrhage or perforation: Secondary | ICD-10-CM | POA: Diagnosis not present

## 2023-06-07 DIAGNOSIS — R1312 Dysphagia, oropharyngeal phase: Secondary | ICD-10-CM | POA: Diagnosis not present

## 2023-06-08 DIAGNOSIS — F0150 Vascular dementia without behavioral disturbance: Secondary | ICD-10-CM | POA: Diagnosis not present

## 2023-06-08 DIAGNOSIS — R634 Abnormal weight loss: Secondary | ICD-10-CM | POA: Diagnosis not present

## 2023-06-08 DIAGNOSIS — I679 Cerebrovascular disease, unspecified: Secondary | ICD-10-CM | POA: Diagnosis not present

## 2023-06-08 DIAGNOSIS — K5900 Constipation, unspecified: Secondary | ICD-10-CM | POA: Diagnosis not present

## 2023-06-08 DIAGNOSIS — K269 Duodenal ulcer, unspecified as acute or chronic, without hemorrhage or perforation: Secondary | ICD-10-CM | POA: Diagnosis not present

## 2023-06-08 DIAGNOSIS — R1312 Dysphagia, oropharyngeal phase: Secondary | ICD-10-CM | POA: Diagnosis not present

## 2023-06-09 DIAGNOSIS — R634 Abnormal weight loss: Secondary | ICD-10-CM | POA: Diagnosis not present

## 2023-06-09 DIAGNOSIS — R1312 Dysphagia, oropharyngeal phase: Secondary | ICD-10-CM | POA: Diagnosis not present

## 2023-06-09 DIAGNOSIS — K269 Duodenal ulcer, unspecified as acute or chronic, without hemorrhage or perforation: Secondary | ICD-10-CM | POA: Diagnosis not present

## 2023-06-09 DIAGNOSIS — F0150 Vascular dementia without behavioral disturbance: Secondary | ICD-10-CM | POA: Diagnosis not present

## 2023-06-09 DIAGNOSIS — I679 Cerebrovascular disease, unspecified: Secondary | ICD-10-CM | POA: Diagnosis not present

## 2023-06-09 DIAGNOSIS — K5900 Constipation, unspecified: Secondary | ICD-10-CM | POA: Diagnosis not present

## 2023-06-10 DIAGNOSIS — K269 Duodenal ulcer, unspecified as acute or chronic, without hemorrhage or perforation: Secondary | ICD-10-CM | POA: Diagnosis not present

## 2023-06-10 DIAGNOSIS — R634 Abnormal weight loss: Secondary | ICD-10-CM | POA: Diagnosis not present

## 2023-06-10 DIAGNOSIS — I679 Cerebrovascular disease, unspecified: Secondary | ICD-10-CM | POA: Diagnosis not present

## 2023-06-10 DIAGNOSIS — K5900 Constipation, unspecified: Secondary | ICD-10-CM | POA: Diagnosis not present

## 2023-06-10 DIAGNOSIS — R1312 Dysphagia, oropharyngeal phase: Secondary | ICD-10-CM | POA: Diagnosis not present

## 2023-06-10 DIAGNOSIS — F0150 Vascular dementia without behavioral disturbance: Secondary | ICD-10-CM | POA: Diagnosis not present

## 2023-06-11 DIAGNOSIS — R634 Abnormal weight loss: Secondary | ICD-10-CM | POA: Diagnosis not present

## 2023-06-11 DIAGNOSIS — K5900 Constipation, unspecified: Secondary | ICD-10-CM | POA: Diagnosis not present

## 2023-06-11 DIAGNOSIS — R1312 Dysphagia, oropharyngeal phase: Secondary | ICD-10-CM | POA: Diagnosis not present

## 2023-06-11 DIAGNOSIS — K269 Duodenal ulcer, unspecified as acute or chronic, without hemorrhage or perforation: Secondary | ICD-10-CM | POA: Diagnosis not present

## 2023-06-11 DIAGNOSIS — F0150 Vascular dementia without behavioral disturbance: Secondary | ICD-10-CM | POA: Diagnosis not present

## 2023-06-11 DIAGNOSIS — I679 Cerebrovascular disease, unspecified: Secondary | ICD-10-CM | POA: Diagnosis not present

## 2023-06-12 DIAGNOSIS — K269 Duodenal ulcer, unspecified as acute or chronic, without hemorrhage or perforation: Secondary | ICD-10-CM | POA: Diagnosis not present

## 2023-06-12 DIAGNOSIS — F0390 Unspecified dementia without behavioral disturbance: Secondary | ICD-10-CM | POA: Diagnosis not present

## 2023-06-12 DIAGNOSIS — F0150 Vascular dementia without behavioral disturbance: Secondary | ICD-10-CM | POA: Diagnosis not present

## 2023-06-12 DIAGNOSIS — K5900 Constipation, unspecified: Secondary | ICD-10-CM | POA: Diagnosis not present

## 2023-06-12 DIAGNOSIS — I679 Cerebrovascular disease, unspecified: Secondary | ICD-10-CM | POA: Diagnosis not present

## 2023-06-12 DIAGNOSIS — R1312 Dysphagia, oropharyngeal phase: Secondary | ICD-10-CM | POA: Diagnosis not present

## 2023-06-12 DIAGNOSIS — R634 Abnormal weight loss: Secondary | ICD-10-CM | POA: Diagnosis not present

## 2023-06-13 DIAGNOSIS — R1312 Dysphagia, oropharyngeal phase: Secondary | ICD-10-CM | POA: Diagnosis not present

## 2023-06-13 DIAGNOSIS — K269 Duodenal ulcer, unspecified as acute or chronic, without hemorrhage or perforation: Secondary | ICD-10-CM | POA: Diagnosis not present

## 2023-06-13 DIAGNOSIS — K5900 Constipation, unspecified: Secondary | ICD-10-CM | POA: Diagnosis not present

## 2023-06-13 DIAGNOSIS — F0150 Vascular dementia without behavioral disturbance: Secondary | ICD-10-CM | POA: Diagnosis not present

## 2023-06-13 DIAGNOSIS — R634 Abnormal weight loss: Secondary | ICD-10-CM | POA: Diagnosis not present

## 2023-06-13 DIAGNOSIS — I679 Cerebrovascular disease, unspecified: Secondary | ICD-10-CM | POA: Diagnosis not present

## 2023-06-14 DIAGNOSIS — F0150 Vascular dementia without behavioral disturbance: Secondary | ICD-10-CM | POA: Diagnosis not present

## 2023-06-14 DIAGNOSIS — I679 Cerebrovascular disease, unspecified: Secondary | ICD-10-CM | POA: Diagnosis not present

## 2023-06-14 DIAGNOSIS — R634 Abnormal weight loss: Secondary | ICD-10-CM | POA: Diagnosis not present

## 2023-06-14 DIAGNOSIS — R1312 Dysphagia, oropharyngeal phase: Secondary | ICD-10-CM | POA: Diagnosis not present

## 2023-06-14 DIAGNOSIS — F0390 Unspecified dementia without behavioral disturbance: Secondary | ICD-10-CM | POA: Diagnosis not present

## 2023-06-14 DIAGNOSIS — K269 Duodenal ulcer, unspecified as acute or chronic, without hemorrhage or perforation: Secondary | ICD-10-CM | POA: Diagnosis not present

## 2023-06-14 DIAGNOSIS — K5900 Constipation, unspecified: Secondary | ICD-10-CM | POA: Diagnosis not present

## 2023-06-15 DIAGNOSIS — F0150 Vascular dementia without behavioral disturbance: Secondary | ICD-10-CM | POA: Diagnosis not present

## 2023-06-15 DIAGNOSIS — I679 Cerebrovascular disease, unspecified: Secondary | ICD-10-CM | POA: Diagnosis not present

## 2023-06-15 DIAGNOSIS — R1312 Dysphagia, oropharyngeal phase: Secondary | ICD-10-CM | POA: Diagnosis not present

## 2023-06-15 DIAGNOSIS — K269 Duodenal ulcer, unspecified as acute or chronic, without hemorrhage or perforation: Secondary | ICD-10-CM | POA: Diagnosis not present

## 2023-06-15 DIAGNOSIS — F0390 Unspecified dementia without behavioral disturbance: Secondary | ICD-10-CM | POA: Diagnosis not present

## 2023-06-15 DIAGNOSIS — K5900 Constipation, unspecified: Secondary | ICD-10-CM | POA: Diagnosis not present

## 2023-06-15 DIAGNOSIS — R634 Abnormal weight loss: Secondary | ICD-10-CM | POA: Diagnosis not present

## 2023-06-16 DIAGNOSIS — R634 Abnormal weight loss: Secondary | ICD-10-CM | POA: Diagnosis not present

## 2023-06-16 DIAGNOSIS — I679 Cerebrovascular disease, unspecified: Secondary | ICD-10-CM | POA: Diagnosis not present

## 2023-06-16 DIAGNOSIS — K269 Duodenal ulcer, unspecified as acute or chronic, without hemorrhage or perforation: Secondary | ICD-10-CM | POA: Diagnosis not present

## 2023-06-16 DIAGNOSIS — R1312 Dysphagia, oropharyngeal phase: Secondary | ICD-10-CM | POA: Diagnosis not present

## 2023-06-16 DIAGNOSIS — K5900 Constipation, unspecified: Secondary | ICD-10-CM | POA: Diagnosis not present

## 2023-06-16 DIAGNOSIS — F0150 Vascular dementia without behavioral disturbance: Secondary | ICD-10-CM | POA: Diagnosis not present

## 2023-06-17 DIAGNOSIS — R1312 Dysphagia, oropharyngeal phase: Secondary | ICD-10-CM | POA: Diagnosis not present

## 2023-06-17 DIAGNOSIS — I679 Cerebrovascular disease, unspecified: Secondary | ICD-10-CM | POA: Diagnosis not present

## 2023-06-17 DIAGNOSIS — F0390 Unspecified dementia without behavioral disturbance: Secondary | ICD-10-CM | POA: Diagnosis not present

## 2023-06-17 DIAGNOSIS — K5900 Constipation, unspecified: Secondary | ICD-10-CM | POA: Diagnosis not present

## 2023-06-17 DIAGNOSIS — F0150 Vascular dementia without behavioral disturbance: Secondary | ICD-10-CM | POA: Diagnosis not present

## 2023-06-17 DIAGNOSIS — K269 Duodenal ulcer, unspecified as acute or chronic, without hemorrhage or perforation: Secondary | ICD-10-CM | POA: Diagnosis not present

## 2023-06-17 DIAGNOSIS — R634 Abnormal weight loss: Secondary | ICD-10-CM | POA: Diagnosis not present

## 2023-06-18 DIAGNOSIS — F0150 Vascular dementia without behavioral disturbance: Secondary | ICD-10-CM | POA: Diagnosis not present

## 2023-06-18 DIAGNOSIS — K5900 Constipation, unspecified: Secondary | ICD-10-CM | POA: Diagnosis not present

## 2023-06-18 DIAGNOSIS — I679 Cerebrovascular disease, unspecified: Secondary | ICD-10-CM | POA: Diagnosis not present

## 2023-06-18 DIAGNOSIS — R634 Abnormal weight loss: Secondary | ICD-10-CM | POA: Diagnosis not present

## 2023-06-18 DIAGNOSIS — R1312 Dysphagia, oropharyngeal phase: Secondary | ICD-10-CM | POA: Diagnosis not present

## 2023-06-18 DIAGNOSIS — K269 Duodenal ulcer, unspecified as acute or chronic, without hemorrhage or perforation: Secondary | ICD-10-CM | POA: Diagnosis not present

## 2023-06-19 DIAGNOSIS — K5900 Constipation, unspecified: Secondary | ICD-10-CM | POA: Diagnosis not present

## 2023-06-19 DIAGNOSIS — R1312 Dysphagia, oropharyngeal phase: Secondary | ICD-10-CM | POA: Diagnosis not present

## 2023-06-19 DIAGNOSIS — R634 Abnormal weight loss: Secondary | ICD-10-CM | POA: Diagnosis not present

## 2023-06-19 DIAGNOSIS — K269 Duodenal ulcer, unspecified as acute or chronic, without hemorrhage or perforation: Secondary | ICD-10-CM | POA: Diagnosis not present

## 2023-06-19 DIAGNOSIS — F0150 Vascular dementia without behavioral disturbance: Secondary | ICD-10-CM | POA: Diagnosis not present

## 2023-06-19 DIAGNOSIS — I679 Cerebrovascular disease, unspecified: Secondary | ICD-10-CM | POA: Diagnosis not present

## 2023-06-20 DIAGNOSIS — K269 Duodenal ulcer, unspecified as acute or chronic, without hemorrhage or perforation: Secondary | ICD-10-CM | POA: Diagnosis not present

## 2023-06-20 DIAGNOSIS — R1312 Dysphagia, oropharyngeal phase: Secondary | ICD-10-CM | POA: Diagnosis not present

## 2023-06-20 DIAGNOSIS — F0390 Unspecified dementia without behavioral disturbance: Secondary | ICD-10-CM | POA: Diagnosis not present

## 2023-06-20 DIAGNOSIS — K5900 Constipation, unspecified: Secondary | ICD-10-CM | POA: Diagnosis not present

## 2023-06-20 DIAGNOSIS — R634 Abnormal weight loss: Secondary | ICD-10-CM | POA: Diagnosis not present

## 2023-06-20 DIAGNOSIS — F0150 Vascular dementia without behavioral disturbance: Secondary | ICD-10-CM | POA: Diagnosis not present

## 2023-06-20 DIAGNOSIS — I679 Cerebrovascular disease, unspecified: Secondary | ICD-10-CM | POA: Diagnosis not present

## 2023-06-21 DIAGNOSIS — R1312 Dysphagia, oropharyngeal phase: Secondary | ICD-10-CM | POA: Diagnosis not present

## 2023-06-21 DIAGNOSIS — F0390 Unspecified dementia without behavioral disturbance: Secondary | ICD-10-CM | POA: Diagnosis not present

## 2023-06-21 DIAGNOSIS — K269 Duodenal ulcer, unspecified as acute or chronic, without hemorrhage or perforation: Secondary | ICD-10-CM | POA: Diagnosis not present

## 2023-06-21 DIAGNOSIS — I679 Cerebrovascular disease, unspecified: Secondary | ICD-10-CM | POA: Diagnosis not present

## 2023-06-21 DIAGNOSIS — R634 Abnormal weight loss: Secondary | ICD-10-CM | POA: Diagnosis not present

## 2023-06-21 DIAGNOSIS — K5900 Constipation, unspecified: Secondary | ICD-10-CM | POA: Diagnosis not present

## 2023-06-21 DIAGNOSIS — F0150 Vascular dementia without behavioral disturbance: Secondary | ICD-10-CM | POA: Diagnosis not present

## 2023-06-22 DIAGNOSIS — R1312 Dysphagia, oropharyngeal phase: Secondary | ICD-10-CM | POA: Diagnosis not present

## 2023-06-22 DIAGNOSIS — F0150 Vascular dementia without behavioral disturbance: Secondary | ICD-10-CM | POA: Diagnosis not present

## 2023-06-22 DIAGNOSIS — K269 Duodenal ulcer, unspecified as acute or chronic, without hemorrhage or perforation: Secondary | ICD-10-CM | POA: Diagnosis not present

## 2023-06-22 DIAGNOSIS — R634 Abnormal weight loss: Secondary | ICD-10-CM | POA: Diagnosis not present

## 2023-06-22 DIAGNOSIS — K5900 Constipation, unspecified: Secondary | ICD-10-CM | POA: Diagnosis not present

## 2023-06-22 DIAGNOSIS — I679 Cerebrovascular disease, unspecified: Secondary | ICD-10-CM | POA: Diagnosis not present

## 2023-06-23 DIAGNOSIS — K5900 Constipation, unspecified: Secondary | ICD-10-CM | POA: Diagnosis not present

## 2023-06-23 DIAGNOSIS — K269 Duodenal ulcer, unspecified as acute or chronic, without hemorrhage or perforation: Secondary | ICD-10-CM | POA: Diagnosis not present

## 2023-06-23 DIAGNOSIS — R1312 Dysphagia, oropharyngeal phase: Secondary | ICD-10-CM | POA: Diagnosis not present

## 2023-06-23 DIAGNOSIS — I679 Cerebrovascular disease, unspecified: Secondary | ICD-10-CM | POA: Diagnosis not present

## 2023-06-23 DIAGNOSIS — R634 Abnormal weight loss: Secondary | ICD-10-CM | POA: Diagnosis not present

## 2023-06-23 DIAGNOSIS — F0150 Vascular dementia without behavioral disturbance: Secondary | ICD-10-CM | POA: Diagnosis not present

## 2023-06-24 DIAGNOSIS — R634 Abnormal weight loss: Secondary | ICD-10-CM | POA: Diagnosis not present

## 2023-06-24 DIAGNOSIS — F0150 Vascular dementia without behavioral disturbance: Secondary | ICD-10-CM | POA: Diagnosis not present

## 2023-06-24 DIAGNOSIS — K5900 Constipation, unspecified: Secondary | ICD-10-CM | POA: Diagnosis not present

## 2023-06-24 DIAGNOSIS — I679 Cerebrovascular disease, unspecified: Secondary | ICD-10-CM | POA: Diagnosis not present

## 2023-06-24 DIAGNOSIS — R1312 Dysphagia, oropharyngeal phase: Secondary | ICD-10-CM | POA: Diagnosis not present

## 2023-06-24 DIAGNOSIS — F0390 Unspecified dementia without behavioral disturbance: Secondary | ICD-10-CM | POA: Diagnosis not present

## 2023-06-24 DIAGNOSIS — K269 Duodenal ulcer, unspecified as acute or chronic, without hemorrhage or perforation: Secondary | ICD-10-CM | POA: Diagnosis not present

## 2023-06-25 DIAGNOSIS — R634 Abnormal weight loss: Secondary | ICD-10-CM | POA: Diagnosis not present

## 2023-06-25 DIAGNOSIS — F0390 Unspecified dementia without behavioral disturbance: Secondary | ICD-10-CM | POA: Diagnosis not present

## 2023-06-25 DIAGNOSIS — K269 Duodenal ulcer, unspecified as acute or chronic, without hemorrhage or perforation: Secondary | ICD-10-CM | POA: Diagnosis not present

## 2023-06-25 DIAGNOSIS — I679 Cerebrovascular disease, unspecified: Secondary | ICD-10-CM | POA: Diagnosis not present

## 2023-06-25 DIAGNOSIS — R1312 Dysphagia, oropharyngeal phase: Secondary | ICD-10-CM | POA: Diagnosis not present

## 2023-06-25 DIAGNOSIS — F0150 Vascular dementia without behavioral disturbance: Secondary | ICD-10-CM | POA: Diagnosis not present

## 2023-06-25 DIAGNOSIS — K5900 Constipation, unspecified: Secondary | ICD-10-CM | POA: Diagnosis not present

## 2023-06-26 DIAGNOSIS — I679 Cerebrovascular disease, unspecified: Secondary | ICD-10-CM | POA: Diagnosis not present

## 2023-06-26 DIAGNOSIS — K5900 Constipation, unspecified: Secondary | ICD-10-CM | POA: Diagnosis not present

## 2023-06-26 DIAGNOSIS — F0150 Vascular dementia without behavioral disturbance: Secondary | ICD-10-CM | POA: Diagnosis not present

## 2023-06-26 DIAGNOSIS — R634 Abnormal weight loss: Secondary | ICD-10-CM | POA: Diagnosis not present

## 2023-06-26 DIAGNOSIS — R1312 Dysphagia, oropharyngeal phase: Secondary | ICD-10-CM | POA: Diagnosis not present

## 2023-06-26 DIAGNOSIS — K269 Duodenal ulcer, unspecified as acute or chronic, without hemorrhage or perforation: Secondary | ICD-10-CM | POA: Diagnosis not present

## 2023-06-27 DIAGNOSIS — K5900 Constipation, unspecified: Secondary | ICD-10-CM | POA: Diagnosis not present

## 2023-06-27 DIAGNOSIS — K269 Duodenal ulcer, unspecified as acute or chronic, without hemorrhage or perforation: Secondary | ICD-10-CM | POA: Diagnosis not present

## 2023-06-27 DIAGNOSIS — R634 Abnormal weight loss: Secondary | ICD-10-CM | POA: Diagnosis not present

## 2023-06-27 DIAGNOSIS — R1312 Dysphagia, oropharyngeal phase: Secondary | ICD-10-CM | POA: Diagnosis not present

## 2023-06-27 DIAGNOSIS — F0390 Unspecified dementia without behavioral disturbance: Secondary | ICD-10-CM | POA: Diagnosis not present

## 2023-06-27 DIAGNOSIS — F0150 Vascular dementia without behavioral disturbance: Secondary | ICD-10-CM | POA: Diagnosis not present

## 2023-06-27 DIAGNOSIS — I679 Cerebrovascular disease, unspecified: Secondary | ICD-10-CM | POA: Diagnosis not present

## 2023-06-28 DIAGNOSIS — M159 Polyosteoarthritis, unspecified: Secondary | ICD-10-CM | POA: Diagnosis not present

## 2023-06-28 DIAGNOSIS — R634 Abnormal weight loss: Secondary | ICD-10-CM | POA: Diagnosis not present

## 2023-06-28 DIAGNOSIS — R5383 Other fatigue: Secondary | ICD-10-CM | POA: Diagnosis not present

## 2023-06-28 DIAGNOSIS — N3281 Overactive bladder: Secondary | ICD-10-CM | POA: Diagnosis not present

## 2023-06-28 DIAGNOSIS — K5900 Constipation, unspecified: Secondary | ICD-10-CM | POA: Diagnosis not present

## 2023-06-28 DIAGNOSIS — R1312 Dysphagia, oropharyngeal phase: Secondary | ICD-10-CM | POA: Diagnosis not present

## 2023-06-28 DIAGNOSIS — K269 Duodenal ulcer, unspecified as acute or chronic, without hemorrhage or perforation: Secondary | ICD-10-CM | POA: Diagnosis not present

## 2023-06-28 DIAGNOSIS — I679 Cerebrovascular disease, unspecified: Secondary | ICD-10-CM | POA: Diagnosis not present

## 2023-06-28 DIAGNOSIS — K219 Gastro-esophageal reflux disease without esophagitis: Secondary | ICD-10-CM | POA: Diagnosis not present

## 2023-06-28 DIAGNOSIS — F0150 Vascular dementia without behavioral disturbance: Secondary | ICD-10-CM | POA: Diagnosis not present

## 2023-07-01 DIAGNOSIS — F0150 Vascular dementia without behavioral disturbance: Secondary | ICD-10-CM | POA: Diagnosis not present

## 2023-07-01 DIAGNOSIS — K269 Duodenal ulcer, unspecified as acute or chronic, without hemorrhage or perforation: Secondary | ICD-10-CM | POA: Diagnosis not present

## 2023-07-01 DIAGNOSIS — R1312 Dysphagia, oropharyngeal phase: Secondary | ICD-10-CM | POA: Diagnosis not present

## 2023-07-01 DIAGNOSIS — R634 Abnormal weight loss: Secondary | ICD-10-CM | POA: Diagnosis not present

## 2023-07-01 DIAGNOSIS — I679 Cerebrovascular disease, unspecified: Secondary | ICD-10-CM | POA: Diagnosis not present

## 2023-07-01 DIAGNOSIS — K5900 Constipation, unspecified: Secondary | ICD-10-CM | POA: Diagnosis not present

## 2023-07-01 DIAGNOSIS — F0390 Unspecified dementia without behavioral disturbance: Secondary | ICD-10-CM | POA: Diagnosis not present

## 2023-07-02 DIAGNOSIS — F0150 Vascular dementia without behavioral disturbance: Secondary | ICD-10-CM | POA: Diagnosis not present

## 2023-07-02 DIAGNOSIS — K269 Duodenal ulcer, unspecified as acute or chronic, without hemorrhage or perforation: Secondary | ICD-10-CM | POA: Diagnosis not present

## 2023-07-02 DIAGNOSIS — R634 Abnormal weight loss: Secondary | ICD-10-CM | POA: Diagnosis not present

## 2023-07-02 DIAGNOSIS — R1312 Dysphagia, oropharyngeal phase: Secondary | ICD-10-CM | POA: Diagnosis not present

## 2023-07-02 DIAGNOSIS — K5900 Constipation, unspecified: Secondary | ICD-10-CM | POA: Diagnosis not present

## 2023-07-02 DIAGNOSIS — I679 Cerebrovascular disease, unspecified: Secondary | ICD-10-CM | POA: Diagnosis not present

## 2023-07-03 DIAGNOSIS — K269 Duodenal ulcer, unspecified as acute or chronic, without hemorrhage or perforation: Secondary | ICD-10-CM | POA: Diagnosis not present

## 2023-07-03 DIAGNOSIS — I679 Cerebrovascular disease, unspecified: Secondary | ICD-10-CM | POA: Diagnosis not present

## 2023-07-03 DIAGNOSIS — F0390 Unspecified dementia without behavioral disturbance: Secondary | ICD-10-CM | POA: Diagnosis not present

## 2023-07-03 DIAGNOSIS — R634 Abnormal weight loss: Secondary | ICD-10-CM | POA: Diagnosis not present

## 2023-07-03 DIAGNOSIS — K5900 Constipation, unspecified: Secondary | ICD-10-CM | POA: Diagnosis not present

## 2023-07-03 DIAGNOSIS — R1312 Dysphagia, oropharyngeal phase: Secondary | ICD-10-CM | POA: Diagnosis not present

## 2023-07-03 DIAGNOSIS — F0150 Vascular dementia without behavioral disturbance: Secondary | ICD-10-CM | POA: Diagnosis not present

## 2023-07-05 DIAGNOSIS — K5900 Constipation, unspecified: Secondary | ICD-10-CM | POA: Diagnosis not present

## 2023-07-05 DIAGNOSIS — R1312 Dysphagia, oropharyngeal phase: Secondary | ICD-10-CM | POA: Diagnosis not present

## 2023-07-05 DIAGNOSIS — R634 Abnormal weight loss: Secondary | ICD-10-CM | POA: Diagnosis not present

## 2023-07-05 DIAGNOSIS — I679 Cerebrovascular disease, unspecified: Secondary | ICD-10-CM | POA: Diagnosis not present

## 2023-07-05 DIAGNOSIS — K269 Duodenal ulcer, unspecified as acute or chronic, without hemorrhage or perforation: Secondary | ICD-10-CM | POA: Diagnosis not present

## 2023-07-05 DIAGNOSIS — F0390 Unspecified dementia without behavioral disturbance: Secondary | ICD-10-CM | POA: Diagnosis not present

## 2023-07-05 DIAGNOSIS — F0150 Vascular dementia without behavioral disturbance: Secondary | ICD-10-CM | POA: Diagnosis not present

## 2023-07-07 DIAGNOSIS — F0390 Unspecified dementia without behavioral disturbance: Secondary | ICD-10-CM | POA: Diagnosis not present

## 2023-07-09 ENCOUNTER — Encounter: Payer: Self-pay | Admitting: Nurse Practitioner

## 2023-07-09 ENCOUNTER — Non-Acute Institutional Stay (SKILLED_NURSING_FACILITY): Payer: Medicare Other | Admitting: Nurse Practitioner

## 2023-07-09 DIAGNOSIS — I10 Essential (primary) hypertension: Secondary | ICD-10-CM | POA: Diagnosis not present

## 2023-07-09 DIAGNOSIS — R627 Adult failure to thrive: Secondary | ICD-10-CM

## 2023-07-09 DIAGNOSIS — Z66 Do not resuscitate: Secondary | ICD-10-CM

## 2023-07-09 DIAGNOSIS — R1312 Dysphagia, oropharyngeal phase: Secondary | ICD-10-CM | POA: Diagnosis not present

## 2023-07-09 DIAGNOSIS — K269 Duodenal ulcer, unspecified as acute or chronic, without hemorrhage or perforation: Secondary | ICD-10-CM | POA: Diagnosis not present

## 2023-07-09 DIAGNOSIS — F0390 Unspecified dementia without behavioral disturbance: Secondary | ICD-10-CM | POA: Diagnosis not present

## 2023-07-09 DIAGNOSIS — I679 Cerebrovascular disease, unspecified: Secondary | ICD-10-CM | POA: Diagnosis not present

## 2023-07-09 DIAGNOSIS — F0150 Vascular dementia without behavioral disturbance: Secondary | ICD-10-CM | POA: Diagnosis not present

## 2023-07-09 DIAGNOSIS — K257 Chronic gastric ulcer without hemorrhage or perforation: Secondary | ICD-10-CM | POA: Diagnosis not present

## 2023-07-09 DIAGNOSIS — K5900 Constipation, unspecified: Secondary | ICD-10-CM | POA: Diagnosis not present

## 2023-07-09 DIAGNOSIS — M150 Primary generalized (osteo)arthritis: Secondary | ICD-10-CM | POA: Diagnosis not present

## 2023-07-09 DIAGNOSIS — K5901 Slow transit constipation: Secondary | ICD-10-CM

## 2023-07-09 DIAGNOSIS — R634 Abnormal weight loss: Secondary | ICD-10-CM | POA: Diagnosis not present

## 2023-07-09 NOTE — Assessment & Plan Note (Signed)
off Memantine for memory. MMSE 20/30 09/10/19. TSH 1.28 09/04/22

## 2023-07-09 NOTE — Assessment & Plan Note (Signed)
stable, MiraLax daily.

## 2023-07-09 NOTE — Assessment & Plan Note (Signed)
stable, on Omeprazole,  Hgb 15 04/16/23

## 2023-07-09 NOTE — Assessment & Plan Note (Signed)
s/p L TKR 2013,  on Tylenol, w/c for mobility

## 2023-07-09 NOTE — Progress Notes (Unsigned)
Location:  [facility] Room Number: [id number] Place of Service:  SNF (31) Provider:  Laticia Vannostrand X,NP  Tandrea Kommer X, NP  Patient Care Team: Yarissa Reining X, NP as PCP - General (Internal Medicine) [name] Layanna Charo X, NP as Nurse Practitioner (Nurse Practitioner) [name], MD as Attending Physician (Urology) [name], MD as Consulting Physician (Gastroenterology)  Extended Emergency Contact Information Primary Emergency Contact: [place],[name] Address: [address]          [name] [address] [name] Home Phone: [phone] Relation: Son  Code Status:  DNR Goals of care: Advanced Directive information    07/09/2023   10:37 AM  Advanced Directives  Does Patient Have a Medical Advance Directive? Yes  Type of Estate agent of [place];Out of facility DNR (pink MOST or yellow form);Living will  Does patient want to make changes to medical advance directive? No - Patient declined  Copy of Healthcare Power of Attorney in Chart? Yes - validated most recent copy scanned in chart (See row information)  Pre-existing out of facility DNR order (yellow form or pink MOST form) Pink MOST/Yellow Form most recent copy in chart - Physician notified to receive inpatient order     Chief Complaint  Patient presents with  . Medical Management of Chronic Issues    Routine visit and discuss covid and flu vaccines.    HPI:  Pt is a 89 y.o. female seen today for medical management of chronic diseases.   Failure to thrive, under hospice service for comfort measures. Gradual weight loss.  Dementia, off Memantine for memory. MMSE 20/30 09/10/19. TSH 1.28 09/04/22             Hx of OA, s/p L TKR 2013,  on Tylenol, w/c for mobility              Gait abnormalit, w/c for mobility             GERD/PUD/gastric erosions, stable, on Omeprazole,  Hgb 15 04/16/23             Urinary frequency, not taking meds              Constipation, stable, MiraLax daily.              BPPV no recent occurrence.      Past Medical History:  Diagnosis Date  . Allergic rhinitis due to pollen 07/01/2009  . Anemia, unspecified 09/07/2010  . Arthritis   . Benign paroxysmal positional vertigo   . Candidiasis 01/01/2012  . Cramp of limb 01/11/2011  . Disorder of bone and cartilage, unspecified 11/10/2010  . Dizziness and giddiness 11/10/2010  . Insomnia, unspecified 09/07/2010  . Knee pain, left anterior   . Lumbago 07/01/2009  . Lump or mass in breast 08/01/2012  . Memory loss 01/22/2012  . Myalgia and myositis, unspecified 05/17/2011  . Myopathy, unspecified   . Osteoarthrosis, unspecified whether generalized or localized, lower leg   . Other abnormal glucose 03/23/2010  . Other chest pain 08/28/2012  . Other disorders of bone and cartilage(733.99) 09/19/2012  . Overactive bladder   . Rash    inner legs- ? med allergy- to see PCP if doesnt improve by end of week  . Rash and nonspecific skin eruption 10/28/2014  . Right bundle branch block   . Seborrheic keratoses 05/17/2011  . Spasm of muscle 01/11/2011  . Spinal stenosis, lumbar region, without neurogenic claudication   . Stress incontinence 07/08/2014  .  Unspecified constipation 12/18/2011  . Unspecified tinnitus   . Unspecified urinary incontinence   . Urgency of urination   . Urinary frequency 09/18/2012   Past Surgical History:  Procedure Laterality Date  . ABDOMINAL HYSTERECTOMY    . APPENDECTOMY  1956  . BACK SURGERY     lumbar-- ? type  Dr Shelle Iron  . BREAST LUMPECTOMY  1993   breast cyst  . BREAST SURGERY     right lumpectomy  . CLEFT LIP REPAIR     with repair palate  . ESOPHAGOGASTRODUODENOSCOPY Left 03/10/2013   Procedure: ESOPHAGOGASTRODUODENOSCOPY (EGD);  Surgeon: Theda Belfast, MD;  Location: [facility];  Service: Endoscopy;  Laterality: Left;  . EYE SURGERY     bilateral cataract extraction with IOL  . TOTAL KNEE ARTHROPLASTY  12/13/2011   Procedure:  TOTAL KNEE ARTHROPLASTY;  Surgeon: Javier Docker, MD;  Location: [facility];  Service: Orthopedics;  Laterality: Left;    No Known Allergies  Outpatient Encounter Medications as of 07/09/2023  Medication Sig  . acetaminophen (TYLENOL) 500 MG tablet Take 500 mg by mouth 2 (two) times daily.  . chlorhexidine (PERIDEX) 0.12 % solution Use as directed in the mouth or throat 2 (two) times daily.  Give 1 application by mouth two times a day related to OTHER LESIONS OF ORAL MUCOSA (K13.79)  . omeprazole (PRILOSEC) 20 MG capsule Take 20 mg by mouth daily.  . polyethylene glycol (MIRALAX / GLYCOLAX) 17 g packet Take 17 g by mouth daily.  . sodium fluoride (FLUORISHIELD) 1.1 % GEL dental gel Place 1 Application onto teeth at bedtime.   No facility-administered encounter medications on file as of 07/09/2023.    Review of Systems  Unable to perform ROS: Dementia    Immunization History  Administered Date(s) Administered  . Influenza Whole 05/27/2012, 06/10/2013, 06/06/2018  . Influenza, High Dose Seasonal PF 05/30/2019, 06/25/2022  . Influenza-Unspecified 06/14/2014, 05/26/2015, 06/13/2016, 06/17/2017, 06/08/2020, 06/14/2021  . Moderna SARS-COV2 Booster Vaccination 01/12/2022, 06/28/2022  . Moderna Sars-Covid-2 Vaccination 08/29/2019, 09/26/2019, 07/05/2020, 01/24/2021  . [employer] officer, [employer] 18yrs & up 05/16/2021  . Pneumococcal Conjugate-13 06/24/2017  . Pneumococcal Polysaccharide-23 10/03/2011  . Td 10/03/2011  . Tdap 10/31/2021  . Zoster Recombinant(Shingrix) 10/31/2021, 01/31/2022  . Zoster, Live 08/28/2007   Pertinent  Health Maintenance Due  Topic Date Due  . INFLUENZA VACCINE  03/28/2023  . DEXA SCAN  Completed      09/15/2015    3:29 PM 05/17/2017    9:30 AM 03/27/2019    5:06 PM 05/29/2022    3:06 PM 02/25/2023   10:32 AM  Fall Risk  Falls in the past year? No No -- 0 0  Number of falls in past year - Comments   Emmi Telephone Survey Actual Response =      Was there an injury with Fall?    0 0  Fall Risk Category Calculator    0 0  Fall Risk Category (Retired)    Low   (RETIRED) Patient Fall Risk Level    Low fall risk   Patient at Risk for Falls Due to    No Fall Risks No Fall Risks  Fall risk Follow up    Falls evaluation completed Falls evaluation completed   Functional Status Survey:    Vitals:   07/09/23 1034 07/10/23 1551  BP: (!) 96/59 (!) 96/59  Pulse: 86   Resp: 14   Temp: 98.1 F (36.7 C)   SpO2: 92%   Weight: 132  lb (59.9 kg)   Height: 5\' 6"  (1.676 m)    Body mass index is 21.31 kg/m. Physical Exam Vitals and nursing note reviewed.  Constitutional:      Appearance: Normal appearance.  HENT:     Head: Normocephalic and atraumatic.     Mouth/Throat:     Mouth: Mucous membranes are moist.  Eyes:     Extraocular Movements: Extraocular movements intact.     Conjunctiva/sclera:     Right eye: Right conjunctiva is not injected.     Left eye: Left conjunctiva is not injected.     Pupils: Pupils are equal, round, and reactive to light.  Cardiovascular:     Rate and Rhythm: Normal rate and regular rhythm.     Pulses: Normal pulses.     Heart sounds: Normal heart sounds. No murmur heard. Pulmonary:     Effort: Pulmonary effort is normal.     Breath sounds: Normal breath sounds. No rales.  Abdominal:     General: Bowel sounds are normal. There is no distension.     Palpations: Abdomen is soft.     Tenderness: There is no abdominal tenderness. There is no guarding.  Musculoskeletal:     Cervical back: Normal range of motion and neck supple.     Right lower leg: No edema.     Left lower leg: No edema.     Comments: Chronic left knee pain, w/c for mobility.   Skin:    General: Skin is warm and dry.  Neurological:     General: No focal deficit present.     Mental Status: She is alert. Mental status is at baseline.     Gait: Gait abnormal.     Comments: Oriented to self, her room.   Psychiatric:        Mood and  Affect: Mood normal.        Behavior: Behavior normal.    Labs reviewed: Recent Labs    09/04/22 0000 04/16/23 0000  NA 141 142  K 4.2 3.7  CL 104 105  CO2 30* 26*  BUN 14 20  CREATININE 0.8 0.8  CALCIUM 9.7 9.8   Recent Labs    09/04/22 0000 04/16/23 0000  AST 12* 16  ALT 9 16  ALKPHOS 114 154*  ALBUMIN 3.8 3.8   Recent Labs    09/04/22 0000 04/16/23 0000  WBC 6.5 7.9  NEUTROABS 4,258.00 6,462.00  HGB 15.0 15.0  HCT 45 45  PLT 300 303   Lab Results  Component Value Date   TSH 1.28 09/04/2022   Lab Results  Component Value Date   HGBA1C 5.6 04/24/2016   Lab Results  Component Value Date   CHOL 189 09/06/2015   HDL 65 09/06/2015   LDLCALC 99 09/06/2015   TRIG 126 09/06/2015    Significant Diagnostic Results in last 30 days:  No results found.  Assessment/Plan Slow transit constipation  stable, MiraLax daily.   Gastric erosions  stable, on Omeprazole,  Hgb 15 04/16/23  Osteoarthritis, multiple sites  s/p L TKR 2013,  on Tylenol, w/c for mobility   Dementia without behavioral disturbance (HCC) off Memantine for memory. MMSE 20/30 09/10/19. TSH 1.28 09/04/22  Adult failure to thrive under hospice service for comfort measures. Gradual weight loss.   HTN (hypertension) Runs low, no meds, asymptomatic.      Family/ staff Communication: plan of care reviewed with the patient and charge nurse.   Labs/tests ordered:  none  Time spend 30 minutes.

## 2023-07-09 NOTE — Assessment & Plan Note (Signed)
under hospice service for comfort measures. Gradual weight loss.

## 2023-07-10 DIAGNOSIS — I679 Cerebrovascular disease, unspecified: Secondary | ICD-10-CM | POA: Diagnosis not present

## 2023-07-10 DIAGNOSIS — R1312 Dysphagia, oropharyngeal phase: Secondary | ICD-10-CM | POA: Diagnosis not present

## 2023-07-10 DIAGNOSIS — K269 Duodenal ulcer, unspecified as acute or chronic, without hemorrhage or perforation: Secondary | ICD-10-CM | POA: Diagnosis not present

## 2023-07-10 DIAGNOSIS — R634 Abnormal weight loss: Secondary | ICD-10-CM | POA: Diagnosis not present

## 2023-07-10 DIAGNOSIS — K5900 Constipation, unspecified: Secondary | ICD-10-CM | POA: Diagnosis not present

## 2023-07-10 DIAGNOSIS — F0390 Unspecified dementia without behavioral disturbance: Secondary | ICD-10-CM | POA: Diagnosis not present

## 2023-07-10 DIAGNOSIS — F0150 Vascular dementia without behavioral disturbance: Secondary | ICD-10-CM | POA: Diagnosis not present

## 2023-07-10 NOTE — Assessment & Plan Note (Addendum)
Runs low, no meds, asymptomatic.

## 2023-07-11 DIAGNOSIS — F0390 Unspecified dementia without behavioral disturbance: Secondary | ICD-10-CM | POA: Diagnosis not present

## 2023-07-11 NOTE — Addendum Note (Signed)
Addended by: Evelise Reine X on: 07/11/2023 10:36 AM   Modules accepted: Level of Service

## 2023-07-12 DIAGNOSIS — F0390 Unspecified dementia without behavioral disturbance: Secondary | ICD-10-CM | POA: Diagnosis not present

## 2023-07-12 DIAGNOSIS — R634 Abnormal weight loss: Secondary | ICD-10-CM | POA: Diagnosis not present

## 2023-07-12 DIAGNOSIS — F0150 Vascular dementia without behavioral disturbance: Secondary | ICD-10-CM | POA: Diagnosis not present

## 2023-07-12 DIAGNOSIS — R1312 Dysphagia, oropharyngeal phase: Secondary | ICD-10-CM | POA: Diagnosis not present

## 2023-07-12 DIAGNOSIS — I679 Cerebrovascular disease, unspecified: Secondary | ICD-10-CM | POA: Diagnosis not present

## 2023-07-12 DIAGNOSIS — K5900 Constipation, unspecified: Secondary | ICD-10-CM | POA: Diagnosis not present

## 2023-07-12 DIAGNOSIS — K269 Duodenal ulcer, unspecified as acute or chronic, without hemorrhage or perforation: Secondary | ICD-10-CM | POA: Diagnosis not present

## 2023-07-14 DIAGNOSIS — F0390 Unspecified dementia without behavioral disturbance: Secondary | ICD-10-CM | POA: Diagnosis not present

## 2023-07-15 DIAGNOSIS — F0150 Vascular dementia without behavioral disturbance: Secondary | ICD-10-CM | POA: Diagnosis not present

## 2023-07-15 DIAGNOSIS — R634 Abnormal weight loss: Secondary | ICD-10-CM | POA: Diagnosis not present

## 2023-07-15 DIAGNOSIS — K5900 Constipation, unspecified: Secondary | ICD-10-CM | POA: Diagnosis not present

## 2023-07-15 DIAGNOSIS — K269 Duodenal ulcer, unspecified as acute or chronic, without hemorrhage or perforation: Secondary | ICD-10-CM | POA: Diagnosis not present

## 2023-07-15 DIAGNOSIS — R1312 Dysphagia, oropharyngeal phase: Secondary | ICD-10-CM | POA: Diagnosis not present

## 2023-07-15 DIAGNOSIS — I679 Cerebrovascular disease, unspecified: Secondary | ICD-10-CM | POA: Diagnosis not present

## 2023-07-15 DIAGNOSIS — F0390 Unspecified dementia without behavioral disturbance: Secondary | ICD-10-CM | POA: Diagnosis not present

## 2023-07-16 DIAGNOSIS — F0390 Unspecified dementia without behavioral disturbance: Secondary | ICD-10-CM | POA: Diagnosis not present

## 2023-07-17 DIAGNOSIS — R634 Abnormal weight loss: Secondary | ICD-10-CM | POA: Diagnosis not present

## 2023-07-17 DIAGNOSIS — F0150 Vascular dementia without behavioral disturbance: Secondary | ICD-10-CM | POA: Diagnosis not present

## 2023-07-17 DIAGNOSIS — K269 Duodenal ulcer, unspecified as acute or chronic, without hemorrhage or perforation: Secondary | ICD-10-CM | POA: Diagnosis not present

## 2023-07-17 DIAGNOSIS — R1312 Dysphagia, oropharyngeal phase: Secondary | ICD-10-CM | POA: Diagnosis not present

## 2023-07-17 DIAGNOSIS — F0390 Unspecified dementia without behavioral disturbance: Secondary | ICD-10-CM | POA: Diagnosis not present

## 2023-07-17 DIAGNOSIS — I679 Cerebrovascular disease, unspecified: Secondary | ICD-10-CM | POA: Diagnosis not present

## 2023-07-17 DIAGNOSIS — K5900 Constipation, unspecified: Secondary | ICD-10-CM | POA: Diagnosis not present

## 2023-07-18 DIAGNOSIS — F0390 Unspecified dementia without behavioral disturbance: Secondary | ICD-10-CM | POA: Diagnosis not present

## 2023-07-19 DIAGNOSIS — F0390 Unspecified dementia without behavioral disturbance: Secondary | ICD-10-CM | POA: Diagnosis not present

## 2023-07-19 DIAGNOSIS — K5900 Constipation, unspecified: Secondary | ICD-10-CM | POA: Diagnosis not present

## 2023-07-19 DIAGNOSIS — K269 Duodenal ulcer, unspecified as acute or chronic, without hemorrhage or perforation: Secondary | ICD-10-CM | POA: Diagnosis not present

## 2023-07-19 DIAGNOSIS — R634 Abnormal weight loss: Secondary | ICD-10-CM | POA: Diagnosis not present

## 2023-07-19 DIAGNOSIS — F0150 Vascular dementia without behavioral disturbance: Secondary | ICD-10-CM | POA: Diagnosis not present

## 2023-07-19 DIAGNOSIS — I679 Cerebrovascular disease, unspecified: Secondary | ICD-10-CM | POA: Diagnosis not present

## 2023-07-19 DIAGNOSIS — R1312 Dysphagia, oropharyngeal phase: Secondary | ICD-10-CM | POA: Diagnosis not present

## 2023-07-22 DIAGNOSIS — F0390 Unspecified dementia without behavioral disturbance: Secondary | ICD-10-CM | POA: Diagnosis not present

## 2023-07-23 DIAGNOSIS — K5900 Constipation, unspecified: Secondary | ICD-10-CM | POA: Diagnosis not present

## 2023-07-23 DIAGNOSIS — F0390 Unspecified dementia without behavioral disturbance: Secondary | ICD-10-CM | POA: Diagnosis not present

## 2023-07-23 DIAGNOSIS — F0150 Vascular dementia without behavioral disturbance: Secondary | ICD-10-CM | POA: Diagnosis not present

## 2023-07-23 DIAGNOSIS — R634 Abnormal weight loss: Secondary | ICD-10-CM | POA: Diagnosis not present

## 2023-07-23 DIAGNOSIS — K269 Duodenal ulcer, unspecified as acute or chronic, without hemorrhage or perforation: Secondary | ICD-10-CM | POA: Diagnosis not present

## 2023-07-23 DIAGNOSIS — I679 Cerebrovascular disease, unspecified: Secondary | ICD-10-CM | POA: Diagnosis not present

## 2023-07-23 DIAGNOSIS — R1312 Dysphagia, oropharyngeal phase: Secondary | ICD-10-CM | POA: Diagnosis not present

## 2023-07-24 DIAGNOSIS — I679 Cerebrovascular disease, unspecified: Secondary | ICD-10-CM | POA: Diagnosis not present

## 2023-07-24 DIAGNOSIS — R634 Abnormal weight loss: Secondary | ICD-10-CM | POA: Diagnosis not present

## 2023-07-24 DIAGNOSIS — R1312 Dysphagia, oropharyngeal phase: Secondary | ICD-10-CM | POA: Diagnosis not present

## 2023-07-24 DIAGNOSIS — K5900 Constipation, unspecified: Secondary | ICD-10-CM | POA: Diagnosis not present

## 2023-07-24 DIAGNOSIS — F0150 Vascular dementia without behavioral disturbance: Secondary | ICD-10-CM | POA: Diagnosis not present

## 2023-07-24 DIAGNOSIS — F0390 Unspecified dementia without behavioral disturbance: Secondary | ICD-10-CM | POA: Diagnosis not present

## 2023-07-24 DIAGNOSIS — K269 Duodenal ulcer, unspecified as acute or chronic, without hemorrhage or perforation: Secondary | ICD-10-CM | POA: Diagnosis not present

## 2023-07-25 DIAGNOSIS — F0390 Unspecified dementia without behavioral disturbance: Secondary | ICD-10-CM | POA: Diagnosis not present

## 2023-07-26 DIAGNOSIS — R1312 Dysphagia, oropharyngeal phase: Secondary | ICD-10-CM | POA: Diagnosis not present

## 2023-07-26 DIAGNOSIS — I679 Cerebrovascular disease, unspecified: Secondary | ICD-10-CM | POA: Diagnosis not present

## 2023-07-26 DIAGNOSIS — R634 Abnormal weight loss: Secondary | ICD-10-CM | POA: Diagnosis not present

## 2023-07-26 DIAGNOSIS — F0390 Unspecified dementia without behavioral disturbance: Secondary | ICD-10-CM | POA: Diagnosis not present

## 2023-07-26 DIAGNOSIS — F0150 Vascular dementia without behavioral disturbance: Secondary | ICD-10-CM | POA: Diagnosis not present

## 2023-07-26 DIAGNOSIS — K5900 Constipation, unspecified: Secondary | ICD-10-CM | POA: Diagnosis not present

## 2023-07-26 DIAGNOSIS — K269 Duodenal ulcer, unspecified as acute or chronic, without hemorrhage or perforation: Secondary | ICD-10-CM | POA: Diagnosis not present

## 2023-07-28 DIAGNOSIS — M159 Polyosteoarthritis, unspecified: Secondary | ICD-10-CM | POA: Diagnosis not present

## 2023-07-28 DIAGNOSIS — K269 Duodenal ulcer, unspecified as acute or chronic, without hemorrhage or perforation: Secondary | ICD-10-CM | POA: Diagnosis not present

## 2023-07-28 DIAGNOSIS — R1312 Dysphagia, oropharyngeal phase: Secondary | ICD-10-CM | POA: Diagnosis not present

## 2023-07-28 DIAGNOSIS — K5900 Constipation, unspecified: Secondary | ICD-10-CM | POA: Diagnosis not present

## 2023-07-28 DIAGNOSIS — R634 Abnormal weight loss: Secondary | ICD-10-CM | POA: Diagnosis not present

## 2023-07-28 DIAGNOSIS — I679 Cerebrovascular disease, unspecified: Secondary | ICD-10-CM | POA: Diagnosis not present

## 2023-07-28 DIAGNOSIS — F0150 Vascular dementia without behavioral disturbance: Secondary | ICD-10-CM | POA: Diagnosis not present

## 2023-07-28 DIAGNOSIS — K219 Gastro-esophageal reflux disease without esophagitis: Secondary | ICD-10-CM | POA: Diagnosis not present

## 2023-07-28 DIAGNOSIS — N3281 Overactive bladder: Secondary | ICD-10-CM | POA: Diagnosis not present

## 2023-07-28 DIAGNOSIS — R5383 Other fatigue: Secondary | ICD-10-CM | POA: Diagnosis not present

## 2023-07-31 DIAGNOSIS — R1312 Dysphagia, oropharyngeal phase: Secondary | ICD-10-CM | POA: Diagnosis not present

## 2023-07-31 DIAGNOSIS — F0150 Vascular dementia without behavioral disturbance: Secondary | ICD-10-CM | POA: Diagnosis not present

## 2023-07-31 DIAGNOSIS — R634 Abnormal weight loss: Secondary | ICD-10-CM | POA: Diagnosis not present

## 2023-07-31 DIAGNOSIS — I679 Cerebrovascular disease, unspecified: Secondary | ICD-10-CM | POA: Diagnosis not present

## 2023-07-31 DIAGNOSIS — K269 Duodenal ulcer, unspecified as acute or chronic, without hemorrhage or perforation: Secondary | ICD-10-CM | POA: Diagnosis not present

## 2023-07-31 DIAGNOSIS — K5900 Constipation, unspecified: Secondary | ICD-10-CM | POA: Diagnosis not present

## 2023-08-02 DIAGNOSIS — F0150 Vascular dementia without behavioral disturbance: Secondary | ICD-10-CM | POA: Diagnosis not present

## 2023-08-02 DIAGNOSIS — R634 Abnormal weight loss: Secondary | ICD-10-CM | POA: Diagnosis not present

## 2023-08-02 DIAGNOSIS — K269 Duodenal ulcer, unspecified as acute or chronic, without hemorrhage or perforation: Secondary | ICD-10-CM | POA: Diagnosis not present

## 2023-08-02 DIAGNOSIS — I679 Cerebrovascular disease, unspecified: Secondary | ICD-10-CM | POA: Diagnosis not present

## 2023-08-02 DIAGNOSIS — K5900 Constipation, unspecified: Secondary | ICD-10-CM | POA: Diagnosis not present

## 2023-08-02 DIAGNOSIS — R1312 Dysphagia, oropharyngeal phase: Secondary | ICD-10-CM | POA: Diagnosis not present

## 2023-08-05 ENCOUNTER — Non-Acute Institutional Stay (SKILLED_NURSING_FACILITY): Payer: Self-pay | Admitting: Sports Medicine

## 2023-08-05 ENCOUNTER — Encounter: Payer: Self-pay | Admitting: Sports Medicine

## 2023-08-05 DIAGNOSIS — M150 Primary generalized (osteo)arthritis: Secondary | ICD-10-CM | POA: Diagnosis not present

## 2023-08-05 DIAGNOSIS — F0390 Unspecified dementia without behavioral disturbance: Secondary | ICD-10-CM | POA: Diagnosis not present

## 2023-08-05 DIAGNOSIS — Z515 Encounter for palliative care: Secondary | ICD-10-CM | POA: Diagnosis not present

## 2023-08-05 DIAGNOSIS — K219 Gastro-esophageal reflux disease without esophagitis: Secondary | ICD-10-CM

## 2023-08-05 DIAGNOSIS — K5901 Slow transit constipation: Secondary | ICD-10-CM

## 2023-08-05 NOTE — Progress Notes (Signed)
Provider:  Venita Sheffield MD Location:   [name]   Place of Service:   Skilled care   PCP: Mast, Man X, NP Patient Care Team: Mast, Man X, NP as PCP - General (Internal Medicine) [name] Mast, Man X, NP as Nurse Practitioner (Nurse Practitioner) [name], MD as Attending Physician (Urology) [name], MD as Consulting Physician (Gastroenterology)  Extended Emergency Contact Information Primary Emergency Contact: Amend,[name] Address: [address]          [name] [address] [name] Home Phone: [phone] Relation: Son  Code Status:  Goals of Care: Advanced Directive information    07/09/2023   10:37 AM  Advanced Directives  Does Patient Have a Medical Advance Directive? Yes  Type of Estate agent of [place];Out of facility DNR (pink MOST or yellow form);Living will  Does patient want to make changes to medical advance directive? No - Patient declined  Copy of Healthcare Power of Attorney in Chart? Yes - validated most recent copy scanned in chart (See row information)  Pre-existing out of facility DNR order (yellow form or pink MOST form) Pink MOST/Yellow Form most recent copy in chart - Physician notified to receive inpatient order                                            Hospice related visit  No chief complaint on file.   HPI: Patient is a 89 y.o. female seen today for routine visit for chronic disease management  Pt   requires assistance from staff with ADL's, , ambulates with a walker, incontinent of bowel and bladder. Pt has significant weight loss of - Currently on regular/mechanical soft diet with thin liquids.  Her Intake is poor, typically consumes <50% of meals with some refusal. Takes Boost Breeze TID.   Pt seen and examined in the living room  Appears pleasant and comfortable and does not appear to be in distress Knows her name  Cannot remember if she had  breakfast  Does not comprehend Denies pain  PT working with her    Past Medical History:  Diagnosis Date   Allergic rhinitis due to pollen 07/01/2009   Anemia, unspecified 09/07/2010   Arthritis    Benign paroxysmal positional vertigo    Candidiasis 01/01/2012   Cramp of limb 01/11/2011   Disorder of bone and cartilage, unspecified 11/10/2010   Dizziness and giddiness 11/10/2010   Insomnia, unspecified 09/07/2010   Knee pain, left anterior    Lumbago 07/01/2009   Lump or mass in breast 08/01/2012   Memory loss 01/22/2012   Myalgia and myositis, unspecified 05/17/2011   Myopathy, unspecified    Osteoarthrosis, unspecified whether generalized or localized, lower leg    Other abnormal glucose 03/23/2010   Other chest pain 08/28/2012   Other disorders of bone and cartilage(733.99) 09/19/2012   Overactive bladder    Rash    inner legs- ? med allergy- to see PCP if doesnt improve by end of week   Rash and nonspecific skin eruption 10/28/2014   Right bundle branch block    Seborrheic keratoses 05/17/2011   Spasm of muscle 01/11/2011   Spinal stenosis, lumbar region, without neurogenic claudication    Stress incontinence 07/08/2014   Unspecified constipation 12/18/2011   Unspecified tinnitus    Unspecified urinary incontinence    Urgency of urination  Urinary frequency 09/18/2012   Past Surgical History:  Procedure Laterality Date   ABDOMINAL HYSTERECTOMY     APPENDECTOMY  1956   BACK SURGERY     lumbar-- ? type  Dr Shelle Iron   BREAST LUMPECTOMY  1993   breast cyst   BREAST SURGERY     right lumpectomy   CLEFT LIP REPAIR     with repair palate   ESOPHAGOGASTRODUODENOSCOPY Left 03/10/2013   Procedure: ESOPHAGOGASTRODUODENOSCOPY (EGD);  Surgeon: Theda Belfast, MD;  Location: [facility];  Service: Endoscopy;  Laterality: Left;   EYE SURGERY     bilateral cataract extraction with IOL   TOTAL KNEE ARTHROPLASTY  12/13/2011   Procedure: TOTAL KNEE ARTHROPLASTY;  Surgeon: Javier Docker, MD;   Location: [facility];  Service: Orthopedics;  Laterality: Left;    reports that she has never smoked. She has never used smokeless tobacco. She reports that she does not drink alcohol and does not use drugs. Social History   Socioeconomic History   Marital status: Divorced    Spouse name: Not on file   Number of children: Not on file   Years of education: Not on file   Highest education level: Not on file  Occupational History   Occupation: retired Buyer, retail  Tobacco Use   Smoking status: Never   Smokeless tobacco: Never  Substance and Sexual Activity   Alcohol use: No    Comment: socially   wine   Drug use: No   Sexual activity: Never    Birth control/protection: Post-menopausal  Other Topics Concern   Not on file  Social History Narrative   Lives at [name] since 08/2007   Divorce    Never smoked   Alcohol -rare   Exercise none    Living Will   Social Determinants of Health   Financial Resource Strain: Not on file  Food Insecurity: Not on file  Transportation Needs: Not on file  Physical Activity: Not on file  Stress: Not on file  Social Connections: Not on file  Intimate Partner Violence: Not on file    Functional Status Survey:    No family history on file.  Health Maintenance  Topic Date Due   INFLUENZA VACCINE  03/28/2023   COVID-19 Vaccine (6 - 2023-24 season) 04/28/2023   DTaP/Tdap/Td (3 - Td or Tdap) 11/01/2031   Pneumonia Vaccine 43+ Years old  Completed   DEXA SCAN  Completed   Zoster Vaccines- Shingrix  Completed   HPV VACCINES  Aged Out    No Known Allergies  Outpatient Encounter Medications as of 08/05/2023  Medication Sig   acetaminophen (TYLENOL) 500 MG tablet Take 500 mg by mouth 2 (two) times daily.   chlorhexidine (PERIDEX) 0.12 % solution Use as directed in the mouth or throat 2 (two) times daily.  Give 1 application by mouth two times a day related to OTHER LESIONS OF ORAL MUCOSA (K13.79)   omeprazole (PRILOSEC) 20 MG  capsule Take 20 mg by mouth daily.   polyethylene glycol (MIRALAX / GLYCOLAX) 17 g packet Take 17 g by mouth daily.   sodium fluoride (FLUORISHIELD) 1.1 % GEL dental gel Place 1 Application onto teeth at bedtime.   No facility-administered encounter medications on file as of 08/05/2023.    Review of Systems  Unable to perform ROS: Dementia  Constitutional:  Negative for fever.  Respiratory:  Negative for cough.   Gastrointestinal:  Negative for diarrhea and vomiting.  Genitourinary:  Negative for hematuria.  Psychiatric/Behavioral:  Negative for hallucinations.     There were no vitals filed for this visit. There is no height or weight on file to calculate BMI. Physical Exam Constitutional:      Appearance: Normal appearance.  Cardiovascular:     Rate and Rhythm: Normal rate and regular rhythm.  Pulmonary:     Effort: Pulmonary effort is normal. No respiratory distress.     Breath sounds: Normal breath sounds. No wheezing.  Abdominal:     General: Bowel sounds are normal. There is no distension.     Tenderness: There is no abdominal tenderness. There is no guarding or rebound.     Comments:    Musculoskeletal:        General: No swelling.  Neurological:     Mental Status: She is alert. Mental status is at baseline.     Comments: Alert, oriented to name Unable to move her lower extremities      Labs reviewed: Basic Metabolic Panel: Recent Labs    09/04/22 0000 04/16/23 0000  NA 141 142  K 4.2 3.7  CL 104 105  CO2 30* 26*  BUN 14 20  CREATININE 0.8 0.8  CALCIUM 9.7 9.8   Liver Function Tests: Recent Labs    09/04/22 0000 04/16/23 0000  AST 12* 16  ALT 9 16  ALKPHOS 114 154*  ALBUMIN 3.8 3.8   No results for input(s): "LIPASE", "AMYLASE" in the last 8760 hours. No results for input(s): "AMMONIA" in the last 8760 hours. CBC: Recent Labs    09/04/22 0000 04/16/23 0000  WBC 6.5 7.9  NEUTROABS 4,258.00 6,462.00  HGB 15.0 15.0  HCT 45 45  PLT 300 303    Cardiac Enzymes: No results for input(s): "CKTOTAL", "CKMB", "CKMBINDEX", "TROPONINI" in the last 8760 hours. BNP: Invalid input(s): "POCBNP" Lab Results  Component Value Date   HGBA1C 5.6 04/24/2016   Lab Results  Component Value Date   TSH 1.28 09/04/2022   Lab Results  Component Value Date   VITAMINB12 365 12/19/2017   No results found for: "FOLATE" No results found for: "IRON", "TIBC", "FERRITIN"  Imaging and Procedures obtained prior to SNF admission: NM Renal Imaging Flow W/Pharm  Result Date: 04/06/2013 *RADIOLOGY REPORT* Clinical Data:  Right-sided hydronephrosis on CT. NUCLEAR MEDICINE RENAL SCAN WITH DIURETIC ADMINISTRATION Technique:  Radionuclide angiographic and sequential renal images were obtained after intravenous injection of radiopharmaceutical. Imaging was continued during slow intravenous injection of Lasix approximately 15 minutes after the start of the examination. Radiopharmaceutical:  16 mCi Tc-77m MAG3 Comparison:  Renal ultrasound 09/23/2012.  Abdominal pelvic CT 03/10/2013. Findings: Perfusion images demonstrate early symmetric perfusion to the kidneys.  Differential renal uptake at 2-3 minutes is 50.5% on the right and 49.5% on the left. Renogram images demonstrate normal renal cortical uptake and excretion bilaterally.  There is mild to moderate dilatation of the right renal pelvis with retained contrast prior to Lasix administration.  However, this contrast washes out following Lasix. There is no significant retained contrast within the collecting system post voiding. The right renogram curve is downsloping following Lasix. Differential renal function is 50.2% right and 49.8% left.  Post Lasix T 1/2 times are 3.5 minutes on the right and 6.0 minutes on the left. IMPRESSION: 1.  Dilatation of the right renal pelvis with good washout of activity following Lasix.  No significant ureteral obstruction demonstrated. 2.  Differential renal function is nearly  symmetric. Original Report Authenticated By: Carey Bullocks, M.D.    Assessment/Plan  1. Hospice care  Pt does not seem to be in pain  Cont with supportive care    2. Slow transit constipation Cont with mirlax  3. Dementia without behavioral disturbance (HCC) No behavioral manifestations Cont with supportive care   4. Primary osteoarthritis involving multiple joints Cont with tylenol prn   5. Gastroesophageal reflux disease, unspecified whether esophagitis present Cont with Protonix    Family/ staff Communication:  care plan discussed with the nursing staff   30 Total time spent for obtaining history,  performing a medically appropriate examination and evaluation, reviewing the tests,ordering  tests,  documenting clinical information in the electronic or other health record, independently interpreting results ,care coordination (not separately reported)

## 2023-08-07 DIAGNOSIS — I679 Cerebrovascular disease, unspecified: Secondary | ICD-10-CM | POA: Diagnosis not present

## 2023-08-07 DIAGNOSIS — F0150 Vascular dementia without behavioral disturbance: Secondary | ICD-10-CM | POA: Diagnosis not present

## 2023-08-07 DIAGNOSIS — R634 Abnormal weight loss: Secondary | ICD-10-CM | POA: Diagnosis not present

## 2023-08-07 DIAGNOSIS — K269 Duodenal ulcer, unspecified as acute or chronic, without hemorrhage or perforation: Secondary | ICD-10-CM | POA: Diagnosis not present

## 2023-08-07 DIAGNOSIS — K5900 Constipation, unspecified: Secondary | ICD-10-CM | POA: Diagnosis not present

## 2023-08-07 DIAGNOSIS — R1312 Dysphagia, oropharyngeal phase: Secondary | ICD-10-CM | POA: Diagnosis not present

## 2023-08-09 DIAGNOSIS — K269 Duodenal ulcer, unspecified as acute or chronic, without hemorrhage or perforation: Secondary | ICD-10-CM | POA: Diagnosis not present

## 2023-08-09 DIAGNOSIS — R1312 Dysphagia, oropharyngeal phase: Secondary | ICD-10-CM | POA: Diagnosis not present

## 2023-08-09 DIAGNOSIS — R634 Abnormal weight loss: Secondary | ICD-10-CM | POA: Diagnosis not present

## 2023-08-09 DIAGNOSIS — K5900 Constipation, unspecified: Secondary | ICD-10-CM | POA: Diagnosis not present

## 2023-08-09 DIAGNOSIS — F0150 Vascular dementia without behavioral disturbance: Secondary | ICD-10-CM | POA: Diagnosis not present

## 2023-08-09 DIAGNOSIS — I679 Cerebrovascular disease, unspecified: Secondary | ICD-10-CM | POA: Diagnosis not present

## 2023-08-12 DIAGNOSIS — F0150 Vascular dementia without behavioral disturbance: Secondary | ICD-10-CM | POA: Diagnosis not present

## 2023-08-12 DIAGNOSIS — R634 Abnormal weight loss: Secondary | ICD-10-CM | POA: Diagnosis not present

## 2023-08-12 DIAGNOSIS — I679 Cerebrovascular disease, unspecified: Secondary | ICD-10-CM | POA: Diagnosis not present

## 2023-08-12 DIAGNOSIS — K5900 Constipation, unspecified: Secondary | ICD-10-CM | POA: Diagnosis not present

## 2023-08-12 DIAGNOSIS — R1312 Dysphagia, oropharyngeal phase: Secondary | ICD-10-CM | POA: Diagnosis not present

## 2023-08-12 DIAGNOSIS — K269 Duodenal ulcer, unspecified as acute or chronic, without hemorrhage or perforation: Secondary | ICD-10-CM | POA: Diagnosis not present

## 2023-08-14 DIAGNOSIS — K5900 Constipation, unspecified: Secondary | ICD-10-CM | POA: Diagnosis not present

## 2023-08-14 DIAGNOSIS — K269 Duodenal ulcer, unspecified as acute or chronic, without hemorrhage or perforation: Secondary | ICD-10-CM | POA: Diagnosis not present

## 2023-08-14 DIAGNOSIS — I679 Cerebrovascular disease, unspecified: Secondary | ICD-10-CM | POA: Diagnosis not present

## 2023-08-14 DIAGNOSIS — F0150 Vascular dementia without behavioral disturbance: Secondary | ICD-10-CM | POA: Diagnosis not present

## 2023-08-14 DIAGNOSIS — R634 Abnormal weight loss: Secondary | ICD-10-CM | POA: Diagnosis not present

## 2023-08-14 DIAGNOSIS — R1312 Dysphagia, oropharyngeal phase: Secondary | ICD-10-CM | POA: Diagnosis not present

## 2023-08-16 DIAGNOSIS — I679 Cerebrovascular disease, unspecified: Secondary | ICD-10-CM | POA: Diagnosis not present

## 2023-08-16 DIAGNOSIS — F0150 Vascular dementia without behavioral disturbance: Secondary | ICD-10-CM | POA: Diagnosis not present

## 2023-08-16 DIAGNOSIS — K269 Duodenal ulcer, unspecified as acute or chronic, without hemorrhage or perforation: Secondary | ICD-10-CM | POA: Diagnosis not present

## 2023-08-16 DIAGNOSIS — K5900 Constipation, unspecified: Secondary | ICD-10-CM | POA: Diagnosis not present

## 2023-08-16 DIAGNOSIS — R634 Abnormal weight loss: Secondary | ICD-10-CM | POA: Diagnosis not present

## 2023-08-16 DIAGNOSIS — R1312 Dysphagia, oropharyngeal phase: Secondary | ICD-10-CM | POA: Diagnosis not present

## 2023-08-17 DIAGNOSIS — R1312 Dysphagia, oropharyngeal phase: Secondary | ICD-10-CM | POA: Diagnosis not present

## 2023-08-17 DIAGNOSIS — R634 Abnormal weight loss: Secondary | ICD-10-CM | POA: Diagnosis not present

## 2023-08-17 DIAGNOSIS — F0150 Vascular dementia without behavioral disturbance: Secondary | ICD-10-CM | POA: Diagnosis not present

## 2023-08-17 DIAGNOSIS — K5900 Constipation, unspecified: Secondary | ICD-10-CM | POA: Diagnosis not present

## 2023-08-17 DIAGNOSIS — K269 Duodenal ulcer, unspecified as acute or chronic, without hemorrhage or perforation: Secondary | ICD-10-CM | POA: Diagnosis not present

## 2023-08-17 DIAGNOSIS — I679 Cerebrovascular disease, unspecified: Secondary | ICD-10-CM | POA: Diagnosis not present

## 2023-08-18 DIAGNOSIS — K5900 Constipation, unspecified: Secondary | ICD-10-CM | POA: Diagnosis not present

## 2023-08-18 DIAGNOSIS — K269 Duodenal ulcer, unspecified as acute or chronic, without hemorrhage or perforation: Secondary | ICD-10-CM | POA: Diagnosis not present

## 2023-08-18 DIAGNOSIS — R634 Abnormal weight loss: Secondary | ICD-10-CM | POA: Diagnosis not present

## 2023-08-18 DIAGNOSIS — I679 Cerebrovascular disease, unspecified: Secondary | ICD-10-CM | POA: Diagnosis not present

## 2023-08-18 DIAGNOSIS — F0150 Vascular dementia without behavioral disturbance: Secondary | ICD-10-CM | POA: Diagnosis not present

## 2023-08-18 DIAGNOSIS — R1312 Dysphagia, oropharyngeal phase: Secondary | ICD-10-CM | POA: Diagnosis not present

## 2023-08-20 DIAGNOSIS — I679 Cerebrovascular disease, unspecified: Secondary | ICD-10-CM | POA: Diagnosis not present

## 2023-08-20 DIAGNOSIS — R634 Abnormal weight loss: Secondary | ICD-10-CM | POA: Diagnosis not present

## 2023-08-20 DIAGNOSIS — F0150 Vascular dementia without behavioral disturbance: Secondary | ICD-10-CM | POA: Diagnosis not present

## 2023-08-20 DIAGNOSIS — K5900 Constipation, unspecified: Secondary | ICD-10-CM | POA: Diagnosis not present

## 2023-08-20 DIAGNOSIS — K269 Duodenal ulcer, unspecified as acute or chronic, without hemorrhage or perforation: Secondary | ICD-10-CM | POA: Diagnosis not present

## 2023-08-20 DIAGNOSIS — R1312 Dysphagia, oropharyngeal phase: Secondary | ICD-10-CM | POA: Diagnosis not present

## 2023-08-23 DIAGNOSIS — R1312 Dysphagia, oropharyngeal phase: Secondary | ICD-10-CM | POA: Diagnosis not present

## 2023-08-23 DIAGNOSIS — I679 Cerebrovascular disease, unspecified: Secondary | ICD-10-CM | POA: Diagnosis not present

## 2023-08-23 DIAGNOSIS — K269 Duodenal ulcer, unspecified as acute or chronic, without hemorrhage or perforation: Secondary | ICD-10-CM | POA: Diagnosis not present

## 2023-08-23 DIAGNOSIS — R634 Abnormal weight loss: Secondary | ICD-10-CM | POA: Diagnosis not present

## 2023-08-23 DIAGNOSIS — F0150 Vascular dementia without behavioral disturbance: Secondary | ICD-10-CM | POA: Diagnosis not present

## 2023-08-23 DIAGNOSIS — K5900 Constipation, unspecified: Secondary | ICD-10-CM | POA: Diagnosis not present

## 2023-08-26 DIAGNOSIS — R634 Abnormal weight loss: Secondary | ICD-10-CM | POA: Diagnosis not present

## 2023-08-26 DIAGNOSIS — I679 Cerebrovascular disease, unspecified: Secondary | ICD-10-CM | POA: Diagnosis not present

## 2023-08-26 DIAGNOSIS — K269 Duodenal ulcer, unspecified as acute or chronic, without hemorrhage or perforation: Secondary | ICD-10-CM | POA: Diagnosis not present

## 2023-08-26 DIAGNOSIS — K5900 Constipation, unspecified: Secondary | ICD-10-CM | POA: Diagnosis not present

## 2023-08-26 DIAGNOSIS — F0150 Vascular dementia without behavioral disturbance: Secondary | ICD-10-CM | POA: Diagnosis not present

## 2023-08-26 DIAGNOSIS — R1312 Dysphagia, oropharyngeal phase: Secondary | ICD-10-CM | POA: Diagnosis not present

## 2023-08-28 DIAGNOSIS — K219 Gastro-esophageal reflux disease without esophagitis: Secondary | ICD-10-CM | POA: Diagnosis not present

## 2023-08-28 DIAGNOSIS — R1312 Dysphagia, oropharyngeal phase: Secondary | ICD-10-CM | POA: Diagnosis not present

## 2023-08-28 DIAGNOSIS — F0150 Vascular dementia without behavioral disturbance: Secondary | ICD-10-CM | POA: Diagnosis not present

## 2023-08-28 DIAGNOSIS — N3281 Overactive bladder: Secondary | ICD-10-CM | POA: Diagnosis not present

## 2023-08-28 DIAGNOSIS — I679 Cerebrovascular disease, unspecified: Secondary | ICD-10-CM | POA: Diagnosis not present

## 2023-08-28 DIAGNOSIS — K5900 Constipation, unspecified: Secondary | ICD-10-CM | POA: Diagnosis not present

## 2023-08-28 DIAGNOSIS — R5383 Other fatigue: Secondary | ICD-10-CM | POA: Diagnosis not present

## 2023-08-28 DIAGNOSIS — M159 Polyosteoarthritis, unspecified: Secondary | ICD-10-CM | POA: Diagnosis not present

## 2023-08-28 DIAGNOSIS — K269 Duodenal ulcer, unspecified as acute or chronic, without hemorrhage or perforation: Secondary | ICD-10-CM | POA: Diagnosis not present

## 2023-08-28 DIAGNOSIS — R634 Abnormal weight loss: Secondary | ICD-10-CM | POA: Diagnosis not present

## 2023-08-30 DIAGNOSIS — R1312 Dysphagia, oropharyngeal phase: Secondary | ICD-10-CM | POA: Diagnosis not present

## 2023-08-30 DIAGNOSIS — R634 Abnormal weight loss: Secondary | ICD-10-CM | POA: Diagnosis not present

## 2023-08-30 DIAGNOSIS — K269 Duodenal ulcer, unspecified as acute or chronic, without hemorrhage or perforation: Secondary | ICD-10-CM | POA: Diagnosis not present

## 2023-08-30 DIAGNOSIS — I679 Cerebrovascular disease, unspecified: Secondary | ICD-10-CM | POA: Diagnosis not present

## 2023-08-30 DIAGNOSIS — K5900 Constipation, unspecified: Secondary | ICD-10-CM | POA: Diagnosis not present

## 2023-08-30 DIAGNOSIS — F0150 Vascular dementia without behavioral disturbance: Secondary | ICD-10-CM | POA: Diagnosis not present

## 2023-09-04 DIAGNOSIS — F0150 Vascular dementia without behavioral disturbance: Secondary | ICD-10-CM | POA: Diagnosis not present

## 2023-09-04 DIAGNOSIS — R634 Abnormal weight loss: Secondary | ICD-10-CM | POA: Diagnosis not present

## 2023-09-04 DIAGNOSIS — R1312 Dysphagia, oropharyngeal phase: Secondary | ICD-10-CM | POA: Diagnosis not present

## 2023-09-04 DIAGNOSIS — K5900 Constipation, unspecified: Secondary | ICD-10-CM | POA: Diagnosis not present

## 2023-09-04 DIAGNOSIS — I679 Cerebrovascular disease, unspecified: Secondary | ICD-10-CM | POA: Diagnosis not present

## 2023-09-04 DIAGNOSIS — K269 Duodenal ulcer, unspecified as acute or chronic, without hemorrhage or perforation: Secondary | ICD-10-CM | POA: Diagnosis not present

## 2023-09-05 DIAGNOSIS — K269 Duodenal ulcer, unspecified as acute or chronic, without hemorrhage or perforation: Secondary | ICD-10-CM | POA: Diagnosis not present

## 2023-09-05 DIAGNOSIS — R634 Abnormal weight loss: Secondary | ICD-10-CM | POA: Diagnosis not present

## 2023-09-05 DIAGNOSIS — R1312 Dysphagia, oropharyngeal phase: Secondary | ICD-10-CM | POA: Diagnosis not present

## 2023-09-05 DIAGNOSIS — I679 Cerebrovascular disease, unspecified: Secondary | ICD-10-CM | POA: Diagnosis not present

## 2023-09-05 DIAGNOSIS — K5900 Constipation, unspecified: Secondary | ICD-10-CM | POA: Diagnosis not present

## 2023-09-05 DIAGNOSIS — F0150 Vascular dementia without behavioral disturbance: Secondary | ICD-10-CM | POA: Diagnosis not present

## 2023-09-06 DIAGNOSIS — R634 Abnormal weight loss: Secondary | ICD-10-CM | POA: Diagnosis not present

## 2023-09-06 DIAGNOSIS — K5900 Constipation, unspecified: Secondary | ICD-10-CM | POA: Diagnosis not present

## 2023-09-06 DIAGNOSIS — R1312 Dysphagia, oropharyngeal phase: Secondary | ICD-10-CM | POA: Diagnosis not present

## 2023-09-06 DIAGNOSIS — K269 Duodenal ulcer, unspecified as acute or chronic, without hemorrhage or perforation: Secondary | ICD-10-CM | POA: Diagnosis not present

## 2023-09-06 DIAGNOSIS — F0150 Vascular dementia without behavioral disturbance: Secondary | ICD-10-CM | POA: Diagnosis not present

## 2023-09-06 DIAGNOSIS — I679 Cerebrovascular disease, unspecified: Secondary | ICD-10-CM | POA: Diagnosis not present

## 2023-09-10 DIAGNOSIS — K269 Duodenal ulcer, unspecified as acute or chronic, without hemorrhage or perforation: Secondary | ICD-10-CM | POA: Diagnosis not present

## 2023-09-10 DIAGNOSIS — F0150 Vascular dementia without behavioral disturbance: Secondary | ICD-10-CM | POA: Diagnosis not present

## 2023-09-10 DIAGNOSIS — R634 Abnormal weight loss: Secondary | ICD-10-CM | POA: Diagnosis not present

## 2023-09-10 DIAGNOSIS — R1312 Dysphagia, oropharyngeal phase: Secondary | ICD-10-CM | POA: Diagnosis not present

## 2023-09-10 DIAGNOSIS — I679 Cerebrovascular disease, unspecified: Secondary | ICD-10-CM | POA: Diagnosis not present

## 2023-09-10 DIAGNOSIS — K5900 Constipation, unspecified: Secondary | ICD-10-CM | POA: Diagnosis not present

## 2023-09-11 DIAGNOSIS — K5900 Constipation, unspecified: Secondary | ICD-10-CM | POA: Diagnosis not present

## 2023-09-11 DIAGNOSIS — F0150 Vascular dementia without behavioral disturbance: Secondary | ICD-10-CM | POA: Diagnosis not present

## 2023-09-11 DIAGNOSIS — R634 Abnormal weight loss: Secondary | ICD-10-CM | POA: Diagnosis not present

## 2023-09-11 DIAGNOSIS — R1312 Dysphagia, oropharyngeal phase: Secondary | ICD-10-CM | POA: Diagnosis not present

## 2023-09-11 DIAGNOSIS — I679 Cerebrovascular disease, unspecified: Secondary | ICD-10-CM | POA: Diagnosis not present

## 2023-09-11 DIAGNOSIS — K269 Duodenal ulcer, unspecified as acute or chronic, without hemorrhage or perforation: Secondary | ICD-10-CM | POA: Diagnosis not present

## 2023-09-13 ENCOUNTER — Encounter: Payer: Self-pay | Admitting: Nurse Practitioner

## 2023-09-13 ENCOUNTER — Non-Acute Institutional Stay (SKILLED_NURSING_FACILITY): Payer: Self-pay | Admitting: Nurse Practitioner

## 2023-09-13 DIAGNOSIS — K5901 Slow transit constipation: Secondary | ICD-10-CM | POA: Diagnosis not present

## 2023-09-13 DIAGNOSIS — I10 Essential (primary) hypertension: Secondary | ICD-10-CM | POA: Diagnosis not present

## 2023-09-13 DIAGNOSIS — F0390 Unspecified dementia without behavioral disturbance: Secondary | ICD-10-CM | POA: Diagnosis not present

## 2023-09-13 DIAGNOSIS — K5900 Constipation, unspecified: Secondary | ICD-10-CM | POA: Diagnosis not present

## 2023-09-13 DIAGNOSIS — R634 Abnormal weight loss: Secondary | ICD-10-CM | POA: Diagnosis not present

## 2023-09-13 DIAGNOSIS — R627 Adult failure to thrive: Secondary | ICD-10-CM

## 2023-09-13 DIAGNOSIS — R1312 Dysphagia, oropharyngeal phase: Secondary | ICD-10-CM | POA: Diagnosis not present

## 2023-09-13 DIAGNOSIS — K269 Duodenal ulcer, unspecified as acute or chronic, without hemorrhage or perforation: Secondary | ICD-10-CM

## 2023-09-13 DIAGNOSIS — I679 Cerebrovascular disease, unspecified: Secondary | ICD-10-CM | POA: Diagnosis not present

## 2023-09-13 DIAGNOSIS — M150 Primary generalized (osteo)arthritis: Secondary | ICD-10-CM | POA: Diagnosis not present

## 2023-09-13 DIAGNOSIS — F0150 Vascular dementia without behavioral disturbance: Secondary | ICD-10-CM | POA: Diagnosis not present

## 2023-09-13 NOTE — Assessment & Plan Note (Signed)
 under hospice service for comfort measures. Gradual weight loss.

## 2023-09-13 NOTE — Assessment & Plan Note (Signed)
Stable, GERD/PUD/gastric erosions, stable, on Omeprazole,  Hgb 15 04/16/23

## 2023-09-13 NOTE — Assessment & Plan Note (Signed)
Stable, continue MiraLax.  °

## 2023-09-13 NOTE — Progress Notes (Unsigned)
Location:   [facility] Room Number: 22 Place of Service:  SNF (31) Provider: Arna Snipe Harvey Lingo NP  Chianne Byrns X, NP  Patient Care Team: [name] X, NP as PCP - General (Internal Medicine) [name] [name] X, NP as Nurse Practitioner (Nurse Practitioner) [name], MD as Attending Physician (Urology) [name], MD as Consulting Physician (Gastroenterology)  Extended Emergency Contact Information Primary Emergency Contact: [name],[name] Address: [address]          [place] [address] [name] Home Phone: [phone] Relation: Son  Code Status:  DNR Goals of care: Advanced Directive information    07/09/2023   10:37 AM  Advanced Directives  Does Patient Have a Medical Advance Directive? Yes  Type of Estate agent of [place];Out of facility DNR (pink MOST or yellow form);Living will  Does patient want to make changes to medical advance directive? No - Patient declined  Copy of Healthcare Power of Attorney in Chart? Yes - validated most recent copy scanned in chart (See row information)  Pre-existing out of facility DNR order (yellow form or pink MOST form) Pink MOST/Yellow Form most recent copy in chart - Physician notified to receive inpatient order     Chief Complaint  Patient presents with   Medical Management of Chronic Issues    HPI:  Pt is a 89 y.o. female seen today for medical management of chronic diseases.     Failure to thrive, under hospice service for comfort measures. Gradual weight loss.  Dementia, off Memantine for memory. MMSE 20/30 09/10/19. TSH 1.28 09/04/22             Hx of OA, s/p L TKR 2013,  on Tylenol, w/c for mobility              Gait abnormalit, w/c for mobility             GERD/PUD/gastric erosions, stable, on Omeprazole,  Hgb 15 04/16/23             Urinary frequency, not taking meds             Constipation, stable, MiraLax daily.              BPPV no recent  occurrence.                  Past Medical History:  Diagnosis Date   Allergic rhinitis due to pollen 07/01/2009   Anemia, unspecified 09/07/2010   Arthritis    Benign paroxysmal positional vertigo    Candidiasis 01/01/2012   Cramp of limb 01/11/2011   Disorder of bone and cartilage, unspecified 11/10/2010   Dizziness and giddiness 11/10/2010   Insomnia, unspecified 09/07/2010   Knee pain, left anterior    Lumbago 07/01/2009   Lump or mass in breast 08/01/2012   Memory loss 01/22/2012   Myalgia and myositis, unspecified 05/17/2011   Myopathy, unspecified    Osteoarthrosis, unspecified whether generalized or localized, lower leg    Other abnormal glucose 03/23/2010   Other chest pain 08/28/2012   Other disorders of bone and cartilage(733.99) 09/19/2012   Overactive bladder    Rash    inner legs- ? med allergy- to see PCP if doesnt improve by end of week   Rash and nonspecific skin eruption 10/28/2014   Right bundle branch block    Seborrheic keratoses 05/17/2011   Spasm of muscle 01/11/2011   Spinal stenosis, lumbar region, without neurogenic claudication  Stress incontinence 07/08/2014   Unspecified constipation 12/18/2011   Unspecified tinnitus    Unspecified urinary incontinence    Urgency of urination    Urinary frequency 09/18/2012   Past Surgical History:  Procedure Laterality Date   ABDOMINAL HYSTERECTOMY     APPENDECTOMY  1956   BACK SURGERY     lumbar-- ? type  Dr Shelle Iron   BREAST LUMPECTOMY  1993   breast cyst   BREAST SURGERY     right lumpectomy   CLEFT LIP REPAIR     with repair palate   ESOPHAGOGASTRODUODENOSCOPY Left 03/10/2013   Procedure: ESOPHAGOGASTRODUODENOSCOPY (EGD);  Surgeon: Theda Belfast, MD;  Location: [facility];  Service: Endoscopy;  Laterality: Left;   EYE SURGERY     bilateral cataract extraction with IOL   TOTAL KNEE ARTHROPLASTY  12/13/2011   Procedure: TOTAL KNEE ARTHROPLASTY;  Surgeon: Javier Docker, MD;  Location: [facility];  Service: Orthopedics;   Laterality: Left;    No Known Allergies  Allergies as of 09/13/2023   No Known Allergies      Medication List        Accurate as of September 13, 2023 12:04 PM. If you have any questions, ask your nurse or doctor.          acetaminophen 500 MG tablet Commonly known as: TYLENOL Take 500 mg by mouth 2 (two) times daily.   chlorhexidine 0.12 % solution Commonly known as: PERIDEX Use as directed in the mouth or throat 2 (two) times daily.  Give 1 application by mouth two times a day related to OTHER LESIONS OF ORAL MUCOSA (K13.79)   omeprazole 20 MG capsule Commonly known as: PRILOSEC Take 20 mg by mouth daily.   polyethylene glycol 17 g packet Commonly known as: MIRALAX / GLYCOLAX Take 17 g by mouth daily.   sodium fluoride 1.1 % Gel dental gel Commonly known as: FLUORISHIELD Place 1 Application onto teeth at bedtime.        Review of Systems  Unable to perform ROS: Dementia    Immunization History  Administered Date(s) Administered   Influenza Whole 05/27/2012, 06/10/2013, 06/06/2018   Influenza, High Dose Seasonal PF 05/30/2019, 06/25/2022   Influenza-Unspecified 06/14/2014, 05/26/2015, 06/13/2016, 06/17/2017, 06/08/2020, 06/14/2021   Moderna SARS-COV2 Booster Vaccination 01/12/2022, 06/28/2022   Moderna Sars-Covid-2 Vaccination 08/29/2019, 09/26/2019, 07/05/2020, 01/24/2021   Pfizer Covid-19 Vaccine Bivalent Booster 38yrs & up 05/16/2021   Pneumococcal Conjugate-13 06/24/2017   Pneumococcal Polysaccharide-23 10/03/2011   Td 10/03/2011   Tdap 10/31/2021   Zoster Recombinant(Shingrix) 10/31/2021, 01/31/2022   Zoster, Live 08/28/2007   Pertinent  Health Maintenance Due  Topic Date Due   INFLUENZA VACCINE  03/28/2023   DEXA SCAN  Completed      09/15/2015    3:29 PM 05/17/2017    9:30 AM 03/27/2019    5:06 PM 05/29/2022    3:06 PM 02/25/2023   10:32 AM  Fall Risk  Falls in the past year? No No -- 0 0  Number of falls in past year - Comments   Emmi  Telephone Survey Actual Response =     Was there an injury with Fall?    0 0  Fall Risk Category Calculator    0 0  Fall Risk Category (Retired)    Low   (RETIRED) Patient Fall Risk Level    Low fall risk   Patient at Risk for Falls Due to    No Fall Risks No Fall Risks  Fall risk Follow up  Falls evaluation completed Falls evaluation completed   Functional Status Survey:    Vitals:   09/13/23 1128  BP: (!) 111/58  Pulse: 73  Resp: 17  Temp: (!) 97 F (36.1 C)  SpO2: 97%  Weight: 126 lb 11.2 oz (57.5 kg)   Body mass index is 20.45 kg/m. Physical Exam Vitals and nursing note reviewed.  Constitutional:      Appearance: Normal appearance.  HENT:     Head: Normocephalic and atraumatic.     Mouth/Throat:     Mouth: Mucous membranes are moist.  Eyes:     Extraocular Movements: Extraocular movements intact.     Conjunctiva/sclera:     Right eye: Right conjunctiva is not injected.     Left eye: Left conjunctiva is not injected.     Pupils: Pupils are equal, round, and reactive to light.  Cardiovascular:     Rate and Rhythm: Normal rate and regular rhythm.     Pulses: Normal pulses.     Heart sounds: Normal heart sounds. No murmur heard. Pulmonary:     Effort: Pulmonary effort is normal.     Breath sounds: Normal breath sounds. No rales.  Abdominal:     General: Bowel sounds are normal. There is no distension.     Palpations: Abdomen is soft.     Tenderness: There is no abdominal tenderness. There is no guarding.  Musculoskeletal:     Cervical back: Normal range of motion and neck supple.     Right lower leg: No edema.     Left lower leg: No edema.     Comments: Chronic left knee pain, w/c for mobility.   Skin:    General: Skin is warm and dry.  Neurological:     General: No focal deficit present.     Mental Status: She is alert. Mental status is at baseline.     Gait: Gait abnormal.     Comments: Oriented to self, her room.   Psychiatric:        Mood and Affect:  Mood normal.        Behavior: Behavior normal.     Labs reviewed: Recent Labs    04/16/23 0000  NA 142  K 3.7  CL 105  CO2 26*  BUN 20  CREATININE 0.8  CALCIUM 9.8   Recent Labs    04/16/23 0000  AST 16  ALT 16  ALKPHOS 154*  ALBUMIN 3.8   Recent Labs    04/16/23 0000  WBC 7.9  NEUTROABS 6,462.00  HGB 15.0  HCT 45  PLT 303   Lab Results  Component Value Date   TSH 1.28 09/04/2022   Lab Results  Component Value Date   HGBA1C 5.6 04/24/2016   Lab Results  Component Value Date   CHOL 189 09/06/2015   HDL 65 09/06/2015   LDLCALC 99 09/06/2015   TRIG 126 09/06/2015    Significant Diagnostic Results in last 30 days:  No results found.  Assessment/Plan  No problem-specific Assessment & Plan notes found for this encounter.   Family/ staff Communication: plan of care reviewed with the patient and charge nurse.   Labs/tests ordered:  none  Time spend 30 minutes.

## 2023-09-13 NOTE — Assessment & Plan Note (Signed)
Hx of OA, s/p L TKR 2013,  on Tylenol, w/c for mobility              Gait abnormalit, w/c for mobility

## 2023-09-13 NOTE — Assessment & Plan Note (Signed)
off Memantine for memory. MMSE 20/30 09/10/19. TSH 1.28 09/04/22

## 2023-09-16 NOTE — Assessment & Plan Note (Addendum)
Blood pressure runs low, asymptomatic, not taking antihypertensive agent.

## 2023-09-17 DIAGNOSIS — R634 Abnormal weight loss: Secondary | ICD-10-CM | POA: Diagnosis not present

## 2023-09-17 DIAGNOSIS — K269 Duodenal ulcer, unspecified as acute or chronic, without hemorrhage or perforation: Secondary | ICD-10-CM | POA: Diagnosis not present

## 2023-09-17 DIAGNOSIS — I679 Cerebrovascular disease, unspecified: Secondary | ICD-10-CM | POA: Diagnosis not present

## 2023-09-17 DIAGNOSIS — F0150 Vascular dementia without behavioral disturbance: Secondary | ICD-10-CM | POA: Diagnosis not present

## 2023-09-17 DIAGNOSIS — K5900 Constipation, unspecified: Secondary | ICD-10-CM | POA: Diagnosis not present

## 2023-09-17 DIAGNOSIS — R1312 Dysphagia, oropharyngeal phase: Secondary | ICD-10-CM | POA: Diagnosis not present

## 2023-09-18 DIAGNOSIS — K5900 Constipation, unspecified: Secondary | ICD-10-CM | POA: Diagnosis not present

## 2023-09-18 DIAGNOSIS — R1312 Dysphagia, oropharyngeal phase: Secondary | ICD-10-CM | POA: Diagnosis not present

## 2023-09-18 DIAGNOSIS — F0150 Vascular dementia without behavioral disturbance: Secondary | ICD-10-CM | POA: Diagnosis not present

## 2023-09-18 DIAGNOSIS — I679 Cerebrovascular disease, unspecified: Secondary | ICD-10-CM | POA: Diagnosis not present

## 2023-09-18 DIAGNOSIS — R634 Abnormal weight loss: Secondary | ICD-10-CM | POA: Diagnosis not present

## 2023-09-18 DIAGNOSIS — K269 Duodenal ulcer, unspecified as acute or chronic, without hemorrhage or perforation: Secondary | ICD-10-CM | POA: Diagnosis not present

## 2023-09-20 DIAGNOSIS — R1312 Dysphagia, oropharyngeal phase: Secondary | ICD-10-CM | POA: Diagnosis not present

## 2023-09-20 DIAGNOSIS — K269 Duodenal ulcer, unspecified as acute or chronic, without hemorrhage or perforation: Secondary | ICD-10-CM | POA: Diagnosis not present

## 2023-09-20 DIAGNOSIS — F0150 Vascular dementia without behavioral disturbance: Secondary | ICD-10-CM | POA: Diagnosis not present

## 2023-09-20 DIAGNOSIS — I679 Cerebrovascular disease, unspecified: Secondary | ICD-10-CM | POA: Diagnosis not present

## 2023-09-20 DIAGNOSIS — K5900 Constipation, unspecified: Secondary | ICD-10-CM | POA: Diagnosis not present

## 2023-09-20 DIAGNOSIS — R634 Abnormal weight loss: Secondary | ICD-10-CM | POA: Diagnosis not present

## 2023-09-24 DIAGNOSIS — F0150 Vascular dementia without behavioral disturbance: Secondary | ICD-10-CM | POA: Diagnosis not present

## 2023-09-24 DIAGNOSIS — K269 Duodenal ulcer, unspecified as acute or chronic, without hemorrhage or perforation: Secondary | ICD-10-CM | POA: Diagnosis not present

## 2023-09-24 DIAGNOSIS — K5900 Constipation, unspecified: Secondary | ICD-10-CM | POA: Diagnosis not present

## 2023-09-24 DIAGNOSIS — I679 Cerebrovascular disease, unspecified: Secondary | ICD-10-CM | POA: Diagnosis not present

## 2023-09-24 DIAGNOSIS — R634 Abnormal weight loss: Secondary | ICD-10-CM | POA: Diagnosis not present

## 2023-09-24 DIAGNOSIS — R1312 Dysphagia, oropharyngeal phase: Secondary | ICD-10-CM | POA: Diagnosis not present

## 2023-09-25 DIAGNOSIS — K5900 Constipation, unspecified: Secondary | ICD-10-CM | POA: Diagnosis not present

## 2023-09-25 DIAGNOSIS — I679 Cerebrovascular disease, unspecified: Secondary | ICD-10-CM | POA: Diagnosis not present

## 2023-09-25 DIAGNOSIS — K269 Duodenal ulcer, unspecified as acute or chronic, without hemorrhage or perforation: Secondary | ICD-10-CM | POA: Diagnosis not present

## 2023-09-25 DIAGNOSIS — R634 Abnormal weight loss: Secondary | ICD-10-CM | POA: Diagnosis not present

## 2023-09-25 DIAGNOSIS — R1312 Dysphagia, oropharyngeal phase: Secondary | ICD-10-CM | POA: Diagnosis not present

## 2023-09-25 DIAGNOSIS — F0150 Vascular dementia without behavioral disturbance: Secondary | ICD-10-CM | POA: Diagnosis not present

## 2023-09-27 DIAGNOSIS — K269 Duodenal ulcer, unspecified as acute or chronic, without hemorrhage or perforation: Secondary | ICD-10-CM | POA: Diagnosis not present

## 2023-09-27 DIAGNOSIS — I679 Cerebrovascular disease, unspecified: Secondary | ICD-10-CM | POA: Diagnosis not present

## 2023-09-27 DIAGNOSIS — R1312 Dysphagia, oropharyngeal phase: Secondary | ICD-10-CM | POA: Diagnosis not present

## 2023-09-27 DIAGNOSIS — F0150 Vascular dementia without behavioral disturbance: Secondary | ICD-10-CM | POA: Diagnosis not present

## 2023-09-27 DIAGNOSIS — K5900 Constipation, unspecified: Secondary | ICD-10-CM | POA: Diagnosis not present

## 2023-09-27 DIAGNOSIS — R634 Abnormal weight loss: Secondary | ICD-10-CM | POA: Diagnosis not present

## 2023-09-28 DIAGNOSIS — N3281 Overactive bladder: Secondary | ICD-10-CM | POA: Diagnosis not present

## 2023-09-28 DIAGNOSIS — F0150 Vascular dementia without behavioral disturbance: Secondary | ICD-10-CM | POA: Diagnosis not present

## 2023-09-28 DIAGNOSIS — R5383 Other fatigue: Secondary | ICD-10-CM | POA: Diagnosis not present

## 2023-09-28 DIAGNOSIS — K5900 Constipation, unspecified: Secondary | ICD-10-CM | POA: Diagnosis not present

## 2023-09-28 DIAGNOSIS — M159 Polyosteoarthritis, unspecified: Secondary | ICD-10-CM | POA: Diagnosis not present

## 2023-09-28 DIAGNOSIS — I679 Cerebrovascular disease, unspecified: Secondary | ICD-10-CM | POA: Diagnosis not present

## 2023-09-28 DIAGNOSIS — K219 Gastro-esophageal reflux disease without esophagitis: Secondary | ICD-10-CM | POA: Diagnosis not present

## 2023-09-28 DIAGNOSIS — R1312 Dysphagia, oropharyngeal phase: Secondary | ICD-10-CM | POA: Diagnosis not present

## 2023-09-28 DIAGNOSIS — R634 Abnormal weight loss: Secondary | ICD-10-CM | POA: Diagnosis not present

## 2023-09-28 DIAGNOSIS — K269 Duodenal ulcer, unspecified as acute or chronic, without hemorrhage or perforation: Secondary | ICD-10-CM | POA: Diagnosis not present

## 2023-10-01 DIAGNOSIS — K269 Duodenal ulcer, unspecified as acute or chronic, without hemorrhage or perforation: Secondary | ICD-10-CM | POA: Diagnosis not present

## 2023-10-01 DIAGNOSIS — K5900 Constipation, unspecified: Secondary | ICD-10-CM | POA: Diagnosis not present

## 2023-10-01 DIAGNOSIS — I679 Cerebrovascular disease, unspecified: Secondary | ICD-10-CM | POA: Diagnosis not present

## 2023-10-01 DIAGNOSIS — R634 Abnormal weight loss: Secondary | ICD-10-CM | POA: Diagnosis not present

## 2023-10-01 DIAGNOSIS — R1312 Dysphagia, oropharyngeal phase: Secondary | ICD-10-CM | POA: Diagnosis not present

## 2023-10-01 DIAGNOSIS — F0150 Vascular dementia without behavioral disturbance: Secondary | ICD-10-CM | POA: Diagnosis not present

## 2023-10-02 DIAGNOSIS — R634 Abnormal weight loss: Secondary | ICD-10-CM | POA: Diagnosis not present

## 2023-10-02 DIAGNOSIS — F0150 Vascular dementia without behavioral disturbance: Secondary | ICD-10-CM | POA: Diagnosis not present

## 2023-10-02 DIAGNOSIS — K269 Duodenal ulcer, unspecified as acute or chronic, without hemorrhage or perforation: Secondary | ICD-10-CM | POA: Diagnosis not present

## 2023-10-02 DIAGNOSIS — R1312 Dysphagia, oropharyngeal phase: Secondary | ICD-10-CM | POA: Diagnosis not present

## 2023-10-02 DIAGNOSIS — I679 Cerebrovascular disease, unspecified: Secondary | ICD-10-CM | POA: Diagnosis not present

## 2023-10-02 DIAGNOSIS — K5900 Constipation, unspecified: Secondary | ICD-10-CM | POA: Diagnosis not present

## 2023-10-04 DIAGNOSIS — F0150 Vascular dementia without behavioral disturbance: Secondary | ICD-10-CM | POA: Diagnosis not present

## 2023-10-04 DIAGNOSIS — R1312 Dysphagia, oropharyngeal phase: Secondary | ICD-10-CM | POA: Diagnosis not present

## 2023-10-04 DIAGNOSIS — K269 Duodenal ulcer, unspecified as acute or chronic, without hemorrhage or perforation: Secondary | ICD-10-CM | POA: Diagnosis not present

## 2023-10-04 DIAGNOSIS — I679 Cerebrovascular disease, unspecified: Secondary | ICD-10-CM | POA: Diagnosis not present

## 2023-10-04 DIAGNOSIS — K5900 Constipation, unspecified: Secondary | ICD-10-CM | POA: Diagnosis not present

## 2023-10-04 DIAGNOSIS — R634 Abnormal weight loss: Secondary | ICD-10-CM | POA: Diagnosis not present

## 2023-10-07 ENCOUNTER — Non-Acute Institutional Stay (SKILLED_NURSING_FACILITY): Payer: Medicare Other | Admitting: Nurse Practitioner

## 2023-10-07 ENCOUNTER — Encounter: Payer: Self-pay | Admitting: Nurse Practitioner

## 2023-10-07 DIAGNOSIS — K257 Chronic gastric ulcer without hemorrhage or perforation: Secondary | ICD-10-CM

## 2023-10-07 DIAGNOSIS — L8995 Pressure ulcer of unspecified site, unstageable: Secondary | ICD-10-CM

## 2023-10-07 DIAGNOSIS — K5901 Slow transit constipation: Secondary | ICD-10-CM

## 2023-10-07 DIAGNOSIS — F0390 Unspecified dementia without behavioral disturbance: Secondary | ICD-10-CM

## 2023-10-07 DIAGNOSIS — R627 Adult failure to thrive: Secondary | ICD-10-CM | POA: Diagnosis not present

## 2023-10-07 DIAGNOSIS — W19XXXA Unspecified fall, initial encounter: Secondary | ICD-10-CM

## 2023-10-07 NOTE — Assessment & Plan Note (Signed)
 stable, MiraLax daily.

## 2023-10-07 NOTE — Assessment & Plan Note (Signed)
 GERD/PUD/gastric erosions, stable, on Omeprazole,  Hgb 15 04/16/23

## 2023-10-07 NOTE — Assessment & Plan Note (Signed)
 Hx of OA, s/p L TKR 2013,  on Tylenol , w/c for mobility              Gait abnormalit, w/c for mobility, risk of falling, last fall 10/06/23 when the patient was found lying on floor beside of her bed, the bed was in lowest position

## 2023-10-07 NOTE — Assessment & Plan Note (Signed)
 under hospice service for comfort measures. Gradual weight loss.

## 2023-10-07 NOTE — Progress Notes (Signed)
Location:   SNF [facility] Room Number: 22 Place of Service:  SNF (31) Provider: Arna Snipe Markella Dao NP  Syriah Delisi X, NP  Patient Care Team: Ahmani Prehn X, NP as PCP - General (Internal Medicine) [name] Joice Nazario X, NP as Nurse Practitioner (Nurse Practitioner) [name], MD as Attending Physician (Urology) [name], MD as Consulting Physician (Gastroenterology)  Extended Emergency Contact Information Primary Emergency Contact: Helbig,[name] Address: [address]          [name] [address] [name] Home Phone: [phone] Relation: Son  Code Status:  DNR Goals of care: Advanced Directive information    07/09/2023   10:37 AM  Advanced Directives  Does Patient Have a Medical Advance Directive? Yes  Type of Estate agent of [place];Out of facility DNR (pink MOST or yellow form);Living will  Does patient want to make changes to medical advance directive? No - Patient declined  Copy of Healthcare Power of Attorney in Chart? Yes - validated most recent copy scanned in chart (See row information)  Pre-existing out of facility DNR order (yellow form or pink MOST form) Pink MOST/Yellow Form most recent copy in chart - Physician notified to receive inpatient order     Chief Complaint  Patient presents with   Medical Management of Chronic Issues    HPI:  Pt is a 89 y.o. female seen today for medical management of chronic diseases.     Failure to thrive, under hospice service for comfort measures. Gradual weight loss.  Dementia, off Memantine for memory. MMSE 20/30 09/10/19. TSH 1.28 09/04/22             Hx of OA, s/p L TKR 2013,  on Tylenol, w/c for mobility              Gait abnormalit, w/c for mobility, risk of falling, last fall 10/06/23 when the patient was found lying on floor beside of her bed, the bed was in lowest position             GERD/PUD/gastric erosions, stable, on Omeprazole,  Hgb 15 04/16/23              Urinary frequency, not taking meds             Constipation, stable, MiraLax daily.              BPPV no recent occurrence.   Past Medical History:  Diagnosis Date   Allergic rhinitis due to pollen 07/01/2009   Anemia, unspecified 09/07/2010   Arthritis    Benign paroxysmal positional vertigo    Candidiasis 01/01/2012   Cramp of limb 01/11/2011   Disorder of bone and cartilage, unspecified 11/10/2010   Dizziness and giddiness 11/10/2010   Insomnia, unspecified 09/07/2010   Knee pain, left anterior    Lumbago 07/01/2009   Lump or mass in breast 08/01/2012   Memory loss 01/22/2012   Myalgia and myositis, unspecified 05/17/2011   Myopathy, unspecified    Osteoarthrosis, unspecified whether generalized or localized, lower leg    Other abnormal glucose 03/23/2010   Other chest pain 08/28/2012   Other disorders of bone and cartilage(733.99) 09/19/2012   Overactive bladder    Rash    inner legs- ? med allergy- to see PCP if doesnt improve by end of week   Rash and nonspecific skin eruption 10/28/2014   Right bundle branch block    Seborrheic keratoses 05/17/2011   Spasm of muscle 01/11/2011   Spinal  stenosis, lumbar region, without neurogenic claudication    Stress incontinence 07/08/2014   Unspecified constipation 12/18/2011   Unspecified tinnitus    Unspecified urinary incontinence    Urgency of urination    Urinary frequency 09/18/2012   Past Surgical History:  Procedure Laterality Date   ABDOMINAL HYSTERECTOMY     APPENDECTOMY  1956   BACK SURGERY     lumbar-- ? type  Dr Shelle Iron   BREAST LUMPECTOMY  1993   breast cyst   BREAST SURGERY     right lumpectomy   CLEFT LIP REPAIR     with repair palate   ESOPHAGOGASTRODUODENOSCOPY Left 03/10/2013   Procedure: ESOPHAGOGASTRODUODENOSCOPY (EGD);  Surgeon: Theda Belfast, MD;  Location: [facility];  Service: Endoscopy;  Laterality: Left;   EYE SURGERY     bilateral cataract extraction with IOL   TOTAL KNEE ARTHROPLASTY  12/13/2011   Procedure:  TOTAL KNEE ARTHROPLASTY;  Surgeon: Javier Docker, MD;  Location: [facility];  Service: Orthopedics;  Laterality: Left;    No Known Allergies  Allergies as of 10/07/2023   No Known Allergies      Medication List        Accurate as of October 07, 2023 11:59 PM. If you have any questions, ask your nurse or doctor.          acetaminophen 500 MG tablet Commonly known as: TYLENOL Take 500 mg by mouth 2 (two) times daily.   chlorhexidine 0.12 % solution Commonly known as: PERIDEX Use as directed in the mouth or throat 2 (two) times daily.  Give 1 application by mouth two times a day related to OTHER LESIONS OF ORAL MUCOSA (K13.79)   omeprazole 20 MG capsule Commonly known as: PRILOSEC Take 20 mg by mouth daily.   polyethylene glycol 17 g packet Commonly known as: MIRALAX / GLYCOLAX Take 17 g by mouth daily.   sodium fluoride 1.1 % Gel dental gel Commonly known as: FLUORISHIELD Place 1 Application onto teeth at bedtime.        Review of Systems  Unable to perform ROS: Dementia    Immunization History  Administered Date(s) Administered   Influenza Whole 05/27/2012, 06/10/2013, 06/06/2018   Influenza, High Dose Seasonal PF 05/30/2019, 06/25/2022   Influenza-Unspecified 06/14/2014, 05/26/2015, 06/13/2016, 06/17/2017, 06/08/2020, 06/14/2021   Moderna SARS-COV2 Booster Vaccination 01/12/2022, 06/28/2022   Moderna Sars-Covid-2 Vaccination 08/29/2019, 09/26/2019, 07/05/2020, 01/24/2021   Pfizer Covid-19 Vaccine Bivalent Booster 54yrs & up 05/16/2021   Pneumococcal Conjugate-13 06/24/2017   Pneumococcal Polysaccharide-23 10/03/2011   Td 10/03/2011   Tdap 10/31/2021   Zoster Recombinant(Shingrix) 10/31/2021, 01/31/2022   Zoster, Live 08/28/2007   Pertinent  Health Maintenance Due  Topic Date Due   INFLUENZA VACCINE  03/28/2023   DEXA SCAN  Completed      09/15/2015    3:29 PM 05/17/2017    9:30 AM 03/27/2019    5:06 PM 05/29/2022    3:06 PM 02/25/2023   10:32 AM   Fall Risk  Falls in the past year? No No -- 0 0  Number of falls in past year - Comments   Emmi Telephone Survey Actual Response =     Was there an injury with Fall?    0 0  Fall Risk Category Calculator    0 0  Fall Risk Category (Retired)    Low   (RETIRED) Patient Fall Risk Level    Low fall risk   Patient at Risk for Falls Due to    No Fall  Risks No Fall Risks  Fall risk Follow up    Falls evaluation completed Falls evaluation completed   Functional Status Survey:    Vitals:   10/07/23 1402  BP: 102/66  Pulse: 85  Resp: 16  Temp: 98.2 F (36.8 C)  SpO2: 97%  Weight: 117 lb 8 oz (53.3 kg)   Body mass index is 18.96 kg/m. Physical Exam Vitals and nursing note reviewed.  Constitutional:      Appearance: Normal appearance.  HENT:     Head: Normocephalic and atraumatic.     Mouth/Throat:     Mouth: Mucous membranes are moist.  Eyes:     Extraocular Movements: Extraocular movements intact.     Conjunctiva/sclera:     Right eye: Right conjunctiva is not injected.     Left eye: Left conjunctiva is not injected.     Pupils: Pupils are equal, round, and reactive to light.  Cardiovascular:     Rate and Rhythm: Normal rate and regular rhythm.     Pulses: Normal pulses.     Heart sounds: Normal heart sounds. No murmur heard. Pulmonary:     Effort: Pulmonary effort is normal.     Breath sounds: Normal breath sounds. No rales.  Abdominal:     General: Bowel sounds are normal. There is no distension.     Palpations: Abdomen is soft.     Tenderness: There is no abdominal tenderness. There is no guarding.  Musculoskeletal:     Cervical back: Normal range of motion and neck supple.     Right lower leg: No edema.     Left lower leg: No edema.     Comments: Chronic left knee pain, w/c for mobility.   Skin:    General: Skin is warm and dry.     Comments: R+L sit bone pressure ulcers.   Neurological:     General: No focal deficit present.     Mental Status: She is alert.  Mental status is at baseline.     Gait: Gait abnormal.     Comments: Oriented to self, her room.   Psychiatric:        Mood and Affect: Mood normal.        Behavior: Behavior normal.     Labs reviewed: Recent Labs    04/16/23 0000  NA 142  K 3.7  CL 105  CO2 26*  BUN 20  CREATININE 0.8  CALCIUM 9.8   Recent Labs    04/16/23 0000  AST 16  ALT 16  ALKPHOS 154*  ALBUMIN 3.8   Recent Labs    04/16/23 0000  WBC 7.9  NEUTROABS 6,462.00  HGB 15.0  HCT 45  PLT 303   Lab Results  Component Value Date   TSH 1.28 09/04/2022   Lab Results  Component Value Date   HGBA1C 5.6 04/24/2016   Lab Results  Component Value Date   CHOL 189 09/06/2015   HDL 65 09/06/2015   LDLCALC 99 09/06/2015   TRIG 126 09/06/2015    Significant Diagnostic Results in last 30 days:  No results found.  Assessment/Plan  Adult failure to thrive  under hospice service for comfort measures. Gradual weight loss.   Dementia without behavioral disturbance (HCC) off Memantine for memory. MMSE 20/30 09/10/19. TSH 1.28 09/04/22  Fall  Hx of OA, s/p L TKR 2013,  on Tylenol, w/c for mobility              Gait abnormalit, w/c for mobility, risk  of falling, last fall 10/06/23 when the patient was found lying on floor beside of her bed, the bed was in lowest position  Gastric erosions GERD/PUD/gastric erosions, stable, on Omeprazole,  Hgb 15 04/16/23  Slow transit constipation stable, MiraLax daily.   Decubitus skin ulcer Sit bone R+L pressure ulcer, unstageable, treated with medihoney and calcium alginate   Family/ staff Communication: plan of care reviewed with the patient and charge nurse.   Labs/tests ordered:  none

## 2023-10-07 NOTE — Assessment & Plan Note (Signed)
off Memantine for memory. MMSE 20/30 09/10/19. TSH 1.28 09/04/22

## 2023-10-08 DIAGNOSIS — R1312 Dysphagia, oropharyngeal phase: Secondary | ICD-10-CM | POA: Diagnosis not present

## 2023-10-08 DIAGNOSIS — F0150 Vascular dementia without behavioral disturbance: Secondary | ICD-10-CM | POA: Diagnosis not present

## 2023-10-08 DIAGNOSIS — K5900 Constipation, unspecified: Secondary | ICD-10-CM | POA: Diagnosis not present

## 2023-10-08 DIAGNOSIS — I679 Cerebrovascular disease, unspecified: Secondary | ICD-10-CM | POA: Diagnosis not present

## 2023-10-08 DIAGNOSIS — R634 Abnormal weight loss: Secondary | ICD-10-CM | POA: Diagnosis not present

## 2023-10-08 DIAGNOSIS — K269 Duodenal ulcer, unspecified as acute or chronic, without hemorrhage or perforation: Secondary | ICD-10-CM | POA: Diagnosis not present

## 2023-10-09 DIAGNOSIS — I679 Cerebrovascular disease, unspecified: Secondary | ICD-10-CM | POA: Diagnosis not present

## 2023-10-09 DIAGNOSIS — K5900 Constipation, unspecified: Secondary | ICD-10-CM | POA: Diagnosis not present

## 2023-10-09 DIAGNOSIS — R1312 Dysphagia, oropharyngeal phase: Secondary | ICD-10-CM | POA: Diagnosis not present

## 2023-10-09 DIAGNOSIS — R634 Abnormal weight loss: Secondary | ICD-10-CM | POA: Diagnosis not present

## 2023-10-09 DIAGNOSIS — K269 Duodenal ulcer, unspecified as acute or chronic, without hemorrhage or perforation: Secondary | ICD-10-CM | POA: Diagnosis not present

## 2023-10-09 DIAGNOSIS — F0150 Vascular dementia without behavioral disturbance: Secondary | ICD-10-CM | POA: Diagnosis not present

## 2023-10-11 DIAGNOSIS — K5900 Constipation, unspecified: Secondary | ICD-10-CM | POA: Diagnosis not present

## 2023-10-11 DIAGNOSIS — I679 Cerebrovascular disease, unspecified: Secondary | ICD-10-CM | POA: Diagnosis not present

## 2023-10-11 DIAGNOSIS — F0150 Vascular dementia without behavioral disturbance: Secondary | ICD-10-CM | POA: Diagnosis not present

## 2023-10-11 DIAGNOSIS — R1312 Dysphagia, oropharyngeal phase: Secondary | ICD-10-CM | POA: Diagnosis not present

## 2023-10-11 DIAGNOSIS — R634 Abnormal weight loss: Secondary | ICD-10-CM | POA: Diagnosis not present

## 2023-10-11 DIAGNOSIS — K269 Duodenal ulcer, unspecified as acute or chronic, without hemorrhage or perforation: Secondary | ICD-10-CM | POA: Diagnosis not present

## 2023-10-15 DIAGNOSIS — R634 Abnormal weight loss: Secondary | ICD-10-CM | POA: Diagnosis not present

## 2023-10-15 DIAGNOSIS — I679 Cerebrovascular disease, unspecified: Secondary | ICD-10-CM | POA: Diagnosis not present

## 2023-10-15 DIAGNOSIS — K269 Duodenal ulcer, unspecified as acute or chronic, without hemorrhage or perforation: Secondary | ICD-10-CM | POA: Diagnosis not present

## 2023-10-15 DIAGNOSIS — F0150 Vascular dementia without behavioral disturbance: Secondary | ICD-10-CM | POA: Diagnosis not present

## 2023-10-15 DIAGNOSIS — K5900 Constipation, unspecified: Secondary | ICD-10-CM | POA: Diagnosis not present

## 2023-10-15 DIAGNOSIS — R1312 Dysphagia, oropharyngeal phase: Secondary | ICD-10-CM | POA: Diagnosis not present

## 2023-10-16 DIAGNOSIS — F0150 Vascular dementia without behavioral disturbance: Secondary | ICD-10-CM | POA: Diagnosis not present

## 2023-10-16 DIAGNOSIS — R1312 Dysphagia, oropharyngeal phase: Secondary | ICD-10-CM | POA: Diagnosis not present

## 2023-10-16 DIAGNOSIS — K5900 Constipation, unspecified: Secondary | ICD-10-CM | POA: Diagnosis not present

## 2023-10-16 DIAGNOSIS — I679 Cerebrovascular disease, unspecified: Secondary | ICD-10-CM | POA: Diagnosis not present

## 2023-10-16 DIAGNOSIS — R634 Abnormal weight loss: Secondary | ICD-10-CM | POA: Diagnosis not present

## 2023-10-16 DIAGNOSIS — K269 Duodenal ulcer, unspecified as acute or chronic, without hemorrhage or perforation: Secondary | ICD-10-CM | POA: Diagnosis not present

## 2023-10-17 DIAGNOSIS — L8990 Pressure ulcer of unspecified site, unspecified stage: Secondary | ICD-10-CM | POA: Insufficient documentation

## 2023-10-17 NOTE — Assessment & Plan Note (Signed)
Sit bone R+L pressure ulcer, unstageable, treated with medihoney and calcium alginate

## 2023-10-18 DIAGNOSIS — R1312 Dysphagia, oropharyngeal phase: Secondary | ICD-10-CM | POA: Diagnosis not present

## 2023-10-18 DIAGNOSIS — K5900 Constipation, unspecified: Secondary | ICD-10-CM | POA: Diagnosis not present

## 2023-10-18 DIAGNOSIS — K269 Duodenal ulcer, unspecified as acute or chronic, without hemorrhage or perforation: Secondary | ICD-10-CM | POA: Diagnosis not present

## 2023-10-18 DIAGNOSIS — I679 Cerebrovascular disease, unspecified: Secondary | ICD-10-CM | POA: Diagnosis not present

## 2023-10-18 DIAGNOSIS — R634 Abnormal weight loss: Secondary | ICD-10-CM | POA: Diagnosis not present

## 2023-10-18 DIAGNOSIS — F0150 Vascular dementia without behavioral disturbance: Secondary | ICD-10-CM | POA: Diagnosis not present

## 2023-10-22 DIAGNOSIS — I679 Cerebrovascular disease, unspecified: Secondary | ICD-10-CM | POA: Diagnosis not present

## 2023-10-22 DIAGNOSIS — N939 Abnormal uterine and vaginal bleeding, unspecified: Secondary | ICD-10-CM | POA: Diagnosis not present

## 2023-10-22 DIAGNOSIS — R1312 Dysphagia, oropharyngeal phase: Secondary | ICD-10-CM | POA: Diagnosis not present

## 2023-10-22 DIAGNOSIS — R634 Abnormal weight loss: Secondary | ICD-10-CM | POA: Diagnosis not present

## 2023-10-22 DIAGNOSIS — K269 Duodenal ulcer, unspecified as acute or chronic, without hemorrhage or perforation: Secondary | ICD-10-CM | POA: Diagnosis not present

## 2023-10-22 DIAGNOSIS — F0150 Vascular dementia without behavioral disturbance: Secondary | ICD-10-CM | POA: Diagnosis not present

## 2023-10-22 DIAGNOSIS — K5900 Constipation, unspecified: Secondary | ICD-10-CM | POA: Diagnosis not present

## 2023-10-23 DIAGNOSIS — F0150 Vascular dementia without behavioral disturbance: Secondary | ICD-10-CM | POA: Diagnosis not present

## 2023-10-23 DIAGNOSIS — R1312 Dysphagia, oropharyngeal phase: Secondary | ICD-10-CM | POA: Diagnosis not present

## 2023-10-23 DIAGNOSIS — R634 Abnormal weight loss: Secondary | ICD-10-CM | POA: Diagnosis not present

## 2023-10-23 DIAGNOSIS — K5900 Constipation, unspecified: Secondary | ICD-10-CM | POA: Diagnosis not present

## 2023-10-23 DIAGNOSIS — K269 Duodenal ulcer, unspecified as acute or chronic, without hemorrhage or perforation: Secondary | ICD-10-CM | POA: Diagnosis not present

## 2023-10-23 DIAGNOSIS — I679 Cerebrovascular disease, unspecified: Secondary | ICD-10-CM | POA: Diagnosis not present

## 2023-10-25 DIAGNOSIS — R634 Abnormal weight loss: Secondary | ICD-10-CM | POA: Diagnosis not present

## 2023-10-25 DIAGNOSIS — I679 Cerebrovascular disease, unspecified: Secondary | ICD-10-CM | POA: Diagnosis not present

## 2023-10-25 DIAGNOSIS — K269 Duodenal ulcer, unspecified as acute or chronic, without hemorrhage or perforation: Secondary | ICD-10-CM | POA: Diagnosis not present

## 2023-10-25 DIAGNOSIS — F0150 Vascular dementia without behavioral disturbance: Secondary | ICD-10-CM | POA: Diagnosis not present

## 2023-10-25 DIAGNOSIS — K5900 Constipation, unspecified: Secondary | ICD-10-CM | POA: Diagnosis not present

## 2023-10-25 DIAGNOSIS — R1312 Dysphagia, oropharyngeal phase: Secondary | ICD-10-CM | POA: Diagnosis not present

## 2023-10-26 DIAGNOSIS — R5383 Other fatigue: Secondary | ICD-10-CM | POA: Diagnosis not present

## 2023-10-26 DIAGNOSIS — R1312 Dysphagia, oropharyngeal phase: Secondary | ICD-10-CM | POA: Diagnosis not present

## 2023-10-26 DIAGNOSIS — M159 Polyosteoarthritis, unspecified: Secondary | ICD-10-CM | POA: Diagnosis not present

## 2023-10-26 DIAGNOSIS — R634 Abnormal weight loss: Secondary | ICD-10-CM | POA: Diagnosis not present

## 2023-10-26 DIAGNOSIS — F0150 Vascular dementia without behavioral disturbance: Secondary | ICD-10-CM | POA: Diagnosis not present

## 2023-10-26 DIAGNOSIS — K269 Duodenal ulcer, unspecified as acute or chronic, without hemorrhage or perforation: Secondary | ICD-10-CM | POA: Diagnosis not present

## 2023-10-26 DIAGNOSIS — I679 Cerebrovascular disease, unspecified: Secondary | ICD-10-CM | POA: Diagnosis not present

## 2023-10-26 DIAGNOSIS — K5900 Constipation, unspecified: Secondary | ICD-10-CM | POA: Diagnosis not present

## 2023-10-26 DIAGNOSIS — N3281 Overactive bladder: Secondary | ICD-10-CM | POA: Diagnosis not present

## 2023-10-26 DIAGNOSIS — K219 Gastro-esophageal reflux disease without esophagitis: Secondary | ICD-10-CM | POA: Diagnosis not present

## 2023-10-29 DIAGNOSIS — I679 Cerebrovascular disease, unspecified: Secondary | ICD-10-CM | POA: Diagnosis not present

## 2023-10-29 DIAGNOSIS — F0150 Vascular dementia without behavioral disturbance: Secondary | ICD-10-CM | POA: Diagnosis not present

## 2023-10-29 DIAGNOSIS — R1312 Dysphagia, oropharyngeal phase: Secondary | ICD-10-CM | POA: Diagnosis not present

## 2023-10-29 DIAGNOSIS — R634 Abnormal weight loss: Secondary | ICD-10-CM | POA: Diagnosis not present

## 2023-10-29 DIAGNOSIS — K5900 Constipation, unspecified: Secondary | ICD-10-CM | POA: Diagnosis not present

## 2023-10-29 DIAGNOSIS — K269 Duodenal ulcer, unspecified as acute or chronic, without hemorrhage or perforation: Secondary | ICD-10-CM | POA: Diagnosis not present

## 2023-10-30 DIAGNOSIS — R1312 Dysphagia, oropharyngeal phase: Secondary | ICD-10-CM | POA: Diagnosis not present

## 2023-10-30 DIAGNOSIS — R634 Abnormal weight loss: Secondary | ICD-10-CM | POA: Diagnosis not present

## 2023-10-30 DIAGNOSIS — K5900 Constipation, unspecified: Secondary | ICD-10-CM | POA: Diagnosis not present

## 2023-10-30 DIAGNOSIS — I679 Cerebrovascular disease, unspecified: Secondary | ICD-10-CM | POA: Diagnosis not present

## 2023-10-30 DIAGNOSIS — F0150 Vascular dementia without behavioral disturbance: Secondary | ICD-10-CM | POA: Diagnosis not present

## 2023-10-30 DIAGNOSIS — K269 Duodenal ulcer, unspecified as acute or chronic, without hemorrhage or perforation: Secondary | ICD-10-CM | POA: Diagnosis not present

## 2023-10-31 DIAGNOSIS — K269 Duodenal ulcer, unspecified as acute or chronic, without hemorrhage or perforation: Secondary | ICD-10-CM | POA: Diagnosis not present

## 2023-10-31 DIAGNOSIS — R634 Abnormal weight loss: Secondary | ICD-10-CM | POA: Diagnosis not present

## 2023-10-31 DIAGNOSIS — F0150 Vascular dementia without behavioral disturbance: Secondary | ICD-10-CM | POA: Diagnosis not present

## 2023-10-31 DIAGNOSIS — R1312 Dysphagia, oropharyngeal phase: Secondary | ICD-10-CM | POA: Diagnosis not present

## 2023-10-31 DIAGNOSIS — I679 Cerebrovascular disease, unspecified: Secondary | ICD-10-CM | POA: Diagnosis not present

## 2023-10-31 DIAGNOSIS — K5900 Constipation, unspecified: Secondary | ICD-10-CM | POA: Diagnosis not present

## 2023-11-26 DEATH — deceased
